# Patient Record
Sex: Male | Born: 1960 | Race: Black or African American | Hispanic: No | Smoking: Former smoker
Health system: Southern US, Community
[De-identification: ages and names within clinical notes are randomized; demographics above are authoritative.]

## PROBLEM LIST (undated history)

## (undated) DIAGNOSIS — F0390 Unspecified dementia without behavioral disturbance: Secondary | ICD-10-CM

## (undated) DIAGNOSIS — K219 Gastro-esophageal reflux disease without esophagitis: Secondary | ICD-10-CM

## (undated) DIAGNOSIS — R011 Cardiac murmur, unspecified: Secondary | ICD-10-CM

## (undated) DIAGNOSIS — I509 Heart failure, unspecified: Secondary | ICD-10-CM

## (undated) DIAGNOSIS — D649 Anemia, unspecified: Secondary | ICD-10-CM

## (undated) DIAGNOSIS — F419 Anxiety disorder, unspecified: Secondary | ICD-10-CM

## (undated) DIAGNOSIS — I639 Cerebral infarction, unspecified: Secondary | ICD-10-CM

## (undated) DIAGNOSIS — J189 Pneumonia, unspecified organism: Secondary | ICD-10-CM

## (undated) DIAGNOSIS — I10 Essential (primary) hypertension: Secondary | ICD-10-CM

## (undated) DIAGNOSIS — M1990 Unspecified osteoarthritis, unspecified site: Secondary | ICD-10-CM

## (undated) DIAGNOSIS — N189 Chronic kidney disease, unspecified: Secondary | ICD-10-CM

## (undated) DIAGNOSIS — E119 Type 2 diabetes mellitus without complications: Secondary | ICD-10-CM

## (undated) DIAGNOSIS — F32A Depression, unspecified: Secondary | ICD-10-CM

## (undated) HISTORY — PX: TONSILLECTOMY: SUR1361

## (undated) HISTORY — PX: COLONOSCOPY W/ POLYPECTOMY: SHX1380

## (undated) HISTORY — PX: OTHER SURGICAL HISTORY: SHX169

---

## 2006-01-29 ENCOUNTER — Other Ambulatory Visit: Payer: Self-pay

## 2006-01-29 ENCOUNTER — Inpatient Hospital Stay: Payer: Self-pay | Admitting: Internal Medicine

## 2006-12-03 ENCOUNTER — Ambulatory Visit: Payer: Self-pay | Admitting: Internal Medicine

## 2008-11-25 ENCOUNTER — Emergency Department: Payer: Self-pay

## 2009-01-13 ENCOUNTER — Ambulatory Visit: Payer: Self-pay

## 2009-11-06 DIAGNOSIS — I639 Cerebral infarction, unspecified: Secondary | ICD-10-CM | POA: Insufficient documentation
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
## 2014-08-01 ENCOUNTER — Emergency Department: Payer: Self-pay | Admitting: Emergency Medicine

## 2015-02-16 DIAGNOSIS — I619 Nontraumatic intracerebral hemorrhage, unspecified: Secondary | ICD-10-CM | POA: Insufficient documentation

## 2015-03-02 DIAGNOSIS — K222 Esophageal obstruction: Secondary | ICD-10-CM | POA: Insufficient documentation

## 2018-07-29 ENCOUNTER — Telehealth (INDEPENDENT_AMBULATORY_CARE_PROVIDER_SITE_OTHER): Payer: Self-pay | Admitting: Vascular Surgery

## 2021-12-09 ENCOUNTER — Observation Stay
Admission: EM | Admit: 2021-12-09 | Discharge: 2021-12-10 | Disposition: A | Discharge status: Home / Self Care | Payer: 59 | Attending: Family Medicine | Admitting: Family Medicine

## 2021-12-09 ENCOUNTER — Emergency Department: Payer: 59

## 2021-12-09 ENCOUNTER — Other Ambulatory Visit: Payer: Self-pay

## 2021-12-09 DIAGNOSIS — N1831 Chronic kidney disease, stage 3a: Secondary | ICD-10-CM

## 2021-12-09 DIAGNOSIS — I160 Hypertensive urgency: Secondary | ICD-10-CM

## 2021-12-09 DIAGNOSIS — G9341 Metabolic encephalopathy: Principal | ICD-10-CM

## 2021-12-09 DIAGNOSIS — F03918 Unspecified dementia, unspecified severity, with other behavioral disturbance: Secondary | ICD-10-CM

## 2021-12-09 DIAGNOSIS — F17210 Nicotine dependence, cigarettes, uncomplicated: Secondary | ICD-10-CM | POA: Diagnosis not present

## 2021-12-09 DIAGNOSIS — R778 Other specified abnormalities of plasma proteins: Secondary | ICD-10-CM

## 2021-12-09 DIAGNOSIS — I130 Hypertensive heart and chronic kidney disease with heart failure and stage 1 through stage 4 chronic kidney disease, or unspecified chronic kidney disease: Secondary | ICD-10-CM | POA: Insufficient documentation

## 2021-12-09 DIAGNOSIS — E1122 Type 2 diabetes mellitus with diabetic chronic kidney disease: Secondary | ICD-10-CM | POA: Diagnosis not present

## 2021-12-09 DIAGNOSIS — R1013 Epigastric pain: Secondary | ICD-10-CM | POA: Insufficient documentation

## 2021-12-09 DIAGNOSIS — R4182 Altered mental status, unspecified: Secondary | ICD-10-CM

## 2021-12-09 DIAGNOSIS — R748 Abnormal levels of other serum enzymes: Secondary | ICD-10-CM

## 2021-12-09 DIAGNOSIS — N390 Urinary tract infection, site not specified: Secondary | ICD-10-CM

## 2021-12-09 DIAGNOSIS — I5022 Chronic systolic (congestive) heart failure: Secondary | ICD-10-CM

## 2021-12-09 DIAGNOSIS — F0390 Unspecified dementia without behavioral disturbance: Secondary | ICD-10-CM

## 2021-12-09 DIAGNOSIS — Z20822 Contact with and (suspected) exposure to covid-19: Secondary | ICD-10-CM | POA: Insufficient documentation

## 2021-12-09 DIAGNOSIS — R7989 Other specified abnormal findings of blood chemistry: Secondary | ICD-10-CM

## 2021-12-09 DIAGNOSIS — R112 Nausea with vomiting, unspecified: Secondary | ICD-10-CM

## 2021-12-09 DIAGNOSIS — Z8673 Personal history of transient ischemic attack (TIA), and cerebral infarction without residual deficits: Secondary | ICD-10-CM

## 2021-12-09 HISTORY — DX: Hypertensive urgency: I16.0

## 2021-12-09 HISTORY — DX: Essential (primary) hypertension: I10

## 2021-12-09 HISTORY — DX: Type 2 diabetes mellitus without complications: E11.9

## 2021-12-09 HISTORY — DX: Unspecified dementia, unspecified severity, without behavioral disturbance, psychotic disturbance, mood disturbance, and anxiety: F03.90

## 2021-12-09 LAB — CBC WITH DIFFERENTIAL/PLATELET
Abs Immature Granulocytes: 0.05 10*3/uL (ref 0.00–0.07)
Basophils Absolute: 0.1 10*3/uL (ref 0.0–0.1)
Basophils Relative: 1 %
Eosinophils Absolute: 0 10*3/uL (ref 0.0–0.5)
Eosinophils Relative: 0 %
HCT: 41 % (ref 39.0–52.0)
Hemoglobin: 13.6 g/dL (ref 13.0–17.0)
Immature Granulocytes: 1 %
Lymphocytes Relative: 11 %
Lymphs Abs: 1.1 10*3/uL (ref 0.7–4.0)
MCH: 32.4 pg (ref 26.0–34.0)
MCHC: 33.2 g/dL (ref 30.0–36.0)
MCV: 97.6 fL (ref 80.0–100.0)
Monocytes Absolute: 0.5 10*3/uL (ref 0.1–1.0)
Monocytes Relative: 5 %
Neutro Abs: 8.7 10*3/uL — ABNORMAL HIGH (ref 1.7–7.7)
Neutrophils Relative %: 82 %
Platelets: 281 10*3/uL (ref 150–400)
RBC: 4.2 MIL/uL — ABNORMAL LOW (ref 4.22–5.81)
RDW: 12.7 % (ref 11.5–15.5)
WBC: 10.4 10*3/uL (ref 4.0–10.5)
nRBC: 0 % (ref 0.0–0.2)

## 2021-12-09 LAB — URINALYSIS, ROUTINE W REFLEX MICROSCOPIC
Bilirubin Urine: NEGATIVE
Glucose, UA: NEGATIVE mg/dL
Leukocytes,Ua: NEGATIVE
Nitrite: NEGATIVE
Protein, ur: >300 mg/dL — AB
Specific Gravity, Urine: 1.02 (ref 1.005–1.030)
pH: 7 (ref 5.0–8.0)

## 2021-12-09 LAB — COMPREHENSIVE METABOLIC PANEL
ALT: 10 U/L (ref 0–44)
AST: 17 U/L (ref 15–41)
Albumin: 4.2 g/dL (ref 3.5–5.0)
Alkaline Phosphatase: 78 U/L (ref 38–126)
Anion gap: 7 (ref 5–15)
BUN: 18 mg/dL (ref 6–20)
CO2: 27 mmol/L (ref 22–32)
Calcium: 9.3 mg/dL (ref 8.9–10.3)
Chloride: 103 mmol/L (ref 98–111)
Creatinine, Ser: 1.39 mg/dL — ABNORMAL HIGH (ref 0.61–1.24)
GFR, Estimated: 58 mL/min — ABNORMAL LOW (ref >60)
Glucose, Bld: 131 mg/dL — ABNORMAL HIGH (ref 70–99)
Potassium: 4.5 mmol/L (ref 3.5–5.1)
Sodium: 137 mmol/L (ref 135–145)
Total Bilirubin: 0.6 mg/dL (ref 0.3–1.2)
Total Protein: 8.4 g/dL — ABNORMAL HIGH (ref 6.5–8.1)

## 2021-12-09 LAB — RESP PANEL BY RT-PCR (FLU A&B, COVID) ARPGX2
Influenza A by PCR: NEGATIVE
Influenza B by PCR: NEGATIVE
SARS Coronavirus 2 by RT PCR: NEGATIVE

## 2021-12-09 LAB — URINALYSIS, MICROSCOPIC (REFLEX): RBC / HPF: >50 RBC/hpf (ref 0–5)

## 2021-12-09 LAB — HIV ANTIBODY (ROUTINE TESTING W REFLEX): HIV Screen 4th Generation wRfx: NONREACTIVE

## 2021-12-09 LAB — TROPONIN I (HIGH SENSITIVITY)
Troponin I (High Sensitivity): 29 ng/L — ABNORMAL HIGH (ref <18)
Troponin I (High Sensitivity): 30 ng/L — ABNORMAL HIGH (ref <18)

## 2021-12-09 LAB — LACTIC ACID, PLASMA
Lactic Acid, Venous: 0.9 mmol/L (ref 0.5–1.9)
Lactic Acid, Venous: 1.2 mmol/L (ref 0.5–1.9)

## 2021-12-09 LAB — LIPASE, BLOOD: Lipase: 59 U/L — ABNORMAL HIGH (ref 11–51)

## 2021-12-09 MED ORDER — SODIUM CHLORIDE 0.9 % IV SOLN
1.0000 g | INTRAVENOUS | Status: DC
  Administered 2021-12-09 – 2021-12-10 (×2): 1 g via INTRAVENOUS
  Filled 2021-12-09: qty 1
  Filled 2021-12-09: qty 10

## 2021-12-09 MED ORDER — ROSUVASTATIN CALCIUM 20 MG PO TABS
40.0000 mg | ORAL_TABLET | Freq: Every day | ORAL | Status: DC
  Administered 2021-12-09 – 2021-12-10 (×2): 40 mg via ORAL
  Filled 2021-12-09 (×2): qty 2

## 2021-12-09 MED ORDER — ACETAMINOPHEN 325 MG PO TABS: 650.0000 mg | ORAL_TABLET | Freq: Four times a day (QID) | ORAL | Status: DC | PRN

## 2021-12-09 MED ORDER — ENOXAPARIN SODIUM 40 MG/0.4ML IJ SOSY
40.0000 mg | PREFILLED_SYRINGE | INTRAMUSCULAR | Status: DC
  Administered 2021-12-09: 40 mg via SUBCUTANEOUS
  Filled 2021-12-09 (×2): qty 0.4

## 2021-12-09 MED ORDER — AMLODIPINE BESYLATE 10 MG PO TABS
10.0000 mg | ORAL_TABLET | Freq: Every day | ORAL | Status: DC
  Administered 2021-12-09 – 2021-12-10 (×2): 10 mg via ORAL
  Filled 2021-12-09 (×2): qty 1

## 2021-12-09 MED ORDER — ONDANSETRON HCL 4 MG PO TABS: 4.0000 mg | ORAL_TABLET | Freq: Four times a day (QID) | ORAL | Status: DC | PRN

## 2021-12-09 MED ORDER — TAMSULOSIN HCL 0.4 MG PO CAPS
0.4000 mg | ORAL_CAPSULE | Freq: Every day | ORAL | Status: DC
  Administered 2021-12-09 – 2021-12-10 (×2): 0.4 mg via ORAL
  Filled 2021-12-09 (×2): qty 1

## 2021-12-09 MED ORDER — MEMANTINE HCL 5 MG PO TABS
10.0000 mg | ORAL_TABLET | Freq: Two times a day (BID) | ORAL | Status: DC
  Administered 2021-12-09: 10 mg via ORAL
  Filled 2021-12-09 (×2): qty 2

## 2021-12-09 MED ORDER — ONDANSETRON HCL 4 MG/2ML IJ SOLN
4.0000 mg | Freq: Four times a day (QID) | INTRAMUSCULAR | Status: DC | PRN
  Administered 2021-12-09: 4 mg via INTRAVENOUS
  Filled 2021-12-09: qty 2

## 2021-12-09 MED ORDER — SODIUM CHLORIDE 0.9 % IV SOLN: INTRAVENOUS | Status: AC

## 2021-12-09 MED ORDER — HYDRALAZINE HCL 20 MG/ML IJ SOLN
10.0000 mg | Freq: Four times a day (QID) | INTRAMUSCULAR | Status: DC | PRN
  Administered 2021-12-09: 10 mg via INTRAVENOUS
  Filled 2021-12-09: qty 1

## 2021-12-09 MED ORDER — ASPIRIN EC 81 MG PO TBEC
81.0000 mg | DELAYED_RELEASE_TABLET | Freq: Every day | ORAL | Status: DC
  Administered 2021-12-09 – 2021-12-10 (×2): 81 mg via ORAL
  Filled 2021-12-09 (×2): qty 1

## 2021-12-09 MED ORDER — SERTRALINE HCL 50 MG PO TABS
100.0000 mg | ORAL_TABLET | Freq: Every day | ORAL | Status: DC
  Administered 2021-12-09 – 2021-12-10 (×2): 100 mg via ORAL
  Filled 2021-12-09 (×2): qty 2

## 2021-12-09 MED ORDER — SODIUM CHLORIDE 0.9 % IV BOLUS
500.0000 mL | Freq: Once | INTRAVENOUS | Status: AC
  Administered 2021-12-09: 500 mL via INTRAVENOUS

## 2021-12-09 MED ORDER — ACETAMINOPHEN 650 MG RE SUPP: 650.0000 mg | Freq: Four times a day (QID) | RECTAL | Status: DC | PRN

## 2021-12-09 NOTE — Assessment & Plan Note (Addendum)
--   Resolved.  Etiology unclear.  CT abdomen and pelvis unremarkable.  Mild elevation of lipase clinically insignificant.  May have been secondary to UTI.  Tolerating diet.

## 2021-12-09 NOTE — Assessment & Plan Note (Addendum)
--   Last echocardiogram on file 2021 in [place] showed recovered LVEF of 50%. --Continue Coreg, amlodipine.  Continue Entresto  -- Follows at [facility]

## 2021-12-09 NOTE — Assessment & Plan Note (Addendum)
--   Secondary to acute UTI, vomiting superimposed on suspected dementia and recently started on Namenda.  CT head no acute findings.  Per sister patient back to baseline.  No further evaluation suggested.

## 2021-12-09 NOTE — Plan of Care (Signed)
°  Problem: Clinical Measurements: Goal: Ability to maintain clinical measurements within normal limits will improve Outcome: Progressing   Problem: Clinical Measurements: Goal: Cardiovascular complication will be avoided Outcome: Progressing   Problem: Activity: Goal: Risk for activity intolerance will decrease Outcome: Progressing   Problem: Pain Managment: Goal: General experience of comfort will improve Outcome: Progressing   Problem: Safety: Goal: Ability to remain free from injury will improve Outcome: Progressing

## 2021-12-09 NOTE — Assessment & Plan Note (Addendum)
--   Reviewed last PCP note, blood pressure somewhat difficult to control in the setting of orthostatic hypotension, treated with abdominal binder, adherence to medications also question.  Blood pressure fairly well controlled here, no indication for further inpatient treatment.   --Continue amlodipine, Coreg, Entresto.  Sister plans to buy blood pressure cuff and follow as an outpatient.

## 2021-12-09 NOTE — ED Notes (Signed)
Hospitalist at the bedside 

## 2021-12-09 NOTE — Assessment & Plan Note (Addendum)
--   Cognitive decline noticed by PCP, started on Namenda with plan to titrate up to final dose of 10 mg twice daily.  Suspect dementia.  Follow-up as an outpatient.

## 2021-12-09 NOTE — Assessment & Plan Note (Addendum)
--  Continue aspirin and rosuvastatin

## 2021-12-09 NOTE — Assessment & Plan Note (Addendum)
Uncontrolled Hydralazine IV as needed until tolerating orally

## 2021-12-09 NOTE — Assessment & Plan Note (Addendum)
--   Presumably secondary to vomiting.  No repeat indicated at this point as the patient is asymptomatic and examined tolerating diet.  Imaging was unremarkable, no evidence of pancreatitis.

## 2021-12-09 NOTE — Assessment & Plan Note (Addendum)
--   Minimally elevated, EKG nonacute.  Asymptomatic.  Of note elevated on last check in the emergency department per care everywhere.  May be chronic elevation.  No signs or symptoms to suggest ACS.

## 2021-12-09 NOTE — ED Provider Notes (Signed)
[facility] Provider Note    Event Date/Time   First MD Initiated Contact with Patient 12/09/21 0107     (approximate)   History   Emesis and Altered Mental Status   HPI  Level V caveat: Limited by dementia  Randall [name] is a 61 y.o. male brought to the ED via EMS from home with a chief complaint of emesis and altered mentation.  Patient with a history of dementia.  Wife made him an egg sandwich and became concerned when he vomited and began to "talk out of his head".  Rest of history is unobtainable secondary to patient's dementia as he denies all complaints.     Past Medical History  1. Chronic systolic heart failure (CMS-HCC)  2. Essential hypertension (RAF-HCC)  3. Cognitive decline  4. Frailty  5. Lower urinary tract symptoms  6. Orthostasis    Active Problem List   Patient Active Problem List   Diagnosis Date Noted   Dementia ([place]) 24/26/8341   Acute metabolic encephalopathy 96/22/2979   Nausea and vomiting 12/09/2021   Elevated troponin 89/21/1941   Chronic systolic CHF (congestive heart failure) ([place]) 12/09/2021   History of CVA (cerebrovascular accident) 12/09/2021   Stage 3a chronic kidney disease ([place]) 12/09/2021   Elevated lipase 12/09/2021   Hypertensive urgency 12/09/2021     Past Surgical History     Home Medications   Prior to Admission medications   Not on File     Allergies  Patient has no known allergies.   Family History  History reviewed. No pertinent family history.   Physical Exam  Triage Vital Signs: ED Triage Vitals  Enc Vitals Group     BP      Pulse      Resp      Temp      Temp src      SpO2      Weight      Height      Head Circumference      Peak Flow      Pain Score      Pain Loc      Pain Edu?      Excl. in [place]?     Updated Vital Signs: BP (!) 156/83    Pulse 71    Temp 98.3 F (36.8 C) (Oral)    Resp 18    Ht 6' (1.829 m)    Wt 74.9 kg    SpO2 98%    BMI 22.39 kg/m     General: Awake, no distress.  CV:  RRR.  Good peripheral perfusion.  Resp:  Normal effort.  CTAB. Abd:  Mildly tender to epigastrium without rebound or guarding no distention.  Other:  Alert and oriented to name.  CN II to XII grossly intact. MAEx4.   ED Results / Procedures / Treatments  Labs (all labs ordered are listed, but only abnormal results are displayed) Labs Reviewed  CBC WITH DIFFERENTIAL/PLATELET - Abnormal; Notable for the following components:      Result Value   RBC 4.20 (*)    Neutro Abs 8.7 (*)    All other components within normal limits  COMPREHENSIVE METABOLIC PANEL - Abnormal; Notable for the following components:   Glucose, Bld 131 (*)    Creatinine, Ser 1.39 (*)    Total Protein 8.4 (*)    GFR, Estimated 58 (*)    All other components within normal limits  LIPASE, BLOOD - Abnormal; Notable for the  following components:   Lipase 59 (*)    All other components within normal limits  URINALYSIS, ROUTINE W REFLEX MICROSCOPIC - Abnormal; Notable for the following components:   Color, Urine AMBER (*)    Hgb urine dipstick SMALL (*)    Ketones, ur TRACE (*)    Protein, ur >300 (*)    All other components within normal limits  URINALYSIS, MICROSCOPIC (REFLEX) - Abnormal; Notable for the following components:   Bacteria, UA MANY (*)    All other components within normal limits  TROPONIN I (HIGH SENSITIVITY) - Abnormal; Notable for the following components:   Troponin I (High Sensitivity) 29 (*)    All other components within normal limits  TROPONIN I (HIGH SENSITIVITY) - Abnormal; Notable for the following components:   Troponin I (High Sensitivity) 30 (*)    All other components within normal limits  RESP PANEL BY RT-PCR (FLU A&B, COVID) ARPGX2  CULTURE, BLOOD (ROUTINE X 2)  CULTURE, BLOOD (ROUTINE X 2)  URINE CULTURE  LACTIC ACID, PLASMA  LACTIC ACID, PLASMA  HIV ANTIBODY (ROUTINE TESTING W REFLEX)     EKG  ED ECG REPORT I, Kentaro Alewine J, the  attending physician, personally viewed and interpreted this ECG.   Date: 12/09/2021  EKG Time: 0114  Rate: 62  Rhythm: normal sinus rhythm  Axis: Normal  Intervals:none  ST&T Change: Nonspecific    RADIOLOGY I have personally reviewed patient's CT head, CT abdomen pelvis and chest x-ray as well as the radiology interpretation:  CT head: No ICH  CT abdomen pelvis: No acute abnormality  Chest x-ray: No acute cardiopulmonary process  Official radiology report(s): CT Abdomen Pelvis Wo Contrast  Result Date: 12/09/2021 CLINICAL DATA:  Nausea and vomiting EXAM: CT ABDOMEN AND PELVIS WITHOUT CONTRAST TECHNIQUE: Multidetector CT imaging of the abdomen and pelvis was performed following the standard protocol without IV contrast. RADIATION DOSE REDUCTION: This exam was performed according to the departmental dose-optimization program which includes automated exposure control, adjustment of the mA and/or kV according to patient size and/or use of iterative reconstruction technique. COMPARISON:  None FINDINGS: Lower chest: No acute abnormality. Hepatobiliary: No focal liver abnormality is seen. No gallstones, gallbladder wall thickening, or biliary dilatation. Pancreas: Unremarkable. No pancreatic ductal dilatation or surrounding inflammatory changes. Spleen: Normal in size without focal abnormality. Adrenals/Urinary Tract: Mild hyperplasia of the adrenal glands is noted. The kidneys are well visualize without renal calculi or urinary tract obstructive changes. The bladder is well distended. Stomach/Bowel: Scattered fecal material is noted throughout the colon. No obstructive changes are seen. The appendix is not well appreciated. No inflammatory changes to suggest appendicitis are noted. Small bowel and stomach are within normal limits. Vascular/Lymphatic: Aortic atherosclerosis. No enlarged abdominal or pelvic lymph nodes. Reproductive: Prostate is unremarkable. Large left hydrocele is noted. Other: No  abdominal wall hernia or abnormality. No abdominopelvic ascites. Musculoskeletal: Bony structures show no acute abnormality. Degenerative changes of lumbar spine are noted. IMPRESSION: No acute abnormality to correspond with the given clinical history. Large left hydrocele. Electronically Signed   By: Inez Catalina M.D.   On: 12/09/2021 02:47   CT Head Wo Contrast  Result Date: 12/09/2021 CLINICAL DATA:  Vomiting and altered mental status, initial encounter EXAM: CT HEAD WITHOUT CONTRAST TECHNIQUE: Contiguous axial images were obtained from the base of the skull through the vertex without intravenous contrast. RADIATION DOSE REDUCTION: This exam was performed according to the departmental dose-optimization program which includes automated exposure control, adjustment of the mA and/or kV  according to patient size and/or use of iterative reconstruction technique. COMPARISON:  None. FINDINGS: Brain: No evidence of acute infarction, hemorrhage, hydrocephalus, extra-axial collection or mass lesion/mass effect. Chronic white matter ischemic changes are noted. Vascular: No hyperdense vessel or unexpected calcification. Skull: Normal. Negative for fracture or focal lesion. Sinuses/Orbits: No acute finding. Other: None. IMPRESSION: Chronic white matter ischemic changes without acute abnormality. Electronically Signed   By: Inez Catalina M.D.   On: 12/09/2021 02:27   DG Chest Port 1 View  Result Date: 12/09/2021 CLINICAL DATA:  Altered mental status, weakness EXAM: PORTABLE CHEST 1 VIEW COMPARISON:  01/29/2006 FINDINGS: Lungs are clear.  No pleural effusion or pneumothorax. The heart is normal in size. IMPRESSION: No evidence of acute cardiopulmonary disease. Electronically Signed   By: Julian Hy M.D.   On: 12/09/2021 01:48     PROCEDURES:  Critical Care performed: No  .1-3 Lead EKG Interpretation Performed by: Paulette Blanch, MD Authorized by: Paulette Blanch, MD     Interpretation: normal     ECG rate:   60   ECG rate assessment: normal     Rhythm: sinus rhythm     Ectopy: none     Conduction: normal   Comments:     Patient placed on cardiac monitor to evaluate for arrhythmias   MEDICATIONS ORDERED IN ED: Medications  hydrALAZINE (APRESOLINE) injection 10 mg (10 mg Intravenous Given 12/09/21 0528)  enoxaparin (LOVENOX) injection 40 mg (has no administration in time range)  0.9 %  sodium chloride infusion ( Intravenous New Bag/Given 12/09/21 0636)  acetaminophen (TYLENOL) tablet 650 mg (has no administration in time range)    Or  acetaminophen (TYLENOL) suppository 650 mg (has no administration in time range)  ondansetron (ZOFRAN) tablet 4 mg ( Oral See Alternative 12/09/21 1517)    Or  ondansetron (ZOFRAN) injection 4 mg (4 mg Intravenous Given 12/09/21 0628)  sodium chloride 0.9 % bolus 500 mL (0 mLs Intravenous Stopped 12/09/21 0251)     IMPRESSION / MDM / ASSESSMENT AND PLAN / ED COURSE  I reviewed the triage vital signs and the nursing notes.                             61 year old male presenting with emesis and altered mental status. Differential diagnosis includes, but is not limited to, alcohol, illicit or prescription medications, or other toxic ingestion; intracranial pathology such as stroke or intracerebral hemorrhage; fever or infectious causes including sepsis; hypoxemia and/or hypercarbia; uremia; trauma; endocrine related disorders such as diabetes, hypoglycemia, and thyroid-related diseases; hypertensive encephalopathy; etc. I have personally reviewed patient's records and see that he had a PCP office visit yesterday 12/08/2021 which notes he stopped spironolactone 06/2021 for dizziness, home safety eval with PT/OT ordered given frailty, referred to neuropsych testing for cognitive decline, started on Namenda 5 mg bid  The patient is on the cardiac monitor to evaluate for evidence of arrhythmia and/or significant heart rate changes.  We will obtain sepsis work-up including  troponin, CT head for altered mental status, CT abdomen/pelvis for emesis, chest x-ray to evaluate pneumonia.  Will reassess.  Clinical Course as of 12/09/21 6160  Fri Dec 09, 2021  0303 Wife at bedside who states patient is still more confused from his baseline.  Reports he had a scheduled PCP follow-up yesterday where they adjusted some of his medications.  Came home and she fixed him a bologna and tried cheese sandwich.  Approximately  1 hour later he began to vomit.  Subsequently he seemed confused and talking out of his head.  Updated them of laboratory and imaging results which are notable for normal WBC 10.4, creatinine 1.39 without prior for comparison, mildly elevated lipase 59, negative respiratory panel, negative lactic acid, mildly elevated troponin which is likely secondary to demand ischemia.  CT head, abdomen/pelvis and chest x-ray unremarkable.  Will obtain in/out catheter urine.  Will consult hospitalist services for evaluation and admission for altered mental status. [JS]    Clinical Course User Index [JS] Paulette Blanch, MD     FINAL CLINICAL IMPRESSION(S) / ED DIAGNOSES   Final diagnoses:  Altered mental status, unspecified altered mental status type  Nausea and vomiting, unspecified vomiting type     Rx / DC Orders   ED Discharge Orders     None        Note:  This document was prepared using Dragon voice recognition software and may include unintentional dictation errors.   Paulette Blanch, MD 12/09/21 [phone]

## 2021-12-09 NOTE — H&P (Signed)
History and Physical    Patient: Randall [name]:[id number] DOB: [birth year] DOA: 12/09/2021 DOS: the patient was seen and examined on 12/09/2021 PCP: System, Provider Not In  Patient coming from: Home  Chief Complaint:  Chief Complaint  Patient presents with   Emesis   Altered Mental Status    HPI: Randall [name] is a 61 y.o. male with medical history significant of Dementia, HTN, systolic heart failure with improved EF to 50%, CKD 3a and history of CVA who presents to the ED with a complaint of vomiting and talking out of his head.  Most of the history is given by his sister and caregiver at the bedside who states that the symptoms started after eating a bologna sandwich that she prepared.  She stated that after he ate the sandwich she left the house for about an hour and when she returned he was in the bathroom vomiting and he was speaking incomprehensibly which is not usual for him even with dementia.  Vomiting was nonbloody and nonbilious.  No affected contacts.  He was not previously unwell and saw his PCP the day prior.  She states he was tested for COVID and flu because she mentioned nonacute been sneezing for couple days both test were negative.  Patient denies chest pain, shortness of breath, abdominal pain or diarrhea  ED course: BP 180/84 with otherwise normal vitals Labs: CBC unremarkable, lactic acid 0.9 CMP with creatinine 1.39 which is baseline.  LFTs normal, lipase 59 Troponin 29 COVID and flu negative Urinalysis pending  EKG, personally reviewed and interpreted: Sinus at 62 with nonspecific ST-T wave changes  Imaging CT abdomen and pelvis large left hydrocele but otherwise no abnormalities Chest x-ray clear CT head nonacute  Patient given an IV fluid bolus.  Blood and urine cultures sent of.  Hospitalist consulted for admission.    Review of Systems: As mentioned in the history of present illness. All other systems reviewed and are negative. Past Medical  History:  Diagnosis Date   Dementia ([place])    Diabetes mellitus without complication ([place])    Hypertension    History reviewed. No pertinent surgical history. Social History:  reports that he has been smoking cigarettes. He has been smoking an average of .5 packs per day. He does not have any smokeless tobacco history on file. He reports that he does not drink alcohol and does not use drugs.  No Known Allergies  History reviewed. No pertinent family history.  Prior to Admission medications   Not on File    Physical Exam: Vitals:   12/09/21 0119 12/09/21 0130 12/09/21 0230 12/09/21 0300  BP:  (!) 196/97 (!) 178/87 (!) 180/84  Pulse:   61 61  Resp:  13 15 13   Temp:      TempSrc:      SpO2:   100% 100%  Weight: 95.3 kg     Height: 6' (1.829 m)       Physical Exam Constitutional:      General: He is not in acute distress.    Appearance: Normal appearance. He is not ill-appearing.  HENT:     Head: Normocephalic and atraumatic.  Cardiovascular:     Rate and Rhythm: Normal rate and regular rhythm.  Pulmonary:     Effort: Pulmonary effort is normal.     Breath sounds: Normal breath sounds.  Abdominal:     General: Abdomen is flat. Bowel sounds are normal.     Palpations: Abdomen is soft.  Musculoskeletal:  Cervical back: Normal range of motion and neck supple.  Skin:    General: Skin is warm and dry.  Neurological:     General: No focal deficit present.     Mental Status: Mental status is at baseline.  Psychiatric:        Mood and Affect: Mood normal.        Behavior: Behavior normal.     Data Reviewed: Vitals, labs, EKG, imaging ED provider and triage notes Past cardiology visit Notes       Assessment and Plan: * Acute metabolic encephalopathy Secondary to illness superimposed on baseline dementia Head CT with no acute findings Neurologic checks Fall and aspiration precautions   Hypertensive urgency Hydralazine IV as needed Resume oral meds as  tolerated.  On amlodipine, Coreg, Entresto  Elevated lipase Possibly reactive to vomiting Continue to trend  Nausea and vomiting Mildly elevated lipase with otherwise no abnormal findings Follow urinalysis Possible mild viral illness, possible food related illness IV hydration IV antiemetics  Elevated troponin Patient denies chest pain and EKG nonacute Likely related to demand from vomiting Continue to trend  Stage 3a chronic kidney disease ([place]) Renal function at baseline  History of CVA (cerebrovascular accident) Continue aspirin and rosuvastatin  Chronic systolic CHF (congestive heart failure) ([place]) Compensated Monitor for worsening IV hydration Continue Coreg, amlodipine.  Continue Entresto when verified [facility]  Dementia [facility]) Patient not currently on medication for dementia Delirium precautions       Advance Care Planning:   Code Status: Not on file full code  Consults: None  Family Communication: Sister at bedside  Severity of Illness: The appropriate patient status for this patient is OBSERVATION. Observation status is judged to be reasonable and necessary in order to provide the required intensity of service to ensure the patient's safety. The patient's presenting symptoms, physical exam findings, and initial radiographic and laboratory data in the context of their medical condition is felt to place them at decreased risk for further clinical deterioration. Furthermore, it is anticipated that the patient will be medically stable for discharge from the hospital within 2 midnights of admission.   Author: Athena Masse, MD 12/09/2021 3:38 AM  For on call review [email or web address].

## 2021-12-09 NOTE — ED Triage Notes (Signed)
Patient arrives via EMS from home with c/c of vomiting and altered mental status. Sister states he ate a bologna sandwich and then vomited forcefully. She also states that he has been "talking out of his head" since yesterday. Pt has a HX of Dementia. No active vomiting during triage. Pt does have difficulty getting his words out, but sister states this is not a new problem. Pt follows commands, although it is difficult to understand some of what he is saying. No other complaints.

## 2021-12-09 NOTE — Progress Notes (Signed)
EZZARD DITMER is a 61 y.o. male with medical history significant of Dementia, HTN, systolic heart failure with improved EF to 50%, CKD 3a and history of CVA who presents to the ED with a complaint of vomiting and talking out of his head.  Most of the history is given by his sister and caregiver at the bedside who states that the symptoms started after eating a bologna sandwich that she prepared.  She stated that after he ate the sandwich she left the house for about an hour and when she returned he was in the bathroom vomiting and he was speaking incomprehensibly which is not usual for him even with dementia.  Vomiting was nonbloody and nonbilious.  No affected contacts.  He was not previously unwell and saw his PCP the day prior.  She states he was tested for COVID and flu because she mentioned nonacute been sneezing for couple days both test were negative.  Patient denies chest pain, shortness of breath, abdominal pain or diarrhea.  12/09/2021: Patient was seen and examined at his bedside.  He is alert and oriented x1, to self only.  He is pleasantly confused and has nonsensical speech.  CT head was negative for any acute intracranial findings.  UA positive for pyuria, urine culture in process, started Rocephin empirically due to altered mental status.  Please refer to H&P dictated by my partner Dr. Damita Dunnings on 12/09/2021 for further details of the assessment and plan.

## 2021-12-09 NOTE — Assessment & Plan Note (Addendum)
--  Renal function at baseline --1.39 on admission, 1.62 today.  Baseline approximately 1.5-1.7 with most recent study 08/18/2021.

## 2021-12-09 NOTE — Progress Notes (Addendum)
Pt was admitted on the floor with no sign of distress. Pt only alert to self. Pt screening not completed due to confusion. VSS except BP 178/95 HR 72. Hydralazine 10 mg IV PRN administered. Will notify incoming shift. Will continue to monitor.

## 2021-12-10 DIAGNOSIS — F03A0 Unspecified dementia, mild, without behavioral disturbance, psychotic disturbance, mood disturbance, and anxiety: Secondary | ICD-10-CM

## 2021-12-10 DIAGNOSIS — N3001 Acute cystitis with hematuria: Secondary | ICD-10-CM

## 2021-12-10 DIAGNOSIS — R112 Nausea with vomiting, unspecified: Secondary | ICD-10-CM

## 2021-12-10 DIAGNOSIS — N390 Urinary tract infection, site not specified: Secondary | ICD-10-CM

## 2021-12-10 DIAGNOSIS — N1831 Chronic kidney disease, stage 3a: Secondary | ICD-10-CM

## 2021-12-10 LAB — CBC
HCT: 38.2 % — ABNORMAL LOW (ref 39.0–52.0)
Hemoglobin: 12.7 g/dL — ABNORMAL LOW (ref 13.0–17.0)
MCH: 31.5 pg (ref 26.0–34.0)
MCHC: 33.2 g/dL (ref 30.0–36.0)
MCV: 94.8 fL (ref 80.0–100.0)
Platelets: 253 10*3/uL (ref 150–400)
RBC: 4.03 MIL/uL — ABNORMAL LOW (ref 4.22–5.81)
RDW: 12.5 % (ref 11.5–15.5)
WBC: 7.4 10*3/uL (ref 4.0–10.5)
nRBC: 0 % (ref 0.0–0.2)

## 2021-12-10 LAB — COMPREHENSIVE METABOLIC PANEL
ALT: 8 U/L (ref 0–44)
AST: 14 U/L — ABNORMAL LOW (ref 15–41)
Albumin: 3.3 g/dL — ABNORMAL LOW (ref 3.5–5.0)
Alkaline Phosphatase: 63 U/L (ref 38–126)
Anion gap: 6 (ref 5–15)
BUN: 19 mg/dL (ref 6–20)
CO2: 25 mmol/L (ref 22–32)
Calcium: 8.7 mg/dL — ABNORMAL LOW (ref 8.9–10.3)
Chloride: 104 mmol/L (ref 98–111)
Creatinine, Ser: 1.62 mg/dL — ABNORMAL HIGH (ref 0.61–1.24)
GFR, Estimated: 48 mL/min — ABNORMAL LOW (ref >60)
Glucose, Bld: 83 mg/dL (ref 70–99)
Potassium: 3.3 mmol/L — ABNORMAL LOW (ref 3.5–5.1)
Sodium: 135 mmol/L (ref 135–145)
Total Bilirubin: 0.6 mg/dL (ref 0.3–1.2)
Total Protein: 6.8 g/dL (ref 6.5–8.1)

## 2021-12-10 LAB — URINE CULTURE: Culture: 30000 — AB

## 2021-12-10 LAB — PHOSPHORUS: Phosphorus: 2.8 mg/dL (ref 2.5–4.6)

## 2021-12-10 LAB — MAGNESIUM: Magnesium: 2 mg/dL (ref 1.7–2.4)

## 2021-12-10 MED ORDER — MEMANTINE HCL 5 MG PO TABS: ORAL_TABLET | ORAL | 0 refills | Status: DC

## 2021-12-10 MED ORDER — POTASSIUM CHLORIDE CRYS ER 20 MEQ PO TBCR
40.0000 meq | EXTENDED_RELEASE_TABLET | Freq: Once | ORAL | Status: AC
Start: 2021-12-10 — End: 2021-12-10
  Administered 2021-12-10: 10:00:00 40 meq via ORAL
  Filled 2021-12-10: qty 2

## 2021-12-10 MED ORDER — CEFUROXIME AXETIL 500 MG PO TABS: 500.0000 mg | ORAL_TABLET | Freq: Two times a day (BID) | ORAL | 0 refills | Status: DC

## 2021-12-10 NOTE — Discharge Summary (Addendum)
Physician Discharge Summary   Patient: Randall [name] MRN: [id number] DOB: 18-Jun-1961  Admit date:     12/09/2021  Discharge date: 12/10/21  Discharge Physician: Murray Hodgkins   PCP: Marlowe Aschoff, MD   Recommendations at discharge:   Follow-up resolution of UTI  Discharge Diagnoses: Principal Problem:   Acute metabolic encephalopathy Active Problems:   UTI (urinary tract infection)   Dementia (HCC)   Nausea and vomiting   Elevated troponin   Chronic systolic CHF (congestive heart failure) (HCC)   History of CVA (cerebrovascular accident)   Stage 3a chronic kidney disease (HCC)   Elevated lipase   Hypertensive urgency  Resolved Problems:   * No resolved hospital problems. *  Hospital Course: 61 year old man presented with acute confusion and emesis superimposed on dementia.  Sister had noted he had been confused since the doctor's office visit earlier in the day.  Admitted for acute metabolic encephalopathy, further history revealed increased urinary frequency and testing suggested UTI.  Treated for symptomatic UTI with clinical resolution of encephalopathy.  Back to baseline today and stable for discharge home.  Assessment and Plan: * Acute metabolic encephalopathy -- Secondary to acute UTI, vomiting superimposed on suspected dementia and recently started on Namenda.  CT head no acute findings.  Per sister patient back to baseline.  No further evaluation suggested.    UTI (urinary tract infection) -- Clinically resolved.  Increased urinary frequency recently thought to be BPH.  Complete 3-day course of antibiotic.  Hypertensive urgency -- Reviewed last PCP note, blood pressure somewhat difficult to control in the setting of orthostatic hypotension, treated with abdominal binder, adherence to medications also question.  Blood pressure fairly well controlled here, no indication for further inpatient treatment.   --Continue amlodipine, Coreg, Entresto.  Sister  plans to buy blood pressure cuff and follow as an outpatient.  Elevated lipase -- Presumably secondary to vomiting.  No repeat indicated at this point as the patient is asymptomatic and examined tolerating diet.  Imaging was unremarkable, no evidence of pancreatitis.  Stage 3a chronic kidney disease ([place]) --Renal function at baseline --1.39 on admission, 1.62 today.  Baseline approximately 1.5-1.7 with most recent study 08/18/2021.  History of CVA (cerebrovascular accident) --Continue aspirin and rosuvastatin  Chronic systolic CHF (congestive heart failure) ([place]) -- Last echocardiogram on file 2021 in [place] showed recovered LVEF of 50%. --Continue Coreg, amlodipine.  Continue Entresto  -- Follows at [facility]  Elevated troponin -- Minimally elevated, EKG nonacute.  Asymptomatic.  Of note elevated on last check in the emergency department per care everywhere.  May be chronic elevation.  No signs or symptoms to suggest ACS.   Nausea and vomiting -- Resolved.  Etiology unclear.  CT abdomen and pelvis unremarkable.  Mild elevation of lipase clinically insignificant.  May have been secondary to UTI.  Tolerating diet.  Dementia ([place]) -- Cognitive decline noticed by PCP, started on Namenda with plan to titrate up to final dose of 10 mg twice daily.  Suspect dementia.  Follow-up as an outpatient.           Consultants: none  Procedures performed: none  Disposition: Home Diet recommendation:  Discharge Diet Orders (From admission, onward)     Start     Ordered   12/10/21 0000  Diet - low sodium heart healthy        12/10/21 0943           Regular diet  DISCHARGE MEDICATION: Allergies as of 12/10/2021  Reactions   Penicillins    Reaction in childhood        Medication List     STOP taking these medications    lisinopril 40 MG tablet Commonly known as: ZESTRIL       TAKE these medications    amLODipine 10 MG tablet Commonly known as: NORVASC Take  10 mg by mouth daily.   aspirin 81 MG EC tablet Take 81 mg by mouth daily.   carvedilol 25 MG tablet Commonly known as: COREG Take 25 mg by mouth 2 (two) times daily.   cefUROXime 500 MG tablet Commonly known as: CEFTIN Take 1 tablet (500 mg total) by mouth 2 (two) times daily with a meal. Start 2/5 AM   memantine 5 MG tablet Commonly known as: NAMENDA Take as directed by your doctor. What changed:  medication strength how much to take how to take this when to take this additional instructions   rosuvastatin 40 MG tablet Commonly known as: CRESTOR Take 40 mg by mouth daily.   sacubitril-valsartan 97-103 MG Commonly known as: ENTRESTO Take 1 tablet by mouth 2 (two) times daily.   sertraline 100 MG tablet Commonly known as: ZOLOFT Take 100 mg by mouth daily.   tamsulosin 0.4 MG Caps capsule Commonly known as: FLOMAX Take 0.4 mg by mouth daily.        Follow-up Information     Pavlovich, Jae Dire, MD. Schedule an appointment as soon as possible for a visit in 1 week(s).   Specialty: Internal Medicine Contact information: [address] [place] [phone]                1 episode of vomiting yesterday but did tolerate dinner last night.  Sister feels like he is back to baseline today.  No abdominal pain or vomiting.  Chronic left shoulder pain. Discharge Exam: Filed Weights   12/09/21 0119 12/09/21 0451  Weight: 95.3 kg 74.9 kg   Physical Exam Constitutional:      General: He is not in acute distress.    Appearance: He is not ill-appearing or toxic-appearing.  Cardiovascular:     Rate and Rhythm: Normal rate and regular rhythm.     Heart sounds: No murmur heard. Pulmonary:     Effort: Pulmonary effort is normal. No respiratory distress.     Breath sounds: No wheezing, rhonchi or rales.  Neurological:     Mental Status: He is alert.  Psychiatric:        Mood and Affect: Mood normal.        Behavior: Behavior normal.   Alert, answers questions appropriately   Condition at discharge: good  The results of significant diagnostics from this hospitalization (including imaging, microbiology, ancillary and laboratory) are listed below for reference.   Imaging Studies: CT Abdomen Pelvis Wo Contrast  Result Date: 12/09/2021 CLINICAL DATA:  Nausea and vomiting EXAM: CT ABDOMEN AND PELVIS WITHOUT CONTRAST TECHNIQUE: Multidetector CT imaging of the abdomen and pelvis was performed following the standard protocol without IV contrast. RADIATION DOSE REDUCTION: This exam was performed according to the departmental dose-optimization program which includes automated exposure control, adjustment of the mA and/or kV according to patient size and/or use of iterative reconstruction technique. COMPARISON:  None FINDINGS: Lower chest: No acute abnormality. Hepatobiliary: No focal liver abnormality is seen. No gallstones, gallbladder wall thickening, or biliary dilatation. Pancreas: Unremarkable. No pancreatic ductal dilatation or surrounding inflammatory changes. Spleen: Normal in size without focal abnormality. Adrenals/Urinary Tract: Mild hyperplasia of  the adrenal glands is noted. The kidneys are well visualize without renal calculi or urinary tract obstructive changes. The bladder is well distended. Stomach/Bowel: Scattered fecal material is noted throughout the colon. No obstructive changes are seen. The appendix is not well appreciated. No inflammatory changes to suggest appendicitis are noted. Small bowel and stomach are within normal limits. Vascular/Lymphatic: Aortic atherosclerosis. No enlarged abdominal or pelvic lymph nodes. Reproductive: Prostate is unremarkable. Large left hydrocele is noted. Other: No abdominal wall hernia or abnormality. No abdominopelvic ascites. Musculoskeletal: Bony structures show no acute abnormality. Degenerative changes of lumbar spine are noted. IMPRESSION: No acute abnormality to correspond with the  given clinical history. Large left hydrocele. Electronically Signed   By: Inez Catalina M.D.   On: 12/09/2021 02:47   CT Head Wo Contrast  Result Date: 12/09/2021 CLINICAL DATA:  Vomiting and altered mental status, initial encounter EXAM: CT HEAD WITHOUT CONTRAST TECHNIQUE: Contiguous axial images were obtained from the base of the skull through the vertex without intravenous contrast. RADIATION DOSE REDUCTION: This exam was performed according to the departmental dose-optimization program which includes automated exposure control, adjustment of the mA and/or kV according to patient size and/or use of iterative reconstruction technique. COMPARISON:  None. FINDINGS: Brain: No evidence of acute infarction, hemorrhage, hydrocephalus, extra-axial collection or mass lesion/mass effect. Chronic white matter ischemic changes are noted. Vascular: No hyperdense vessel or unexpected calcification. Skull: Normal. Negative for fracture or focal lesion. Sinuses/Orbits: No acute finding. Other: None. IMPRESSION: Chronic white matter ischemic changes without acute abnormality. Electronically Signed   By: Inez Catalina M.D.   On: 12/09/2021 02:27   DG Chest Port 1 View  Result Date: 12/09/2021 CLINICAL DATA:  Altered mental status, weakness EXAM: PORTABLE CHEST 1 VIEW COMPARISON:  01/29/2006 FINDINGS: Lungs are clear.  No pleural effusion or pneumothorax. The heart is normal in size. IMPRESSION: No evidence of acute cardiopulmonary disease. Electronically Signed   By: Julian Hy M.D.   On: 12/09/2021 01:48    Microbiology: Results for orders placed or performed during the hospital encounter of 12/09/21  Culture, blood (routine x 2)     Status: None (Preliminary result)   Collection Time: 12/09/21  1:26 AM   Specimen: BLOOD  Result Value Ref Range Status   Specimen Description BLOOD LEFT ASSIST CONTROL  Final   Special Requests   Final    BOTTLES DRAWN AEROBIC AND ANAEROBIC Blood Culture results may not be  optimal due to an excessive volume of blood received in culture bottles   Culture   Final    NO GROWTH 1 DAY Performed at [facility], [address], [place], [place] [address]    Report Status PENDING  Incomplete  Culture, blood (routine x 2)     Status: None (Preliminary result)   Collection Time: 12/09/21  1:26 AM   Specimen: BLOOD  Result Value Ref Range Status   Specimen Description BLOOD RIGHT ASSIST CONTROL  Final   Special Requests   Final    BOTTLES DRAWN AEROBIC AND ANAEROBIC Blood Culture adequate volume   Culture   Final    NO GROWTH 1 DAY Performed at [facility], [address], [place], [place] [address]    Report Status PENDING  Incomplete  Resp Panel by RT-PCR (Flu A&B, Covid) Nasopharyngeal Swab     Status: None   Collection Time: 12/09/21  1:57 AM   Specimen: Nasopharyngeal Swab; Nasopharyngeal(NP) swabs in vial transport medium  Result Value Ref Range Status  SARS Coronavirus 2 by RT PCR NEGATIVE NEGATIVE Final    Comment: (NOTE) SARS-CoV-2 target nucleic acids are NOT DETECTED.  The SARS-CoV-2 RNA is generally detectable in upper respiratory specimens during the acute phase of infection. The lowest concentration of SARS-CoV-2 viral copies this assay can detect is 138 copies/mL. A negative result does not preclude SARS-Cov-2 infection and should not be used as the sole basis for treatment or other patient management decisions. A negative result may occur with  improper specimen collection/handling, submission of specimen other than nasopharyngeal swab, presence of viral mutation(s) within the areas targeted by this assay, and inadequate number of viral copies(<138 copies/mL). A negative result must be combined with clinical observations, patient history, and epidemiological information. The expected result is Negative.  Fact Sheet for Patients:  EntrepreneurPulse.com.au  Fact Sheet for Healthcare Providers:   IncredibleEmployment.be  This test is no t yet approved or cleared by the [place] FDA and  has been authorized for detection and/or diagnosis of SARS-CoV-2 by FDA under an Emergency Use Authorization (EUA). This EUA will remain  in effect (meaning this test can be used) for the duration of the COVID-19 declaration under Section 564(b)(1) of the Act, 21 U.S.C.section 360bbb-3(b)(1), unless the authorization is terminated  or revoked sooner.       Influenza A by PCR NEGATIVE NEGATIVE Final   Influenza B by PCR NEGATIVE NEGATIVE Final    Comment: (NOTE) The Xpert Xpress SARS-CoV-2/FLU/RSV plus assay is intended as an aid in the diagnosis of influenza from Nasopharyngeal swab specimens and should not be used as a sole basis for treatment. Nasal washings and aspirates are unacceptable for Xpert Xpress SARS-CoV-2/FLU/RSV testing.  Fact Sheet for Patients: EntrepreneurPulse.com.au  Fact Sheet for Healthcare Providers: IncredibleEmployment.be  This test is not yet approved or cleared by the [place] FDA and has been authorized for detection and/or diagnosis of SARS-CoV-2 by FDA under an Emergency Use Authorization (EUA). This EUA will remain in effect (meaning this test can be used) for the duration of the COVID-19 declaration under Section 564(b)(1) of the Act, 21 U.S.C. section 360bbb-3(b)(1), unless the authorization is terminated or revoked.  Performed at [facility], [address], [place], [place] [address]   Urine Culture     Status: None (Preliminary result)   Collection Time: 12/09/21  3:48 AM   Specimen: Urine, Random  Result Value Ref Range Status   Specimen Description   Final    URINE, RANDOM Performed at [facility], [address] [place], [place] [address]    Special Requests   Final    NONE Performed at [facility], [address], [place], [place] [address]     Culture   Final    CULTURE REINCUBATED FOR BETTER GROWTH Performed at [facility], [place] [address], [place], [place] [address]    Report Status PENDING  Incomplete    Labs: CBC: Recent Labs  Lab 12/09/21 0126 12/10/21 0601  WBC 10.4 7.4  NEUTROABS 8.7*  --   HGB 13.6 12.7*  HCT 41.0 38.2*  MCV 97.6 94.8  PLT 281 342   Basic Metabolic Panel: Recent Labs  Lab 12/09/21 0126 12/10/21 0601  NA 137 135  K 4.5 3.3*  CL 103 104  CO2 27 25  GLUCOSE 131* 83  BUN 18 19  CREATININE 1.39* 1.62*  CALCIUM 9.3 8.7*  MG  --  2.0  PHOS  --  2.8   Liver Function Tests: Recent Labs  Lab 12/09/21 0126 12/10/21 0601  AST 17 14*  ALT 10 8  ALKPHOS 78 63  BILITOT 0.6 0.6  PROT 8.4* 6.8  ALBUMIN 4.2 3.3*   CBG: No results for input(s): GLUCAP in the last 168 hours.  Discharge time spent: greater than 30 minutes.  Signed: Murray Hodgkins, MD [facility] 12/10/2021

## 2021-12-10 NOTE — Hospital Course (Addendum)
61 year old man presented with acute confusion and emesis superimposed on dementia.  Sister had noted he had been confused since the doctor's office visit earlier in the day.  Admitted for acute metabolic encephalopathy, further history revealed increased urinary frequency and testing suggested UTI.  Treated for symptomatic UTI with clinical resolution of encephalopathy.  Back to baseline today and stable for discharge home.

## 2021-12-10 NOTE — Assessment & Plan Note (Addendum)
--   Clinically resolved.  Increased urinary frequency recently thought to be BPH.  Complete 3-day course of antibiotic.

## 2021-12-10 NOTE — Evaluation (Signed)
Clinical/Bedside Swallow Evaluation Patient Details  Name: THOR NANNINI MRN: [id number] Date of Birth: 11-Feb-1961  Today's Date: 12/10/2021 Time: SLP Start Time (ACUTE ONLY): 22 SLP Stop Time (ACUTE ONLY): 1145 SLP Time Calculation (min) (ACUTE ONLY): 40 min  Past Medical History:  Past Medical History:  Diagnosis Date   Dementia ([place])    Diabetes mellitus without complication ([place])    Hypertension    Past Surgical History: History reviewed. No pertinent surgical history. HPI:  Pt is a 61 year old man presented with acute confusion and emesis superimposed on dementia.  Sister had noted he had been confused since the doctor's office visit earlier in the day.  Admitted for acute metabolic encephalopathy, further history revealed increased urinary frequency and testing suggested UTI.  Treated for symptomatic UTI with clinical resolution of encephalopathy.  Back to baseline today per Wife and stable for discharge home per MD.  CXR: No evidence of acute cardiopulmonary disease.    Assessment / Plan / Recommendation  Clinical Impression  Pt appears to present w/ adequate oropharyngeal phase swallow w/ No overt oropharyngeal phase dysphagia noted, No neuromuscular deficits noted in light of declined Cognitive status; Baseline dx of Dementia per chart. ANY Cognitive decline can impact overall awareness/timing of swallow and safety during po tasks which increases risk for aspiration, choking as well as overall desire for oral intake. Wife stated pt does not eat "as much sometimes" at home. Pt's risk for aspiration is reduced when following general aspiration precautions and given support during meals as needed for any follow through w/ tasks; also offering a soft/moist foods diet -- easy to eat to aid overall intake/nutrition.   Pt consumed po trials of thin liquids via straw holding his own cup w/ No overt, clinical s/s of aspiration during po trials. Pt consumed food consistencies at breakfast  meal w/ no overt coughing, decline in vocal quality, or change in respiratory presentation during/post trials per Wife and NSG. Oral phase appeared [facility] w/ timely bolus management and control of bolus propulsion for A-P transfer for swallowing. Oral clearing achieved appropriately/timely. OM Exam appeared [facility] w/ no unilateral weakness noted. Speech Clear. Pt fed self w/ setup support. He tended to rest in bed w/ eyes closed.   Recommend a Regular consistency diet w/ well-Cut meats, moistened foods; Thin liquids. Recommend general aspiration precautions, Pills WHOLE in Puree if needed for safer, easier swallowing. Education given on Pills in Puree; food consistencies and easy to eat options of foods(casseroles, crock pot meals) and to flavor foods to taste to entice desire for intake; general aspiration precautions. Recommend f/u w/ Dietician for support/nutritional ideas post D/C. NSG to reconsult if any new needs arise. NSG agreed.  SLP Visit Diagnosis: Dysphagia, unspecified (R13.10) (suspect impact from baseline Cognitive decline, Dementia)    Aspiration Risk   (reduced following general precautions)    Diet Recommendation   Regular consistency diet w/ well-Cut meats, moistened foods; Thin liquids. Recommend general aspiration precautions, reduce distractions during meals. Support at meals w/ setup.  Medication Administration: Whole meds with puree (as needed for ease of swallow)    Other  Recommendations Recommended Consults:  (Dietician f/u for support and ideas) Oral Care Recommendations: Oral care BID;Oral care before and after PO;Staff/trained caregiver to provide oral care Other Recommendations:  (n/a)    Recommendations for follow up therapy are one component of a multi-disciplinary discharge planning process, led by the attending physician.  Recommendations may be updated based on patient status, additional functional criteria and insurance  authorization.  Follow up Recommendations No SLP  follow up      Assistance Recommended at Discharge Intermittent Supervision/Assistance  Functional Status Assessment Patient has not had a recent decline in their functional status  Frequency and Duration  (n/a)   (n/a)       Prognosis Prognosis for Safe Diet Advancement: Fair (-Good) Barriers to Reach Goals: Cognitive deficits;Time post onset;Severity of deficits;Behavior      Swallow Study   General Date of Onset: 12/09/21 HPI: Pt is a 61 year old man presented with acute confusion and emesis superimposed on dementia.  Sister had noted he had been confused since the doctor's office visit earlier in the day.  Admitted for acute metabolic encephalopathy, further history revealed increased urinary frequency and testing suggested UTI.  Treated for symptomatic UTI with clinical resolution of encephalopathy.  Back to baseline today per Wife and stable for discharge home per MD.  CXR: No evidence of acute cardiopulmonary disease. Type of Study: Bedside Swallow Evaluation Previous Swallow Assessment: none Diet Prior to this Study: Dysphagia 3 (soft);Thin liquids (per MD) Temperature Spikes Noted: No (wbc 7.4) Respiratory Status: Room air History of Recent Intubation: No Behavior/Cognition: Alert;Cooperative;Pleasant mood;Distractible;Requires cueing (eyes closed a lot) Oral Cavity Assessment: Within Functional Limits Oral Care Completed by SLP: Recent completion by staff Oral Cavity - Dentition: Adequate natural dentition Vision: Functional for self-feeding Self-Feeding Abilities: Able to feed self;Needs set up;Needs assist Patient Positioning: Upright in bed Baseline Vocal Quality: Low vocal intensity (adequate)    Oral/Motor/Sensory Function Overall Oral Motor/Sensory Function: Within functional limits (no unilateral weakness)   Ice Chips Ice chips: Not tested   Thin Liquid Thin Liquid: Within functional limits Presentation: Self Fed;Straw (~5-6 ozs total)    Nectar Thick Nectar  Thick Liquid: Not tested   Honey Thick Honey Thick Liquid: Not tested   Puree Puree: Not tested   Solid     Solid: Within functional limits (no deficits at breakfast meal per Wife, NSG)         Orinda Kenner, [place], [place]; [place] [phone] (ascom) Naziya Hegwood 12/10/2021,1:00 PM

## 2021-12-14 LAB — CULTURE, BLOOD (ROUTINE X 2)
Culture: NO GROWTH
Culture: NO GROWTH
Special Requests: ADEQUATE

## 2021-12-22 ENCOUNTER — Emergency Department: Payer: 59

## 2021-12-22 ENCOUNTER — Inpatient Hospital Stay
Admission: EM | Admit: 2021-12-22 | Discharge: 2022-01-17 | DRG: 674 | Disposition: A | Discharge status: Home Health | Payer: 59 | Attending: Internal Medicine | Admitting: Internal Medicine

## 2021-12-22 ENCOUNTER — Other Ambulatory Visit: Payer: Self-pay

## 2021-12-22 DIAGNOSIS — F0393 Unspecified dementia, unspecified severity, with mood disturbance: Secondary | ICD-10-CM | POA: Diagnosis present

## 2021-12-22 DIAGNOSIS — E860 Dehydration: Secondary | ICD-10-CM | POA: Diagnosis present

## 2021-12-22 DIAGNOSIS — Z992 Dependence on renal dialysis: Secondary | ICD-10-CM

## 2021-12-22 DIAGNOSIS — E871 Hypo-osmolality and hyponatremia: Secondary | ICD-10-CM | POA: Diagnosis not present

## 2021-12-22 DIAGNOSIS — R112 Nausea with vomiting, unspecified: Secondary | ICD-10-CM | POA: Diagnosis not present

## 2021-12-22 DIAGNOSIS — N1831 Chronic kidney disease, stage 3a: Secondary | ICD-10-CM

## 2021-12-22 DIAGNOSIS — R34 Anuria and oliguria: Secondary | ICD-10-CM | POA: Diagnosis not present

## 2021-12-22 DIAGNOSIS — K5900 Constipation, unspecified: Secondary | ICD-10-CM | POA: Diagnosis not present

## 2021-12-22 DIAGNOSIS — N186 End stage renal disease: Secondary | ICD-10-CM | POA: Diagnosis present

## 2021-12-22 DIAGNOSIS — I10 Essential (primary) hypertension: Secondary | ICD-10-CM | POA: Diagnosis present

## 2021-12-22 DIAGNOSIS — Z79899 Other long term (current) drug therapy: Secondary | ICD-10-CM

## 2021-12-22 DIAGNOSIS — F0390 Unspecified dementia without behavioral disturbance: Secondary | ICD-10-CM | POA: Diagnosis present

## 2021-12-22 DIAGNOSIS — F03918 Unspecified dementia, unspecified severity, with other behavioral disturbance: Secondary | ICD-10-CM | POA: Diagnosis present

## 2021-12-22 DIAGNOSIS — I132 Hypertensive heart and chronic kidney disease with heart failure and with stage 5 chronic kidney disease, or end stage renal disease: Secondary | ICD-10-CM | POA: Diagnosis present

## 2021-12-22 DIAGNOSIS — K269 Duodenal ulcer, unspecified as acute or chronic, without hemorrhage or perforation: Secondary | ICD-10-CM | POA: Diagnosis present

## 2021-12-22 DIAGNOSIS — K2970 Gastritis, unspecified, without bleeding: Secondary | ICD-10-CM | POA: Diagnosis present

## 2021-12-22 DIAGNOSIS — K30 Functional dyspepsia: Secondary | ICD-10-CM | POA: Diagnosis present

## 2021-12-22 DIAGNOSIS — Z8673 Personal history of transient ischemic attack (TIA), and cerebral infarction without residual deficits: Secondary | ICD-10-CM

## 2021-12-22 DIAGNOSIS — Z7982 Long term (current) use of aspirin: Secondary | ICD-10-CM

## 2021-12-22 DIAGNOSIS — Z66 Do not resuscitate: Secondary | ICD-10-CM | POA: Diagnosis not present

## 2021-12-22 DIAGNOSIS — I5022 Chronic systolic (congestive) heart failure: Secondary | ICD-10-CM | POA: Diagnosis present

## 2021-12-22 DIAGNOSIS — F17210 Nicotine dependence, cigarettes, uncomplicated: Secondary | ICD-10-CM | POA: Diagnosis present

## 2021-12-22 DIAGNOSIS — D72829 Elevated white blood cell count, unspecified: Secondary | ICD-10-CM

## 2021-12-22 DIAGNOSIS — Z880 Allergy status to penicillin: Secondary | ICD-10-CM

## 2021-12-22 DIAGNOSIS — I5042 Chronic combined systolic (congestive) and diastolic (congestive) heart failure: Secondary | ICD-10-CM | POA: Diagnosis present

## 2021-12-22 DIAGNOSIS — Z20822 Contact with and (suspected) exposure to covid-19: Secondary | ICD-10-CM | POA: Diagnosis present

## 2021-12-22 DIAGNOSIS — K449 Diaphragmatic hernia without obstruction or gangrene: Secondary | ICD-10-CM | POA: Diagnosis present

## 2021-12-22 DIAGNOSIS — R1110 Vomiting, unspecified: Secondary | ICD-10-CM | POA: Diagnosis present

## 2021-12-22 DIAGNOSIS — K2100 Gastro-esophageal reflux disease with esophagitis, without bleeding: Secondary | ICD-10-CM | POA: Diagnosis present

## 2021-12-22 DIAGNOSIS — E1122 Type 2 diabetes mellitus with diabetic chronic kidney disease: Secondary | ICD-10-CM | POA: Diagnosis present

## 2021-12-22 DIAGNOSIS — D631 Anemia in chronic kidney disease: Secondary | ICD-10-CM | POA: Diagnosis present

## 2021-12-22 DIAGNOSIS — K2980 Duodenitis without bleeding: Secondary | ICD-10-CM | POA: Diagnosis present

## 2021-12-22 DIAGNOSIS — N179 Acute kidney failure, unspecified: Principal | ICD-10-CM

## 2021-12-22 DIAGNOSIS — E876 Hypokalemia: Secondary | ICD-10-CM | POA: Diagnosis not present

## 2021-12-22 DIAGNOSIS — F321 Major depressive disorder, single episode, moderate: Secondary | ICD-10-CM

## 2021-12-22 DIAGNOSIS — T39395A Adverse effect of other nonsteroidal anti-inflammatory drugs [NSAID], initial encounter: Secondary | ICD-10-CM | POA: Diagnosis present

## 2021-12-22 LAB — LIPASE, BLOOD: Lipase: 33 U/L (ref 11–51)

## 2021-12-22 LAB — COMPREHENSIVE METABOLIC PANEL
ALT: 14 U/L (ref 0–44)
AST: 18 U/L (ref 15–41)
Albumin: 3.7 g/dL (ref 3.5–5.0)
Alkaline Phosphatase: 67 U/L (ref 38–126)
Anion gap: 8 (ref 5–15)
BUN: 33 mg/dL — ABNORMAL HIGH (ref 6–20)
CO2: 26 mmol/L (ref 22–32)
Calcium: 9.2 mg/dL (ref 8.9–10.3)
Chloride: 105 mmol/L (ref 98–111)
Creatinine, Ser: 3.57 mg/dL — ABNORMAL HIGH (ref 0.61–1.24)
GFR, Estimated: 19 mL/min — ABNORMAL LOW (ref >60)
Glucose, Bld: 176 mg/dL — ABNORMAL HIGH (ref 70–99)
Potassium: 3.8 mmol/L (ref 3.5–5.1)
Sodium: 139 mmol/L (ref 135–145)
Total Bilirubin: 0.5 mg/dL (ref 0.3–1.2)
Total Protein: 7.2 g/dL (ref 6.5–8.1)

## 2021-12-22 LAB — CBC
HCT: 39.3 % (ref 39.0–52.0)
Hemoglobin: 13 g/dL (ref 13.0–17.0)
MCH: 31.9 pg (ref 26.0–34.0)
MCHC: 33.1 g/dL (ref 30.0–36.0)
MCV: 96.6 fL (ref 80.0–100.0)
Platelets: 255 10*3/uL (ref 150–400)
RBC: 4.07 MIL/uL — ABNORMAL LOW (ref 4.22–5.81)
RDW: 13.1 % (ref 11.5–15.5)
WBC: 17.9 10*3/uL — ABNORMAL HIGH (ref 4.0–10.5)
nRBC: 0 % (ref 0.0–0.2)

## 2021-12-22 MED ORDER — ONDANSETRON HCL 4 MG/2ML IJ SOLN
4.0000 mg | Freq: Once | INTRAMUSCULAR | Status: AC
  Administered 2021-12-22: 4 mg via INTRAVENOUS
  Filled 2021-12-22: qty 2

## 2021-12-22 MED ORDER — SODIUM CHLORIDE 0.9 % IV SOLN: INTRAVENOUS | Status: DC

## 2021-12-22 MED ORDER — SODIUM CHLORIDE 0.9 % IV BOLUS (SEPSIS)
1000.0000 mL | Freq: Once | INTRAVENOUS | Status: AC
  Administered 2021-12-22: 1000 mL via INTRAVENOUS

## 2021-12-22 NOTE — ED Notes (Signed)
Heme occult Negative

## 2021-12-22 NOTE — ED Provider Notes (Signed)
[facility] Provider Note    Event Date/Time   First MD Initiated Contact with Patient 12/22/21 2255     (approximate)   History   Emesis   HPI  Randall [name] is a 61 y.o. male with history of dementia, hypertension, diabetes, CKD, CHF who presents to the emergency department with his sister for concerns for nausea, vomiting today and weakness.  She states 3 days ago he was started on Namenda and Flomax and she is concerned that this could be causing his symptoms.  No known fevers, diarrhea.  He denies any chest pain or abdominal pain but history limited due to his dementia.   History provided by sister and patient.    Past Medical History:  Diagnosis Date   Dementia ([place])    Diabetes mellitus without complication ([place])    Hypertension     History reviewed. No pertinent surgical history.  MEDICATIONS:  Prior to Admission medications   Medication Sig Start Date End Date Taking? Authorizing Provider  amLODipine (NORVASC) 10 MG tablet Take 10 mg by mouth daily. 08/19/21   [provider]  aspirin 81 MG EC tablet Take 81 mg by mouth daily. 09/04/21   [provider]  carvedilol (COREG) 25 MG tablet Take 25 mg by mouth 2 (two) times daily. 09/04/21 03/03/22  [provider]  cefUROXime (CEFTIN) 500 MG tablet Take 1 tablet (500 mg total) by mouth 2 (two) times daily with a meal. Start 2/5 AM 12/10/21   Samuella Cota, MD  memantine [facility]) 5 MG tablet Take as directed by your doctor. 12/10/21   Samuella Cota, MD  rosuvastatin (CRESTOR) 40 MG tablet Take 40 mg by mouth daily. 09/04/21   [provider]  sacubitril-valsartan (ENTRESTO) 97-103 MG Take 1 tablet by mouth 2 (two) times daily. 10/03/21 10/03/22  [provider]  sertraline (ZOLOFT) 100 MG tablet Take 100 mg by mouth daily. 08/19/21   [provider]  tamsulosin (FLOMAX) 0.4 MG CAPS capsule Take 0.4 mg by mouth daily. 12/08/21 12/08/22   [provider]    Physical Exam   Triage Vital Signs: ED Triage Vitals  Enc Vitals Group     BP 12/22/21 1801 120/72     Pulse Rate 12/22/21 1801 67     Resp 12/22/21 1801 18     Temp 12/22/21 1801 98.7 F (37.1 C)     Temp Source 12/22/21 1801 Oral     SpO2 12/22/21 1801 97 %     Weight 12/22/21 1802 169 lb (76.7 kg)     Height 12/22/21 1802 5\' 9"  (1.753 m)     Head Circumference --      Peak Flow --      Pain Score 12/22/21 1802 0     Pain Loc --      Pain Edu? --      Excl. in [place]? --     Most recent vital signs: Vitals:   12/22/21 2330 12/23/21 0030  BP: (!) 149/66 (!) 153/76  Pulse: 70 76  Resp: 18 18  Temp:    SpO2: 98% 98%    CONSTITUTIONAL: Alert and oriented to person.  Pleasantly demented.  Elderly. HEAD: Normocephalic, atraumatic EYES: Conjunctivae clear, pupils appear equal, sclera nonicteric ENT: normal nose; moist mucous membranes NECK: Supple, normal ROM CARD: RRR; S1 and S2 appreciated; no murmurs, no clicks, no rubs, no gallops RESP: Normal chest excursion without splinting or tachypnea; breath sounds clear and equal  bilaterally; no wheezes, no rhonchi, no rales, no hypoxia or respiratory distress, speaking full sentences ABD/GI: Normal bowel sounds; non-distended; soft, non-tender, no rebound, no guarding, no peritoneal signs BACK: The back appears normal EXT: Normal ROM in all joints; no deformity noted, no edema; no cyanosis SKIN: Normal color for age and race; warm; no rash on exposed skin NEURO: Moves all extremities equally, normal speech PSYCH: The patient's mood and manner are appropriate.   ED Results / Procedures / Treatments   LABS: (all labs ordered are listed, but only abnormal results are displayed) Labs Reviewed  COMPREHENSIVE METABOLIC PANEL - Abnormal; Notable for the following components:      Result Value   Glucose, Bld 176 (*)    BUN 33 (*)    Creatinine, Ser 3.57 (*)    GFR, Estimated 19 (*)    All other  components within normal limits  CBC - Abnormal; Notable for the following components:   WBC 17.9 (*)    RBC 4.07 (*)    All other components within normal limits  URINALYSIS, ROUTINE W REFLEX MICROSCOPIC - Abnormal; Notable for the following components:   Color, Urine YELLOW (*)    APPearance HAZY (*)    Protein, ur 30 (*)    Bacteria, UA RARE (*)    All other components within normal limits  RESP PANEL BY RT-PCR (FLU A&B, COVID) ARPGX2  LIPASE, BLOOD     EKG:   RADIOLOGY: My personal review and interpretation of imaging: CT abdomen pelvis shows no acute abnormality.  I have personally reviewed all radiology reports.   CT ABDOMEN PELVIS WO CONTRAST  Result Date: 12/22/2021 CLINICAL DATA:  Abdominal pain, acute, nonlocalized EXAM: CT ABDOMEN AND PELVIS WITHOUT CONTRAST TECHNIQUE: Multidetector CT imaging of the abdomen and pelvis was performed following the standard protocol without IV contrast. RADIATION DOSE REDUCTION: This exam was performed according to the departmental dose-optimization program which includes automated exposure control, adjustment of the mA and/or kV according to patient size and/or use of iterative reconstruction technique. COMPARISON:  None. FINDINGS: Lower chest: No acute abnormality.  Possible tiny hiatal hernia. Hepatobiliary: No focal liver abnormality. No gallstones, gallbladder wall thickening, or pericholecystic fluid. No biliary dilatation. Pancreas: No focal lesion. Normal pancreatic contour. No surrounding inflammatory changes. No main pancreatic ductal dilatation. Spleen: Normal in size without focal abnormality. Adrenals/Urinary Tract: No adrenal nodule bilaterally. No nephrolithiasis and no hydronephrosis. No definite contour-deforming renal mass. No ureterolithiasis or hydroureter. The urinary bladder is unremarkable. Stomach/Bowel: Stomach is within normal limits. No evidence of bowel wall thickening or dilatation. Stool throughout the colon. The  appendix is not definitely identified with no inflammatory changes in the right lower quadrant to suggest acute appendicitis. Vascular/Lymphatic: No abdominal aorta or iliac aneurysm. At least moderate atherosclerotic plaque of the aorta and its branches. No abdominal, pelvic, or inguinal lymphadenopathy. Reproductive: Prostate is unremarkable. Other: No intraperitoneal free fluid. No intraperitoneal free gas. No organized fluid collection. Musculoskeletal: No abdominal wall hernia or abnormality. No suspicious lytic or blastic osseous lesions. No acute displaced fracture. Multilevel degenerative changes of the spine. IMPRESSION: 1. Possible tiny hiatal hernia. 2. Constipation. 3. Otherwise no acute intra-abdominal or intrapelvic abnormality. 4.  Aortic Atherosclerosis (ICD10-I70.0). Electronically Signed   By: Iven Finn M.D.   On: 12/22/2021 23:56     PROCEDURES:  Critical Care performed: No   CRITICAL CARE Performed by: Cyril Mourning Kynli Chou   Total critical care time: 0 minutes  Critical care time was exclusive of separately billable  procedures and treating other patients.  Critical care was necessary to treat or prevent imminent or life-threatening deterioration.  Critical care was time spent personally by me on the following activities: development of treatment plan with patient and/or surrogate as well as nursing, discussions with consultants, evaluation of patient's response to treatment, examination of patient, obtaining history from patient or surrogate, ordering and performing treatments and interventions, ordering and review of laboratory studies, ordering and review of radiographic studies, pulse oximetry and re-evaluation of patient's condition.   Marland Kitchen1-3 Lead EKG Interpretation Performed by: Kyandra Mcclaine, Delice Bison, DO Authorized by: Cylan Borum, Delice Bison, DO     Interpretation: normal     ECG rate:  75   ECG rate assessment: normal     Rhythm: sinus rhythm     Ectopy: none     Conduction:  normal      IMPRESSION / MDM / ASSESSMENT AND PLAN / ED COURSE  I reviewed the triage vital signs and the nursing notes.    Patient here with nausea and vomiting, weakness.  The patient is on the cardiac monitor to evaluate for evidence of arrhythmia and/or significant heart rate changes.   DIFFERENTIAL DIAGNOSIS (includes but not limited to):   Viral gastroenteritis, dehydration, UTI, colitis, diverticulitis, appendicitis, cholecystitis, pancreatitis, biliary colic, less likely ACS   PLAN: We will obtain CBC, CMP, lipase, urine, CT of abdomen pelvis.  Will give IV fluids, Zofran for symptomatic relief.   MEDICATIONS GIVEN IN ED: Medications  0.9 %  sodium chloride infusion ( Intravenous New Bag/Given 12/22/21 2310)  rosuvastatin (CRESTOR) tablet 40 mg (has no administration in time range)  carvedilol (COREG) tablet 25 mg (has no administration in time range)  amLODipine (NORVASC) tablet 10 mg (has no administration in time range)  tamsulosin (FLOMAX) capsule 0.4 mg (has no administration in time range)  enoxaparin (LOVENOX) injection 40 mg (has no administration in time range)  acetaminophen (TYLENOL) tablet 650 mg (has no administration in time range)    Or  acetaminophen (TYLENOL) suppository 650 mg (has no administration in time range)  ondansetron (ZOFRAN) tablet 4 mg (has no administration in time range)    Or  ondansetron (ZOFRAN) injection 4 mg (has no administration in time range)  pantoprazole (PROTONIX) injection 40 mg (has no administration in time range)  sodium chloride 0.9 % bolus 1,000 mL (0 mLs Intravenous Stopped 12/22/21 2341)  ondansetron (ZOFRAN) injection 4 mg (4 mg Intravenous Given 12/22/21 2310)     ED COURSE: Patient's labs show leukocytosis of 17,000.  He has a new AKI with creatinine of 3.5.  Normal electrolytes.  LFTs and lipase is normal.  I have reviewed the CT of his abdomen and pelvis as well as the report from radiology and there is no acute  pathology present.  He has some constipation but no appendicitis, colitis, diverticulitis.  His abdominal exam today is benign.  Discussed with sister that I recommend admission for hydration.  His urine does not appear infected today.  His COVID and flu is negative.   CONSULTS:  Consulted and discussed patient's case with hospitalist, Dr. Damita Dunnings.  I have recommended admission and consulting physician agrees and will place admission orders.  Patient (and family if present) agree with this plan.   I reviewed all nursing notes, vitals, pertinent previous records.  All labs, EKGs, imaging ordered have been independently reviewed and interpreted by myself.    OUTSIDE RECORDS REVIEWED: Reviewed patient's recent admission 12/09/2021 to 12/10/2021 for altered mental status secondary  to urinary tract infection.         FINAL CLINICAL IMPRESSION(S) / ED DIAGNOSES   Final diagnoses:  AKI (acute kidney injury) ([place])  Vomiting in adult     Rx / DC Orders   ED Discharge Orders     None        Note:  This document was prepared using Dragon voice recognition software and may include unintentional dictation errors.   Evora Schechter, Delice Bison, DO 12/23/21 0206

## 2021-12-22 NOTE — ED Notes (Signed)
Pt to CT

## 2021-12-22 NOTE — ED Triage Notes (Signed)
Pt here with family member who states pt was started on a new medication memantine and he has been throwing up since- pt has been weaker than normal, having to use a walker when he doesn't normally use one

## 2021-12-23 ENCOUNTER — Encounter: Payer: Self-pay | Admitting: Internal Medicine

## 2021-12-23 DIAGNOSIS — R1110 Vomiting, unspecified: Secondary | ICD-10-CM | POA: Diagnosis not present

## 2021-12-23 DIAGNOSIS — F321 Major depressive disorder, single episode, moderate: Secondary | ICD-10-CM | POA: Diagnosis not present

## 2021-12-23 DIAGNOSIS — N179 Acute kidney failure, unspecified: Secondary | ICD-10-CM

## 2021-12-23 DIAGNOSIS — N1831 Chronic kidney disease, stage 3a: Secondary | ICD-10-CM

## 2021-12-23 DIAGNOSIS — D72829 Elevated white blood cell count, unspecified: Secondary | ICD-10-CM

## 2021-12-23 LAB — URINALYSIS, ROUTINE W REFLEX MICROSCOPIC
Bilirubin Urine: NEGATIVE
Glucose, UA: NEGATIVE mg/dL
Hgb urine dipstick: NEGATIVE
Ketones, ur: NEGATIVE mg/dL
Leukocytes,Ua: NEGATIVE
Nitrite: NEGATIVE
Protein, ur: 30 mg/dL — AB
Specific Gravity, Urine: 1.01 (ref 1.005–1.030)
pH: 5 (ref 5.0–8.0)

## 2021-12-23 LAB — CBC
HCT: 34.5 % — ABNORMAL LOW (ref 39.0–52.0)
Hemoglobin: 11.5 g/dL — ABNORMAL LOW (ref 13.0–17.0)
MCH: 31.9 pg (ref 26.0–34.0)
MCHC: 33.3 g/dL (ref 30.0–36.0)
MCV: 95.8 fL (ref 80.0–100.0)
Platelets: 201 10*3/uL (ref 150–400)
RBC: 3.6 MIL/uL — ABNORMAL LOW (ref 4.22–5.81)
RDW: 13.1 % (ref 11.5–15.5)
WBC: 13.5 10*3/uL — ABNORMAL HIGH (ref 4.0–10.5)
nRBC: 0 % (ref 0.0–0.2)

## 2021-12-23 LAB — COMPREHENSIVE METABOLIC PANEL
ALT: 12 U/L (ref 0–44)
AST: 16 U/L (ref 15–41)
Albumin: 2.9 g/dL — ABNORMAL LOW (ref 3.5–5.0)
Alkaline Phosphatase: 58 U/L (ref 38–126)
Anion gap: 7 (ref 5–15)
BUN: 41 mg/dL — ABNORMAL HIGH (ref 6–20)
CO2: 22 mmol/L (ref 22–32)
Calcium: 8.3 mg/dL — ABNORMAL LOW (ref 8.9–10.3)
Chloride: 109 mmol/L (ref 98–111)
Creatinine, Ser: 4.58 mg/dL — ABNORMAL HIGH (ref 0.61–1.24)
GFR, Estimated: 14 mL/min — ABNORMAL LOW (ref >60)
Glucose, Bld: 108 mg/dL — ABNORMAL HIGH (ref 70–99)
Potassium: 3.6 mmol/L (ref 3.5–5.1)
Sodium: 138 mmol/L (ref 135–145)
Total Bilirubin: 0.5 mg/dL (ref 0.3–1.2)
Total Protein: 5.8 g/dL — ABNORMAL LOW (ref 6.5–8.1)

## 2021-12-23 LAB — BRAIN NATRIURETIC PEPTIDE: B Natriuretic Peptide: 206.5 pg/mL — ABNORMAL HIGH (ref 0.0–100.0)

## 2021-12-23 LAB — RESP PANEL BY RT-PCR (FLU A&B, COVID) ARPGX2
Influenza A by PCR: NEGATIVE
Influenza B by PCR: NEGATIVE
SARS Coronavirus 2 by RT PCR: NEGATIVE

## 2021-12-23 LAB — LACTIC ACID, PLASMA
Lactic Acid, Venous: 0.7 mmol/L (ref 0.5–1.9)
Lactic Acid, Venous: 0.9 mmol/L (ref 0.5–1.9)

## 2021-12-23 MED ORDER — ONDANSETRON HCL 4 MG/2ML IJ SOLN
4.0000 mg | Freq: Four times a day (QID) | INTRAMUSCULAR | Status: DC | PRN
  Administered 2021-12-29 – 2022-01-01 (×2): 4 mg via INTRAVENOUS
  Filled 2021-12-23: qty 2

## 2021-12-23 MED ORDER — ACETAMINOPHEN 325 MG PO TABS
650.0000 mg | ORAL_TABLET | Freq: Four times a day (QID) | ORAL | Status: DC | PRN
  Administered 2021-12-23 – 2021-12-30 (×4): 650 mg via ORAL
  Filled 2021-12-23 (×3): qty 2

## 2021-12-23 MED ORDER — SORBITOL 70 % SOLN
960.0000 mL | TOPICAL_OIL | Freq: Every day | ORAL | Status: DC | PRN
  Filled 2021-12-23: qty 473

## 2021-12-23 MED ORDER — PANTOPRAZOLE SODIUM 40 MG IV SOLR
40.0000 mg | INTRAVENOUS | Status: DC
  Administered 2021-12-23 – 2021-12-24 (×2): 40 mg via INTRAVENOUS
  Filled 2021-12-23 (×2): qty 10

## 2021-12-23 MED ORDER — SERTRALINE HCL 50 MG PO TABS
100.0000 mg | ORAL_TABLET | Freq: Every day | ORAL | Status: DC
  Administered 2021-12-23 – 2022-01-17 (×23): 100 mg via ORAL
  Filled 2021-12-23 (×23): qty 2

## 2021-12-23 MED ORDER — ENOXAPARIN SODIUM 30 MG/0.3ML IJ SOSY
30.0000 mg | PREFILLED_SYRINGE | INTRAMUSCULAR | Status: DC
  Administered 2021-12-23 – 2021-12-25 (×3): 30 mg via SUBCUTANEOUS
  Filled 2021-12-23 (×3): qty 0.3

## 2021-12-23 MED ORDER — AMLODIPINE BESYLATE 10 MG PO TABS
10.0000 mg | ORAL_TABLET | Freq: Every day | ORAL | Status: DC
  Administered 2021-12-24 – 2022-01-10 (×15): 10 mg via ORAL
  Filled 2021-12-23 (×9): qty 1
  Filled 2021-12-23: qty 2
  Filled 2021-12-23 (×7): qty 1

## 2021-12-23 MED ORDER — TAMSULOSIN HCL 0.4 MG PO CAPS
0.4000 mg | ORAL_CAPSULE | Freq: Every day | ORAL | Status: DC
  Administered 2021-12-24 – 2022-01-17 (×22): 0.4 mg via ORAL
  Filled 2021-12-23 (×23): qty 1

## 2021-12-23 MED ORDER — ROSUVASTATIN CALCIUM 10 MG PO TABS
40.0000 mg | ORAL_TABLET | Freq: Every day | ORAL | Status: DC
  Administered 2021-12-24 – 2021-12-25 (×2): 40 mg via ORAL
  Filled 2021-12-23: qty 4
  Filled 2021-12-23: qty 2
  Filled 2021-12-23: qty 4

## 2021-12-23 MED ORDER — ACETAMINOPHEN 650 MG RE SUPP: 650.0000 mg | Freq: Four times a day (QID) | RECTAL | Status: DC | PRN

## 2021-12-23 MED ORDER — ARIPIPRAZOLE 2 MG PO TABS
1.0000 mg | ORAL_TABLET | Freq: Every day | ORAL | Status: DC
  Administered 2021-12-23 – 2022-01-17 (×23): 1 mg via ORAL
  Filled 2021-12-23 (×26): qty 1

## 2021-12-23 MED ORDER — ONDANSETRON HCL 4 MG PO TABS: 4.0000 mg | ORAL_TABLET | Freq: Four times a day (QID) | ORAL | Status: DC | PRN

## 2021-12-23 MED ORDER — CARVEDILOL 25 MG PO TABS
25.0000 mg | ORAL_TABLET | Freq: Two times a day (BID) | ORAL | Status: DC
  Administered 2021-12-23 – 2022-01-12 (×32): 25 mg via ORAL
  Filled 2021-12-23 (×35): qty 1

## 2021-12-23 NOTE — ED Notes (Addendum)
Pt transitioned to hospital bed to promote comfort.

## 2021-12-23 NOTE — Assessment & Plan Note (Addendum)
Recurrent episodes with second hospitalization in 2 weeks for the same No definite etiology identified. GI was consulted and patient underwent EGD on 12/26/2020 which shows esophagitis, some esophageal dysmotility, gastritis and few nonbleeding erosions in duodenum, biopsies were taken. -Continue with Protonix -Nutritionist evaluation

## 2021-12-23 NOTE — Evaluation (Signed)
Physical Therapy Evaluation Patient Details Name: Randall [name] MRN: [id number] DOB: [birth year] Today's Date: 12/23/2021  History of Present Illness  Randall [name] is a 61 y.o. male seen for evaluation of recurrent vomiting at the request of Dr. Judd Gaudier. Patient has a PMH of dementia, hypertension, diabetes, chronic kidney disease, CHF.  Patient presented to the [facility] ED for chief complaint of nausea, vomiting and weakness. Upon presentation to the ED yesterday, vital signs were stable with blood pressure 120/72, heart rate 67, respirations 18, temperature 98.7, pulse oximetry 97%. Labs were significant for new AKI with Cr 3.5, normal electrolytes GFR 19, glucose 176, WBC 17.9, UA unremarkable.  Imaging studies revealed CT of the abdomen pelvis revealed a possible tiny hiatal hernia, constipation, otherwise no acute intra-abdominal or intrapelvic abnormality.  Aortic atherosclerosis.  He is being admitted for IV hydration.  GI is consulted for evaluation and management of recurrent vomiting.  Clinical Impression  The pt presents with flat affect and lethargy. He is able to follow 1 step commands, but needs constant cueing for safety. The pt is expected to progress well with PT and should be able to d/c home with 24/7 assistance and home health PT.        Recommendations for follow up therapy are one component of a multi-disciplinary discharge planning process, led by the attending physician.  Recommendations may be updated based on patient status, additional functional criteria and insurance authorization.  Follow Up Recommendations Home health PT    Assistance Recommended at Discharge Frequent or constant Supervision/Assistance  Patient can return home with the following  A little help with walking and/or transfers;A little help with bathing/dressing/bathroom;Help with stairs or ramp for entrance;Assist for transportation;Direct supervision/assist for financial management;Direct  supervision/assist for medications management    Equipment Recommendations Rolling walker (2 wheels)  Recommendations for Other Services       Functional Status Assessment Patient has had a recent decline in their functional status and demonstrates the ability to make significant improvements in function in a reasonable and predictable amount of time.     Precautions / Restrictions Precautions Precautions: Fall Restrictions Weight Bearing Restrictions: No      Mobility  Bed Mobility Overal bed mobility: Modified Independent                  Transfers Overall transfer level: Needs assistance Equipment used: Rolling walker (2 wheels), None Transfers: Sit to/from Stand Sit to Stand: Supervision, Min guard                Ambulation/Gait Ambulation/Gait assistance: Min assist Gait Distance (Feet): 50 Feet Assistive device: Rolling walker (2 wheels)            Stairs            Wheelchair Mobility    Modified Rankin (Stroke Patients Only)       Balance           Standing balance support: During functional activity Standing balance-Leahy Scale: Fair                               Pertinent Vitals/Pain Pain Assessment Pain Assessment: 0-10 Pain Score: 8  Pain Location: L shoulder (chronic) Pain Descriptors / Indicators: Aching Pain Intervention(s): Monitored during session, Limited activity within patient's tolerance    Home Living Family/patient expects to be discharged to:: Private residence Living Arrangements: Other relatives (Sibiling) Available Help at Discharge: Family;Available 24 hours/day  Type of Home: House Home Access: Stairs to enter Entrance Stairs-Rails: None Entrance Stairs-Number of Steps: 4-5   Home Layout: One level Home Equipment: None      Prior Function Prior Level of Function : Needs assist             Mobility Comments: pt ambulating without assist or AD per sister ADLs Comments: Per  sister, pt was indep with basic ADL, always enjoyed caring for himself and cared about his appearance, dressed sharp, etc. Sister reports that pt was told ~2 wks ago by a MD that he should not drive anymore which upset the pt. Sister reports pt has tried to go start of the car and sister had to hide his keys from him. Sister also took over managing the pt's medications (blister packs) ~3wks ago. Sister reports pt's wife filed for divorce recently and pt has been upset about it.     Hand Dominance   Dominant Hand: Right    Extremity/Trunk Assessment   Upper Extremity Assessment Upper Extremity Assessment: Defer to OT evaluation LUE Deficits / Details: hx chronic shoulder pain, limiting shoulder flexion to less than 45* AROM    Lower Extremity Assessment Lower Extremity Assessment: Generalized weakness    Cervical / Trunk Assessment Cervical / Trunk Assessment: Normal  Communication   Communication: Other (comment) (soft spoken)  Cognition Arousal/Alertness: Awake/alert Behavior During Therapy: Flat affect Overall Cognitive Status: Impaired/Different from baseline Area of Impairment: Safety/judgement, Problem solving, Following commands                       Following Commands: Follows one step commands consistently Safety/Judgement: Decreased awareness of deficits   Problem Solving: Slow processing, Requires verbal cues General Comments: Grossly WFL, pt soft spoken, tends to keep eyes closed (reports this helps his nausea feel better), follows commands well but does require VC for sequencing/RW mgt during session        General Comments      Exercises Other Exercises Other Exercises: 30s STS: 2   Assessment/Plan    PT Assessment Patient needs continued PT services  PT Problem List Decreased strength;Decreased mobility;Decreased balance;Decreased activity tolerance       PT Treatment Interventions      PT Goals (Current goals can be found in the Care Plan  section)  Acute Rehab PT Goals Patient Stated Goal: Improve mobility PT Goal Formulation: With family Time For Goal Achievement: 01/06/22 Potential to Achieve Goals: Fair    Frequency Min 2X/week     Co-evaluation               AM-PAC PT "6 Clicks" Mobility  Outcome Measure Help needed turning from your back to your side while in a flat bed without using bedrails?: A Little Help needed moving from lying on your back to sitting on the side of a flat bed without using bedrails?: A Little Help needed moving to and from a bed to a chair (including a wheelchair)?: A Little Help needed standing up from a chair using your arms (e.g., wheelchair or bedside chair)?: A Little Help needed to walk in hospital room?: A Little Help needed climbing 3-5 steps with a railing? : A Lot 6 Click Score: 17    End of Session Equipment Utilized During Treatment: Gait belt Activity Tolerance: Patient limited by fatigue Patient left: in bed;with bed alarm set;with family/visitor present Nurse Communication: Mobility status PT Visit Diagnosis: Unsteadiness on feet (R26.81);Muscle weakness (generalized) (M62.81);Difficulty in walking, not  elsewhere classified (R26.2)    Time: 4401-0272 PT Time Calculation (min) (ACUTE ONLY): 17 min   Charges:   PT Evaluation $PT Eval Moderate Complexity: 1 Mod PT Treatments $Gait Training: 8-22 mins        4:55 PM, 12/23/21 Maudene Stotler A. Saverio Danker PT, DPT Physical Therapist - [facility]   Diyari Cherne A Tre Sanker 12/23/2021, 4:54 PM

## 2021-12-23 NOTE — H&P (Signed)
History and Physical    Patient: Randall [name] ZDG:[id number] DOB: 1960-11-10 DOA: 12/22/2021 DOS: the patient was seen and examined on 12/23/2021 PCP: Marlowe Aschoff, MD  Patient coming from: Home  Chief Complaint:  Chief Complaint  Patient presents with   Emesis    HPI: Randall [name] is a 61 y.o. male with medical history significant of Dementia, HTN, systolic heart failure with improved EF to 50%, CKD 3a and history of CVA who presents to the ED with a complaint of vomiting.  Patient was hospitalized 2 weeks ago for vomiting with negative work-up, presenting with altered mental status.  Patient's sister and caregiver believes it has to do with the Namenda in which she was recently started for his dementia.  He has had no fever or chills, abdominal pain or diarrhea.  ED course: Vital signs unremarkable Blood work significant for WBC 17,900, creatinine 3.57 up from baseline of 1.6 to Lipase and LFTs WNL, urinalysis unremarkable COVID and flu negative CT abdomen and pelvis showing constipation with otherwise no acute intracranial abnormality  Patient given an IV fluid bolus and hospitalist consulted for admission.  Review of Systems: unable to review all systems due to the inability of the patient to answer questions. Limited by dementia Past Medical History:  Diagnosis Date   Dementia ([place])    Diabetes mellitus without complication ([place])    Hypertension    History reviewed. No pertinent surgical history. Social History:  reports that he has been smoking cigarettes. He has been smoking an average of .5 packs per day. He does not have any smokeless tobacco history on file. He reports that he does not drink alcohol and does not use drugs.  Allergies  Allergen Reactions   Penicillins     Reaction in childhood     No family history on file.  Prior to Admission medications   Medication Sig Start Date End Date Taking? Authorizing Provider  amLODipine (NORVASC)  10 MG tablet Take 10 mg by mouth daily. 08/19/21   [provider]  aspirin 81 MG EC tablet Take 81 mg by mouth daily. 09/04/21   [provider]  carvedilol (COREG) 25 MG tablet Take 25 mg by mouth 2 (two) times daily. 09/04/21 03/03/22  [provider]  cefUROXime (CEFTIN) 500 MG tablet Take 1 tablet (500 mg total) by mouth 2 (two) times daily with a meal. Start 2/5 AM 12/10/21   Samuella Cota, MD  memantine [facility]) 5 MG tablet Take as directed by your doctor. 12/10/21   Samuella Cota, MD  rosuvastatin (CRESTOR) 40 MG tablet Take 40 mg by mouth daily. 09/04/21   [provider]  sacubitril-valsartan (ENTRESTO) 97-103 MG Take 1 tablet by mouth 2 (two) times daily. 10/03/21 10/03/22  [provider]  sertraline (ZOLOFT) 100 MG tablet Take 100 mg by mouth daily. 08/19/21   [provider]  tamsulosin (FLOMAX) 0.4 MG CAPS capsule Take 0.4 mg by mouth daily. 12/08/21 12/08/22  [provider]    Physical Exam: Vitals:   12/22/21 2310 12/22/21 2327 12/22/21 2330 12/23/21 0030  BP: (!) 168/80  (!) 149/66 (!) 153/76  Pulse: 66  70 76  Resp: 18  18 18   Temp:  98.9 F (37.2 C)    TempSrc:  Rectal    SpO2: 98%  98% 98%  Weight:      Height:        Physical Exam Constitutional:      General: He is not  in acute distress.    Appearance: Normal appearance. He is not ill-appearing.  HENT:     Head: Normocephalic and atraumatic.  Cardiovascular:     Rate and Rhythm: Normal rate and regular rhythm.  Pulmonary:     Effort: Pulmonary effort is normal.     Breath sounds: Normal breath sounds.  Abdominal:     General: Abdomen is flat. Bowel sounds are normal.     Palpations: Abdomen is soft.  Musculoskeletal:     Cervical back: Normal range of motion and neck supple.  Skin:    General: Skin is warm and dry.  Neurological:     General: No focal deficit present.     Mental Status: Mental status is at baseline.  Psychiatric:         Mood and Affect: Mood normal.        Behavior: Behavior normal.   Data Reviewed: Notes from primary care and specialist visits, past discharge summaries. Prior diagnostic testing as applicable to current admission diagnoses Updated medications and problem lists for reconciliation ED course, including vitals, labs, imaging, treatment and response to treatment Triage notes and ED providers notes   Assessment and Plan: * Recurrent vomiting- (present on admission) Recurrent episodes with second hospitalization in 2 weeks for the same No definite etiology identified IV hydration, IV antiemetics and IV Protonix Nutritionist evaluation GI consult for consideration of endoscopy  Acute renal failure superimposed on stage 3a chronic kidney disease ([place]) IV hydration Monitor renal function and avoid nephrotoxins  Leukocytosis Likely secondary to vomiting Acute infection not suspected  History of CVA (cerebrovascular accident) Continue aspirin and statin  Chronic systolic CHF (congestive heart failure) ([place])- (present on admission) Currently euvolemic Continue Coreg, Carvedilol and Entresto pending med rec  Dementia ([place])- (present on admission) Continue Namenda and sertraline       Advance Care Planning:   Code Status: Full Code   Consults: GI  Family Communication: GI  Severity of Illness: The appropriate patient status for this patient is OBSERVATION. Observation status is judged to be reasonable and necessary in order to provide the required intensity of service to ensure the patient's safety. The patient's presenting symptoms, physical exam findings, and initial radiographic and laboratory data in the context of their medical condition is felt to place them at decreased risk for further clinical deterioration. Furthermore, it is anticipated that the patient will be medically stable for discharge from the hospital within 2 midnights of admission.   Author: Athena Masse, MD 12/23/2021 1:27 AM  For on call review [email or web address].

## 2021-12-23 NOTE — Progress Notes (Signed)
°  INTERVAL PROGRESS NOTE    ANASTASIOS MELANDER- 61 y.o. male  LOS: 0 __________________________________________________________________  SUBJECTIVE: Admitted 12/22/2021 with cc of refractory emesis Since admission, patient remains hemodynamically stable. No more emesis.   OBJECTIVE: Blood pressure 133/65, pulse 82, temperature 98.9 F (37.2 C), temperature source Rectal, resp. rate 13, height 5\' 9"  (1.753 m), weight 76.7 kg, SpO2 97 %.  General: NAD, pleasant, able to participate in exam with a lot of coaching and encouragement Cardiac: RRR, normal heart sounds, no murmurs. 2+ radial and PT pulses bilaterally Respiratory: CTAB, normal effort, No wheezes, rales or rhonchi Abdomen: soft, nontender, nondistended, no hepatic or splenomegaly, +BS Extremities: no edema. WWP. Skin: warm and dry, no rashes noted Neuro: alert and oriented, no focal deficits Psych: flat affect and mood   ASSESSMENT/PLAN:  I have reviewed the full H&P by Dr. Damita Dunnings, and I agree with the assessment and plan as outlined therein. In addition:  Notified by nurse of concern for his decreased mental state and failed bedside swallow. Went to evaluate at bedside and with a significant amount of coaching and encouragement, patient was able to engage in exam. He was alert and oriented with symmetrical movements and strength. No new neurological concerns for me.  He denies nausea or abdominal currently.  I learn that patient recently had significant event occur which was passing of his wife and that these symptoms have started after.   Fatigue is likely multifactorial and Differential includes dehydration/malnutrition, depression/anhedonia - psych consult for evaluation - repeat metabolic labs to help rule out additional contributions - PT/OT  Refractory emesis/abdominal pain potentially based on constipation seen on imaging. Of note, hgb had significant decrease since admission from 13.0 to 11.5 today which could  be partially dilutional. No reported bleeding.  - GI input appreciated  AKI- significantly worsened in past two weeks and today. 1.39>>>>4.58. with a mild increase in BNP, will consider if fluid overloaded but based on euvolemic exam and poor intake, dehydration is most likely cause.  - increase mIVfluids and continue to monitor kidney function.   - if Cr not improving tomorrow, will get nephrology consult  Dysphagia- unknown cause.  - SLP consult  Principal Problem:   Recurrent vomiting Active Problems:   Dementia (H[facility])   Chronic systolic CHF (congestive heart failure) (H[facility])   History of CVA (cerebrovascular accident)   Acute renal failure superimposed on stage 3a chronic kidney disease ([place])   Leukocytosis    Richarda Osmond, DO [facility] 12/23/2021, 8:06 AM    [email or web address] Available by Epic secure chat 7AM-7PM. If 7PM-7AM, please contact night-coverage   No Charge

## 2021-12-23 NOTE — ED Notes (Signed)
Informed RN bed assigned 

## 2021-12-23 NOTE — Assessment & Plan Note (Addendum)
Currently euvolemic -Continue Coreg, Carvedilol  -Holding Entresto due to AKI

## 2021-12-23 NOTE — Progress Notes (Signed)
Anticoagulation monitoring(Lovenox):  61 yo male ordered Lovenox 40 mg Q24h    Filed Weights   12/22/21 1802  Weight: 76.7 kg (169 lb)   BMI 25    Lab Results  Component Value Date   CREATININE 3.57 (H) 12/22/2021   CREATININE 1.62 (H) 12/10/2021   CREATININE 1.39 (H) 12/09/2021   Estimated Creatinine Clearance: 22 mL/min (A) (by C-G formula based on SCr of 3.57 mg/dL (H)). Hemoglobin & Hematocrit     Component Value Date/Time   HGB 13.0 12/22/2021 1802   HCT 39.3 12/22/2021 1802     Per Protocol for Patient with estCrcl < 30 ml/min and BMI < 40, will transition to Lovenox 30 mg Q24h.

## 2021-12-23 NOTE — Consult Note (Addendum)
GI Inpatient Consult Note  Reason for Consult: Recurrent vomiting   Attending Requesting Consult: Dr. Judd Gaudier  History of Present Illness: Randall [name] is a 61 y.o. male seen for evaluation of recurrent vomiting at the request of Dr. Judd Gaudier. Patient has a PMH of dementia, hypertension, diabetes, chronic kidney disease, CHF, Frailty.  Patient presented to the [facility] ED for chief complaint of nausea, vomiting and weakness. Upon presentation to the ED yesterday, vital signs were stable with blood pressure 120/72, heart rate 67, respirations 18, temperature 98.7, pulse oximetry 97%. Labs were significant for new AKI with Cr 3.5, normal electrolytes GFR 19, glucose 176, WBC 17.9, UA unremarkable.  Imaging studies revealed CT of the abdomen pelvis revealed a possible tiny hiatal hernia, constipation, otherwise no acute intra-abdominal or intrapelvic abnormality.  Aortic atherosclerosis.  He is being admitted for IV hydration.  GI is consulted for evaluation and management of recurrent vomiting. A CT of head on 12/09/2021 for vomiting and altered mental status revealed chronic white matter ischemic changes without acute abnormality.    Last Colonoscopy: 07/27/2015: Screening:    - Diverticulosis in the sigmoid colon, in the descending                      colon and in the ascending colon.                     - The examination was otherwise normal.                     - No specimens collected. Endoscopy: 07/27/2015:    - Benign-appearing esophageal stenosis. Dilated with                      passage of adult endoscope. No further dilation                      attempted today.                     - Food in the middle third of the esophagus.                     - Medium-sized hiatus hernia.                     - Normal examined duodenum.                     - No specimens collected. Last EGD 03/03/2015: GI - Sent for EGD for esophageal stricture: Imp: Impression:  - Pill were found in the  esophagus. Removal was successful. - Benign-appearing esophageal stenosis. Dilated. Biopsied. - Gastritis. Biopsied. - Small hiatus hernia. - Erythematous duodenopathy.  Randall [name] is evaluated in the emergency department with his Sister Mechele Claude present.  The patient is resting with his eyes closed, and has limited verbalization.  He is able to give his name, date of birth, and identify that he is in the hospital.  He does not know why he is in the hospital.  Patient needs prompting to answer the simple questions and is unable to provide history otherwise.   According to his sister, Mechele Claude, patient has become very sleepy, weak, mild confusion, and increased coughing over the last 3 days.  She thinks it may be due to the new start Namenda pill . He was fine for the first week  and then last week, when the dose was increased to twice daily, he became more lethargic.  He took one Namenda pill yesterday and his PCP told him to stop it. Mechele Claude reports he was driving, and shopping 3 weeks ago.    He has been having problems eating and coughing up food when he eats.  Mechele Claude notices that he has been able to take a handful of pills, but has difficulty eating food.  She reports he has been drinking liquids well. The patient is able to say that he has not had nausea, or any abdominal pain.  He is not able to give any details regarding his swallowing. History is inconsistent. Nursing staff in the ED reports that he failed the swallowing a bedside test with small amount of applesauce, crushed pills, and water.  He was unable to swallow these items.  He was hospitalized 12/09/2020: with acute metabolic encephalopathy superimposed on dementia, N/V.  The records show that his wife made him an egg sandwich and became concerned when he vomited and began to "talk out of his head".  He was treated for symptomatic UTI with clinical resolution of encephalopathy and was back to baseline.  He had elevated lipase, stage III chronic  kidney disease with creatinine at baseline 1.5-1.7.  History of CVA and remains on aspirin and rosuvastatin.  History of CHF with LVEF 50% on Coreg, amlodipine, and Entresto.  Minimally elevated troponin, EKG nonacute.  Past Medical History:  Past Medical History:  Diagnosis Date   Dementia ([place])    Diabetes mellitus without complication ([place])    Hypertension     Problem List: Patient Active Problem List   Diagnosis Date Noted   Recurrent vomiting 12/23/2021   Acute renal failure superimposed on stage 3a chronic kidney disease ([place]) 12/23/2021   Leukocytosis 12/23/2021   UTI (urinary tract infection) 12/10/2021   Dementia ([place]) 67/20/9470   Acute metabolic encephalopathy 96/28/3662   Nausea and vomiting 12/09/2021   Elevated troponin 94/76/5465   Chronic systolic CHF (congestive heart failure) ([place]) 12/09/2021   History of CVA (cerebrovascular accident) 12/09/2021   Stage 3a chronic kidney disease ([place]) 12/09/2021   Elevated lipase 12/09/2021   Hypertensive urgency 12/09/2021    Past Surgical History: History reviewed. No pertinent surgical history.  Allergies: Allergies  Allergen Reactions   Penicillins     Reaction in childhood     Home Medications: (Not in a hospital admission)  Home medication reconciliation was completed with the patient.   Scheduled Inpatient Medications:    amLODipine  10 mg Oral Daily   carvedilol  25 mg Oral BID WC   enoxaparin (LOVENOX) injection  30 mg Subcutaneous Q24H   pantoprazole (PROTONIX) IV  40 mg Intravenous Q24H   rosuvastatin  40 mg Oral Daily   tamsulosin  0.4 mg Oral Daily    Continuous Inpatient Infusions:    sodium chloride 125 mL/hr at 12/22/21 2310    PRN Inpatient Medications:  acetaminophen **OR** acetaminophen, ondansetron **OR** ondansetron (ZOFRAN) IV  Family History: family history is not on file.  The patient's family history is negative for inflammatory bowel disorders, GI malignancy, or solid organ  transplantation.  Social History:   reports that he has been smoking cigarettes. He has been smoking an average of .5 packs per day. He does not have any smokeless tobacco history on file. He reports that he does not drink alcohol and does not use drugs. The patient denies ETOH. Positive smoking.    Review of  Systems: Constitutional: Unable to obtain with patient's decreased mentation.  Physical Examination: BP (!) 150/73 (BP Location: Left Arm)    Pulse 78    Temp 99 F (37.2 C) (Oral)    Resp 13    Ht 5\' 9"  (1.753 m)    Wt 76.7 kg    SpO2 100%    BMI 24.96 kg/m  Gen: He is sleeping, opens eyes to his name, oriented  to person, DOB, place. Somnolent, follows simple commands and falls back to sleep. Unable to rouse and obtain history.  HEENT: Anicteric, Not able to track EOMI Neck: supple, no JVD or thyromegaly Chest: CTA bilaterally, no wheezes, crackles, or other adventitious sounds CV: RRR, syst m  Abd: soft, NT, ND, +BS in all four quadrants; no HSM, guarding, rigidity, or rebound tenderness Ext: no edema Skin: no rash or lesions noted  Data: Lab Results  Component Value Date   WBC 17.9 (H) 12/22/2021   HGB 13.0 12/22/2021   HCT 39.3 12/22/2021   MCV 96.6 12/22/2021   PLT 255 12/22/2021   Recent Labs  Lab 12/22/21 1802  HGB 13.0   Lab Results  Component Value Date   NA 139 12/22/2021   K 3.8 12/22/2021   CL 105 12/22/2021   CO2 26 12/22/2021   BUN 33 (H) 12/22/2021   CREATININE 3.57 (H) 12/22/2021   Lab Results  Component Value Date   ALT 14 12/22/2021   AST 18 12/22/2021   ALKPHOS 67 12/22/2021   BILITOT 0.5 12/22/2021   No results for input(s): APTT, INR, PTT in the last 168 hours.  Assessment/Plan: Mr. Tanori is a 61 y.o. male with  a PMH of dementia, hypertension, diabetes, chronic kidney disease, CHF, CVA.  He has a history of esophageal strictures with most recent EGD dilatation performed 2016 per record review.  Family reports that he has had acute  problem 3 days prior to arrival of lethargy, weakness, sleepiness, mild confusion, and inability to eat well over the last 3 days.  He has vomiting.  Unable to identify whether he has actual solid food dysphagia.  Family reports he has been able to drink liquids until the last 3 days. No witness of coughing or choking spells with eating or drinking. He presents with acute renal failure superimposed on stage III chronic kidney disease, leukocytosis, altered mental status.  Nursing staff reports that he failed a bedside swallow test with inability to swallow applesauce and water. A CT of head on 12/09/2021 for vomiting and altered mental status revealed chronic white matter ischemic changes without acute abnormality.  Constipation: CT study for abdominal pain nonlocalized obtained yesterday shows stool throughout the colon.  No evidence of bowel wall or thickening.  His sister reports he has not had a bowel movement in 2 days.  Prior to that, he was using the bathroom spontaneously and she believes he was having regular bowel movements.  Recommendations: Defer luminal evaluation with EGD until after leukocytosis, change in mental status, AKI are further evaluated. He is receiving  hydration. He is receiving IV Protonix.  He has a history of benign esophageal stricture with symptoms of vomiting up food per 2016 review. We have considered noninvasive evaluation of the esophagus with barium swallow, but according to nursing staff, patient's ability to participate with this study is unlikely.  A swallowing study with speech therapist is pending to evaluate risk of aspiration.  Consider Neurology consult for altered mental status superimposed on dementia.   For constipation, he  may be unable to swallow MiraLax or oral laxatives. If he cannot take laxatives by mouth, recommend treating constipation with enemas.   For leukocytosis and vomiting history consider CXR to r/o aspiration pneumonia. CT abd/pelvis showed no  acute changes. No gallstones.  No evidence of bowel wall thickening or dilatation.  Positive aortic atherosclerosis.  Recommend treatment for constipation.   This case was discussed with Dr. Virgina Jock and further GI recommendations pending.   Thank you for the consult. Please call with questions or concerns.  Denice Paradise, [facility] [phone]

## 2021-12-23 NOTE — Evaluation (Signed)
Occupational Therapy Evaluation Patient Details Name: Randall [name] MRN: [id number] DOB: [birth year] Today's Date: 12/23/2021   History of Present Illness Randall [name] is a 61 y.o. male seen for evaluation of recurrent vomiting at the request of Dr. Judd Gaudier. Patient has a PMH of dementia, hypertension, diabetes, chronic kidney disease, CHF.  Patient presented to the [facility] ED for chief complaint of nausea, vomiting and weakness. Upon presentation to the ED yesterday, vital signs were stable with blood pressure 120/72, heart rate 67, respirations 18, temperature 98.7, pulse oximetry 97%. Labs were significant for new AKI with Cr 3.5, normal electrolytes GFR 19, glucose 176, WBC 17.9, UA unremarkable.  Imaging studies revealed CT of the abdomen pelvis revealed a possible tiny hiatal hernia, constipation, otherwise no acute intra-abdominal or intrapelvic abnormality.  Aortic atherosclerosis.  He is being admitted for IV hydration.  GI is consulted for evaluation and management of recurrent vomiting.   Clinical Impression   Pt was seen for OT evaluation this date. Prior to hospital admission, pt was living with his sister in a 1 story home with 4-5 steps and no rails to enter. Per sister/pt, pt was ambulating without AD and indep with basic ADL. Sister has been assisting with medication mgt, cleaning, meal prep, and transportation over the past 2-3 weeks. Per sister, pt's MD told the pt to no longer drive and sister reports pt is having difficulty with this. Additionally, spouse recently requested a divorce, which is why pt is living with his sister now. At baseline, sister reports pt takes pride in his appearance. Pt demo's slight decrease in ADL independence and mobility this date. Pt required set up and supervision for standing and sitting ADL tasks, SBA for ADL transfers, and instruction in RW mgt. Pt reports 5/10 nausea throughout session with no exacerbating factors noted. Currently pt  demonstrates impairments as described below (See OT problem list) which functionally limit his ability to perform ADL/self-care tasks. Pt would benefit from skilled OT services to address noted impairments and functional limitations (see below for any additional details) in order to maximize safety and independence while minimizing falls risk and caregiver burden. Upon hospital discharge, recommend HHOT to maximize pt safety and return to functional independence during ADL/IADL, and particularly with meaningful occupational engagement to maximize quality of life.    Recommendations for follow up therapy are one component of a multi-disciplinary discharge planning process, led by the attending physician.  Recommendations may be updated based on patient status, additional functional criteria and insurance authorization.   Follow Up Recommendations  Home health OT    Assistance Recommended at Discharge Frequent or constant Supervision/Assistance  Patient can return home with the following A little help with walking and/or transfers;Assistance with cooking/housework;Assist for transportation;Direct supervision/assist for medications management;Direct supervision/assist for financial management;Help with stairs or ramp for entrance;A little help with bathing/dressing/bathroom    Functional Status Assessment  Patient has had a recent decline in their functional status and demonstrates the ability to make significant improvements in function in a reasonable and predictable amount of time.  Equipment Recommendations  BSC/3in1    Recommendations for Other Services       Precautions / Restrictions Precautions Precautions: Fall Restrictions Weight Bearing Restrictions: No      Mobility Bed Mobility Overal bed mobility: Modified Independent                  Transfers Overall transfer level: Needs assistance Equipment used: Rolling walker (2 wheels), None Transfers: Sit to/from Stand  Sit  to Stand: Supervision, Min guard                  Balance Overall balance assessment: Needs assistance Sitting-balance support: No upper extremity supported, Feet supported Sitting balance-Leahy Scale: Good     Standing balance support: Single extremity supported, No upper extremity supported, During functional activity Standing balance-Leahy Scale: Fair                             ADL either performed or assessed with clinical judgement   ADL                                         General ADL Comments: Pt able to doff and don socks seated EOB with supervision and demonstrating good dynamic sitting balance while doing so. Pt required SBA for ADL transfers, VC for RW mgt during session (does not use RW at baseline). Set up and supervision for grooming tasks both seated EOB and standing at sink.     Vision         Perception     Praxis      Pertinent Vitals/Pain Pain Assessment Pain Assessment: 0-10 Pain Score: 8  Pain Location: L shoulder (chronic) Pain Descriptors / Indicators: Aching Pain Intervention(s): Limited activity within patient's tolerance, Monitored during session, Patient requesting pain meds-RN notified     Hand Dominance Right   Extremity/Trunk Assessment Upper Extremity Assessment Upper Extremity Assessment: Generalized weakness;LUE deficits/detail LUE Deficits / Details: hx chronic shoulder pain, limiting shoulder flexion to less than 45* AROM   Lower Extremity Assessment Lower Extremity Assessment: Defer to PT evaluation   Cervical / Trunk Assessment Cervical / Trunk Assessment: Normal   Communication Communication Communication: Other (comment) (soft spoken)   Cognition Arousal/Alertness: Awake/alert Behavior During Therapy: Flat affect Overall Cognitive Status: Impaired/Different from baseline Area of Impairment: Safety/judgement, Problem solving, Following commands                        Following Commands: Follows one step commands inconsistently Safety/Judgement: Decreased awareness of deficits   Problem Solving: Slow processing, Requires verbal cues General Comments: Grossly WFL, pt soft spoken, tends to keep eyes closed (reports this helps his nausea feel better), follows commands well but does require VC for sequencing/RW mgt during session     General Comments       Exercises     Shoulder Instructions      Home Living Family/patient expects to be discharged to:: Private residence Living Arrangements: Other relatives (sister) Available Help at Discharge: Family;Available 24 hours/day Type of Home: House Home Access: Stairs to enter [employer] of Steps: 4-5 Entrance Stairs-Rails: None Home Layout: One level     Bathroom Shower/Tub: Teacher, early years/pre: Standard     Home Equipment: None          Prior Functioning/Environment Prior Level of Function : Needs assist             Mobility Comments: pt ambulating without assist or AD per sister ADLs Comments: Per sister, pt was indep with basic ADL, always enjoyed caring for himself and cared about his appearance, dressed sharp, etc. Sister reports that pt was told ~2 wks ago by a MD that he should not drive anymore which upset the pt. Sister reports pt has  tried to go start of the car and sister had to hide his keys from him. Sister also took over managing the pt's medications (blister packs) ~3wks ago. Sister reports pt's wife filed for divorce recently and pt has been upset about it.        OT Problem List: Decreased strength;Decreased range of motion;Pain;Decreased cognition;Decreased knowledge of use of DME or AE;Impaired balance (sitting and/or standing)      OT Treatment/Interventions: Self-care/ADL training;Therapeutic exercise;Therapeutic activities;Cognitive remediation/compensation;DME and/or AE instruction;Patient/family education;Balance training    OT  Goals(Current goals can be found in the care plan section) Acute Rehab OT Goals Patient Stated Goal: not be nauseated OT Goal Formulation: With patient/family Time For Goal Achievement: 01/06/22 Potential to Achieve Goals: Good ADL Goals Pt Will Transfer to Toilet: ambulating;with modified independence;regular height toilet (LRAD PRN) Pt Will Perform Toileting - Clothing Manipulation and hygiene: with modified independence Additional ADL Goal #1: Pt will perform all aspects of dressing with modified independence. Additional ADL Goal #2: Pt will identify at least 2 preferred/meaningful activities/occupations he intends to engage in upon discharge to support quality of life.  OT Frequency: Min 2X/week    Co-evaluation              AM-PAC OT "6 Clicks" Daily Activity     Outcome Measure Help from another person eating meals?: None Help from another person taking care of personal grooming?: A Little Help from another person toileting, which includes using toliet, bedpan, or urinal?: A Little Help from another person bathing (including washing, rinsing, drying)?: A Little Help from another person to put on and taking off regular upper body clothing?: None Help from another person to put on and taking off regular lower body clothing?: A Little 6 Click Score: 20   End of Session Equipment Utilized During Treatment: Rolling walker (2 wheels);Gait belt Nurse Communication: Patient requests pain meds  Activity Tolerance: Patient tolerated treatment well Patient left: in bed;with call bell/phone within reach;with family/visitor present;Other (comment) (seated EOB with PT for session)  OT Visit Diagnosis: Other abnormalities of gait and mobility (R26.89);Pain Pain - Right/Left: Left Pain - part of body: Shoulder                Time: 1457-1530 OT Time Calculation (min): 33 min Charges:  OT General Charges $OT Visit: 1 Visit OT Evaluation $OT Eval Moderate Complexity: 1 Mod OT  Treatments $Self Care/Home Management : 23-37 mins  Ardeth Perfect., MPH, MS, OTR/L ascom [phone] 12/23/21, 4:33 PM

## 2021-12-23 NOTE — Assessment & Plan Note (Signed)
Continue Namenda and sertraline

## 2021-12-23 NOTE — Consult Note (Signed)
[facility] Face-to-Face Psychiatry Consult   Reason for Consult: Consult for this 61 year old man with a history of dementia and depression brought to the hospital with worsening fatigue and change in mental state Referring Physician: Ouida Sills Patient Identification: Randall [name] MRN:  [id number] Principal Diagnosis: Moderate major depression ([place]) Diagnosis:  Principal Problem:   Moderate major depression (HCC) Active Problems:   Dementia (HCC)   Chronic systolic CHF (congestive heart failure) (HCC)   History of CVA (cerebrovascular accident)   Recurrent vomiting   Acute renal failure superimposed on stage 3a chronic kidney disease ([place])   Leukocytosis   Total Time spent with patient: 45 minutes  Subjective:   Randall [name] is a 61 y.o. male patient admitted with patient himself not able to offer any information at this time.  Much of the information from family at bedside.  HPI: Patient seen and chart reviewed.  61 year old man brought in by family with reports of recurrent vomiting at home.  This was new onset.  Patient also had become weaker less responsive seem more down.  Concern was raised by treatment team about possible depression.  When I came to see him the patient was awake but was barely able to answer any questions.  Energy level seemed to be very low.  I spoke with family who live with him who are at bedside.  They report that he has had some major stresses.  He is providers have suggested that he needs to stop driving and at the same time his wife notified him that she wanted a divorce.  Patient himself moaned slightly when these things are mention.  They do say that he has seemed more down and negative recently.  Deny having heard any suicidal statements.  The patient was recently started on Namenda by his outpatient provider and the family strongly feels that that medication coincided with the new onset of his nausea and weakness.  Past Psychiatric History: Past history  of treatment with Zoloft for depression..  Past dementia  Risk to Self:   Risk to Others:   Prior Inpatient Therapy:   Prior Outpatient Therapy:    Past Medical History:  Past Medical History:  Diagnosis Date   Dementia ([place])    Diabetes mellitus without complication ([place])    Hypertension    History reviewed. No pertinent surgical history. Family History: No family history on file. Family Psychiatric  History: See previous Social History:  Social History   Substance and Sexual Activity  Alcohol Use Never     Social History   Substance and Sexual Activity  Drug Use Never    Social History   Socioeconomic History   Marital status: Married    Spouse name: Not on file   Number of children: Not on file   Years of education: Not on file   Highest education level: Not on file  Occupational History   Not on file  Tobacco Use   Smoking status: Every Day    Packs/day: 0.50    Types: Cigarettes   Smokeless tobacco: Not on file  Substance and Sexual Activity   Alcohol use: Never   Drug use: Never   Sexual activity: Not Currently  Other Topics Concern   Not on file  Social History Narrative   Not on file   Social Determinants of Health   Financial Resource Strain: Not on file  Food Insecurity: Not on file  Transportation Needs: Not on file  Physical Activity: Not on file  Stress: Not  on file  Social Connections: Not on file   Additional Social History:    Allergies:   Allergies  Allergen Reactions   Penicillins     Reaction in childhood     Labs:  Results for orders placed or performed during the hospital encounter of 12/22/21 (from the past 48 hour(s))  Lipase, blood     Status: None   Collection Time: 12/22/21  6:02 PM  Result Value Ref Range   Lipase 33 11 - 51 U/L    Comment: Performed at [facility], [address] [place], [place] [address]  Comprehensive metabolic panel     Status: Abnormal   Collection Time: 12/22/21  6:02 PM   Result Value Ref Range   Sodium 139 135 - 145 mmol/L   Potassium 3.8 3.5 - 5.1 mmol/L   Chloride 105 98 - 111 mmol/L   CO2 26 22 - 32 mmol/L   Glucose, Bld 176 (H) 70 - 99 mg/dL    Comment: Glucose reference range applies only to samples taken after fasting for at least 8 hours.   BUN 33 (H) 6 - 20 mg/dL   Creatinine, Ser 3.57 (H) 0.61 - 1.24 mg/dL   Calcium 9.2 8.9 - 10.3 mg/dL   Total Protein 7.2 6.5 - 8.1 g/dL   Albumin 3.7 3.5 - 5.0 g/dL   AST 18 15 - 41 U/L   ALT 14 0 - 44 U/L   Alkaline Phosphatase 67 38 - 126 U/L   Total Bilirubin 0.5 0.3 - 1.2 mg/dL   GFR, Estimated 19 (L) >60 mL/min    Comment: (NOTE) Calculated using the CKD-EPI Creatinine Equation (2021)    Anion gap 8 5 - 15    Comment: Performed at [facility], [address] [place], [place] [address]  CBC     Status: Abnormal   Collection Time: 12/22/21  6:02 PM  Result Value Ref Range   WBC 17.9 (H) 4.0 - 10.5 K/uL   RBC 4.07 (L) 4.22 - 5.81 MIL/uL   Hemoglobin 13.0 13.0 - 17.0 g/dL   HCT 39.3 39.0 - 52.0 %   MCV 96.6 80.0 - 100.0 fL   MCH 31.9 26.0 - 34.0 pg   MCHC 33.1 30.0 - 36.0 g/dL   RDW 13.1 11.5 - 15.5 %   Platelets 255 150 - 400 K/uL   nRBC 0.0 0.0 - 0.2 %    Comment: Performed at [facility], [address] [place], [place] [address]  Resp Panel by RT-PCR (Flu A&B, Covid) Nasopharyngeal Swab     Status: None   Collection Time: 12/22/21 11:18 PM   Specimen: Nasopharyngeal Swab; Nasopharyngeal(NP) swabs in vial transport medium  Result Value Ref Range   SARS Coronavirus 2 by RT PCR NEGATIVE NEGATIVE    Comment: (NOTE) SARS-CoV-2 target nucleic acids are NOT DETECTED.  The SARS-CoV-2 RNA is generally detectable in upper respiratory specimens during the acute phase of infection. The lowest concentration of SARS-CoV-2 viral copies this assay can detect is 138 copies/mL. A negative result does not preclude SARS-Cov-2 infection and should not be used as the sole basis for  treatment or other patient management decisions. A negative result may occur with  improper specimen collection/handling, submission of specimen other than nasopharyngeal swab, presence of viral mutation(s) within the areas targeted by this assay, and inadequate number of viral copies(<138 copies/mL). A negative result must be combined with clinical observations, patient history, and epidemiological information. The expected result is Negative.  Fact Sheet for Patients:  EntrepreneurPulse.com.au  Fact Sheet for Healthcare Providers:  IncredibleEmployment.be  This test is no t yet approved or cleared by the [place] FDA and  has been authorized for detection and/or diagnosis of SARS-CoV-2 by FDA under an Emergency Use Authorization (EUA). This EUA will remain  in effect (meaning this test can be used) for the duration of the COVID-19 declaration under Section 564(b)(1) of the Act, 21 U.S.C.section 360bbb-3(b)(1), unless the authorization is terminated  or revoked sooner.       Influenza A by PCR NEGATIVE NEGATIVE   Influenza B by PCR NEGATIVE NEGATIVE    Comment: (NOTE) The Xpert Xpress SARS-CoV-2/FLU/RSV plus assay is intended as an aid in the diagnosis of influenza from Nasopharyngeal swab specimens and should not be used as a sole basis for treatment. Nasal washings and aspirates are unacceptable for Xpert Xpress SARS-CoV-2/FLU/RSV testing.  Fact Sheet for Patients: EntrepreneurPulse.com.au  Fact Sheet for Healthcare Providers: IncredibleEmployment.be  This test is not yet approved or cleared by the [place] FDA and has been authorized for detection and/or diagnosis of SARS-CoV-2 by FDA under an Emergency Use Authorization (EUA). This EUA will remain in effect (meaning this test can be used) for the duration of the COVID-19 declaration under Section 564(b)(1) of the Act, 21 U.S.C. section  360bbb-3(b)(1), unless the authorization is terminated or revoked.  Performed at [facility], [address] [place], [place] [address]   Urinalysis, Routine w reflex microscopic Urine, In & Out Cath     Status: Abnormal   Collection Time: 12/22/21 11:18 PM  Result Value Ref Range   Color, Urine YELLOW (A) YELLOW   APPearance HAZY (A) CLEAR   Specific Gravity, Urine 1.010 1.005 - 1.030   pH 5.0 5.0 - 8.0   Glucose, UA NEGATIVE NEGATIVE mg/dL   Hgb urine dipstick NEGATIVE NEGATIVE   Bilirubin Urine NEGATIVE NEGATIVE   Ketones, ur NEGATIVE NEGATIVE mg/dL   Protein, ur 30 (A) NEGATIVE mg/dL   Nitrite NEGATIVE NEGATIVE   Leukocytes,Ua NEGATIVE NEGATIVE   RBC / HPF 0-5 0 - 5 RBC/hpf   WBC, UA 0-5 0 - 5 WBC/hpf   Bacteria, UA RARE (A) NONE SEEN   Squamous Epithelial / LPF 0-5 0 - 5   Amorphous Crystal PRESENT     Comment: Performed at [facility], [address], [place], [place] [address]  Comprehensive metabolic panel     Status: Abnormal   Collection Time: 12/23/21 11:26 AM  Result Value Ref Range   Sodium 138 135 - 145 mmol/L   Potassium 3.6 3.5 - 5.1 mmol/L   Chloride 109 98 - 111 mmol/L   CO2 22 22 - 32 mmol/L   Glucose, Bld 108 (H) 70 - 99 mg/dL    Comment: Glucose reference range applies only to samples taken after fasting for at least 8 hours.   BUN 41 (H) 6 - 20 mg/dL   Creatinine, Ser 4.58 (H) 0.61 - 1.24 mg/dL   Calcium 8.3 (L) 8.9 - 10.3 mg/dL   Total Protein 5.8 (L) 6.5 - 8.1 g/dL   Albumin 2.9 (L) 3.5 - 5.0 g/dL   AST 16 15 - 41 U/L   ALT 12 0 - 44 U/L   Alkaline Phosphatase 58 38 - 126 U/L   Total Bilirubin 0.5 0.3 - 1.2 mg/dL   GFR, Estimated 14 (L) >60 mL/min    Comment: (NOTE) Calculated using the CKD-EPI Creatinine Equation (2021)    Anion gap  7 5 - 15    Comment: Performed at [facility], [address] [place], [place] [address]  CBC     Status: Abnormal   Collection Time: 12/23/21 11:26 AM  Result Value Ref Range    WBC 13.5 (H) 4.0 - 10.5 K/uL   RBC 3.60 (L) 4.22 - 5.81 MIL/uL   Hemoglobin 11.5 (L) 13.0 - 17.0 g/dL   HCT 34.5 (L) 39.0 - 52.0 %   MCV 95.8 80.0 - 100.0 fL   MCH 31.9 26.0 - 34.0 pg   MCHC 33.3 30.0 - 36.0 g/dL   RDW 13.1 11.5 - 15.5 %   Platelets 201 150 - 400 K/uL   nRBC 0.0 0.0 - 0.2 %    Comment: Performed at [facility], [address], [place], [place] [address]  Lactic acid, plasma     Status: None   Collection Time: 12/23/21 11:26 AM  Result Value Ref Range   Lactic Acid, Venous 0.7 0.5 - 1.9 mmol/L    Comment: Performed at [facility], [address], [place], [place] [address]  Brain natriuretic peptide     Status: Abnormal   Collection Time: 12/23/21 11:26 AM  Result Value Ref Range   B Natriuretic Peptide 206.5 (H) 0.0 - 100.0 pg/mL    Comment: Performed at [facility], [address] [place], [place] [address]  Lactic acid, plasma     Status: None   Collection Time: 12/23/21  1:46 PM  Result Value Ref Range   Lactic Acid, Venous 0.9 0.5 - 1.9 mmol/L    Comment: Performed at [facility], [address], [place], [place] [address]    Current Facility-Administered Medications  Medication Dose Route Frequency Provider Last Rate Last Admin   0.9 %  sodium chloride infusion   Intravenous Continuous Ward, Kristen N, DO 125 mL/hr at 12/23/21 1609 Infusion Verify at 12/23/21 1609   acetaminophen (TYLENOL) tablet 650 mg  650 mg Oral Q6H PRN Athena Masse, MD   650 mg at 12/23/21 1659   Or   acetaminophen (TYLENOL) suppository 650 mg  650 mg Rectal Q6H PRN Athena Masse, MD       amLODipine (NORVASC) tablet 10 mg  10 mg Oral Daily Judd Gaudier V, MD       ARIPiprazole (ABILIFY) tablet 1 mg  1 mg Oral Daily Aadan Chenier T, MD   1 mg at 12/23/21 1847   carvedilol (COREG) tablet 25 mg  25 mg Oral BID WC Judd Gaudier V, MD   25 mg at 12/23/21 1659   enoxaparin (LOVENOX) injection 30 mg  30 mg Subcutaneous Q24H Judd Gaudier  V, MD   30 mg at 12/23/21 0909   ondansetron (ZOFRAN) tablet 4 mg  4 mg Oral Q6H PRN Athena Masse, MD       Or   ondansetron [facility]) injection 4 mg  4 mg Intravenous Q6H PRN Athena Masse, MD       pantoprazole (PROTONIX) injection 40 mg  40 mg Intravenous Q24H Judd Gaudier V, MD   40 mg at 12/23/21 0909   rosuvastatin (CRESTOR) tablet 40 mg  40 mg Oral Daily Athena Masse, MD       sertraline (ZOLOFT) tablet 100 mg  100 mg Oral Daily Geoffery Aultman T, MD   100 mg at 12/23/21 1847   sorbitol, milk of mag, mineral oil, glycerin (SMOG) enema  960 mL Rectal Daily PRN Richarda Osmond, MD  tamsulosin (FLOMAX) capsule 0.4 mg  0.4 mg Oral Daily Athena Masse, MD        Musculoskeletal: Strength & Muscle Tone: decreased Gait & Station: unable to stand Patient leans: N/A            Psychiatric Specialty Exam:  Presentation  General Appearance: No data recorded Eye Contact:No data recorded Speech:No data recorded Speech Volume:No data recorded Handedness:No data recorded  Mood and Affect  Mood:No data recorded Affect:No data recorded  Thought Process  Thought Processes:No data recorded Descriptions of Associations:No data recorded Orientation:No data recorded Thought Content:No data recorded History of Schizophrenia/Schizoaffective disorder:No data recorded Duration of Psychotic Symptoms:No data recorded Hallucinations:No data recorded Ideas of Reference:No data recorded Suicidal Thoughts:No data recorded Homicidal Thoughts:No data recorded  Sensorium  Memory:No data recorded Judgment:No data recorded Insight:No data recorded  Executive Functions  Concentration:No data recorded Attention Span:No data recorded Recall:No data recorded Fund of Knowledge:No data recorded Language:No data recorded  Psychomotor Activity  Psychomotor Activity:No data recorded  Assets  Assets:No data recorded  Sleep  Sleep:No data recorded  Physical  Exam: Physical Exam Vitals and nursing note reviewed.  Constitutional:      Appearance: Normal appearance.  HENT:     Head: Normocephalic and atraumatic.     Mouth/Throat:     Pharynx: Oropharynx is clear.  Eyes:     Pupils: Pupils are equal, round, and reactive to light.  Cardiovascular:     Rate and Rhythm: Normal rate and regular rhythm.  Pulmonary:     Effort: Pulmonary effort is normal.     Breath sounds: Normal breath sounds.  Abdominal:     General: Abdomen is flat.     Palpations: Abdomen is soft.  Musculoskeletal:        General: Normal range of motion.  Skin:    General: Skin is warm and dry.  Neurological:     General: No focal deficit present.     Mental Status: He is alert. Mental status is at baseline.  Psychiatric:        Attention and Perception: He is inattentive.        Mood and Affect: Affect is blunt.        Speech: He is noncommunicative. Speech is delayed.        Behavior: Behavior is slowed.        Cognition and Memory: Cognition is impaired. Memory is impaired.   Review of Systems  Constitutional: Negative.   HENT: Negative.    Eyes: Negative.   Respiratory: Negative.    Cardiovascular: Negative.   Gastrointestinal: Negative.   Musculoskeletal: Negative.   Skin: Negative.   Neurological: Negative.   Psychiatric/Behavioral:  Positive for depression. Negative for substance abuse and suicidal ideas.   Blood pressure (!) 166/68, pulse (!) 58, temperature 99.2 F (37.3 C), temperature source Oral, resp. rate 16, height 5\' 9"  (1.753 m), weight 76.7 kg, SpO2 99 %. Body mass index is 24.96 kg/m.  Treatment Plan Summary: Medication management and Plan family strongly feel that starting Namenda may have coincided with his new symptoms.  It looks like that medicine has already been discontinued.  Flomax may have also been started recently but they do not seem to of isolated that and that is still being prescribed.  Reviewed his medication and it seems  that he is supposed to be on Zoloft 100 mg a day.  It was my impression that the family acknowledge that that medicine was being prescribed.  I have  restarted that medicine and also added 1 mg of Abilify as a boost to hopefully treat depression better.  Family is requesting some kind of referral for outpatient mental health treatment at discharge.  Case management may want to look into that.  Disposition: Patient does not meet criteria for psychiatric inpatient admission.  Alethia Berthold, MD 12/23/2021 6:56 PM

## 2021-12-23 NOTE — Assessment & Plan Note (Addendum)
Worsening renal function despite gentle IV hydration. Nephrology was consulted and they decided to proceed with hemodialysis. HD catheter placed by vascular surgery and patient will receive his first session today. -Monitor renal function -Avoid nephrotoxins

## 2021-12-23 NOTE — Evaluation (Addendum)
Clinical/Bedside Swallow Evaluation Patient Details  Name: Randall [name] MRN: [id number] Date of Birth: 04-27-1961  Today's Date: 12/23/2021 Time: SLP Start Time (ACUTE ONLY): 1350 SLP Stop Time (ACUTE ONLY): 1450 SLP Time Calculation (min) (ACUTE ONLY): 60 min  Past Medical History:  Past Medical History:  Diagnosis Date   Dementia ([place])    Diabetes mellitus without complication ([place])    Hypertension    Past Surgical History: History reviewed. No pertinent surgical history. HPI:  Pt is a 61 y.o. male seen for evaluation of recurrent vomiting at the request of Dr. Judd Gaudier. Patient has a PMH of dementia, hypertension, diabetes, chronic kidney disease, CHF.  Patient presented to the [facility] ED for chief complaint of nausea, vomiting and weakness. Upon presentation to the ED yesterday, vital signs were stable with blood pressure 120/72, heart rate 67, respirations 18, temperature 98.7, pulse oximetry 97%. Labs were significant for new AKI with Cr 3.5, normal electrolytes GFR 19, glucose 176, WBC 17.9, UA unremarkable.  Imaging studies revealed CT of the abdomen pelvis revealed a possible tiny hiatal hernia, constipation, otherwise no acute intra-abdominal or intrapelvic abnormality.  Aortic atherosclerosis.  He is being admitted for IV hydration.  GI is consulted for evaluation and management of recurrent vomiting. CT of Abd: "Possible tiny hiatal hernia.  2. Constipation.  No acute abnormality of lower chest.".   Pt had recent admit ~10 days ago w/ acute confusion, UTI, emesis, and encephalopathy superimposed on Baseline Dementia.       Assessment / Plan / Recommendation  Clinical Impression  Pt appears to present w/ grossly adequate oropharyngeal phase swallow function in light of Baseline Cognitive decline; Dementia. No overt oropharyngeal phase dysphagia noted, no neuromuscular deficits noted. Alertness and Engagement appears impacted by Cognitive decline/Dementia. He Passively sat in  bed only feeding himself and engaging w/ others(minimally) when given direct verbal/tactile/visual stim. His Eyes were closed often. Suspect this presentation of decreased Engagement and Awareness in his environment around him could increase his risk for dysphagia/aspiration as well as impact his overall oral intake thus jeopardizing his ability to meet nutrition/hydration needs appropriately. Pt consumed po trials w/ No overt, clinical s/s of aspiration during the po trials. NSG present along w/ Wife in room.  Pt appears at reduced risk for aspiration following general aspiration precautions, given support and cues during oral intake tasks for follow through, and when using a slightly modified diet for ease of mastication -- also noted pt is missing some lower Dentition(molars). He has an Upper Denture plate that requires adhesive for secure fit.   During po trials, pt consumed all consistencies w/ no immediate, overt coughing, decline in vocal quality, or change in respiratory presentation during/post trials. Oral phase appeared grossly [facility] w/ timely bolus management and control of bolus propulsion for A-P transfer for swallowing. Mastication of increased textured foods was min more deliberate and more verbal cues were needed to direct oral intake. Oral clearing achieved w/ all trial consistencies. OM Exam appeared [facility] w/ no unilateral weakness noted. Speech Clear; low volume w/ mumbled/muttered speech. Pt fed self w/ setup support.   Recommend a more Mech Soft consistency diet w/ well-Cut meats, moistened foods for ease of mastication; Thin liquids VIA CUP - pt does not use straws at home and per recommendation. Recommend general aspiration precautions w/ feeding support and Supervision during meals to assist/cue when needed -- pt should not eat if not fully awake/alert. Reduce distractions during meals. Pills WHOLE in Puree for safer, easier  swallowing. Education given on Pills in Puree; food consistencies  and easy to eat options; general aspiration precautions to Sister and NSG present. Sister fully agreed. MD updated. NSG to reconsult if any new needs arise. NSG agreed. SLP Visit Diagnosis: Dysphagia, oral phase (R13.11) (possible impact from Baseline Cognitive decline; acute illness)    Aspiration Risk  Mild aspiration risk;Risk for inadequate nutrition/hydration (reduced following general aspiration precautions)    Diet Recommendation   Mech Soft consistency diet w/ well-Cut meats, moistened foods for ease of mastication; Thin liquids VIA CUP - pt does not use straws at home and per recommendation. Recommend general aspiration precautions w/ feeding support and Supervision during meals to assist/cue when needed -- pt should not eat if not fully awake/alert. Reduce distractions during meals.   Medication Administration: Whole meds with puree (vs need to Crush in puree)    Other  Recommendations Recommended Consults:  (GI following for N/V) Oral Care Recommendations: Oral care BID;Oral care before and after PO;Staff/trained caregiver to provide oral care (denture care) Other Recommendations:  (n/a)    Recommendations for follow up therapy are one component of a multi-disciplinary discharge planning process, led by the attending physician.  Recommendations may be updated based on patient status, additional functional criteria and insurance authorization.  Follow up Recommendations No SLP follow up      Assistance Recommended at Discharge Intermittent Supervision/Assistance  Functional Status Assessment Patient has not had a recent decline in their functional status  Frequency and Duration  (n/a)   (n/a)       Prognosis Prognosis for Safe Diet Advancement: Fair (-Good) Barriers to Reach Goals: Cognitive deficits;Language deficits;Time post onset;Severity of deficits;Behavior Barriers/Prognosis Comment: baseline Dementia      Swallow Study   General Date of Onset: 12/22/21 HPI: Pt is  a 61 y.o. male seen for evaluation of recurrent vomiting at the request of Dr. Judd Gaudier. Patient has a PMH of dementia, hypertension, diabetes, chronic kidney disease, CHF.  Patient presented to the [facility] ED for chief complaint of nausea, vomiting and weakness. Upon presentation to the ED yesterday, vital signs were stable with blood pressure 120/72, heart rate 67, respirations 18, temperature 98.7, pulse oximetry 97%. Labs were significant for new AKI with Cr 3.5, normal electrolytes GFR 19, glucose 176, WBC 17.9, UA unremarkable.  Imaging studies revealed CT of the abdomen pelvis revealed a possible tiny hiatal hernia, constipation, otherwise no acute intra-abdominal or intrapelvic abnormality.  Aortic atherosclerosis.  He is being admitted for IV hydration.  GI is consulted for evaluation and management of recurrent vomiting.  Pt had recent admit ~10 days ago w/ acute confusion, UTI, emesis, and encephalopathy superimposed on Baseline Dementia.  CT of Abd: "Possible tiny hiatal hernia.  2. Constipation.  No acute abnormality of lower chest.". Type of Study: Bedside Swallow Evaluation Previous Swallow Assessment: 12/10/2021 Diet Prior to this Study: Thin liquids;Dysphagia 3 (soft);Regular (cut meats/foods, moistened well) Temperature Spikes Noted: No (wbc 13.5 trending down) Respiratory Status: Room air History of Recent Intubation: No Behavior/Cognition: Alert;Cooperative;Pleasant mood;Distractible;Requires cueing (eyes closed a lot) Oral Cavity Assessment: Within Functional Limits Oral Care Completed by SLP: Yes Oral Cavity - Dentition: Dentures, top;Missing dentition (on bottom) Vision: Functional for self-feeding Self-Feeding Abilities: Able to feed self;Needs assist;Needs set up (eyes closed a lot) Patient Positioning: Upright in bed (needed positioning) Baseline Vocal Quality: Low vocal intensity (muttered/mumbled speech - few words) Volitional Cough: Strong (adequate)    Oral/Motor/Sensory  Function Overall Oral Motor/Sensory Function: Within functional limits (no  unilateral weakness during cursory assessment nor w/ bolus management)   Ice Chips Ice chips: Within functional limits Presentation: Spoon (fed; 3 trials) Other Comments: slow bolus management   Thin Liquid Thin Liquid: Within functional limits Presentation: Self Fed;Cup (helped to support initially but then was independent w/ feeding self)    Nectar Thick Nectar Thick Liquid: Not tested   Honey Thick Honey Thick Liquid: Not tested   Puree Puree: Within functional limits Presentation: Spoon (fed; 6-7 trials)   Solid     Solid: Impaired (minimal -- slower mastication at times) Presentation: Spoon;Self Fed (supported; 6 trials) Oral Phase Impairments: Impaired mastication (min slower) Pharyngeal Phase Impairments:  (none)        Orinda Kenner, MS, CCC-SLP Speech Language Pathologist Rehab Services; [place] [phone] (ascom) Mitchelle Goerner 12/23/2021,6:16 PM

## 2021-12-23 NOTE — Assessment & Plan Note (Signed)
Continue aspirin and statin. 

## 2021-12-23 NOTE — Assessment & Plan Note (Signed)
Likely secondary to vomiting Acute infection not suspected

## 2021-12-24 DIAGNOSIS — Z66 Do not resuscitate: Secondary | ICD-10-CM | POA: Diagnosis not present

## 2021-12-24 DIAGNOSIS — I5042 Chronic combined systolic (congestive) and diastolic (congestive) heart failure: Secondary | ICD-10-CM | POA: Diagnosis present

## 2021-12-24 DIAGNOSIS — K449 Diaphragmatic hernia without obstruction or gangrene: Secondary | ICD-10-CM | POA: Diagnosis present

## 2021-12-24 DIAGNOSIS — D631 Anemia in chronic kidney disease: Secondary | ICD-10-CM | POA: Diagnosis present

## 2021-12-24 DIAGNOSIS — N170 Acute kidney failure with tubular necrosis: Secondary | ICD-10-CM | POA: Diagnosis not present

## 2021-12-24 DIAGNOSIS — Z79899 Other long term (current) drug therapy: Secondary | ICD-10-CM | POA: Diagnosis not present

## 2021-12-24 DIAGNOSIS — D72829 Elevated white blood cell count, unspecified: Secondary | ICD-10-CM | POA: Diagnosis not present

## 2021-12-24 DIAGNOSIS — N186 End stage renal disease: Secondary | ICD-10-CM | POA: Diagnosis present

## 2021-12-24 DIAGNOSIS — E1122 Type 2 diabetes mellitus with diabetic chronic kidney disease: Secondary | ICD-10-CM | POA: Diagnosis present

## 2021-12-24 DIAGNOSIS — K269 Duodenal ulcer, unspecified as acute or chronic, without hemorrhage or perforation: Secondary | ICD-10-CM | POA: Diagnosis present

## 2021-12-24 DIAGNOSIS — K5900 Constipation, unspecified: Secondary | ICD-10-CM | POA: Diagnosis not present

## 2021-12-24 DIAGNOSIS — Z8673 Personal history of transient ischemic attack (TIA), and cerebral infarction without residual deficits: Secondary | ICD-10-CM

## 2021-12-24 DIAGNOSIS — K2100 Gastro-esophageal reflux disease with esophagitis, without bleeding: Secondary | ICD-10-CM | POA: Diagnosis present

## 2021-12-24 DIAGNOSIS — Z7982 Long term (current) use of aspirin: Secondary | ICD-10-CM | POA: Diagnosis not present

## 2021-12-24 DIAGNOSIS — I5022 Chronic systolic (congestive) heart failure: Secondary | ICD-10-CM

## 2021-12-24 DIAGNOSIS — K2980 Duodenitis without bleeding: Secondary | ICD-10-CM | POA: Diagnosis present

## 2021-12-24 DIAGNOSIS — F17210 Nicotine dependence, cigarettes, uncomplicated: Secondary | ICD-10-CM | POA: Diagnosis present

## 2021-12-24 DIAGNOSIS — K2970 Gastritis, unspecified, without bleeding: Secondary | ICD-10-CM | POA: Diagnosis present

## 2021-12-24 DIAGNOSIS — N179 Acute kidney failure, unspecified: Secondary | ICD-10-CM

## 2021-12-24 DIAGNOSIS — N1831 Chronic kidney disease, stage 3a: Secondary | ICD-10-CM

## 2021-12-24 DIAGNOSIS — R1110 Vomiting, unspecified: Secondary | ICD-10-CM | POA: Diagnosis not present

## 2021-12-24 DIAGNOSIS — Z880 Allergy status to penicillin: Secondary | ICD-10-CM | POA: Diagnosis not present

## 2021-12-24 DIAGNOSIS — Z992 Dependence on renal dialysis: Secondary | ICD-10-CM | POA: Diagnosis not present

## 2021-12-24 DIAGNOSIS — R112 Nausea with vomiting, unspecified: Secondary | ICD-10-CM | POA: Diagnosis present

## 2021-12-24 DIAGNOSIS — E871 Hypo-osmolality and hyponatremia: Secondary | ICD-10-CM | POA: Diagnosis not present

## 2021-12-24 DIAGNOSIS — I132 Hypertensive heart and chronic kidney disease with heart failure and with stage 5 chronic kidney disease, or end stage renal disease: Secondary | ICD-10-CM | POA: Diagnosis present

## 2021-12-24 DIAGNOSIS — T39395A Adverse effect of other nonsteroidal anti-inflammatory drugs [NSAID], initial encounter: Secondary | ICD-10-CM | POA: Diagnosis present

## 2021-12-24 DIAGNOSIS — Z20822 Contact with and (suspected) exposure to covid-19: Secondary | ICD-10-CM | POA: Diagnosis present

## 2021-12-24 DIAGNOSIS — F321 Major depressive disorder, single episode, moderate: Secondary | ICD-10-CM | POA: Diagnosis not present

## 2021-12-24 DIAGNOSIS — F0393 Unspecified dementia, unspecified severity, with mood disturbance: Secondary | ICD-10-CM | POA: Diagnosis present

## 2021-12-24 LAB — CBC
HCT: 33.7 % — ABNORMAL LOW (ref 39.0–52.0)
Hemoglobin: 11.4 g/dL — ABNORMAL LOW (ref 13.0–17.0)
MCH: 31.9 pg (ref 26.0–34.0)
MCHC: 33.8 g/dL (ref 30.0–36.0)
MCV: 94.4 fL (ref 80.0–100.0)
Platelets: 199 10*3/uL (ref 150–400)
RBC: 3.57 MIL/uL — ABNORMAL LOW (ref 4.22–5.81)
RDW: 12.7 % (ref 11.5–15.5)
WBC: 11 10*3/uL — ABNORMAL HIGH (ref 4.0–10.5)
nRBC: 0 % (ref 0.0–0.2)

## 2021-12-24 LAB — BASIC METABOLIC PANEL
Anion gap: 7 (ref 5–15)
BUN: 47 mg/dL — ABNORMAL HIGH (ref 6–20)
CO2: 18 mmol/L — ABNORMAL LOW (ref 22–32)
Calcium: 7.7 mg/dL — ABNORMAL LOW (ref 8.9–10.3)
Chloride: 107 mmol/L (ref 98–111)
Creatinine, Ser: 6.16 mg/dL — ABNORMAL HIGH (ref 0.61–1.24)
GFR, Estimated: 10 mL/min — ABNORMAL LOW (ref >60)
Glucose, Bld: 97 mg/dL (ref 70–99)
Potassium: 3.7 mmol/L (ref 3.5–5.1)
Sodium: 132 mmol/L — ABNORMAL LOW (ref 135–145)

## 2021-12-24 MED ORDER — PANTOPRAZOLE SODIUM 40 MG PO TBEC
40.0000 mg | DELAYED_RELEASE_TABLET | Freq: Every day | ORAL | Status: DC
  Administered 2021-12-25: 40 mg via ORAL
  Filled 2021-12-24 (×2): qty 1

## 2021-12-24 NOTE — TOC Progression Note (Addendum)
Transition of Care [facility]) - Progression Note    Patient Details  Name: Randall [name] MRN: [id number] Date of Birth: 1961-04-15  Transition of Care [facility] CM/SW Contact  Izola Price, RN Phone Number: 12/24/2021, 3:16 PM  Clinical Narrative:   2/18: Damaris Schooner with patient. Agreeable for [facility] but hopes to get better. No agency preference for [facility] or DME.  DME ordered from [employer] and requested provider for DME orders per PT/OT recommendations. [facility] contacted for [facility] PT/OT, requested/received order from provider. Simmie Davies RN CM   [employer] is not in network with [employer] so will need another DME company to deliver. Private pay would be $100 out of pocket.  TOC to monitor this. No success via [employer] or [facility].   Simmie Davies RN CM        Expected Discharge Plan and Services                                                 Social Determinants of Health (SDOH) Interventions    Readmission Risk Interventions No flowsheet data found.

## 2021-12-24 NOTE — Progress Notes (Signed)
PT Cancellation Note  Patient Details Name: Randall [name] MRN: [id number] DOB: 13-Feb-1961   Cancelled Treatment:    Reason Eval/Treat Not Completed: Patient at procedure or test/unavailable (Patient recieving bath upon time of PT attempt. Will attempt again at later time/date as available.)   Janna Arch, PT, DPT  12/24/2021, 11:32 AM

## 2021-12-24 NOTE — Consult Note (Signed)
Randall [name] , MD [address], [place], [place], [place], [address] [address], [place], [place], [place], [address] Phone: [phone]  Fax: [phone]   Randall [name] is being followed for nausea vomiting day 1 of follow up   Subjective: No complaints , when I went into the room he was having his lunch comfortably , family member by his side, no vomiting or nausea today .    Objective: Vital signs in last 24 hours: Vitals:   12/23/21 1606 12/23/21 1929 12/24/21 0355 12/24/21 0724  BP: (!) 166/68 (!) 146/72 (!) 162/87 (!) 174/75  Pulse: (!) 58 64 (!) 59 66  Resp: 16 16 16 18   Temp: 99.2 F (37.3 C) 98.7 F (37.1 C) 99 F (37.2 C) 98.4 F (36.9 C)  TempSrc: Oral Oral Oral Oral  SpO2: 99% 100% 99% 100%  Weight:      Height:       Weight change:   Intake/Output Summary (Last 24 hours) at 12/24/2021 1233 Last data filed at 12/23/2021 2836 Gross per 24 hour  Intake 2242.79 ml  Output --  Net 2242.79 ml     Exam: Appears comfortable- having his lunch , no vomiting    Lab Results: @LABTEST2 @ Micro Results: Recent Results (from the past 240 hour(s))  Resp Panel by RT-PCR (Flu A&B, Covid) Nasopharyngeal Swab     Status: None   Collection Time: 12/22/21 11:18 PM   Specimen: Nasopharyngeal Swab; Nasopharyngeal(NP) swabs in vial transport medium  Result Value Ref Range Status   SARS Coronavirus 2 by RT PCR NEGATIVE NEGATIVE Final    Comment: (NOTE) SARS-CoV-2 target nucleic acids are NOT DETECTED.  The SARS-CoV-2 RNA is generally detectable in upper respiratory specimens during the acute phase of infection. The lowest concentration of SARS-CoV-2 viral copies this assay can detect is 138 copies/mL. A negative result does not preclude SARS-Cov-2 infection and should not be used as the sole basis for treatment or other patient management decisions. A negative result may occur with  improper specimen collection/handling, submission of specimen other than  nasopharyngeal swab, presence of viral mutation(s) within the areas targeted by this assay, and inadequate number of viral copies(<138 copies/mL). A negative result must be combined with clinical observations, patient history, and epidemiological information. The expected result is Negative.  Fact Sheet for Patients:  EntrepreneurPulse.com.au  Fact Sheet for Healthcare Providers:  IncredibleEmployment.be  This test is no t yet approved or cleared by the [place] FDA and  has been authorized for detection and/or diagnosis of SARS-CoV-2 by FDA under an Emergency Use Authorization (EUA). This EUA will remain  in effect (meaning this test can be used) for the duration of the COVID-19 declaration under Section 564(b)(1) of the Act, 21 U.S.C.section 360bbb-3(b)(1), unless the authorization is terminated  or revoked sooner.       Influenza A by PCR NEGATIVE NEGATIVE Final   Influenza B by PCR NEGATIVE NEGATIVE Final    Comment: (NOTE) The Xpert Xpress SARS-CoV-2/FLU/RSV plus assay is intended as an aid in the diagnosis of influenza from Nasopharyngeal swab specimens and should not be used as a sole basis for treatment. Nasal washings and aspirates are unacceptable for Xpert Xpress SARS-CoV-2/FLU/RSV testing.  Fact Sheet for Patients: EntrepreneurPulse.com.au  Fact Sheet for Healthcare Providers: IncredibleEmployment.be  This test is not yet approved or cleared by the [place] FDA and has been authorized for detection and/or diagnosis of SARS-CoV-2 by FDA under an Emergency Use Authorization (EUA). This EUA will  remain in effect (meaning this test can be used) for the duration of the COVID-19 declaration under Section 564(b)(1) of the Act, 21 U.S.C. section 360bbb-3(b)(1), unless the authorization is terminated or revoked.  Performed at [facility], [address] [place], [place]  [address]    Studies/Results: CT ABDOMEN PELVIS WO CONTRAST  Result Date: 12/22/2021 CLINICAL DATA:  Abdominal pain, acute, nonlocalized EXAM: CT ABDOMEN AND PELVIS WITHOUT CONTRAST TECHNIQUE: Multidetector CT imaging of the abdomen and pelvis was performed following the standard protocol without IV contrast. RADIATION DOSE REDUCTION: This exam was performed according to the departmental dose-optimization program which includes automated exposure control, adjustment of the mA and/or kV according to patient size and/or use of iterative reconstruction technique. COMPARISON:  None. FINDINGS: Lower chest: No acute abnormality.  Possible tiny hiatal hernia. Hepatobiliary: No focal liver abnormality. No gallstones, gallbladder wall thickening, or pericholecystic fluid. No biliary dilatation. Pancreas: No focal lesion. Normal pancreatic contour. No surrounding inflammatory changes. No main pancreatic ductal dilatation. Spleen: Normal in size without focal abnormality. Adrenals/Urinary Tract: No adrenal nodule bilaterally. No nephrolithiasis and no hydronephrosis. No definite contour-deforming renal mass. No ureterolithiasis or hydroureter. The urinary bladder is unremarkable. Stomach/Bowel: Stomach is within normal limits. No evidence of bowel wall thickening or dilatation. Stool throughout the colon. The appendix is not definitely identified with no inflammatory changes in the right lower quadrant to suggest acute appendicitis. Vascular/Lymphatic: No abdominal aorta or iliac aneurysm. At least moderate atherosclerotic plaque of the aorta and its branches. No abdominal, pelvic, or inguinal lymphadenopathy. Reproductive: Prostate is unremarkable. Other: No intraperitoneal free fluid. No intraperitoneal free gas. No organized fluid collection. Musculoskeletal: No abdominal wall hernia or abnormality. No suspicious lytic or blastic osseous lesions. No acute displaced fracture. Multilevel degenerative changes of the spine.  IMPRESSION: 1. Possible tiny hiatal hernia. 2. Constipation. 3. Otherwise no acute intra-abdominal or intrapelvic abnormality. 4.  Aortic Atherosclerosis (ICD10-I70.0). Electronically Signed   By: Iven Finn M.D.   On: 12/22/2021 23:56   Medications: I have reviewed the patient's current medications. Scheduled Meds:  amLODipine  10 mg Oral Daily   ARIPiprazole  1 mg Oral Daily   carvedilol  25 mg Oral BID WC   enoxaparin (LOVENOX) injection  30 mg Subcutaneous Q24H   [START ON 12/25/2021] pantoprazole  40 mg Oral Daily   rosuvastatin  40 mg Oral Daily   sertraline  100 mg Oral Daily   tamsulosin  0.4 mg Oral Daily   Continuous Infusions:  sodium chloride 110 mL/hr at 12/24/21 0926   PRN Meds:.acetaminophen **OR** acetaminophen, ondansetron **OR** ondansetron (ZOFRAN) IV, sorbitol, milk of mag, mineral oil, glycerin (SMOG) enema  BMP Latest Ref Rng & Units 12/23/2021 12/22/2021 12/10/2021  Glucose 70 - 99 mg/dL 108(H) 176(H) 83  BUN 6 - 20 mg/dL 41(H) 33(H) 19  Creatinine 0.61 - 1.24 mg/dL 4.58(H) 3.57(H) 1.62(H)  Sodium 135 - 145 mmol/L 138 139 135  Potassium 3.5 - 5.1 mmol/L 3.6 3.8 3.3(L)  Chloride 98 - 111 mmol/L 109 105 104  CO2 22 - 32 mmol/L 22 26 25   Calcium 8.9 - 10.3 mg/dL 8.3(L) 9.2 8.7(L)    Assessment: Antamine whi Melvern Sample .age male was admitted yesterday with recurrent vomiting.  Past medical history dementia hypertension diabetes CKD.  CT scan of the abdomen showed tiny hiatal hernia, constant patient no acute intra-abdominal pathology.  GI was consulted and seen by Dr. Virgina Jock yesterday for recurrent vomiting.  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  Apparently he has been very sleepy, Coughing up food he had been drinking liquids quite well.  Failed a bedside swallow test.  EGD was deferred yesterday due to the change in mental status swallowing study was pending.  Neurology  consult was requested.  In addition has acute kidney injury with creatinine of 4.58.  Very likely has a metabolic encephalopathy from renal failure and effects of other meds which his family says ie memantamine which his family says made him very drowsy . He is more alert since d/c memantamine. Presently he has no nausea or vomiting and was eating his lunch . I agree with Dr Odessa Fleming plan for E\GD on Monday for which he has been scheduled when his Myra Rude would hopefully be better and can tolerate procedure better. Continue oral intake     LOS: 0 days   Randall Bellows, MD 12/24/2021, 12:33 PM

## 2021-12-24 NOTE — Discharge Summary (Incomplete)
Physician Discharge Summary  Patient: Randall [name] VVO:[id number] DOB: [birth year]   Code Status: Full Code Admit date: 12/22/2021 Discharge date: 12/24/2021 Disposition: {Plan; Disposition:26390}, [place] services:27085} PCP: Marlowe Aschoff, MD  Recommendations for Outpatient Follow-up:  Follow up with PCP within 1-2 weeks   Discharge Diagnoses:  Principal Problem:   Moderate major depression (HCC) Active Problems:   Dementia (HCC)   Chronic systolic CHF (congestive heart failure) (HCC)   History of CVA (cerebrovascular accident)   Recurrent vomiting   Acute renal failure superimposed on stage 3a chronic kidney disease ([place])   Leukocytosis  Brief Hospital Course Summary: Pt ***  Discharge Condition: {DISCHARGE CONDITION:19696}, improved Recommended discharge diet: {Discharge YIRS:[id number]}  Consultations: ***  Procedures/Studies: ***   Allergies as of 12/24/2021       Reactions   Penicillins    Reaction in childhood     Med Rec must be completed prior to using this [place]***        Subjective   Pt reports *** Objective  Blood pressure (!) 174/75, pulse 66, temperature 98.4 F (36.9 C), temperature source Oral, resp. rate 18, height 5\' 9"  (1.753 m), weight 76.7 kg, SpO2 100 %.   General: Pt is alert, awake, not in acute distress Cardiovascular: RRR, S1/S2 +, no rubs, no gallops Respiratory: CTA bilaterally, no wheezing, no rhonchi Abdominal: Soft, NT, ND, bowel sounds + Extremities: no edema, no cyanosis   The results of significant diagnostics from this hospitalization (including imaging, microbiology, ancillary and laboratory) are listed below for reference.   Imaging studies: CT ABDOMEN PELVIS WO CONTRAST  Result Date: 12/22/2021 CLINICAL DATA:  Abdominal pain, acute, nonlocalized EXAM: CT ABDOMEN AND PELVIS WITHOUT CONTRAST TECHNIQUE: Multidetector CT imaging of the abdomen and pelvis was performed following the standard protocol  without IV contrast. RADIATION DOSE REDUCTION: This exam was performed according to the departmental dose-optimization program which includes automated exposure control, adjustment of the mA and/or kV according to patient size and/or use of iterative reconstruction technique. COMPARISON:  None. FINDINGS: Lower chest: No acute abnormality.  Possible tiny hiatal hernia. Hepatobiliary: No focal liver abnormality. No gallstones, gallbladder wall thickening, or pericholecystic fluid. No biliary dilatation. Pancreas: No focal lesion. Normal pancreatic contour. No surrounding inflammatory changes. No main pancreatic ductal dilatation. Spleen: Normal in size without focal abnormality. Adrenals/Urinary Tract: No adrenal nodule bilaterally. No nephrolithiasis and no hydronephrosis. No definite contour-deforming renal mass. No ureterolithiasis or hydroureter. The urinary bladder is unremarkable. Stomach/Bowel: Stomach is within normal limits. No evidence of bowel wall thickening or dilatation. Stool throughout the colon. The appendix is not definitely identified with no inflammatory changes in the right lower quadrant to suggest acute appendicitis. Vascular/Lymphatic: No abdominal aorta or iliac aneurysm. At least moderate atherosclerotic plaque of the aorta and its branches. No abdominal, pelvic, or inguinal lymphadenopathy. Reproductive: Prostate is unremarkable. Other: No intraperitoneal free fluid. No intraperitoneal free gas. No organized fluid collection. Musculoskeletal: No abdominal wall hernia or abnormality. No suspicious lytic or blastic osseous lesions. No acute displaced fracture. Multilevel degenerative changes of the spine. IMPRESSION: 1. Possible tiny hiatal hernia. 2. Constipation. 3. Otherwise no acute intra-abdominal or intrapelvic abnormality. 4.  Aortic Atherosclerosis (ICD10-I70.0). Electronically Signed   By: Iven Finn M.D.   On: 12/22/2021 23:56   CT Abdomen Pelvis Wo Contrast  Result Date:  12/09/2021 CLINICAL DATA:  Nausea and vomiting EXAM: CT ABDOMEN AND PELVIS WITHOUT CONTRAST TECHNIQUE: Multidetector CT imaging of the abdomen and pelvis was performed following the standard protocol  without IV contrast. RADIATION DOSE REDUCTION: This exam was performed according to the departmental dose-optimization program which includes automated exposure control, adjustment of the mA and/or kV according to patient size and/or use of iterative reconstruction technique. COMPARISON:  None FINDINGS: Lower chest: No acute abnormality. Hepatobiliary: No focal liver abnormality is seen. No gallstones, gallbladder wall thickening, or biliary dilatation. Pancreas: Unremarkable. No pancreatic ductal dilatation or surrounding inflammatory changes. Spleen: Normal in size without focal abnormality. Adrenals/Urinary Tract: Mild hyperplasia of the adrenal glands is noted. The kidneys are well visualize without renal calculi or urinary tract obstructive changes. The bladder is well distended. Stomach/Bowel: Scattered fecal material is noted throughout the colon. No obstructive changes are seen. The appendix is not well appreciated. No inflammatory changes to suggest appendicitis are noted. Small bowel and stomach are within normal limits. Vascular/Lymphatic: Aortic atherosclerosis. No enlarged abdominal or pelvic lymph nodes. Reproductive: Prostate is unremarkable. Large left hydrocele is noted. Other: No abdominal wall hernia or abnormality. No abdominopelvic ascites. Musculoskeletal: Bony structures show no acute abnormality. Degenerative changes of lumbar spine are noted. IMPRESSION: No acute abnormality to correspond with the given clinical history. Large left hydrocele. Electronically Signed   By: Inez Catalina M.D.   On: 12/09/2021 02:47   CT Head Wo Contrast  Result Date: 12/09/2021 CLINICAL DATA:  Vomiting and altered mental status, initial encounter EXAM: CT HEAD WITHOUT CONTRAST TECHNIQUE: Contiguous axial images  were obtained from the base of the skull through the vertex without intravenous contrast. RADIATION DOSE REDUCTION: This exam was performed according to the departmental dose-optimization program which includes automated exposure control, adjustment of the mA and/or kV according to patient size and/or use of iterative reconstruction technique. COMPARISON:  None. FINDINGS: Brain: No evidence of acute infarction, hemorrhage, hydrocephalus, extra-axial collection or mass lesion/mass effect. Chronic white matter ischemic changes are noted. Vascular: No hyperdense vessel or unexpected calcification. Skull: Normal. Negative for fracture or focal lesion. Sinuses/Orbits: No acute finding. Other: None. IMPRESSION: Chronic white matter ischemic changes without acute abnormality. Electronically Signed   By: Inez Catalina M.D.   On: 12/09/2021 02:27   DG Chest Port 1 View  Result Date: 12/09/2021 CLINICAL DATA:  Altered mental status, weakness EXAM: PORTABLE CHEST 1 VIEW COMPARISON:  01/29/2006 FINDINGS: Lungs are clear.  No pleural effusion or pneumothorax. The heart is normal in size. IMPRESSION: No evidence of acute cardiopulmonary disease. Electronically Signed   By: Julian Hy M.D.   On: 12/09/2021 01:48    Labs: Basic Metabolic Panel: Recent Labs  Lab 12/22/21 1802 12/23/21 1126  NA 139 138  K 3.8 3.6  CL 105 109  CO2 26 22  GLUCOSE 176* 108*  BUN 33* 41*  CREATININE 3.57* 4.58*  CALCIUM 9.2 8.3*   CBC: Recent Labs  Lab 12/22/21 1802 12/23/21 1126  WBC 17.9* 13.5*  HGB 13.0 11.5*  HCT 39.3 34.5*  MCV 96.6 95.8  PLT 255 201   Microbiology: ***  Time coordinating discharge: Over 30 minutes  Richarda Osmond, MD  [facility] 12/24/2021, 9:40 AM

## 2021-12-24 NOTE — Progress Notes (Addendum)
PROGRESS NOTE  Randall [name]    DOB: May 18, 1961, 61 y.o.  IDP:[id number]    Code Status: Full Code   DOA: 12/22/2021   LOS: 0   Brief hospital course  Randall [name] is a 61 y.o. male with a PMH significant for dementia, HTN, HFpEF, CKD 3a, CVA. They presented from home to the ED on 12/22/2021 with intractable vomiting which has been ongoing for weeks. He was recently admitted on 2/3 with similar symptoms and had a mostly unremarkable work up. In the ED, it was found that they had normal vital signs, mild leukocytosis, AKI. CT abdomen showed constipation. Otherwise unremarkable labs.  They were treated with supportive care including IV fluids and antiemetics.  Patient was admitted to medicine service for further workup and management of intractable vomiting as outlined in detail below.  12/24/21 -stable  Assessment & Plan  Principal Problem:   Moderate major depression (HCC) Active Problems:   Dementia (HCC)   Chronic systolic CHF (congestive heart failure) (HCC)   History of CVA (cerebrovascular accident)   Recurrent vomiting   Acute renal failure superimposed on stage 3a chronic kidney disease (HCC)   Leukocytosis  Recurrent vomiting- so far workup has been unrevealing. Differential includes advancing dementia/acute grief, medication side effect, functional dyspepsia, poor dentition. Likely multifactorial. Does not appear to be related with nausea - GI following, appreciate recs  - planning EGD 2/20 - SLP recommendations for diet  AKI on stage 3a chronic kidney disease ([place]) IV hydration Monitor renal function and avoid nephrotoxins - repeat labs pending   History of CVA (cerebrovascular accident) - Continue aspirin and statin   Chronic systolic CHF (congestive heart failure) ([place])- (present on admission) Currently euvolemic Continue Coreg, Carvedilol and Entresto   Dementia   acute grief/depression ([place])- (present on admission) - psych following, appreciate  recs  - added abilify - discontinue Namenda - continue sertraline  Body mass index is 24.96 kg/m.  VTE ppx: enoxaparin (LOVENOX) injection 30 mg Start: 12/23/21 0800  Diet:     Diet   DIET DYS 3 Room service appropriate? Yes with Assist; Fluid consistency: Thin   Subjective 12/24/21    Pt reports no concerns today. He is non verbal but is more responsive to questions with head gestures and following commands, participating in eating.  He vomits within a minute after swallowing his medications   Objective   Vitals:   12/23/21 1606 12/23/21 1929 12/24/21 0355 12/24/21 0724  BP: (!) 166/68 (!) 146/72 (!) 162/87 (!) 174/75  Pulse: (!) 58 64 (!) 59 66  Resp: 16 16 16 18   Temp: 99.2 F (37.3 C) 98.7 F (37.1 C) 99 F (37.2 C) 98.4 F (36.9 C)  TempSrc: Oral Oral Oral Oral  SpO2: 99% 100% 99% 100%  Weight:      Height:        Intake/Output Summary (Last 24 hours) at 12/24/2021 0758 Last data filed at 12/23/2021 1852 Gross per 24 hour  Intake 2242.79 ml  Output --  Net 2242.79 ml   Filed Weights   12/22/21 1802  Weight: 76.7 kg     Physical Exam: General: awake, alert, NAD HEENT: atraumatic, clear conjunctiva, anicteric sclera, MMM, hearing grossly normal Respiratory: normal respiratory effort. Cardiovascular: normal S1/S2, RRR, no JVD, murmurs, quick capillary refill  Gastrointestinal: soft, NT, ND Nervous: alert. Nonverbal, following commands Extremities: moves all equally, no edema, normal tone Skin: dry, intact, normal temperature, normal color. No rashes, lesions or ulcers on exposed skin Psychiatry:  eyes closed. flat  Labs   I have personally reviewed the following labs and imaging studies CBC    Component Value Date/Time   WBC 13.5 (H) 12/23/2021 1126   RBC 3.60 (L) 12/23/2021 1126   HGB 11.5 (L) 12/23/2021 1126   HCT 34.5 (L) 12/23/2021 1126   PLT 201 12/23/2021 1126   MCV 95.8 12/23/2021 1126   MCH 31.9 12/23/2021 1126   MCHC 33.3 12/23/2021  1126   RDW 13.1 12/23/2021 1126   LYMPHSABS 1.1 12/09/2021 0126   MONOABS 0.5 12/09/2021 0126   EOSABS 0.0 12/09/2021 0126   BASOSABS 0.1 12/09/2021 0126   BMP Latest Ref Rng & Units 12/23/2021 12/22/2021 12/10/2021  Glucose 70 - 99 mg/dL 108(H) 176(H) 83  BUN 6 - 20 mg/dL 41(H) 33(H) 19  Creatinine 0.61 - 1.24 mg/dL 4.58(H) 3.57(H) 1.62(H)  Sodium 135 - 145 mmol/L 138 139 135  Potassium 3.5 - 5.1 mmol/L 3.6 3.8 3.3(L)  Chloride 98 - 111 mmol/L 109 105 104  CO2 22 - 32 mmol/L 22 26 25   Calcium 8.9 - 10.3 mg/dL 8.3(L) 9.2 8.7(L)    CT ABDOMEN PELVIS WO CONTRAST  Result Date: 12/22/2021 CLINICAL DATA:  Abdominal pain, acute, nonlocalized EXAM: CT ABDOMEN AND PELVIS WITHOUT CONTRAST TECHNIQUE: Multidetector CT imaging of the abdomen and pelvis was performed following the standard protocol without IV contrast. RADIATION DOSE REDUCTION: This exam was performed according to the departmental dose-optimization program which includes automated exposure control, adjustment of the mA and/or kV according to patient size and/or use of iterative reconstruction technique. COMPARISON:  None. FINDINGS: Lower chest: No acute abnormality.  Possible tiny hiatal hernia. Hepatobiliary: No focal liver abnormality. No gallstones, gallbladder wall thickening, or pericholecystic fluid. No biliary dilatation. Pancreas: No focal lesion. Normal pancreatic contour. No surrounding inflammatory changes. No main pancreatic ductal dilatation. Spleen: Normal in size without focal abnormality. Adrenals/Urinary Tract: No adrenal nodule bilaterally. No nephrolithiasis and no hydronephrosis. No definite contour-deforming renal mass. No ureterolithiasis or hydroureter. The urinary bladder is unremarkable. Stomach/Bowel: Stomach is within normal limits. No evidence of bowel wall thickening or dilatation. Stool throughout the colon. The appendix is not definitely identified with no inflammatory changes in the right lower quadrant to suggest  acute appendicitis. Vascular/Lymphatic: No abdominal aorta or iliac aneurysm. At least moderate atherosclerotic plaque of the aorta and its branches. No abdominal, pelvic, or inguinal lymphadenopathy. Reproductive: Prostate is unremarkable. Other: No intraperitoneal free fluid. No intraperitoneal free gas. No organized fluid collection. Musculoskeletal: No abdominal wall hernia or abnormality. No suspicious lytic or blastic osseous lesions. No acute displaced fracture. Multilevel degenerative changes of the spine. IMPRESSION: 1. Possible tiny hiatal hernia. 2. Constipation. 3. Otherwise no acute intra-abdominal or intrapelvic abnormality. 4.  Aortic Atherosclerosis (ICD10-I70.0). Electronically Signed   By: Iven Finn M.D.   On: 12/22/2021 23:56    Disposition Plan & Communication  Patient status: Observation  Admitted From: Home Planned disposition location: Home health Anticipated discharge date: 2/21 pending GI workup  Family Communication: sister at bedside    Author: Richarda Osmond, DO [facility] 12/24/2021, 7:58 AM   Available by Epic secure chat 7AM-7PM. If 7PM-7AM, please contact night-coverage.  [facility] contact information found on [email or web address].

## 2021-12-24 NOTE — Progress Notes (Signed)
PHARMACIST - PHYSICIAN COMMUNICATION  CONCERNING: IV to Oral Route Change Policy  RECOMMENDATION: This patient is receiving pantoprazole by the intravenous route.  Based on criteria approved by the Pharmacy and Therapeutics Committee, the intravenous medication(s) is/are being converted to the equivalent oral dose form(s).   DESCRIPTION: These criteria include: The patient is eating (either orally or via tube) and/or has been taking other orally administered medications for a least 24 hours The patient has no evidence of active gastrointestinal bleeding or impaired GI absorption (gastrectomy, short bowel, patient on TNA or NPO).  If you have questions about this conversion, please contact the [place] 12/24/2021 9:13 AM

## 2021-12-25 DIAGNOSIS — I5022 Chronic systolic (congestive) heart failure: Secondary | ICD-10-CM | POA: Diagnosis not present

## 2021-12-25 DIAGNOSIS — F321 Major depressive disorder, single episode, moderate: Secondary | ICD-10-CM | POA: Diagnosis not present

## 2021-12-25 DIAGNOSIS — R112 Nausea with vomiting, unspecified: Secondary | ICD-10-CM

## 2021-12-25 DIAGNOSIS — N170 Acute kidney failure with tubular necrosis: Secondary | ICD-10-CM

## 2021-12-25 DIAGNOSIS — Z8673 Personal history of transient ischemic attack (TIA), and cerebral infarction without residual deficits: Secondary | ICD-10-CM | POA: Diagnosis not present

## 2021-12-25 LAB — BASIC METABOLIC PANEL
Anion gap: 10 (ref 5–15)
BUN: 50 mg/dL — ABNORMAL HIGH (ref 6–20)
CO2: 18 mmol/L — ABNORMAL LOW (ref 22–32)
Calcium: 8 mg/dL — ABNORMAL LOW (ref 8.9–10.3)
Chloride: 109 mmol/L (ref 98–111)
Creatinine, Ser: 6.94 mg/dL — ABNORMAL HIGH (ref 0.61–1.24)
GFR, Estimated: 8 mL/min — ABNORMAL LOW (ref >60)
Glucose, Bld: 68 mg/dL — ABNORMAL LOW (ref 70–99)
Potassium: 3.7 mmol/L (ref 3.5–5.1)
Sodium: 137 mmol/L (ref 135–145)

## 2021-12-25 LAB — CBC
HCT: 32.7 % — ABNORMAL LOW (ref 39.0–52.0)
Hemoglobin: 11 g/dL — ABNORMAL LOW (ref 13.0–17.0)
MCH: 31.6 pg (ref 26.0–34.0)
MCHC: 33.6 g/dL (ref 30.0–36.0)
MCV: 94 fL (ref 80.0–100.0)
Platelets: 197 10*3/uL (ref 150–400)
RBC: 3.48 MIL/uL — ABNORMAL LOW (ref 4.22–5.81)
RDW: 12.5 % (ref 11.5–15.5)
WBC: 10.4 10*3/uL (ref 4.0–10.5)
nRBC: 0 % (ref 0.0–0.2)

## 2021-12-25 MED ORDER — HYDRALAZINE HCL 20 MG/ML IJ SOLN
5.0000 mg | Freq: Once | INTRAMUSCULAR | Status: AC
  Administered 2021-12-25: 5 mg via INTRAVENOUS
  Filled 2021-12-25: qty 1

## 2021-12-25 MED ORDER — SACUBITRIL-VALSARTAN 97-103 MG PO TABS
1.0000 | ORAL_TABLET | Freq: Two times a day (BID) | ORAL | Status: DC
  Administered 2021-12-25: 1 via ORAL
  Filled 2021-12-25 (×6): qty 1

## 2021-12-25 NOTE — Progress Notes (Signed)
°   12/25/21 1705  Assess: MEWS Score  BP (!) 209/71  Pulse Rate 61  Level of Consciousness Alert  Assess: MEWS Score  MEWS Temp 0  MEWS Systolic 2  MEWS Pulse 0  MEWS RR 0  MEWS LOC 0  MEWS Score 2  MEWS Score Color Yellow  Treat  Pain Scale Faces  Pain Score 0  Take Vital Signs  Increase Vital Sign Frequency  Yellow: Q 2hr X 2 then Q 4hr X 2, if remains yellow, continue Q 4hrs  Escalate  MEWS: Escalate Yellow: discuss with charge nurse/RN and consider discussing with provider and RRT  Notify: Charge Nurse/RN  Name of Charge Nurse/RN Notified Caitlin RN  Date Charge Nurse/RN Notified 12/25/21  Time Charge Nurse/RN Notified 0992  Notify: Provider  Provider Name/Title Ermalene Searing MD  Date Provider Notified 12/25/21  Time Provider Notified 1705  Notification Type Page (secure chat)  Notification Reason Critical result  Provider response See new orders  Date of Provider Response 12/25/21  Time of Provider Response 1708  Assess: SIRS CRITERIA  SIRS Temperature  0  SIRS Pulse 0  SIRS Respirations  0  SIRS WBC 0  SIRS Score Sum  0     BP 209/71 HR 61 upon recheck following admin of coreg. BP elevated to 209/71 HR remains 61 and patient not currently in distress. MD notified of BP and ordered entresto to be given at 2200.

## 2021-12-25 NOTE — Progress Notes (Signed)
Entresto given at 1830 per MD orders.

## 2021-12-25 NOTE — Progress Notes (Signed)
[facility]  ROUNDING NOTE   Subjective:  Pt is admitted to [facility] on 12/22/2021 with intractable vomiting on 12/22/2021 with intractable vomiting which has been going for weeks.  He noted to have mild leukocytosis and AKI on admission patient has a past medical history significant for dementia, hypertension, HFpEF, CKD 3a and CVA.  We were asked to see the patient for AKI with a significantly elevated BUN and creatinine  Patient was seen and examined noted to be lethargic. He is able to follow some basic commands.  Family at bedside.   Objective:  Vital signs in last 24 hours:  Temp:  [98.1 F (36.7 C)-99 F (37.2 C)] 98.6 F (37 C) (02/19 0740) Pulse Rate:  [56-63] 56 (02/19 0740) Resp:  [16-18] 18 (02/19 0740) BP: (149-161)/(73-76) 149/73 (02/19 0740) SpO2:  [99 %-100 %] 100 % (02/19 0740)  Weight change:  Filed Weights   12/22/21 1802  Weight: 76.7 kg    Intake/Output: I/O last 3 completed shifts: In: 3036.3 [I.V.:3036.3] Out: -    Intake/Output this shift:  Total I/O In: 360 [P.O.:360] Out: -   Physical Exam: General: Awake and lethargic  Head: Normocephalic, atraumatic. Moist oral mucosal membranes  Eyes: Anicteric  Neck: Supple  Lungs:  Clear to auscultation, normal effort  Heart: S1S2 no rubs  Abdomen:  Soft, nontender, bowel sounds present  Extremities:  peripheral edema.  Neurologic: Awake, alert, following  few commands  Skin: No acute rash  Access:     Basic Metabolic Panel: Recent Labs  Lab 12/22/21 1802 12/23/21 1126 12/24/21 1532 12/25/21 0529  NA 139 138 132* 137  K 3.8 3.6 3.7 3.7  CL 105 109 107 109  CO2 26 22 18* 18*  GLUCOSE 176* 108* 97 68*  BUN 33* 41* 47* 50*  CREATININE 3.57* 4.58* 6.16* 6.94*  CALCIUM 9.2 8.3* 7.7* 8.0*    Liver Function Tests: Recent Labs  Lab 12/22/21 1802 12/23/21 1126  AST 18 16  ALT 14 12  ALKPHOS 67 58  BILITOT 0.5 0.5  PROT 7.2 5.8*  ALBUMIN 3.7 2.9*   Recent Labs  Lab  12/22/21 1802  LIPASE 33   No results for input(s): AMMONIA in the last 168 hours.  CBC: Recent Labs  Lab 12/22/21 1802 12/23/21 1126 12/24/21 1532 12/25/21 0529  WBC 17.9* 13.5* 11.0* 10.4  HGB 13.0 11.5* 11.4* 11.0*  HCT 39.3 34.5* 33.7* 32.7*  MCV 96.6 95.8 94.4 94.0  PLT 255 201 199 197    Cardiac Enzymes: No results for input(s): CKTOTAL, CKMB, CKMBINDEX, TROPONINI in the last 168 hours.  BNP: Invalid input(s): POCBNP  CBG: No results for input(s): GLUCAP in the last 168 hours.  Microbiology: Results for orders placed or performed during the hospital encounter of 12/22/21  Resp Panel by RT-PCR (Flu A&B, Covid) Nasopharyngeal Swab     Status: None   Collection Time: 12/22/21 11:18 PM   Specimen: Nasopharyngeal Swab; Nasopharyngeal(NP) swabs in vial transport medium  Result Value Ref Range Status   SARS Coronavirus 2 by RT PCR NEGATIVE NEGATIVE Final    Comment: (NOTE) SARS-CoV-2 target nucleic acids are NOT DETECTED.  The SARS-CoV-2 RNA is generally detectable in upper respiratory specimens during the acute phase of infection. The lowest concentration of SARS-CoV-2 viral copies this assay can detect is 138 copies/mL. A negative result does not preclude SARS-Cov-2 infection and should not be used as the sole basis for treatment or other patient management decisions. A negative result may occur with  improper specimen collection/handling, submission of specimen other than nasopharyngeal swab, presence of viral mutation(s) within the areas targeted by this assay, and inadequate number of viral copies(<138 copies/mL). A negative result must be combined with clinical observations, patient history, and epidemiological information. The expected result is Negative.  Fact Sheet for Patients:  EntrepreneurPulse.com.au  Fact Sheet for Healthcare Providers:  IncredibleEmployment.be  This test is no t yet approved or cleared by the  [place] FDA and  has been authorized for detection and/or diagnosis of SARS-CoV-2 by FDA under an Emergency Use Authorization (EUA). This EUA will remain  in effect (meaning this test can be used) for the duration of the COVID-19 declaration under Section 564(b)(1) of the Act, 21 U.S.C.section 360bbb-3(b)(1), unless the authorization is terminated  or revoked sooner.       Influenza A by PCR NEGATIVE NEGATIVE Final   Influenza B by PCR NEGATIVE NEGATIVE Final    Comment: (NOTE) The Xpert Xpress SARS-CoV-2/FLU/RSV plus assay is intended as an aid in the diagnosis of influenza from Nasopharyngeal swab specimens and should not be used as a sole basis for treatment. Nasal washings and aspirates are unacceptable for Xpert Xpress SARS-CoV-2/FLU/RSV testing.  Fact Sheet for Patients: EntrepreneurPulse.com.au  Fact Sheet for Healthcare Providers: IncredibleEmployment.be  This test is not yet approved or cleared by the [place] FDA and has been authorized for detection and/or diagnosis of SARS-CoV-2 by FDA under an Emergency Use Authorization (EUA). This EUA will remain in effect (meaning this test can be used) for the duration of the COVID-19 declaration under Section 564(b)(1) of the Act, 21 U.S.C. section 360bbb-3(b)(1), unless the authorization is terminated or revoked.  Performed at [facility], [address] [place], [place] [address]     Coagulation Studies: No results for input(s): LABPROT, INR in the last 72 hours.  Urinalysis: Recent Labs    12/22/21 2318  COLORURINE YELLOW*  LABSPEC 1.010  PHURINE 5.0  GLUCOSEU NEGATIVE  HGBUR NEGATIVE  BILIRUBINUR NEGATIVE  KETONESUR NEGATIVE  PROTEINUR 30*  NITRITE NEGATIVE  LEUKOCYTESUR NEGATIVE      Imaging: No results found.   Medications:    sodium chloride 110 mL/hr at 12/25/21 0328    amLODipine  10 mg Oral Daily   ARIPiprazole  1 mg Oral Daily    carvedilol  25 mg Oral BID WC   enoxaparin (LOVENOX) injection  30 mg Subcutaneous Q24H   pantoprazole  40 mg Oral Daily   rosuvastatin  40 mg Oral Daily   sertraline  100 mg Oral Daily   tamsulosin  0.4 mg Oral Daily   acetaminophen **OR** acetaminophen, ondansetron **OR** ondansetron (ZOFRAN) IV, sorbitol, milk of mag, mineral oil, glycerin (SMOG) enema  Assessment/ Plan:  61 y.o. male with a past medical history significant for dementia, hypertension, heart failure, CKD 3a and CVA admitted to [facility] on 12/22/2021 with intractable vomiting.  Also noted to have mild leukocytosis and AKI.   #1 AKI on CKD 3a with baseline creatinine of 1.4 and EGFR of 52.  Patient's latest BUN/creatinine are 50 and 6.94 respectively.  It is felt that acute kidney injury could have been contributed from usage of Advil/NSAIDs and from dehydration secondary to intractable vomiting.  Currently on IV fluid.  Discussed with patient family about need for temporary hemodialysis if patient kidney function does not improve.  Patient's family is contemplating about whether to pursue with the dialysis if he requires it.  Continue to monitor renal function and urine output  #2  recurrent vomiting likely multifactorial including advancing dementia ,medication side effect or functional dyspepsia.  GI is consulted.  Will continue to follow-up.  #3 dementia/depression psych following currently on Abilify. Namenda discontinued. Continue to monitor closely.   LOS: 1 Faaris Arizpe P Donalyn Schneeberger 2/19/20233:22 PM

## 2021-12-25 NOTE — Progress Notes (Signed)
°   12/25/21 1924  Assess: MEWS Score  Temp 98.2 F (36.8 C)  BP (!) 213/81  Pulse Rate 62  Resp 18  Level of Consciousness Alert  SpO2 100 %  O2 Device Room Air  Assess: MEWS Score  MEWS Temp 0  MEWS Systolic 2  MEWS Pulse 0  MEWS RR 0  MEWS LOC 0  MEWS Score 2  MEWS Score Color Yellow  Notify: Provider  Provider Name/Title Neomia Glass  Date Provider Notified 12/25/21  Time Provider Notified 1935  Notification Type Page  Notification Reason Critical result  Provider response See new orders  Date of Provider Response 12/25/21  Time of Provider Response 1936  Document  Progress note created (see row info) Yes  Assess: SIRS CRITERIA  SIRS Temperature  0  SIRS Pulse 0  SIRS Respirations  0  SIRS WBC 0  SIRS Score Sum  0

## 2021-12-25 NOTE — Progress Notes (Signed)
PROGRESS NOTE  Randall [name]    DOB: 1961-05-18, 61 y.o.  ALP:[id number]    Code Status: Full Code   DOA: 12/22/2021   LOS: 1   Brief hospital course  Randall [name] is a 61 y.o. male with a PMH significant for dementia, HTN, HFpEF, CKD 3a, CVA. They presented from home to the ED on 12/22/2021 with intractable vomiting which has been ongoing for weeks. He was recently admitted on 2/3 with similar symptoms and had a mostly unremarkable work up. In the ED, it was found that they had normal vital signs, mild leukocytosis, AKI. CT abdomen showed constipation. Otherwise unremarkable labs.  They were treated with supportive care including IV fluids and antiemetics.  Patient was admitted to medicine service for further workup and management of intractable vomiting as outlined in detail below.  12/25/21 -stable  Assessment & Plan  Principal Problem:   Moderate major depression (HCC) Active Problems:   Dementia (HCC)   Chronic systolic CHF (congestive heart failure) (HCC)   History of CVA (cerebrovascular accident)   Recurrent vomiting   Acute renal failure superimposed on stage 3a chronic kidney disease (HCC)   Leukocytosis   Intractable nausea and vomiting  Recurrent vomiting- so far workup has been unrevealing. Differential includes advancing dementia/acute grief, medication side effect, functional dyspepsia, poor dentition. Likely multifactorial. Does not appear to be related with nausea - GI following, appreciate recs  - planning EGD 2/20 - SLP recommendations for diet  AKI on stage 3a chronic kidney disease ([place])- worsening - continue IV hydration - Monitor renal function and avoid nephrotoxins - nephrology consulted, appreciate recs   History of CVA (cerebrovascular accident) - Continue aspirin and statin   Chronic systolic CHF (congestive heart failure) ([place])- (present on admission) Currently euvolemic Continue Coreg, Carvedilol and Entresto   Dementia   acute  grief/depression ([place])- (present on admission)- seeing subtle but gradual improvements in his interactiveness each day.  - psych following, appreciate recs  - added abilify - discontinue Namenda - continue sertraline  Body mass index is 24.96 kg/m.  VTE ppx: enoxaparin (LOVENOX) injection 30 mg Start: 12/23/21 0800  Diet:     Diet   DIET DYS 3 Room service appropriate? Yes with Assist; Fluid consistency: Thin   Subjective 12/25/21    Pt reports doing "good" today. He was able to open his eyes by request and look at me. He denied any needs. Was participating in feeding himself.    Objective   Vitals:   12/24/21 0355 12/24/21 0724 12/24/21 1928 12/25/21 0454  BP: (!) 162/87 (!) 174/75 (!) 161/75 (!) 157/76  Pulse: (!) 59 66 61 63  Resp: 16 18 16 16   Temp: 99 F (37.2 C) 98.4 F (36.9 C) 99 F (37.2 C) 98.1 F (36.7 C)  TempSrc: Oral Oral Oral   SpO2: 99% 100% 99% 100%  Weight:      Height:        Intake/Output Summary (Last 24 hours) at 12/25/2021 0720 Last data filed at 12/24/2021 1933 Gross per 24 hour  Intake 3036.27 ml  Output --  Net 3036.27 ml    Filed Weights   12/22/21 1802  Weight: 76.7 kg     Physical Exam: General: awake, alert, NAD HEENT: atraumatic, clear conjunctiva, anicteric sclera, MMM, hearing grossly normal Respiratory: normal respiratory effort. Cardiovascular: normal S1/S2, RRR, no JVD, murmurs, quick capillary refill  Gastrointestinal: soft, NT, ND Nervous: alert. Mildly verbal and answered questions appropriately, following commands Extremities: moves all  equally, no edema, normal tone Skin: dry, intact, normal temperature, normal color. No rashes, lesions or ulcers on exposed skin Psychiatry: eyes closed. flat  Labs   I have personally reviewed the following labs and imaging studies CBC    Component Value Date/Time   WBC 10.4 12/25/2021 0529   RBC 3.48 (L) 12/25/2021 0529   HGB 11.0 (L) 12/25/2021 0529   HCT 32.7 (L) 12/25/2021  0529   PLT 197 12/25/2021 0529   MCV 94.0 12/25/2021 0529   MCH 31.6 12/25/2021 0529   MCHC 33.6 12/25/2021 0529   RDW 12.5 12/25/2021 0529   LYMPHSABS 1.1 12/09/2021 0126   MONOABS 0.5 12/09/2021 0126   EOSABS 0.0 12/09/2021 0126   BASOSABS 0.1 12/09/2021 0126   BMP Latest Ref Rng & Units 12/25/2021 12/24/2021 12/23/2021  Glucose 70 - 99 mg/dL 68(L) 97 108(H)  BUN 6 - 20 mg/dL 50(H) 47(H) 41(H)  Creatinine 0.61 - 1.24 mg/dL 6.94(H) 6.16(H) 4.58(H)  Sodium 135 - 145 mmol/L 137 132(L) 138  Potassium 3.5 - 5.1 mmol/L 3.7 3.7 3.6  Chloride 98 - 111 mmol/L 109 107 109  CO2 22 - 32 mmol/L 18(L) 18(L) 22  Calcium 8.9 - 10.3 mg/dL 8.0(L) 7.7(L) 8.3(L)    Disposition Plan & Communication  Patient status: Inpatient  Admitted From: Home Planned disposition location: Home health Anticipated discharge date: 2/21 pending GI workup  Family Communication: none   Author: Richarda Osmond, DO [facility] 12/25/2021, 7:20 AM   Available by Epic secure chat 7AM-7PM. If 7PM-7AM, please contact night-coverage.  [facility] contact information found on [email or web address].

## 2021-12-26 ENCOUNTER — Encounter: Payer: Self-pay | Admitting: Student in an Organized Health Care Education/Training Program

## 2021-12-26 ENCOUNTER — Inpatient Hospital Stay: Payer: 59 | Admitting: Anesthesiology

## 2021-12-26 ENCOUNTER — Encounter
Admission: EM | Disposition: A | Discharge status: Home Health | Payer: Self-pay | Source: Home / Self Care | Attending: Internal Medicine

## 2021-12-26 DIAGNOSIS — F321 Major depressive disorder, single episode, moderate: Secondary | ICD-10-CM | POA: Diagnosis not present

## 2021-12-26 HISTORY — PX: ESOPHAGOGASTRODUODENOSCOPY (EGD) WITH PROPOFOL: SHX5813

## 2021-12-26 LAB — GLUCOSE, CAPILLARY
Glucose-Capillary: 62 mg/dL — ABNORMAL LOW (ref 70–99)
Glucose-Capillary: 97 mg/dL (ref 70–99)
Glucose-Capillary: 108 mg/dL — ABNORMAL HIGH (ref 70–99)

## 2021-12-26 LAB — BASIC METABOLIC PANEL
Anion gap: 9 (ref 5–15)
BUN: 48 mg/dL — ABNORMAL HIGH (ref 6–20)
CO2: 18 mmol/L — ABNORMAL LOW (ref 22–32)
Calcium: 8.1 mg/dL — ABNORMAL LOW (ref 8.9–10.3)
Chloride: 109 mmol/L (ref 98–111)
Creatinine, Ser: 7.78 mg/dL — ABNORMAL HIGH (ref 0.61–1.24)
GFR, Estimated: 7 mL/min — ABNORMAL LOW (ref >60)
Glucose, Bld: 73 mg/dL (ref 70–99)
Potassium: 3.9 mmol/L (ref 3.5–5.1)
Sodium: 136 mmol/L (ref 135–145)

## 2021-12-26 SURGERY — ESOPHAGOGASTRODUODENOSCOPY (EGD) WITH PROPOFOL
Anesthesia: General

## 2021-12-26 MED ORDER — PANTOPRAZOLE SODIUM 40 MG IV SOLR
40.0000 mg | Freq: Once | INTRAVENOUS | Status: AC
  Administered 2021-12-26: 40 mg via INTRAVENOUS
  Filled 2021-12-26: qty 10

## 2021-12-26 MED ORDER — PROPOFOL 10 MG/ML IV BOLUS
INTRAVENOUS | Status: DC | PRN
  Administered 2021-12-26: 40 mg via INTRAVENOUS

## 2021-12-26 MED ORDER — PHENYLEPHRINE 40 MCG/ML (10ML) SYRINGE FOR IV PUSH (FOR BLOOD PRESSURE SUPPORT)
PREFILLED_SYRINGE | INTRAVENOUS | Status: AC
  Filled 2021-12-26: qty 10

## 2021-12-26 MED ORDER — PANTOPRAZOLE SODIUM 40 MG PO TBEC
40.0000 mg | DELAYED_RELEASE_TABLET | Freq: Two times a day (BID) | ORAL | Status: DC
  Administered 2021-12-27 – 2022-01-16 (×40): 40 mg via ORAL
  Filled 2021-12-26 (×43): qty 1

## 2021-12-26 MED ORDER — LIDOCAINE HCL (CARDIAC) PF 100 MG/5ML IV SOSY
PREFILLED_SYRINGE | INTRAVENOUS | Status: DC | PRN
  Administered 2021-12-26: 70 mg via INTRAVENOUS

## 2021-12-26 MED ORDER — PROPOFOL 500 MG/50ML IV EMUL
INTRAVENOUS | Status: DC | PRN
  Administered 2021-12-26: 150 ug/kg/min via INTRAVENOUS

## 2021-12-26 MED ORDER — PHENYLEPHRINE HCL (PRESSORS) 10 MG/ML IV SOLN
INTRAVENOUS | Status: DC | PRN
  Administered 2021-12-26: 160 ug via INTRAVENOUS

## 2021-12-26 MED ORDER — HYDRALAZINE HCL 20 MG/ML IJ SOLN
5.0000 mg | Freq: Once | INTRAMUSCULAR | Status: AC
  Administered 2021-12-26: 5 mg via INTRAVENOUS
  Filled 2021-12-26: qty 1

## 2021-12-26 MED ORDER — DEXTROSE 50 % IV SOLN
INTRAVENOUS | Status: AC
  Administered 2021-12-26: 25 mL via INTRAVENOUS
  Filled 2021-12-26: qty 50

## 2021-12-26 MED ORDER — AMLODIPINE BESYLATE 10 MG PO TABS
10.0000 mg | ORAL_TABLET | Freq: Once | ORAL | Status: AC
  Administered 2021-12-27: 10 mg via ORAL
  Filled 2021-12-26 (×2): qty 1

## 2021-12-26 MED ORDER — EPHEDRINE SULFATE (PRESSORS) 50 MG/ML IJ SOLN
INTRAMUSCULAR | Status: DC | PRN
  Administered 2021-12-26: 10 mg via INTRAVENOUS

## 2021-12-26 MED ORDER — EPHEDRINE 5 MG/ML INJ
INTRAVENOUS | Status: AC
Start: 2021-12-26 — End: ?
  Filled 2021-12-26: qty 5

## 2021-12-26 MED ORDER — DEXTROSE 50 % IV SOLN: 25.0000 mL | Freq: Once | INTRAVENOUS | Status: AC

## 2021-12-26 MED ORDER — SODIUM CHLORIDE 0.9 % IV SOLN: INTRAVENOUS | Status: DC

## 2021-12-26 MED ORDER — SODIUM CHLORIDE 0.9 % IV SOLN
INTRAVENOUS | Status: DC
  Administered 2021-12-26: 1000 mL via INTRAVENOUS

## 2021-12-26 NOTE — Transfer of Care (Signed)
Immediate Anesthesia Transfer of Care Note  Patient: MELITON SAMAD  Procedure(s) Performed: ESOPHAGOGASTRODUODENOSCOPY (EGD) WITH PROPOFOL  Patient Location: Endoscopy Unit  Anesthesia Type:General  Level of Consciousness: drowsy  Airway & Oxygen Therapy: Patient Spontanous Breathing  Post-op Assessment: Report given to RN and Post -op Vital signs reviewed and stable  Post vital signs: Reviewed and stable  Last Vitals:  Vitals Value Taken Time  BP 127/80 12/26/21 1439  Temp    Pulse 80 12/26/21 1440  Resp 18 12/26/21 1440  SpO2 98 % 12/26/21 1440  Vitals shown include unvalidated device data.  Last Pain:  Vitals:   12/26/21 1301  TempSrc: Temporal  PainSc: 0-No pain      Patients Stated Pain Goal: 0 (83/43/73 5789)  Complications: No notable events documented.

## 2021-12-26 NOTE — Progress Notes (Signed)
Occupational Therapy Treatment Patient Details Name: Randall [name] MRN: [id number] DOB: 1961/09/25 Today's Date: 12/26/2021   History of present illness Randall [name] is a 61 y.o. male seen for evaluation of recurrent vomiting at the request of Dr. Judd Gaudier. Patient has a PMH of dementia, hypertension, diabetes, chronic kidney disease, CHF.  Patient presented to the [facility] ED for chief complaint of nausea, vomiting and weakness. Upon presentation to the ED yesterday, vital signs were stable with blood pressure 120/72, heart rate 67, respirations 18, temperature 98.7, pulse oximetry 97%. Labs were significant for new AKI with Cr 3.5, normal electrolytes GFR 19, glucose 176, WBC 17.9, UA unremarkable.  Imaging studies revealed CT of the abdomen pelvis revealed a possible tiny hiatal hernia, constipation, otherwise no acute intra-abdominal or intrapelvic abnormality.  Aortic atherosclerosis.  He is being admitted for IV hydration.  GI is consulted for evaluation and management of recurrent vomiting.   OT comments  Pt seen for OT tx this date. Pt noted with improved cognition, initiation, and more conversational this date when engaged in topics of interest including sports. Pt set up for bath EOB which pt was able to complete without direct assist. Set up for initiating LB dressing and UB dressing in sitting. SBA-CGA for ADL transfers EOB for standing pericare and to complete LB dressing over hips. No AD, no overt LOB noted. SBA for standing grooming tasks at the sink, requiring PRN VC for locating correct items (initially picked up polygrip instead of toothpaste, mouth sponge instead of toothbrush). Pt endorses feeling more himself today and appreciative of opportunity for ADL engagement. Pt progressing towards goals, continues to benefit from skilled OT services to maximize independence, safety, and quality of life.    Recommendations for follow up therapy are one component of a  multi-disciplinary discharge planning process, led by the attending physician.  Recommendations may be updated based on patient status, additional functional criteria and insurance authorization.    Follow Up Recommendations  Home health OT    Assistance Recommended at Discharge Frequent or constant Supervision/Assistance  Patient can return home with the following  A little help with walking and/or transfers;Assistance with cooking/housework;Assist for transportation;Direct supervision/assist for medications management;Direct supervision/assist for financial management;Help with stairs or ramp for entrance;A little help with bathing/dressing/bathroom   Equipment Recommendations  BSC/3in1    Recommendations for Other Services      Precautions / Restrictions Precautions Precautions: Fall Restrictions Weight Bearing Restrictions: No       Mobility Bed Mobility Overal bed mobility: Modified Independent                  Transfers Overall transfer level: Needs assistance Equipment used: None Transfers: Sit to/from Stand Sit to Stand: Supervision, Min guard           General transfer comment: SBA-CGA     Balance Overall balance assessment: Needs assistance Sitting-balance support: No upper extremity supported, Feet supported Sitting balance-Leahy Scale: Good Sitting balance - Comments: good static and dynamic balance during bathing/dressing   Standing balance support: During functional activity, No upper extremity supported Standing balance-Leahy Scale: Fair Standing balance comment: no UE support                           ADL either performed or assessed with clinical judgement   ADL Overall ADL's : Needs assistance/impaired  General ADL Comments: Pt set up for seated bath which pt was able to complete without direct assist. Set up for initiating LB dressing and UB dressing in sitting. SBA-CGA  for ADL transfers EOB for standing pericare and to complete LB dressing over hips. No AD, no overt LOB noted. SBA for standing grooming tasks at the sink, requiring PRN VC for locating correct items (initially picked up polygrip instead of toothpaste, mouth sponge instead of toothbrush).    Extremity/Trunk Assessment              Vision       Perception     Praxis      Cognition Arousal/Alertness: Awake/alert Behavior During Therapy: Flat affect Overall Cognitive Status: Impaired/Different from baseline Area of Impairment: Following commands, Safety/judgement, Problem solving                       Following Commands: Follows one step commands consistently Safety/Judgement: Decreased awareness of safety   Problem Solving: Slow processing, Requires verbal cues General Comments: still soft spoken but more conversational this date when engaged in topics of interest such as sports, follows commands well, minimal VC for safety/sequencing needed during ADL top locate correct items        Exercises      Shoulder Instructions       General Comments      Pertinent Vitals/ Pain       Pain Assessment Pain Assessment: 0-10 Pain Score: 8  Pain Location: L shoulder (chronic) Pain Descriptors / Indicators: Aching Pain Intervention(s): Limited activity within patient's tolerance, Monitored during session, Repositioned, Patient requesting pain meds-RN notified  Home Living                                          Prior Functioning/Environment              Frequency  Min 2X/week        Progress Toward Goals  OT Goals(current goals can now be found in the care plan section)  Progress towards OT goals: Progressing toward goals  Acute Rehab OT Goals Patient Stated Goal: go home OT Goal Formulation: With patient/family Time For Goal Achievement: 01/06/22 Potential to Achieve Goals: Good  Plan Discharge plan remains appropriate;Frequency  remains appropriate    Co-evaluation                 AM-PAC OT "6 Clicks" Daily Activity     Outcome Measure   Help from another person eating meals?: None Help from another person taking care of personal grooming?: A Little Help from another person toileting, which includes using toliet, bedpan, or urinal?: A Little Help from another person bathing (including washing, rinsing, drying)?: A Little Help from another person to put on and taking off regular upper body clothing?: None Help from another person to put on and taking off regular lower body clothing?: A Little 6 Click Score: 20    End of Session    OT Visit Diagnosis: Other abnormalities of gait and mobility (R26.89);Pain Pain - Right/Left: Left Pain - part of body: Shoulder   Activity Tolerance Patient tolerated treatment well   Patient Left in bed;with call bell/phone within reach;with bed alarm set   Nurse Communication Patient requests pain meds        Time: 0936-1010 OT Time Calculation (min): 34 min  Charges: OT  General Charges $OT Visit: 1 Visit OT Treatments $Self Care/Home Management : 23-37 mins  Ardeth Perfect., MPH, MS, OTR/L ascom [phone] 12/26/21, 10:16 AM

## 2021-12-26 NOTE — OR Nursing (Signed)
Blood sugar 62. Reported DR Rosey Bath ordered 50% Dextrose and it was administered.

## 2021-12-26 NOTE — TOC Progression Note (Signed)
Transition of Care [facility]) - Progression Note    Patient Details  Name: Randall [name] MRN: [id number] Date of Birth: 09-01-1961  Transition of Care [facility]) CM/SW Contact  Beverly Sessions, RN Phone Number: 12/26/2021, 11:43 AM  Clinical Narrative:    Gilda Crease and BSC delivered by [facility] yesterday        Expected Discharge Plan and Services                           DME Arranged: 3-N-1, Walker rolling DME Agency: [employer] Date DME Agency Contacted: 12/24/21 Time DME Agency Contacted: [phone] Representative spoke with at DME Agency: [place] Interventions    Readmission Risk Interventions No flowsheet data found.

## 2021-12-26 NOTE — Progress Notes (Addendum)
PT Cancellation Note  Patient Details Name: Carole Deere MRN: [id number] DOB: 20-Jun-1961   Cancelled Treatment:       12/26/21 1353  PT Visit Information  Last PT Received On 12/26/21  Reason Eval/Treat Not Completed Patient at procedure or test/unavailable    Lieutenant Diego PT, DPT 2:11 PM,12/26/21

## 2021-12-26 NOTE — Interval H&P Note (Signed)
History and Physical Interval Note: Progress note from 12/26/21  was reviewed and there was no interval change after seeing and examining the patient.  Written consent was obtained from the patient after discussion of risks, benefits, and alternatives. Patient has consented to proceed with Esophagogastroduodenoscopy with possible intervention   12/26/2021 2:18 PM  Randall [name]  has presented today for surgery, with the diagnosis of nausea and vomiting.  The various methods of treatment have been discussed with the patient and family. After consideration of risks, benefits and other options for treatment, the patient has consented to  Procedure(s): ESOPHAGOGASTRODUODENOSCOPY (EGD) WITH PROPOFOL (N/A) as a surgical intervention.  The patient's history has been reviewed, patient examined, no change in status, stable for surgery.  I have reviewed the patient's chart and labs.  Questions were answered to the patient's satisfaction.     Annamaria Helling

## 2021-12-26 NOTE — Progress Notes (Signed)
Inpatient Follow-up/Progress Note   Patient ID: Randall [name] is a 61 y.o. male.  Overnight Events / Subjective Findings Pt's alertness is improving. Was tolerating po yesterday w/o further nausea/vomiting. Pt's creatinine uptrending. Does not open his eyes when communicating but answers all questions appropriately although slowed response. No other acute gi Complaints.  Review of Systems  Constitutional:  Positive for activity change and appetite change. Negative for chills, diaphoresis, fatigue, fever and unexpected weight change.  HENT:  Negative for trouble swallowing and voice change.   Respiratory:  Negative for shortness of breath and wheezing.   Cardiovascular:  Negative for chest pain, palpitations and leg swelling.  Gastrointestinal:  Positive for nausea and vomiting. Negative for abdominal distention, abdominal pain, anal bleeding, blood in stool, constipation and diarrhea.  Musculoskeletal:  Negative for arthralgias and myalgias.  Skin:  Negative for color change and pallor.  All other systems reviewed and are negative.   Medications  Current Facility-Administered Medications:    0.9 %  sodium chloride infusion, , Intravenous, Continuous, Richarda Osmond, MD, Last Rate: 110 mL/hr at 12/26/21 0626, New Bag at 12/26/21 3570   acetaminophen (TYLENOL) tablet 650 mg, 650 mg, Oral, Q6H PRN, 650 mg at 12/25/21 0329 **OR** acetaminophen (TYLENOL) suppository 650 mg, 650 mg, Rectal, Q6H PRN, Athena Masse, MD   amLODipine (NORVASC) tablet 10 mg, 10 mg, Oral, Daily, Judd Gaudier V, MD, 10 mg at 12/25/21 0845   ARIPiprazole (ABILIFY) tablet 1 mg, 1 mg, Oral, Daily, Clapacs, John T, MD, 1 mg at 12/25/21 0845   carvedilol (COREG) tablet 25 mg, 25 mg, Oral, BID WC, Judd Gaudier V, MD, 25 mg at 12/25/21 1609   ondansetron (ZOFRAN) tablet 4 mg, 4 mg, Oral, Q6H PRN **OR** ondansetron (ZOFRAN) injection 4 mg, 4 mg, Intravenous, Q6H PRN, Athena Masse, MD   pantoprazole  (PROTONIX) EC tablet 40 mg, 40 mg, Oral, Daily, Benita Gutter, RPH, 40 mg at 12/25/21 0845   rosuvastatin (CRESTOR) tablet 40 mg, 40 mg, Oral, Daily, Judd Gaudier V, MD, 40 mg at 12/25/21 0845   sacubitril-valsartan (ENTRESTO) 97-103 mg per tablet, 1 tablet, Oral, BID, Richarda Osmond, MD, 1 tablet at 12/25/21 1803   sertraline (ZOLOFT) tablet 100 mg, 100 mg, Oral, Daily, Clapacs, John T, MD, 100 mg at 12/25/21 0845   sorbitol, milk of mag, mineral oil, glycerin (SMOG) enema, 960 mL, Rectal, Daily PRN, Richarda Osmond, MD   tamsulosin [facility]) capsule 0.4 mg, 0.4 mg, Oral, Daily, Judd Gaudier V, MD, 0.4 mg at 12/25/21 0845  sodium chloride 110 mL/hr at 12/26/21 1779    acetaminophen **OR** acetaminophen, ondansetron **OR** ondansetron (ZOFRAN) IV, sorbitol, milk of mag, mineral oil, glycerin (SMOG) enema   Objective    Vitals:   12/26/21 0103 12/26/21 0423 12/26/21 0438 12/26/21 0537  BP: (!) 165/72 (!) 203/85 (!) 186/84 (!) 182/76  Pulse: 70 64  66  Resp: 18 20    Temp: 98.1 F (36.7 C) 97.9 F (36.6 C)    TempSrc: Oral     SpO2: 100% 100%  100%  Weight:      Height:         Physical Exam Vitals and nursing note reviewed.  Constitutional:      General: He is not in acute distress.    Appearance: He is not ill-appearing, toxic-appearing or diaphoretic.  HENT:     Head: Normocephalic and atraumatic.     Nose: Nose normal.     Mouth/Throat:  Mouth: Mucous membranes are moist.     Pharynx: Oropharynx is clear.  Eyes:     General: No scleral icterus.    Extraocular Movements: Extraocular movements intact.  Cardiovascular:     Rate and Rhythm: Normal rate and regular rhythm.     Heart sounds: Normal heart sounds. No murmur heard.   No friction rub. No gallop.  Pulmonary:     Effort: Pulmonary effort is normal. No respiratory distress.     Breath sounds: Normal breath sounds. No wheezing, rhonchi or rales.  Abdominal:     General: Bowel sounds are normal.  There is no distension.     Palpations: Abdomen is soft.     Tenderness: There is no abdominal tenderness. There is no guarding or rebound.  Musculoskeletal:     Cervical back: Neck supple.     Right lower leg: No edema.     Left lower leg: No edema.  Skin:    General: Skin is warm and dry.     Coloration: Skin is not jaundiced or pale.  Neurological:     Mental Status: He is alert.  Psychiatric:     Comments: Flat, blunted, slowed response     Laboratory Data Recent Labs  Lab 12/23/21 1126 12/24/21 1532 12/25/21 0529  WBC 13.5* 11.0* 10.4  HGB 11.5* 11.4* 11.0*  HCT 34.5* 33.7* 32.7*  PLT 201 199 197   Recent Labs  Lab 12/22/21 1802 12/23/21 1126 12/24/21 1532 12/25/21 0529 12/26/21 0535  NA 139 138 132* 137 136  K 3.8 3.6 3.7 3.7 3.9  CL 105 109 107 109 109  CO2 26 22 18* 18* 18*  BUN 33* 41* 47* 50* 48*  CREATININE 3.57* 4.58* 6.16* 6.94* 7.78*  CALCIUM 9.2 8.3* 7.7* 8.0* 8.1*  PROT 7.2 5.8*  --   --   --   BILITOT 0.5 0.5  --   --   --   ALKPHOS 67 58  --   --   --   ALT 14 12  --   --   --   AST 18 16  --   --   --   GLUCOSE 176* 108* 97 68* 73   No results for input(s): INR in the last 168 hours.    Imaging Studies: No results found.  Assessment:   # persistent nausea vomiting - improved since stopping namenda - constipation being treated - pt tolerating PO  # dementia  # AKI on CKD  # h/o esophageal stricture  # acute encephalopathy  # h/o cva   # CHF  Plan:  Discussed with anesthesia, ok to proceed for EGD today Labs reviewed Npo since midnight Dvt ppx held (last dose yesterday morning) Supportive care including ivf, anti emetics as per primary team Further recommendations pending EGD- see post op report for further instructions Continue bowel regimen to alleviate constipation Consent was obtained via telephone with patient's sister, [name]  Esophagogastroduodenoscopy with possible biopsy, control of bleeding, polypectomy, and  interventions as necessary has been discussed with the patient/patient representative. Informed consent was obtained from the patient/patient representative after explaining the indication, nature, and risks of the procedure including but not limited to death, bleeding, perforation, missed neoplasm/lesions, cardiorespiratory compromise, and reaction to medications. Opportunity for questions was given and appropriate answers were provided. Patient/patient representative has verbalized understanding is amenable to undergoing the procedure.   I personally performed the service.  Management of other medical comorbidities as per primary team  Thank you for  allowing [place] to participate in this patient's care. Please don't hesitate to call if any questions or concerns arise.   Annamaria Helling, DO [facility]  Portions of the record may have been created with voice recognition software. Occasional wrong-word or 'sound-a-like' substitutions may have occurred due to the inherent limitations of voice recognition software.  Read the chart carefully and recognize, using context, where substitutions may have occurred.

## 2021-12-26 NOTE — Progress Notes (Addendum)
[facility]  ROUNDING NOTE   Subjective:  Pt is admitted to [facility] on 12/22/2021 with intractable vomiting on 12/22/2021 with intractable vomiting which has been going for weeks.  He noted to have mild leukocytosis and AKI on admission patient has a past medical history significant for dementia, hypertension, HFpEF, CKD 3a and CVA.  We were asked to see the patient for AKI with a significantly elevated BUN and creatinine  Patient currently sitting up in bed, alert and oriented States he feels much better today Tolerating small meals Denies shortness of breath  Creatinine 7.8 2 urine occurrence reported overnight  Objective:  Vital signs in last 24 hours:  Temp:  [97.7 F (36.5 C)-98.8 F (37.1 C)] 98.2 F (36.8 C) (02/20 1439) Pulse Rate:  [60-77] 69 (02/20 1301) Resp:  [16-20] 16 (02/20 0951) BP: (127-213)/(71-87) 127/80 (02/20 1439) SpO2:  [95 %-100 %] 95 % (02/20 0951)  Weight change:  Filed Weights   12/22/21 1802  Weight: 76.7 kg    Intake/Output: I/O last 3 completed shifts: In: 7113.6 [P.O.:360; I.V.:6753.6] Out: -    Intake/Output this shift:  Total I/O In: 100 [I.V.:100] Out: -   Physical Exam: General: Awake, NAD  Head: Normocephalic, atraumatic. Moist oral mucosal membranes  Eyes: Anicteric  Lungs:  Clear to auscultation, normal effort  Heart: S1S2 no rubs  Abdomen:  Soft, nontender, bowel sounds present  Extremities:  peripheral edema.  Neurologic: Awake, alert, following  few commands  Skin: No acute rash  Access: none    Basic Metabolic Panel: Recent Labs  Lab 12/22/21 1802 12/23/21 1126 12/24/21 1532 12/25/21 0529 12/26/21 0535  NA 139 138 132* 137 136  K 3.8 3.6 3.7 3.7 3.9  CL 105 109 107 109 109  CO2 26 22 18* 18* 18*  GLUCOSE 176* 108* 97 68* 73  BUN 33* 41* 47* 50* 48*  CREATININE 3.57* 4.58* 6.16* 6.94* 7.78*  CALCIUM 9.2 8.3* 7.7* 8.0* 8.1*     Liver Function Tests: Recent Labs  Lab 12/22/21 1802  12/23/21 1126  AST 18 16  ALT 14 12  ALKPHOS 67 58  BILITOT 0.5 0.5  PROT 7.2 5.8*  ALBUMIN 3.7 2.9*    Recent Labs  Lab 12/22/21 1802  LIPASE 33    No results for input(s): AMMONIA in the last 168 hours.  CBC: Recent Labs  Lab 12/22/21 1802 12/23/21 1126 12/24/21 1532 12/25/21 0529  WBC 17.9* 13.5* 11.0* 10.4  HGB 13.0 11.5* 11.4* 11.0*  HCT 39.3 34.5* 33.7* 32.7*  MCV 96.6 95.8 94.4 94.0  PLT 255 201 199 197     Cardiac Enzymes: No results for input(s): CKTOTAL, CKMB, CKMBINDEX, TROPONINI in the last 168 hours.  BNP: Invalid input(s): POCBNP  CBG: Recent Labs  Lab 12/26/21 1314 12/26/21 1402 12/26/21 1442  GLUCAP 62* 97 108*    Microbiology: Results for orders placed or performed during the hospital encounter of 12/22/21  Resp Panel by RT-PCR (Flu A&B, Covid) Nasopharyngeal Swab     Status: None   Collection Time: 12/22/21 11:18 PM   Specimen: Nasopharyngeal Swab; Nasopharyngeal(NP) swabs in vial transport medium  Result Value Ref Range Status   SARS Coronavirus 2 by RT PCR NEGATIVE NEGATIVE Final    Comment: (NOTE) SARS-CoV-2 target nucleic acids are NOT DETECTED.  The SARS-CoV-2 RNA is generally detectable in upper respiratory specimens during the acute phase of infection. The lowest concentration of SARS-CoV-2 viral copies this assay can detect is 138 copies/mL. A negative result  does not preclude SARS-Cov-2 infection and should not be used as the sole basis for treatment or other patient management decisions. A negative result may occur with  improper specimen collection/handling, submission of specimen other than nasopharyngeal swab, presence of viral mutation(s) within the areas targeted by this assay, and inadequate number of viral copies(<138 copies/mL). A negative result must be combined with clinical observations, patient history, and epidemiological information. The expected result is Negative.  Fact Sheet for Patients:   EntrepreneurPulse.com.au  Fact Sheet for Healthcare Providers:  IncredibleEmployment.be  This test is no t yet approved or cleared by the [place] FDA and  has been authorized for detection and/or diagnosis of SARS-CoV-2 by FDA under an Emergency Use Authorization (EUA). This EUA will remain  in effect (meaning this test can be used) for the duration of the COVID-19 declaration under Section 564(b)(1) of the Act, 21 U.S.C.section 360bbb-3(b)(1), unless the authorization is terminated  or revoked sooner.       Influenza A by PCR NEGATIVE NEGATIVE Final   Influenza B by PCR NEGATIVE NEGATIVE Final    Comment: (NOTE) The Xpert Xpress SARS-CoV-2/FLU/RSV plus assay is intended as an aid in the diagnosis of influenza from Nasopharyngeal swab specimens and should not be used as a sole basis for treatment. Nasal washings and aspirates are unacceptable for Xpert Xpress SARS-CoV-2/FLU/RSV testing.  Fact Sheet for Patients: EntrepreneurPulse.com.au  Fact Sheet for Healthcare Providers: IncredibleEmployment.be  This test is not yet approved or cleared by the [place] FDA and has been authorized for detection and/or diagnosis of SARS-CoV-2 by FDA under an Emergency Use Authorization (EUA). This EUA will remain in effect (meaning this test can be used) for the duration of the COVID-19 declaration under Section 564(b)(1) of the Act, 21 U.S.C. section 360bbb-3(b)(1), unless the authorization is terminated or revoked.  Performed at [facility], [address] [place], [place] [address]     Coagulation Studies: No results for input(s): LABPROT, INR in the last 72 hours.  Urinalysis: No results for input(s): COLORURINE, LABSPEC, PHURINE, GLUCOSEU, HGBUR, BILIRUBINUR, KETONESUR, PROTEINUR, UROBILINOGEN, NITRITE, LEUKOCYTESUR in the last 72 hours.  Invalid input(s): APPERANCEUR      Imaging: No results found.   Medications:    sodium chloride 110 mL/hr at 12/26/21 5170   sodium chloride     sodium chloride 20 mL/hr at 12/26/21 1415    [MAR Hold] amLODipine  10 mg Oral Daily   [MAR Hold] ARIPiprazole  1 mg Oral Daily   [MAR Hold] carvedilol  25 mg Oral BID WC   [MAR Hold] pantoprazole  40 mg Oral Daily   [MAR Hold] rosuvastatin  40 mg Oral Daily   [MAR Hold] sacubitril-valsartan  1 tablet Oral BID   [MAR Hold] sertraline  100 mg Oral Daily   [MAR Hold] tamsulosin  0.4 mg Oral Daily   [MAR Hold] acetaminophen **OR** [MAR Hold] acetaminophen, [MAR Hold] ondansetron **OR** [MAR Hold] ondansetron (ZOFRAN) IV, [MAR Hold] sorbitol, milk of mag, mineral oil, glycerin (SMOG) enema  Assessment/ Plan:  61 y.o. male with a past medical history significant for dementia, hypertension, heart failure, CKD 3a and CVA admitted to [facility] on 12/22/2021 with intractable vomiting.  Also noted to have mild leukocytosis and AKI.   #1 AKI on CKD 3a with baseline creatinine of 1.4 and EGFR of 52.  Patient's latest BUN/creatinine are 50 and 6.94 respectively.  It is felt that acute kidney injury could have been contributed from usage of Advil/NSAIDs and from dehydration  secondary to intractable vomiting.   Renal function continues to worsen.  Discussed with patient the possible need to initiate temporary dialysis.  We will monitor a.m. labs and determine dialysis needs.  #2 recurrent vomiting likely multifactorial including advancing dementia ,medication side effect or functional dyspepsia.  GI is consulted.  No complaints of nausea and vomiting today.  Scheduled for EGD later today  #3 dementia/depression psych following currently on Abilify. Namenda discontinued. Continue to monitor closely.  4. Anemia of chronic kidney disease  Normocytic Lab Results  Component Value Date   HGB 11.0 (L) 12/25/2021  Hemoglobin remains within acceptable range   LOS: 2    2/20/20232:51 PM

## 2021-12-26 NOTE — Progress Notes (Signed)
Progress Note:    Randall [name]    MWU:[id number] DOB: 1961/04/25 DOA: 12/22/2021  PCP: Marlowe Aschoff, MD    Brief Narrative:   Randall [name] is a 61 y.o. male with a PMH significant for dementia, HTN, HFpEF, CKD 3a, CVA. They presented from home to the ED on 12/22/2021 with intractable vomiting which has been ongoing for weeks. He was recently admitted on 2/3 with similar symptoms and had a mostly unremarkable work up. In the ED, it was found that they had normal vital signs, mild leukocytosis, AKI. CT abdomen showed constipation. Otherwise unremarkable labs.  They were treated with supportive care including IV fluids and antiemetics.  Patient was admitted to medicine service for further workup and management of intractable vomiting as outlined in detail below.     Subjective:   No nausea or vomiting overnight.  Alert and oriented x4.  Wife present at bedside.  Did use the bathroom 2 times but no output recorded.     Assessment and Plan:   Recurrent vomiting: Planned EGD today.  Unclear etiology.  Wife stated that initially had coffee-ground emesis but no clear.  Speech therapy evaluated and started on dysphagia level 3 diet.  Acute kidney injury on chronic kidney disease stage III: Nephrology following in consultation.  Renal function worsening.  No urine output recorded.  May need to initiate hemodialysis.  Likely secondary to NSAID use and vomiting.  History of CVA: Continue aspirin and statin  CHF: Systolic.  Not in acute exacerbation.  Euvolemic.  Continue Coreg, Coreg and Entresto  Dementia grief/depression: Psych following and added Abilify.  Discontinued Namenda.  Continue home sertraline.   Other information:    DVT prophylaxis: Lovenox subcu Code Status: Full code Family Communication: None Disposition:   Status is: Inpatient Remains inpatient appropriate because: Worsening renal function     Consultants:   Neurology,  gastroenterology    Objective:    Vitals:   12/26/21 0951 12/26/21 1301 12/26/21 1439 12/26/21 1535  BP: (!) 155/87  127/80 (!) 173/85  Pulse: 77 69  65  Resp: 16   16  Temp: 97.8 F (36.6 C) 97.7 F (36.5 C) 98.2 F (36.8 C) 98.1 F (36.7 C)  TempSrc: Oral Temporal Tympanic Oral  SpO2: 95%   100%  Weight:      Height:        Intake/Output Summary (Last 24 hours) at 12/26/2021 1635 Last data filed at 12/26/2021 1430 Gross per 24 hour  Intake 3817.37 ml  Output --  Net 3817.37 ml   Filed Weights   12/22/21 1802  Weight: 76.7 kg       Physical Exam:    General exam: Appears calm and comfortable  Respiratory system: Clear to auscultation. Respiratory effort normal. Cardiovascular system: S1 & S2 heard, RRR. No JVD, murmurs, rubs, gallops or clicks. No pedal edema. Gastrointestinal system: Abdomen is nondistended, soft and nontender. No organomegaly or masses felt. Normal bowel sounds heard. Central nervous system: Alert and oriented. No focal neurological deficits. Extremities: Symmetric 5 x 5 power. Skin: No rashes, lesions or ulcers Psychiatry: Somewhat flat affect    Data Reviewed:    I have personally reviewed following labs and imaging studies  CBC: Recent Labs  Lab 12/22/21 1802 12/23/21 1126 12/24/21 1532 12/25/21 0529  WBC 17.9* 13.5* 11.0* 10.4  HGB 13.0 11.5* 11.4* 11.0*  HCT 39.3 34.5* 33.7* 32.7*  MCV 96.6 95.8 94.4 94.0  PLT 255 201 199 197  Basic Metabolic Panel: Recent Labs  Lab 12/22/21 1802 12/23/21 1126 12/24/21 1532 12/25/21 0529 12/26/21 0535  NA 139 138 132* 137 136  K 3.8 3.6 3.7 3.7 3.9  CL 105 109 107 109 109  CO2 26 22 18* 18* 18*  GLUCOSE 176* 108* 97 68* 73  BUN 33* 41* 47* 50* 48*  CREATININE 3.57* 4.58* 6.16* 6.94* 7.78*  CALCIUM 9.2 8.3* 7.7* 8.0* 8.1*    GFR: Estimated Creatinine Clearance: 10.1 mL/min (A) (by C-G formula based on SCr of 7.78 mg/dL (H)).  Liver Function Tests: Recent Labs  Lab  12/22/21 1802 12/23/21 1126  AST 18 16  ALT 14 12  ALKPHOS 67 58  BILITOT 0.5 0.5  PROT 7.2 5.8*  ALBUMIN 3.7 2.9*    CBG: Recent Labs  Lab 12/26/21 1314 12/26/21 1402 12/26/21 1442  GLUCAP 62* 97 108*     Recent Results (from the past 240 hour(s))  Resp Panel by RT-PCR (Flu A&B, Covid) Nasopharyngeal Swab     Status: None   Collection Time: 12/22/21 11:18 PM   Specimen: Nasopharyngeal Swab; Nasopharyngeal(NP) swabs in vial transport medium  Result Value Ref Range Status   SARS Coronavirus 2 by RT PCR NEGATIVE NEGATIVE Final    Comment: (NOTE) SARS-CoV-2 target nucleic acids are NOT DETECTED.  The SARS-CoV-2 RNA is generally detectable in upper respiratory specimens during the acute phase of infection. The lowest concentration of SARS-CoV-2 viral copies this assay can detect is 138 copies/mL. A negative result does not preclude SARS-Cov-2 infection and should not be used as the sole basis for treatment or other patient management decisions. A negative result may occur with  improper specimen collection/handling, submission of specimen other than nasopharyngeal swab, presence of viral mutation(s) within the areas targeted by this assay, and inadequate number of viral copies(<138 copies/mL). A negative result must be combined with clinical observations, patient history, and epidemiological information. The expected result is Negative.  Fact Sheet for Patients:  EntrepreneurPulse.com.au  Fact Sheet for Healthcare Providers:  IncredibleEmployment.be  This test is no t yet approved or cleared by the [place] FDA and  has been authorized for detection and/or diagnosis of SARS-CoV-2 by FDA under an Emergency Use Authorization (EUA). This EUA will remain  in effect (meaning this test can be used) for the duration of the COVID-19 declaration under Section 564(b)(1) of the Act, 21 U.S.C.section 360bbb-3(b)(1), unless the  authorization is terminated  or revoked sooner.       Influenza A by PCR NEGATIVE NEGATIVE Final   Influenza B by PCR NEGATIVE NEGATIVE Final    Comment: (NOTE) The Xpert Xpress SARS-CoV-2/FLU/RSV plus assay is intended as an aid in the diagnosis of influenza from Nasopharyngeal swab specimens and should not be used as a sole basis for treatment. Nasal washings and aspirates are unacceptable for Xpert Xpress SARS-CoV-2/FLU/RSV testing.  Fact Sheet for Patients: EntrepreneurPulse.com.au  Fact Sheet for Healthcare Providers: IncredibleEmployment.be  This test is not yet approved or cleared by the [place] FDA and has been authorized for detection and/or diagnosis of SARS-CoV-2 by FDA under an Emergency Use Authorization (EUA). This EUA will remain in effect (meaning this test can be used) for the duration of the COVID-19 declaration under Section 564(b)(1) of the Act, 21 U.S.C. section 360bbb-3(b)(1), unless the authorization is terminated or revoked.  Performed at [facility], [address], [place], [place] [address]          Radiology Studies:    No results found.  Medications:    Scheduled Meds:  amLODipine  10 mg Oral Daily   ARIPiprazole  1 mg Oral Daily   carvedilol  25 mg Oral BID WC   pantoprazole  40 mg Oral BID   rosuvastatin  40 mg Oral Daily   sacubitril-valsartan  1 tablet Oral BID   sertraline  100 mg Oral Daily   tamsulosin  0.4 mg Oral Daily   Continuous Infusions:  sodium chloride 110 mL/hr at 12/26/21 0626       LOS: 2 days    Time spent: 5 minutes    Leslee Home, MD [facility]   To contact the attending provider between 7A-7P or the covering provider during after hours 7P-7A, please log into the web site [email or web address] and access using universal [place] password for that web site. If you do not have the password, please call the hospital  operator.  12/26/2021, 4:35 PM

## 2021-12-26 NOTE — H&P (View-Only) (Signed)
Inpatient Follow-up/Progress Note   Patient ID: Randall [name] is a 61 y.o. male.  Overnight Events / Subjective Findings Pt's alertness is improving. Was tolerating po yesterday w/o further nausea/vomiting. Pt's creatinine uptrending. Does not open his eyes when communicating but answers all questions appropriately although slowed response. No other acute gi Complaints.  Review of Systems  Constitutional:  Positive for activity change and appetite change. Negative for chills, diaphoresis, fatigue, fever and unexpected weight change.  HENT:  Negative for trouble swallowing and voice change.   Respiratory:  Negative for shortness of breath and wheezing.   Cardiovascular:  Negative for chest pain, palpitations and leg swelling.  Gastrointestinal:  Positive for nausea and vomiting. Negative for abdominal distention, abdominal pain, anal bleeding, blood in stool, constipation and diarrhea.  Musculoskeletal:  Negative for arthralgias and myalgias.  Skin:  Negative for color change and pallor.  All other systems reviewed and are negative.   Medications  Current Facility-Administered Medications:    0.9 %  sodium chloride infusion, , Intravenous, Continuous, Richarda Osmond, MD, Last Rate: 110 mL/hr at 12/26/21 0626, New Bag at 12/26/21 3546   acetaminophen (TYLENOL) tablet 650 mg, 650 mg, Oral, Q6H PRN, 650 mg at 12/25/21 0329 **OR** acetaminophen (TYLENOL) suppository 650 mg, 650 mg, Rectal, Q6H PRN, Athena Masse, MD   amLODipine (NORVASC) tablet 10 mg, 10 mg, Oral, Daily, Judd Gaudier V, MD, 10 mg at 12/25/21 0845   ARIPiprazole (ABILIFY) tablet 1 mg, 1 mg, Oral, Daily, Clapacs, John T, MD, 1 mg at 12/25/21 0845   carvedilol (COREG) tablet 25 mg, 25 mg, Oral, BID WC, Judd Gaudier V, MD, 25 mg at 12/25/21 1609   ondansetron (ZOFRAN) tablet 4 mg, 4 mg, Oral, Q6H PRN **OR** ondansetron (ZOFRAN) injection 4 mg, 4 mg, Intravenous, Q6H PRN, Athena Masse, MD   pantoprazole  (PROTONIX) EC tablet 40 mg, 40 mg, Oral, Daily, Benita Gutter, RPH, 40 mg at 12/25/21 0845   rosuvastatin (CRESTOR) tablet 40 mg, 40 mg, Oral, Daily, Judd Gaudier V, MD, 40 mg at 12/25/21 0845   sacubitril-valsartan (ENTRESTO) 97-103 mg per tablet, 1 tablet, Oral, BID, Richarda Osmond, MD, 1 tablet at 12/25/21 1803   sertraline (ZOLOFT) tablet 100 mg, 100 mg, Oral, Daily, Clapacs, John T, MD, 100 mg at 12/25/21 0845   sorbitol, milk of mag, mineral oil, glycerin (SMOG) enema, 960 mL, Rectal, Daily PRN, Richarda Osmond, MD   tamsulosin [facility]) capsule 0.4 mg, 0.4 mg, Oral, Daily, Judd Gaudier V, MD, 0.4 mg at 12/25/21 0845  sodium chloride 110 mL/hr at 12/26/21 5681    acetaminophen **OR** acetaminophen, ondansetron **OR** ondansetron (ZOFRAN) IV, sorbitol, milk of mag, mineral oil, glycerin (SMOG) enema   Objective    Vitals:   12/26/21 0103 12/26/21 0423 12/26/21 0438 12/26/21 0537  BP: (!) 165/72 (!) 203/85 (!) 186/84 (!) 182/76  Pulse: 70 64  66  Resp: 18 20    Temp: 98.1 F (36.7 C) 97.9 F (36.6 C)    TempSrc: Oral     SpO2: 100% 100%  100%  Weight:      Height:         Physical Exam Vitals and nursing note reviewed.  Constitutional:      General: He is not in acute distress.    Appearance: He is not ill-appearing, toxic-appearing or diaphoretic.  HENT:     Head: Normocephalic and atraumatic.     Nose: Nose normal.     Mouth/Throat:  Mouth: Mucous membranes are moist.     Pharynx: Oropharynx is clear.  Eyes:     General: No scleral icterus.    Extraocular Movements: Extraocular movements intact.  Cardiovascular:     Rate and Rhythm: Normal rate and regular rhythm.     Heart sounds: Normal heart sounds. No murmur heard.   No friction rub. No gallop.  Pulmonary:     Effort: Pulmonary effort is normal. No respiratory distress.     Breath sounds: Normal breath sounds. No wheezing, rhonchi or rales.  Abdominal:     General: Bowel sounds are normal.  There is no distension.     Palpations: Abdomen is soft.     Tenderness: There is no abdominal tenderness. There is no guarding or rebound.  Musculoskeletal:     Cervical back: Neck supple.     Right lower leg: No edema.     Left lower leg: No edema.  Skin:    General: Skin is warm and dry.     Coloration: Skin is not jaundiced or pale.  Neurological:     Mental Status: He is alert.  Psychiatric:     Comments: Flat, blunted, slowed response     Laboratory Data Recent Labs  Lab 12/23/21 1126 12/24/21 1532 12/25/21 0529  WBC 13.5* 11.0* 10.4  HGB 11.5* 11.4* 11.0*  HCT 34.5* 33.7* 32.7*  PLT 201 199 197   Recent Labs  Lab 12/22/21 1802 12/23/21 1126 12/24/21 1532 12/25/21 0529 12/26/21 0535  NA 139 138 132* 137 136  K 3.8 3.6 3.7 3.7 3.9  CL 105 109 107 109 109  CO2 26 22 18* 18* 18*  BUN 33* 41* 47* 50* 48*  CREATININE 3.57* 4.58* 6.16* 6.94* 7.78*  CALCIUM 9.2 8.3* 7.7* 8.0* 8.1*  PROT 7.2 5.8*  --   --   --   BILITOT 0.5 0.5  --   --   --   ALKPHOS 67 58  --   --   --   ALT 14 12  --   --   --   AST 18 16  --   --   --   GLUCOSE 176* 108* 97 68* 73   No results for input(s): INR in the last 168 hours.    Imaging Studies: No results found.  Assessment:   # persistent nausea vomiting - improved since stopping namenda - constipation being treated - pt tolerating PO  # dementia  # AKI on CKD  # h/o esophageal stricture  # acute encephalopathy  # h/o cva   # CHF  Plan:  Discussed with anesthesia, ok to proceed for EGD today Labs reviewed Npo since midnight Dvt ppx held (last dose yesterday morning) Supportive care including ivf, anti emetics as per primary team Further recommendations pending EGD- see post op report for further instructions Continue bowel regimen to alleviate constipation Consent was obtained via telephone with patient's sister, [name]  Esophagogastroduodenoscopy with possible biopsy, control of bleeding, polypectomy, and  interventions as necessary has been discussed with the patient/patient representative. Informed consent was obtained from the patient/patient representative after explaining the indication, nature, and risks of the procedure including but not limited to death, bleeding, perforation, missed neoplasm/lesions, cardiorespiratory compromise, and reaction to medications. Opportunity for questions was given and appropriate answers were provided. Patient/patient representative has verbalized understanding is amenable to undergoing the procedure.   I personally performed the service.  Management of other medical comorbidities as per primary team  Thank you for  allowing [place] to participate in this patient's care. Please don't hesitate to call if any questions or concerns arise.   Annamaria Helling, DO [facility]  Portions of the record may have been created with voice recognition software. Occasional wrong-word or 'sound-a-like' substitutions may have occurred due to the inherent limitations of voice recognition software.  Read the chart carefully and recognize, using context, where substitutions may have occurred.

## 2021-12-26 NOTE — Anesthesia Preprocedure Evaluation (Signed)
Anesthesia Evaluation  Patient identified by MRN, date of birth, ID band Patient awake    Reviewed: Allergy & Precautions, H&P , NPO status , Patient's Chart, lab work & pertinent test results, reviewed documented beta blocker date and time   History of Anesthesia Complications Negative for: history of anesthetic complications  Airway Mallampati: II  TM Distance: >3 FB Neck ROM: full    Dental  (+) Dental Advidsory Given, Poor Dentition   Pulmonary neg shortness of breath, COPD, neg recent URI, Current Smoker,    Pulmonary exam normal breath sounds clear to auscultation       Cardiovascular Exercise Tolerance: Good hypertension, (-) angina+CHF  (-) Past MI and (-) Cardiac Stents Normal cardiovascular exam(-) dysrhythmias (-) Valvular Problems/Murmurs Rhythm:regular Rate:Normal     Neuro/Psych PSYCHIATRIC DISORDERS Depression Dementia negative neurological ROS     GI/Hepatic negative GI ROS, Neg liver ROS,   Endo/Other  negative endocrine ROS  Renal/GU ARF and CRFRenal disease  negative genitourinary   Musculoskeletal   Abdominal   Peds  Hematology negative hematology ROS (+)   Anesthesia Other Findings Past Medical History: No date: Dementia (HCC) No date: Diabetes mellitus without complication (HCC) No date: Hypertension   Reproductive/Obstetrics negative OB ROS                             Anesthesia Physical Anesthesia Plan  ASA: 2  Anesthesia Plan: General   Post-op Pain Management:    Induction: Intravenous  PONV Risk Score and Plan: 1 and Propofol infusion and TIVA  Airway Management Planned: Natural Airway and Nasal Cannula  Additional Equipment:   Intra-op Plan:   Post-operative Plan:   Informed Consent: I have reviewed the patients History and Physical, chart, labs and discussed the procedure including the risks, benefits and alternatives for the proposed  anesthesia with the patient or authorized representative who has indicated his/her understanding and acceptance.     Dental Advisory Given  Plan Discussed with: Anesthesiologist, CRNA and Surgeon  Anesthesia Plan Comments:         Anesthesia Quick Evaluation

## 2021-12-26 NOTE — Op Note (Signed)
[facility] Gastroenterology Patient Name: Randall [name] Procedure Date: 12/26/2021 1:58 PM MRN: [id number] Account #: [id number] Date of Birth: 12-13-1960 Admit Type: Inpatient Age: 61 Room: [facility] ENDO ROOM 2 Gender: Male Note Status: Finalized Instrument Name: Upper Endoscope [id number] Procedure:             Upper GI endoscopy Indications:           Nausea with vomiting Providers:             Annamaria Helling DO, DO Referring MD:          No Local Md, MD (Referring MD) Medicines:             Monitored Anesthesia Care Complications:         No immediate complications. Estimated blood loss:                         Minimal. Procedure:             Pre-Anesthesia Assessment:                        - Prior to the procedure, a History and Physical was                         performed, and patient medications and allergies were                         reviewed. The patient is competent. The risks and                         benefits of the procedure and the sedation options and                         risks were discussed with the patient. All questions                         were answered and informed consent was obtained.                         Patient identification and proposed procedure were                         verified by the physician, the nurse, the anesthetist                         and the technician in the endoscopy suite. Mental                         Status Examination: alert and oriented. Airway                         Examination: normal oropharyngeal airway and neck                         mobility. Respiratory Examination: clear to                         auscultation. CV Examination: RRR, no murmurs, no S3  or S4. Prophylactic Antibiotics: The patient does not                         require prophylactic antibiotics. Prior                         Anticoagulants: The patient has taken no previous                          anticoagulant or antiplatelet agents. ASA Grade                         Assessment: III - A patient with severe systemic                         disease. After reviewing the risks and benefits, the                         patient was deemed in satisfactory condition to                         undergo the procedure. The anesthesia plan was to use                         monitored anesthesia care (MAC). Immediately prior to                         administration of medications, the patient was                         re-assessed for adequacy to receive sedatives. The                         heart rate, respiratory rate, oxygen saturations,                         blood pressure, adequacy of pulmonary ventilation, and                         response to care were monitored throughout the                         procedure. The physical status of the patient was                         re-assessed after the procedure.                        After obtaining informed consent, the endoscope was                         passed under direct vision. Throughout the procedure,                         the patient's blood pressure, pulse, and oxygen                         saturations were monitored continuously. The Endoscope  was introduced through the mouth, and advanced to the                         second part of duodenum. The upper GI endoscopy was                         accomplished without difficulty. The patient tolerated                         the procedure well. Findings:      A few localized erosions without bleeding were found in the duodenal       bulb. Biopsies were taken with a cold forceps for histology. Estimated       blood loss was minimal. Duodenitis noted in bulb.      Diffuse moderate inflammation characterized by erosions and erythema was       found in the gastric antrum, at the pylorus and in the entire examined       stomach. Biopsies were taken with  a cold forceps for histology. Biopsies       were taken with a cold forceps for Helicobacter pylori testing.       Estimated blood loss was minimal.      A small hiatal hernia was present. Estimated blood loss: none.      LA Grade B (one or more mucosal breaks greater than 5 mm, not extending       between the tops of two mucosal folds) esophagitis with no bleeding was       found. Estimated blood loss: none.      Esophagogastric landmarks were identified: the gastroesophageal junction       was found at 40 cm from the incisors.      Abnormal motility was noted in the esophagus. The cricopharyngeus was       normal. There is spasticity of the esophageal body. The distal       esophagus/lower esophageal sphincter is open. Tertiary peristaltic waves       are noted. Estimated blood loss: none.      The exam of the esophagus was otherwise normal. Impression:            - Duodenal erosions without bleeding. Biopsied.                        - Gastritis. Biopsied.                        - Small hiatal hernia.                        - LA Grade B reflux esophagitis with no bleeding.                        - Esophagogastric landmarks identified.                        - Abnormal esophageal motility, suspicious for                         presbyesophagus. Recommendation:        - Return patient to hospital ward for ongoing care.                        -  Soft diet.                        - Continue present medications.                        - Use Protonix (pantoprazole) 40 mg PO BID for 12                         weeks.                        - Await pathology results.                        - Return to GI clinic at appointment to be scheduled.                        - The findings and recommendations were discussed with                         the patient's primary physician.                        - The findings and recommendations were discussed with                         the patient's  family.                        - Anti emetics and supportive care as per primary team.                        GI to sign off. Available as needed                        - Patient's esophagus not dilated due to present                         esophagitis. Can consider if still symptomatic after                         8-12 weeks of ppi therapy Procedure Code(s):     --- Professional ---                        [phone], Esophagogastroduodenoscopy, flexible,                         transoral; with biopsy, single or multiple Diagnosis Code(s):     --- Professional ---                        K26.9, Duodenal ulcer, unspecified as acute or                         chronic, without hemorrhage or perforation                        K29.70, Gastritis, unspecified, without bleeding  K44.9, Diaphragmatic hernia without obstruction or                         gangrene                        K21.00, Gastro-esophageal reflux disease with                         esophagitis, without bleeding                        K22.4, Dyskinesia of esophagus                        R11.2, Nausea with vomiting, unspecified CPT copyright 2019 American Medical Association. All rights reserved. The codes documented in this report are preliminary and upon coder review may  be revised to meet current compliance requirements. Attending Participation:      I personally performed the entire procedure. Volney American, DO Annamaria Helling DO, DO 12/26/2021 2:42:22 PM This report has been signed electronically. Number of Addenda: 0 Note Initiated On: 12/26/2021 1:58 PM Estimated Blood Loss:  Estimated blood loss was minimal.      [facility]

## 2021-12-27 ENCOUNTER — Other Ambulatory Visit (INDEPENDENT_AMBULATORY_CARE_PROVIDER_SITE_OTHER): Payer: Self-pay | Admitting: Nurse Practitioner

## 2021-12-27 ENCOUNTER — Encounter: Payer: Self-pay | Admitting: Vascular Surgery

## 2021-12-27 ENCOUNTER — Inpatient Hospital Stay
Admission: EM | Disposition: A | Discharge status: Home Health | Payer: Self-pay | Source: Home / Self Care | Attending: Internal Medicine

## 2021-12-27 DIAGNOSIS — Z992 Dependence on renal dialysis: Secondary | ICD-10-CM

## 2021-12-27 DIAGNOSIS — N186 End stage renal disease: Secondary | ICD-10-CM

## 2021-12-27 DIAGNOSIS — F321 Major depressive disorder, single episode, moderate: Secondary | ICD-10-CM | POA: Diagnosis not present

## 2021-12-27 DIAGNOSIS — R1110 Vomiting, unspecified: Secondary | ICD-10-CM | POA: Diagnosis not present

## 2021-12-27 HISTORY — PX: TEMPORARY DIALYSIS CATHETER: CATH118312

## 2021-12-27 LAB — RENAL FUNCTION PANEL
Albumin: 2.7 g/dL — ABNORMAL LOW (ref 3.5–5.0)
Anion gap: 11 (ref 5–15)
BUN: 57 mg/dL — ABNORMAL HIGH (ref 6–20)
CO2: 15 mmol/L — ABNORMAL LOW (ref 22–32)
Calcium: 8 mg/dL — ABNORMAL LOW (ref 8.9–10.3)
Chloride: 113 mmol/L — ABNORMAL HIGH (ref 98–111)
Creatinine, Ser: 8.48 mg/dL — ABNORMAL HIGH (ref 0.61–1.24)
GFR, Estimated: 7 mL/min — ABNORMAL LOW (ref >60)
Glucose, Bld: 88 mg/dL (ref 70–99)
Phosphorus: 4.7 mg/dL — ABNORMAL HIGH (ref 2.5–4.6)
Potassium: 3.9 mmol/L (ref 3.5–5.1)
Sodium: 139 mmol/L (ref 135–145)

## 2021-12-27 LAB — CBC
HCT: 37.3 % — ABNORMAL LOW (ref 39.0–52.0)
Hemoglobin: 12.7 g/dL — ABNORMAL LOW (ref 13.0–17.0)
MCH: 31.7 pg (ref 26.0–34.0)
MCHC: 34 g/dL (ref 30.0–36.0)
MCV: 93 fL (ref 80.0–100.0)
Platelets: 209 10*3/uL (ref 150–400)
RBC: 4.01 MIL/uL — ABNORMAL LOW (ref 4.22–5.81)
RDW: 12.8 % (ref 11.5–15.5)
WBC: 9.9 10*3/uL (ref 4.0–10.5)
nRBC: 0 % (ref 0.0–0.2)

## 2021-12-27 SURGERY — TEMPORARY DIALYSIS CATHETER
Anesthesia: LOCAL

## 2021-12-27 MED ORDER — ALTEPLASE 2 MG IJ SOLR: 2.0000 mg | Freq: Once | INTRAMUSCULAR | Status: DC | PRN

## 2021-12-27 MED ORDER — HEPARIN SODIUM (PORCINE) 1000 UNIT/ML IJ SOLN
INTRAMUSCULAR | Status: AC
  Filled 2021-12-27: qty 10

## 2021-12-27 MED ORDER — PENTAFLUOROPROP-TETRAFLUOROETH EX AERO
1.0000 | INHALATION_SPRAY | CUTANEOUS | Status: DC | PRN
Start: 2021-12-27 — End: 2021-12-27
  Filled 2021-12-27: qty 30

## 2021-12-27 MED ORDER — HYDRALAZINE HCL 20 MG/ML IJ SOLN
5.0000 mg | Freq: Four times a day (QID) | INTRAMUSCULAR | Status: DC | PRN
  Administered 2021-12-27 – 2021-12-30 (×5): 5 mg via INTRAVENOUS
  Filled 2021-12-27: qty 0.25
  Filled 2021-12-27 (×4): qty 1

## 2021-12-27 MED ORDER — HEPARIN SODIUM (PORCINE) 1000 UNIT/ML DIALYSIS: 1000.0000 [IU] | INTRAMUSCULAR | Status: DC | PRN

## 2021-12-27 MED ORDER — MIDAZOLAM HCL 2 MG/ML PO SYRP: 8.0000 mg | ORAL_SOLUTION | Freq: Once | ORAL | Status: DC | PRN

## 2021-12-27 MED ORDER — FAMOTIDINE 20 MG PO TABS
40.0000 mg | ORAL_TABLET | Freq: Once | ORAL | Status: DC | PRN
Start: 2021-12-27 — End: 2021-12-27

## 2021-12-27 MED ORDER — HYDRALAZINE HCL 20 MG/ML IJ SOLN
10.0000 mg | Freq: Once | INTRAMUSCULAR | Status: AC
  Administered 2021-12-27: 10 mg via INTRAVENOUS
  Filled 2021-12-27: qty 1

## 2021-12-27 MED ORDER — LIDOCAINE HCL (PF) 1 % IJ SOLN
5.0000 mL | INTRAMUSCULAR | Status: DC | PRN
Start: 2021-12-27 — End: 2021-12-27

## 2021-12-27 MED ORDER — SODIUM CHLORIDE 0.9 % IV SOLN: 100.0000 mL | INTRAVENOUS | Status: DC | PRN

## 2021-12-27 MED ORDER — LIDOCAINE-PRILOCAINE 2.5-2.5 % EX CREA: 1.0000 "application " | TOPICAL_CREAM | CUTANEOUS | Status: DC | PRN

## 2021-12-27 MED ORDER — ROSUVASTATIN CALCIUM 10 MG PO TABS
10.0000 mg | ORAL_TABLET | Freq: Every day | ORAL | Status: DC
  Administered 2021-12-29 – 2022-01-17 (×20): 10 mg via ORAL
  Filled 2021-12-27 (×20): qty 1

## 2021-12-27 MED ORDER — CLINDAMYCIN PHOSPHATE 300 MG/50ML IV SOLN: 300.0000 mg | INTRAVENOUS | Status: DC

## 2021-12-27 MED ORDER — METHYLPREDNISOLONE SODIUM SUCC 125 MG IJ SOLR: 125.0000 mg | Freq: Once | INTRAMUSCULAR | Status: DC | PRN

## 2021-12-27 MED ORDER — SODIUM BICARBONATE 650 MG PO TABS
650.0000 mg | ORAL_TABLET | Freq: Three times a day (TID) | ORAL | Status: DC
  Administered 2021-12-27 – 2022-01-16 (×58): 650 mg via ORAL
  Filled 2021-12-27 (×58): qty 1

## 2021-12-27 MED ORDER — DIPHENHYDRAMINE HCL 50 MG/ML IJ SOLN: 50.0000 mg | Freq: Once | INTRAMUSCULAR | Status: DC | PRN

## 2021-12-27 MED ORDER — CHLORHEXIDINE GLUCONATE CLOTH 2 % EX PADS
6.0000 | MEDICATED_PAD | Freq: Every day | CUTANEOUS | Status: DC
  Administered 2021-12-28 – 2022-01-17 (×22): 6 via TOPICAL

## 2021-12-27 MED ORDER — SODIUM CHLORIDE 0.9 % IV SOLN: INTRAVENOUS | Status: DC

## 2021-12-27 SURGICAL SUPPLY — 2 items
COVER PROBE U/S 5X48 (MISCELLANEOUS) ×1 IMPLANT
KIT DIALYSIS CATH TRI 30X13 (CATHETERS) ×1 IMPLANT

## 2021-12-27 NOTE — Hospital Course (Addendum)
Taken from prior notes.  Randall [name] is a 61 y.o. male with a PMH significant for dementia, HTN, HFpEF, CKD 3a, CVA. They presented from home to the ED on 12/22/2021 with intractable vomiting which has been ongoing for weeks. He was recently admitted on 2/3 with similar symptoms and had a mostly unremarkable work up. In the ED, it was found that they had normal vital signs, mild leukocytosis, AKI. CT abdomen showed constipation. Otherwise unremarkable labs.  They were treated with supportive care including IV fluids and antiemetics.   Due to persistent nausea and vomiting, GI was consulted and patient underwent EGD yesterday on 12/26/2021 which shows esophagitis, gastritis and few nonbleeding erosions involving first part of duodenum.  Biopsies were taken.  Due to worsening renal function nephrology decided to start dialysis, HD catheter was placed by vascular surgery and patient will receive his first session today.  2/22.  Patient receives second session of dialysis today.  Most likely will need permanent HD catheter and outpatient dialysis chair before discharge. Remained oliguric.  2/23: Patient was seen after the third session of dialysis today.  He appears little more confused than his baseline.  He was slow and able to just tell me his name.  Permanent catheter will be placed tomorrow by vascular surgery.  Nephrology started the procedure for outpatient dialysis chair.  2/24: Permanent catheter was placed by vascular surgery earlier today.  Tolerated the procedure well.  Plan is for another session of dialysis later today.  Waiting for outpatient dialysis chair. PT is recommending home health.  2/25: Awaiting outpatient dialysis chair.  2/26: Hypokalemia resolved.  Started on hydralazine for persistently elevated blood pressure. We will get dialysis tomorrow, hopefully to get some news about outpatient dialysis chair tomorrow.  2/27: Started making more urine, about 1400  recorded.  Nephrology decided to hold dialysis today, will repeat renal function tomorrow.  Still waiting for outpatient HD chair.  2/28: Continue to have good urinary output.  Some worsening of creatinine today, nephrology is recommending holding Entresto and observing for next couple of days for some spontaneous renal function recovery.

## 2021-12-27 NOTE — Progress Notes (Signed)
[facility]  ROUNDING NOTE   Subjective:  Pt is admitted to [facility] on 12/22/2021 with intractable vomiting on 12/22/2021 with intractable vomiting which has been going for weeks.  He noted to have mild leukocytosis and AKI on admission patient has a past medical history significant for dementia, hypertension, HFpEF, CKD 3a and CVA.  We were asked to see the patient for AKI with a significantly elevated BUN and creatinine  Patient seen resting quietly, sister at bedside Alert and oriented After gaining permission to speak freely with sister at bedside Renal function continues to worsen with little urine output recorded overnight  Objective:  Vital signs in last 24 hours:  Temp:  [97.7 F (36.5 C)-98.9 F (37.2 C)] 98.6 F (37 C) (02/21 0823) Pulse Rate:  [65-84] 81 (02/21 0823) Resp:  [16-18] 16 (02/21 0823) BP: (127-196)/(72-86) 160/86 (02/21 0823) SpO2:  [99 %-100 %] 100 % (02/21 0823)  Weight change:  Filed Weights   12/22/21 1802  Weight: 76.7 kg    Intake/Output: I/O last 3 completed shifts: In: 4335.7 [I.V.:4335.7] Out: 300 [Urine:300]   Intake/Output this shift:  No intake/output data recorded.  Physical Exam: General: Awake, NAD  Head: Normocephalic, atraumatic. Moist oral mucosal membranes  Eyes: Anicteric  Lungs:  Clear to auscultation, normal effort  Heart: S1S2 no rubs  Abdomen:  Soft, nontender, bowel sounds present  Extremities:  Trace peripheral edema.  Neurologic: Awake, alert, following  few commands  Skin: No acute rash  Access: none    Basic Metabolic Panel: Recent Labs  Lab 12/23/21 1126 12/24/21 1532 12/25/21 0529 12/26/21 0535 12/27/21 0409  NA 138 132* 137 136 139  K 3.6 3.7 3.7 3.9 3.9  CL 109 107 109 109 113*  CO2 22 18* 18* 18* 15*  GLUCOSE 108* 97 68* 73 88  BUN 41* 47* 50* 48* 57*  CREATININE 4.58* 6.16* 6.94* 7.78* 8.48*  CALCIUM 8.3* 7.7* 8.0* 8.1* 8.0*  PHOS  --   --   --   --  4.7*     Liver Function  Tests: Recent Labs  Lab 12/22/21 1802 12/23/21 1126 12/27/21 0409  AST 18 16  --   ALT 14 12  --   ALKPHOS 67 58  --   BILITOT 0.5 0.5  --   PROT 7.2 5.8*  --   ALBUMIN 3.7 2.9* 2.7*    Recent Labs  Lab 12/22/21 1802  LIPASE 33    No results for input(s): AMMONIA in the last 168 hours.  CBC: Recent Labs  Lab 12/22/21 1802 12/23/21 1126 12/24/21 1532 12/25/21 0529 12/27/21 0409  WBC 17.9* 13.5* 11.0* 10.4 9.9  HGB 13.0 11.5* 11.4* 11.0* 12.7*  HCT 39.3 34.5* 33.7* 32.7* 37.3*  MCV 96.6 95.8 94.4 94.0 93.0  PLT 255 201 199 197 209     Cardiac Enzymes: No results for input(s): CKTOTAL, CKMB, CKMBINDEX, TROPONINI in the last 168 hours.  BNP: Invalid input(s): POCBNP  CBG: Recent Labs  Lab 12/26/21 1314 12/26/21 1402 12/26/21 1442  GLUCAP 62* 97 108*     Microbiology: Results for orders placed or performed during the hospital encounter of 12/22/21  Resp Panel by RT-PCR (Flu A&B, Covid) Nasopharyngeal Swab     Status: None   Collection Time: 12/22/21 11:18 PM   Specimen: Nasopharyngeal Swab; Nasopharyngeal(NP) swabs in vial transport medium  Result Value Ref Range Status   SARS Coronavirus 2 by RT PCR NEGATIVE NEGATIVE Final    Comment: (NOTE) SARS-CoV-2 target nucleic acids  are NOT DETECTED.  The SARS-CoV-2 RNA is generally detectable in upper respiratory specimens during the acute phase of infection. The lowest concentration of SARS-CoV-2 viral copies this assay can detect is 138 copies/mL. A negative result does not preclude SARS-Cov-2 infection and should not be used as the sole basis for treatment or other patient management decisions. A negative result may occur with  improper specimen collection/handling, submission of specimen other than nasopharyngeal swab, presence of viral mutation(s) within the areas targeted by this assay, and inadequate number of viral copies(<138 copies/mL). A negative result must be combined with clinical  observations, patient history, and epidemiological information. The expected result is Negative.  Fact Sheet for Patients:  EntrepreneurPulse.com.au  Fact Sheet for Healthcare Providers:  IncredibleEmployment.be  This test is no t yet approved or cleared by the [place] FDA and  has been authorized for detection and/or diagnosis of SARS-CoV-2 by FDA under an Emergency Use Authorization (EUA). This EUA will remain  in effect (meaning this test can be used) for the duration of the COVID-19 declaration under Section 564(b)(1) of the Act, 21 U.S.C.section 360bbb-3(b)(1), unless the authorization is terminated  or revoked sooner.       Influenza A by PCR NEGATIVE NEGATIVE Final   Influenza B by PCR NEGATIVE NEGATIVE Final    Comment: (NOTE) The Xpert Xpress SARS-CoV-2/FLU/RSV plus assay is intended as an aid in the diagnosis of influenza from Nasopharyngeal swab specimens and should not be used as a sole basis for treatment. Nasal washings and aspirates are unacceptable for Xpert Xpress SARS-CoV-2/FLU/RSV testing.  Fact Sheet for Patients: EntrepreneurPulse.com.au  Fact Sheet for Healthcare Providers: IncredibleEmployment.be  This test is not yet approved or cleared by the [place] FDA and has been authorized for detection and/or diagnosis of SARS-CoV-2 by FDA under an Emergency Use Authorization (EUA). This EUA will remain in effect (meaning this test can be used) for the duration of the COVID-19 declaration under Section 564(b)(1) of the Act, 21 U.S.C. section 360bbb-3(b)(1), unless the authorization is terminated or revoked.  Performed at [facility], [address] [place], [place] [address]     Coagulation Studies: No results for input(s): LABPROT, INR in the last 72 hours.  Urinalysis: No results for input(s): COLORURINE, LABSPEC, PHURINE, GLUCOSEU, HGBUR, BILIRUBINUR,  KETONESUR, PROTEINUR, UROBILINOGEN, NITRITE, LEUKOCYTESUR in the last 72 hours.  Invalid input(s): APPERANCEUR     Imaging: No results found.   Medications:    sodium chloride     [START ON 12/28/2021] clindamycin (CLEOCIN) IV      amLODipine  10 mg Oral Daily   amLODipine  10 mg Oral Once   ARIPiprazole  1 mg Oral Daily   carvedilol  25 mg Oral BID WC   Chlorhexidine Gluconate Cloth  6 each Topical Q0600   pantoprazole  40 mg Oral BID   rosuvastatin  10 mg Oral Daily   sertraline  100 mg Oral Daily   sodium bicarbonate  650 mg Oral TID   tamsulosin  0.4 mg Oral Daily   acetaminophen **OR** acetaminophen, diphenhydrAMINE, famotidine, hydrALAZINE, methylPREDNISolone (SOLU-MEDROL) injection, midazolam, ondansetron **OR** ondansetron (ZOFRAN) IV, sorbitol, milk of mag, mineral oil, glycerin (SMOG) enema  Assessment/ Plan:  61 y.o. male with a past medical history significant for dementia, hypertension, heart failure, CKD 3a and CVA admitted to [facility] on 12/22/2021 with intractable vomiting.  Also noted to have mild leukocytosis and AKI.   #1 AKI on CKD 3a with baseline creatinine of 1.4 and EGFR of  52.  Patient's latest BUN/creatinine are 50 and 6.94 respectively.  It is felt that acute kidney injury could have been contributed from usage of Advil/NSAIDs and from dehydration secondary to intractable vomiting.   After getting permission to speak freely in front of sister, informed the patient that his renal function has not improved and we feel it is time to initiate temporary hemodialysis.  Discussed with patient and family the reasoning behind this decision, lack of renal recovery and decreased urine output.  Discussed the process of hemodialysis, and starting temp cath, steps to initiating dialysis.  Also discussed temporary remaining a couple treatments to possibly a couple weeks to a couple months but close monitoring will continue.  Patient and family have agreed to this treatment plan  and wished to proceed with temp cath placement.  Contacted vascular surgery for placement of HD temp cath.  Once placed, patient will receive initial dialysis treatment.  We will plan to dialyze patient tomorrow as well.  #2 recurrent vomiting likely multifactorial including advancing dementia ,medication side effect or functional dyspepsia.  GI is consulted.   EGD completed yesterday with no signs of active bleeding.  Did see gastritis, small hiatal hernia and LA grade B reflux esophagitis.  Concerning areas biopsied.  #3 dementia/depression psych following currently on Abilify. Namenda discontinued. Continue to monitor closely.  4. Anemia of chronic kidney disease  Normocytic Lab Results  Component Value Date   HGB 12.7 (L) 12/27/2021  We will continue to monitor hemoglobin   LOS: 3   2/21/202312:45 PM

## 2021-12-27 NOTE — Progress Notes (Signed)
°  Progress Note   Patient: Randall [name] TAV:[id number] DOB: 02-06-1961 DOA: 12/22/2021     3 DOS: the patient was seen and examined on 12/27/2021   Brief hospital course: Taken from prior notes.  Randall [name] is a 61 y.o. male with a PMH significant for dementia, HTN, HFpEF, CKD 3a, CVA. They presented from home to the ED on 12/22/2021 with intractable vomiting which has been ongoing for weeks. He was recently admitted on 2/3 with similar symptoms and had a mostly unremarkable work up. In the ED, it was found that they had normal vital signs, mild leukocytosis, AKI. CT abdomen showed constipation. Otherwise unremarkable labs.  They were treated with supportive care including IV fluids and antiemetics.   Due to persistent nausea and vomiting, GI was consulted and patient underwent EGD yesterday on 12/26/2021 which shows esophagitis, gastritis and few nonbleeding erosions involving first part of duodenum.  Biopsies were taken.  Due to worsening renal function nephrology decided to start dialysis, HD catheter was placed by vascular surgery and patient will receive his first session today.   Assessment and Plan: * Recurrent vomiting- (present on admission) Recurrent episodes with second hospitalization in 2 weeks for the same No definite etiology identified. GI was consulted and patient underwent EGD on 12/26/2020 which shows esophagitis, some esophageal dysmotility, gastritis and few nonbleeding erosions in duodenum, biopsies were taken. -Continue with Protonix -Nutritionist evaluation   Acute renal failure superimposed on stage 3a chronic kidney disease ([place]) Worsening renal function despite gentle IV hydration. Nephrology was consulted and they decided to proceed with hemodialysis. HD catheter placed by vascular surgery and patient will receive his first session today. -Monitor renal function -Avoid nephrotoxins  Chronic systolic CHF (congestive heart failure) ([place])- (present  on admission) Currently euvolemic -Continue Coreg, Carvedilol  -Holding Entresto due to AKI  Dementia ([place])- (present on admission) Continue Namenda and sertraline  History of CVA (cerebrovascular accident) Continue aspirin and statin  Moderate major depression ([place]) - Continue Abilify and Zoloft  Leukocytosis Likely secondary to vomiting Acute infection not suspected   Subjective: Patient denies any nausea and vomiting this morning.  No other complaints.  Ready to start dialysis.  Physical Exam: Vitals:   12/27/21 1615 12/27/21 1630 12/27/21 1645 12/27/21 1700  BP: (!) 172/90 (!) 192/83 (!) 177/88   Pulse: 77 75 76   Resp: 13 15 14 14   Temp:      TempSrc:      SpO2: 92% 95% 91%   Weight:      Height:       General.  Ill-appearing gentleman, in no acute distress. Pulmonary.  Lungs clear bilaterally, normal respiratory effort. CV.  Regular rate and rhythm, no JVD, rub or murmur. Abdomen.  Soft, nontender, nondistended, BS positive. CNS.  Alert and oriented .  No focal neurologic deficit. Extremities.  No edema, no cyanosis, pulses intact and symmetrical. Psychiatry.  Judgment and insight appears normal.  Data Reviewed: Prior notes, labs and imaging reviewed.  Family Communication: Discussed with sister and son at bedside  Disposition: Status is: Inpatient Remains inpatient appropriate because: Severity of illness  Planned Discharge Destination: Home with Home Health  Time spent: 50 minutes  This record has been created using Systems analyst. Errors have been sought and corrected,but may not always be located. Such creation errors do not reflect on the standard of care.  Author: Lorella Nimrod, MD 12/27/2021 5:37 PM  For on call review [email or web address].

## 2021-12-27 NOTE — Progress Notes (Signed)
Made total of 3 attempt to give patient his scheduled night time medication without success. Pt states he is unable to get himself to swallow the bitter substance. Pt starts to gag as soon as he gets the first bit or sip of medication. Pt was able to drink water and eat half cup of ice cream after these episode with minimum issue swallowing. Pt gagged and spit up the medication each time. Due to elevated BP prior to bedtime. Provider was kept in constant loop regarding this as two of the medication were for hypertension management.   Pt received 5mg  VI hydralazine with no improvement in his BP, 10mg  more given per order with good response SBP <160 at recheck. Pt also received IV Protonix prior to bedtime per order.   Pt had one incontinence episode that was unmeasured and one measured urine output of 385ml overnight.

## 2021-12-27 NOTE — Assessment & Plan Note (Signed)
-   Continue Abilify and Zoloft

## 2021-12-27 NOTE — Progress Notes (Signed)
OT Cancellation Note  Patient Details Name: Randall [name] MRN: [id number] DOB: June 03, 1961   Cancelled Treatment:    Reason Eval/Treat Not Completed: Patient at procedure or test/ unavailable. Pt out of the room for procedure. Will re-attempt OT tx at later date/time as pt is available and medically appropriate.   Ardeth Perfect., MPH, MS, OTR/L ascom [phone] 12/27/21, 1:03 PM

## 2021-12-27 NOTE — Progress Notes (Signed)
Physical Therapy Treatment Patient Details Name: Randall [name] MRN: [id number] DOB: Nov 16, 1960 Today's Date: 12/27/2021   History of Present Illness Randall [name] is a 61 y.o. male seen for evaluation of recurrent vomiting at the request of Dr. Judd Gaudier. Patient has a PMH of dementia, hypertension, diabetes, chronic kidney disease, CHF.  Patient presented to the [facility] ED for chief complaint of nausea, vomiting and weakness. Upon presentation to the ED yesterday, vital signs were stable with blood pressure 120/72, heart rate 67, respirations 18, temperature 98.7, pulse oximetry 97%. Labs were significant for new AKI with Cr 3.5, normal electrolytes GFR 19, glucose 176, WBC 17.9, UA unremarkable.  Imaging studies revealed CT of the abdomen pelvis revealed a possible tiny hiatal hernia, constipation, otherwise no acute intra-abdominal or intrapelvic abnormality.  Aortic atherosclerosis.  He is being admitted for IV hydration.  GI is consulted for evaluation and management of recurrent vomiting.    PT Comments    Pt awake and alert resting in bed w/ family at bedside upon PT entrance into room for session. Pt denies any current c/o pain at rest and is willing to work with PT today. Pt is able to perform bed mobility w/ SUPERVISION to sit EOB; once seated EOB he performs sit to stand w/ SUPERVISION using RW. He requested to apply deodorant once standing, and was able to stand to apply deodorant w/o UE support on RW. He was able to ambulate ~172ft w/ CGA using RW and did not have any c/o negative symptoms throughout. Pt returned to recliner w/ all needs within reach before PT exiting room. Pt will benefit from continued skilled PT in order to improve mobility/gait and return to PLOF. Current discharge recommendation remains appropriate due to the level of assistance required by the patient to ensure safety and improve overall function.   Recommendations for follow up therapy are one component of a  multi-disciplinary discharge planning process, led by the attending physician.  Recommendations may be updated based on patient status, additional functional criteria and insurance authorization.  Follow Up Recommendations  Home health PT     Assistance Recommended at Discharge Frequent or constant Supervision/Assistance  Patient can return home with the following A little help with walking and/or transfers;A little help with bathing/dressing/bathroom;Help with stairs or ramp for entrance;Assist for transportation;Direct supervision/assist for financial management;Direct supervision/assist for medications management   Equipment Recommendations  Rolling walker (2 wheels)    Recommendations for Other Services       Precautions / Restrictions Precautions Precautions: Fall Restrictions Weight Bearing Restrictions: No     Mobility  Bed Mobility Overal bed mobility: Needs Assistance Bed Mobility: Supine to Sit     Supine to sit: Supervision          Transfers Overall transfer level: Needs assistance Equipment used: Rolling walker (2 wheels) Transfers: Sit to/from Stand Sit to Stand: Supervision                Ambulation/Gait Ambulation/Gait assistance: Min guard Gait Distance (Feet): 160 Feet Assistive device: Rolling walker (2 wheels) Gait Pattern/deviations: Step-through pattern, Decreased step length - right, Decreased step length - left, Decreased stride length Gait velocity: decreased         Stairs             Wheelchair Mobility    Modified Rankin (Stroke Patients Only)       Balance Overall balance assessment: Needs assistance Sitting-balance support: No upper extremity supported, Feet supported Sitting balance-Leahy Scale: Good  Standing balance support: During functional activity, No upper extremity supported Standing balance-Leahy Scale: Fair Standing balance comment: was able to apply deodorant on while standing.                             Cognition Arousal/Alertness: Awake/alert Behavior During Therapy: Flat affect Overall Cognitive Status: Impaired/Different from baseline                         Following Commands: Follows one step commands consistently       General Comments: soft spoken; conversational during ambulation around nursing station.        Exercises      General Comments        Pertinent Vitals/Pain Pain Assessment Pain Assessment: No/denies pain    Home Living                          Prior Function            PT Goals (current goals can now be found in the care plan section) Progress towards PT goals: Progressing toward goals    Frequency    Min 2X/week      PT Plan Current plan remains appropriate    Co-evaluation              AM-PAC PT "6 Clicks" Mobility   Outcome Measure  Help needed turning from your back to your side while in a flat bed without using bedrails?: A Little Help needed moving from lying on your back to sitting on the side of a flat bed without using bedrails?: A Little Help needed moving to and from a bed to a chair (including a wheelchair)?: A Little Help needed standing up from a chair using your arms (e.g., wheelchair or bedside chair)?: A Little Help needed to walk in hospital room?: A Little Help needed climbing 3-5 steps with a railing? : A Little 6 Click Score: 18    End of Session Equipment Utilized During Treatment: Gait belt Activity Tolerance: Patient limited by fatigue;Patient tolerated treatment well Patient left: with family/visitor present;in chair;with chair alarm set;with call bell/phone within reach Nurse Communication: Mobility status PT Visit Diagnosis: Unsteadiness on feet (R26.81);Muscle weakness (generalized) (M62.81);Difficulty in walking, not elsewhere classified (R26.2)     Time: 0102-7253 PT Time Calculation (min) (ACUTE ONLY): 20 min  Charges:                          Jonnie Kind, SPT 12/27/2021, 11:46 AM

## 2021-12-27 NOTE — Op Note (Signed)
°  OPERATIVE NOTE   PROCEDURE: Ultrasound guidance for vascular access right femoral vein Placement of a 30 cm temporary tryialysis catheter right common femoral vein  PRE-OPERATIVE DIAGNOSIS: 1. Acute on chronic renal failure 2.  Dementia  POST-OPERATIVE DIAGNOSIS: Same  SURGEON: Katha Cabal, MD  ASSISTANT(S): None  ANESTHESIA: local  ESTIMATED BLOOD LOSS: Minimal   FINDING(S): 1.  None  SPECIMEN(S):  None  INDICATIONS:    Patient is a 61 y.o.male who presents with worsening renal failure.  He now requires hemodialysis..  Risks and benefits were discussed, and informed consent was obtained..  DESCRIPTION: After obtaining full informed written consent, the patient was laid flat in the bed.  The right groin was sterilely prepped and draped in a sterile surgical field was created. The right common femoral vein was visualized with ultrasound and found to be widely patent. It was then accessed under direct guidance without difficulty with a Seldinger needle and a permanent image was recorded. A J-wire was then placed. After skin nick and dilatation, a 30 cm trialysis catheter was placed over the wire and the wire was removed. The lumens withdrew dark red nonpulsatile blood and flushed easily with sterile saline. The catheter was secured to the skin with 3 nylon sutures. Sterile dressing was placed.  COMPLICATIONS: None  CONDITION: Stable  Hortencia Pilar 12/27/2021 3:00 PM  This note was created with Dragon Medical transcription system. Any errors in dictation are purely unintentional.

## 2021-12-27 NOTE — Progress Notes (Signed)
Mobility Specialist - Progress Note   12/27/21 1400  Mobility  Activity Off unit     Pt off unit for procedure. Will attempt session another date/time.    Kathee Delton Mobility Specialist 12/27/21, 2:01 PM

## 2021-12-27 NOTE — Anesthesia Postprocedure Evaluation (Signed)
Anesthesia Post Note  Patient: Randall [name]  Procedure(s) Performed: ESOPHAGOGASTRODUODENOSCOPY (EGD) WITH PROPOFOL  Patient location during evaluation: Endoscopy Anesthesia Type: General Level of consciousness: awake and alert Pain management: pain level controlled Vital Signs Assessment: post-procedure vital signs reviewed and stable Respiratory status: spontaneous breathing, nonlabored ventilation, respiratory function stable and patient connected to nasal cannula oxygen Cardiovascular status: blood pressure returned to baseline and stable Postop Assessment: no apparent nausea or vomiting Anesthetic complications: no   No notable events documented.   Last Vitals:  Vitals:   12/27/21 0413 12/27/21 0823  BP: (!) 165/77 (!) 160/86  Pulse: 81 81  Resp: 18 16  Temp: 37.2 C 37 C  SpO2: 99% 100%    Last Pain:  Vitals:   12/27/21 1030  TempSrc:   PainSc: 0-No pain                 Martha Clan

## 2021-12-27 NOTE — Consult Note (Signed)
[facility] Vascular Consult Note  MRN : [id number]  Randall [name] is a 61 y.o. ([birth year]) male who presents with chief complaint of  Chief Complaint  Patient presents with   Emesis  .  History of Present Illness:   I am asked to see the patient by Ms. breeze.  Patient is a 61 year old gentleman admitted 7 days ago with nausea and vomiting.  At the time of admission his creatinine was noted to be markedly elevated at 3.57 up from a baseline of 1.6.  His renal function is continued to deteriorate and he is therefore requiring hemodialysis.  We are asked to evaluate for temporary access.  Patient is quite lethargic and is not answering questions.  Current Facility-Administered Medications  Medication Dose Route Frequency Provider Last Rate Last Admin   acetaminophen (TYLENOL) tablet 650 mg  650 mg Oral Q6H PRN Athena Masse, MD   650 mg at 12/25/21 0329   Or   acetaminophen (TYLENOL) suppository 650 mg  650 mg Rectal Q6H PRN Athena Masse, MD       amLODipine (NORVASC) tablet 10 mg  10 mg Oral Daily Athena Masse, MD   10 mg at 12/25/21 0845   amLODipine (NORVASC) tablet 10 mg  10 mg Oral Once Foust, Katy L, NP       ARIPiprazole (ABILIFY) tablet 1 mg  1 mg Oral Daily Clapacs, John T, MD   1 mg at 12/25/21 0845   carvedilol (COREG) tablet 25 mg  25 mg Oral BID WC Judd Gaudier V, MD   25 mg at 12/26/21 1738   ondansetron (ZOFRAN) tablet 4 mg  4 mg Oral Q6H PRN Athena Masse, MD       Or   ondansetron [facility]) injection 4 mg  4 mg Intravenous Q6H PRN Athena Masse, MD       pantoprazole (PROTONIX) EC tablet 40 mg  40 mg Oral BID Anwar, Shayan S, DO       rosuvastatin (CRESTOR) tablet 40 mg  40 mg Oral Daily Athena Masse, MD   40 mg at 12/25/21 0845   sacubitril-valsartan (ENTRESTO) 97-103 mg per tablet  1 tablet Oral BID Richarda Osmond, MD   1 tablet at 12/25/21 1803   sertraline (ZOLOFT) tablet 100 mg  100 mg Oral Daily Clapacs, John T,  MD   100 mg at 12/25/21 0845   sodium bicarbonate tablet 650 mg  650 mg Oral TID Lorella Nimrod, MD       sorbitol, milk of mag, mineral oil, glycerin (SMOG) enema  960 mL Rectal Daily PRN Richarda Osmond, MD       tamsulosin [facility]) capsule 0.4 mg  0.4 mg Oral Daily Athena Masse, MD   0.4 mg at 12/25/21 0845    Past Medical History:  Diagnosis Date   Dementia ([place])    Diabetes mellitus without complication ([place])    Hypertension     Past Surgical History:  Procedure Laterality Date   ESOPHAGOGASTRODUODENOSCOPY (EGD) WITH PROPOFOL N/A 12/26/2021   Procedure: ESOPHAGOGASTRODUODENOSCOPY (EGD) WITH PROPOFOL;  Surgeon: Annamaria Helling, DO;  Location: [facility] ENDOSCOPY;  Service: Gastroenterology;  Laterality: N/A;    Social History Social History   Tobacco Use   Smoking status: Every Day    Packs/day: 0.50    Types: Cigarettes  Substance Use Topics   Alcohol use: Never   Drug use: Never    Family History No family history of bleeding/clotting  disorders, porphyria or autoimmune disease   Allergies  Allergen Reactions   Penicillins     Reaction in childhood      REVIEW OF SYSTEMS (Negative unless checked)  Constitutional: [] Weight loss  [] Fever  [] Chills Cardiac: [] Chest pain   [] Chest pressure   [] Palpitations   [] Shortness of breath at rest   [] Shortness of breath with exertion. Vascular:  [] Pain in legs with walking   [] Pain in legs at rest  [] History of DVT   [] Phlebitis   [] Swelling in legs   [] Varicose veins   [] Non-healing ulcers Pulmonary:   [] Uses home oxygen   [] Productive cough   [] Hemoptysis   [] Wheeze  [] COPD   [] Asthma Neurologic:  [] Dizziness  [] Blackouts   [] Seizures   [] History of stroke   [] History of TIA  [] Aphasia   [] Temporary blindness   [] Dysphagia   [] Weakness or numbness in arms   [] Weakness or numbness in legs Musculoskeletal:  [] Arthritis   [] Joint swelling   [] Joint pain   [] Low back pain Hematologic:  [] Easy bruising     [] Hypercoagulable state   [] Anemic  [] Hepatitis Gastrointestinal:  [] Blood in stool   [] Vomiting blood  [] Gastroesophageal reflux/heartburn   [] Difficulty swallowing. Genitourinary:  [x] Chronic kidney disease   [] Difficult urination  [] Frequent urination  [] Burning with urination   [] Blood in urine Skin:  [] Rashes   [] Ulcers   Psychological:  [] History of anxiety   []  History of major depression.    Physical Examination  Vitals:   12/27/21 0022 12/27/21 0218 12/27/21 0413 12/27/21 0823  BP: (!) 191/77 (!) 155/76 (!) 165/77 (!) 160/86  Pulse: 78 84 81 81  Resp:   18 16  Temp:   98.9 F (37.2 C) 98.6 F (37 C)  TempSrc:    Oral  SpO2:   99% 100%  Weight:      Height:       Body mass index is 24.96 kg/m.  Head: [place]/AT, No temporalis wasting.  Ear/Nose/Throat: Nares w/o erythema or drainage, oropharynx mucus membranes moist Eyes: PERRLA, Sclera nonicteric.  Neck: Supple,   No JVD.  Pulmonary:  Breath sounds equal bilaterally, no use of accessory muscles.  Cardiac: RRR, normal S1, S2,  Vascular: 2+ radial and femoral pulses bilaterally Gastrointestinal: soft, non-tender, non-distended.  Musculoskeletal: Moves all extremities.  No deformity or atrophy. No edema. Neurologic: 5/5 motor sensory, face appears symmetric speech fluent  Psychiatric: Advanced dementia. Dermatologic: No rashes or ulcers noted.  No cellulitis or open wounds. Lymph : No Cervical,  or Inguinal lymphadenopathy.      CBC Lab Results  Component Value Date   WBC 9.9 12/27/2021   HGB 12.7 (L) 12/27/2021   HCT 37.3 (L) 12/27/2021   MCV 93.0 12/27/2021   PLT 209 12/27/2021    BMET    Component Value Date/Time   NA 139 12/27/2021 0409   K 3.9 12/27/2021 0409   CL 113 (H) 12/27/2021 0409   CO2 15 (L) 12/27/2021 0409   GLUCOSE 88 12/27/2021 0409   BUN 57 (H) 12/27/2021 0409   CREATININE 8.48 (H) 12/27/2021 0409   CALCIUM 8.0 (L) 12/27/2021 0409   GFRNONAA 7 (L) 12/27/2021 0409   Estimated  Creatinine Clearance: 9.3 mL/min (A) (by C-G formula based on SCr of 8.48 mg/dL (H)).  COAG No results found for: INR, PROTIME    Assessment/Plan 1.  End-stage renal disease requiring hemodialysis:  Patient will begin dialysis therapy without further delay.  Temporary dialysis catheter will be placed.  Dialysis has  already been arranged since the patient missed their previous session 2.  Hypertension:  Patient will continue medical management; nephrology is following no changes in oral medications. 3.  Hyperemesis: Likely secondary to his uremia that should improve with dialysis     Hortencia Pilar, MD  12/27/2021 9:18 AM

## 2021-12-28 DIAGNOSIS — R1110 Vomiting, unspecified: Secondary | ICD-10-CM | POA: Diagnosis not present

## 2021-12-28 LAB — RENAL FUNCTION PANEL
Albumin: 2.6 g/dL — ABNORMAL LOW (ref 3.5–5.0)
Anion gap: 12 (ref 5–15)
BUN: 46 mg/dL — ABNORMAL HIGH (ref 6–20)
CO2: 21 mmol/L — ABNORMAL LOW (ref 22–32)
Calcium: 8.1 mg/dL — ABNORMAL LOW (ref 8.9–10.3)
Chloride: 106 mmol/L (ref 98–111)
Creatinine, Ser: 7.45 mg/dL — ABNORMAL HIGH (ref 0.61–1.24)
GFR, Estimated: 8 mL/min — ABNORMAL LOW (ref >60)
Glucose, Bld: 99 mg/dL (ref 70–99)
Phosphorus: 4.1 mg/dL (ref 2.5–4.6)
Potassium: 3.4 mmol/L — ABNORMAL LOW (ref 3.5–5.1)
Sodium: 139 mmol/L (ref 135–145)

## 2021-12-28 LAB — SURGICAL PATHOLOGY

## 2021-12-28 MED ORDER — HEPARIN SODIUM (PORCINE) 1000 UNIT/ML IJ SOLN
INTRAMUSCULAR | Status: AC
  Administered 2021-12-28: 3600 [IU]
  Filled 2021-12-28: qty 10

## 2021-12-28 NOTE — Progress Notes (Addendum)
[facility]  ROUNDING NOTE   Subjective:  Pt is admitted to [facility] on 12/22/2021 with intractable vomiting on 12/22/2021 with intractable vomiting which has been going for weeks.  He noted to have mild leukocytosis and AKI on admission patient has a past medical history significant for dementia, hypertension, HFpEF, CKD 3a and CVA.  We were asked to see the patient for AKI with a significantly elevated BUN and creatinine  Patient seen and evaluated during dialysis   HEMODIALYSIS FLOWSHEET:  Blood Flow Rate (mL/min): 250 mL/min Arterial Pressure (mmHg): -130 mmHg Venous Pressure (mmHg): 120 mmHg Transmembrane Pressure (mmHg): 20 mmHg Ultrafiltration Rate (mL/min): 200 mL/min Dialysate Flow Rate (mL/min): 300 ml/min Conductivity: Machine : 13.9 Conductivity: Machine : 13.9 Dialysis Fluid Bolus: Normal Saline Bolus Amount (mL): 250 mL  Receiving second dialysis treatment No complaints at this time  Objective:  Vital signs in last 24 hours:  Temp:  [98 F (36.7 C)-98.8 F (37.1 C)] 98.8 F (37.1 C) (02/22 0908) Pulse Rate:  [70-90] 83 (02/22 1215) Resp:  [0-18] 15 (02/22 1215) BP: (142-196)/(68-94) 151/77 (02/22 1215) SpO2:  [91 %-100 %] 100 % (02/22 1215) Weight:  [73.7 kg-83.7 kg] 73.7 kg (02/22 1200)  Weight change:  Filed Weights   12/27/21 1727 12/28/21 0908 12/28/21 1200  Weight: 83.7 kg 79.6 kg 73.7 kg    Intake/Output: I/O last 3 completed shifts: In: 518.4 [I.V.:518.4] Out: 302 [Urine:300; Other:2]   Intake/Output this shift:  No intake/output data recorded.  Physical Exam: General: Awake, NAD  Head: Normocephalic, atraumatic. Moist oral mucosal membranes  Eyes: Anicteric  Lungs:  Clear to auscultation, normal effort  Heart: S1S2 no rubs  Abdomen:  Soft, nontender, bowel sounds present  Extremities:  Trace peripheral edema.  Neurologic: Awake, alert, following  few commands  Skin: No acute rash  Access: Right femoral HD temp cath    Basic  Metabolic Panel: Recent Labs  Lab 12/24/21 1532 12/25/21 0529 12/26/21 0535 12/27/21 0409 12/28/21 0421  NA 132* 137 136 139 139  K 3.7 3.7 3.9 3.9 3.4*  CL 107 109 109 113* 106  CO2 18* 18* 18* 15* 21*  GLUCOSE 97 68* 73 88 99  BUN 47* 50* 48* 57* 46*  CREATININE 6.16* 6.94* 7.78* 8.48* 7.45*  CALCIUM 7.7* 8.0* 8.1* 8.0* 8.1*  PHOS  --   --   --  4.7* 4.1     Liver Function Tests: Recent Labs  Lab 12/22/21 1802 12/23/21 1126 12/27/21 0409 12/28/21 0421  AST 18 16  --   --   ALT 14 12  --   --   ALKPHOS 67 58  --   --   BILITOT 0.5 0.5  --   --   PROT 7.2 5.8*  --   --   ALBUMIN 3.7 2.9* 2.7* 2.6*    Recent Labs  Lab 12/22/21 1802  LIPASE 33    No results for input(s): AMMONIA in the last 168 hours.  CBC: Recent Labs  Lab 12/22/21 1802 12/23/21 1126 12/24/21 1532 12/25/21 0529 12/27/21 0409  WBC 17.9* 13.5* 11.0* 10.4 9.9  HGB 13.0 11.5* 11.4* 11.0* 12.7*  HCT 39.3 34.5* 33.7* 32.7* 37.3*  MCV 96.6 95.8 94.4 94.0 93.0  PLT 255 201 199 197 209     Cardiac Enzymes: No results for input(s): CKTOTAL, CKMB, CKMBINDEX, TROPONINI in the last 168 hours.  BNP: Invalid input(s): POCBNP  CBG: Recent Labs  Lab 12/26/21 1314 12/26/21 1402 12/26/21 1442  GLUCAP 62* 97  108*     Microbiology: Results for orders placed or performed during the hospital encounter of 12/22/21  Resp Panel by RT-PCR (Flu A&B, Covid) Nasopharyngeal Swab     Status: None   Collection Time: 12/22/21 11:18 PM   Specimen: Nasopharyngeal Swab; Nasopharyngeal(NP) swabs in vial transport medium  Result Value Ref Range Status   SARS Coronavirus 2 by RT PCR NEGATIVE NEGATIVE Final    Comment: (NOTE) SARS-CoV-2 target nucleic acids are NOT DETECTED.  The SARS-CoV-2 RNA is generally detectable in upper respiratory specimens during the acute phase of infection. The lowest concentration of SARS-CoV-2 viral copies this assay can detect is 138 copies/mL. A negative result does not  preclude SARS-Cov-2 infection and should not be used as the sole basis for treatment or other patient management decisions. A negative result may occur with  improper specimen collection/handling, submission of specimen other than nasopharyngeal swab, presence of viral mutation(s) within the areas targeted by this assay, and inadequate number of viral copies(<138 copies/mL). A negative result must be combined with clinical observations, patient history, and epidemiological information. The expected result is Negative.  Fact Sheet for Patients:  EntrepreneurPulse.com.au  Fact Sheet for Healthcare Providers:  IncredibleEmployment.be  This test is no t yet approved or cleared by the [place] FDA and  has been authorized for detection and/or diagnosis of SARS-CoV-2 by FDA under an Emergency Use Authorization (EUA). This EUA will remain  in effect (meaning this test can be used) for the duration of the COVID-19 declaration under Section 564(b)(1) of the Act, 21 U.S.C.section 360bbb-3(b)(1), unless the authorization is terminated  or revoked sooner.       Influenza A by PCR NEGATIVE NEGATIVE Final   Influenza B by PCR NEGATIVE NEGATIVE Final    Comment: (NOTE) The Xpert Xpress SARS-CoV-2/FLU/RSV plus assay is intended as an aid in the diagnosis of influenza from Nasopharyngeal swab specimens and should not be used as a sole basis for treatment. Nasal washings and aspirates are unacceptable for Xpert Xpress SARS-CoV-2/FLU/RSV testing.  Fact Sheet for Patients: EntrepreneurPulse.com.au  Fact Sheet for Healthcare Providers: IncredibleEmployment.be  This test is not yet approved or cleared by the [place] FDA and has been authorized for detection and/or diagnosis of SARS-CoV-2 by FDA under an Emergency Use Authorization (EUA). This EUA will remain in effect (meaning this test can be used) for the  duration of the COVID-19 declaration under Section 564(b)(1) of the Act, 21 U.S.C. section 360bbb-3(b)(1), unless the authorization is terminated or revoked.  Performed at [facility], [address] [place], [place] [address]     Coagulation Studies: No results for input(s): LABPROT, INR in the last 72 hours.  Urinalysis: No results for input(s): COLORURINE, LABSPEC, PHURINE, GLUCOSEU, HGBUR, BILIRUBINUR, KETONESUR, PROTEINUR, UROBILINOGEN, NITRITE, LEUKOCYTESUR in the last 72 hours.  Invalid input(s): APPERANCEUR     Imaging: PERIPHERAL VASCULAR CATHETERIZATION  Result Date: 12/27/2021 See surgical note for result.    Medications:      amLODipine  10 mg Oral Daily   ARIPiprazole  1 mg Oral Daily   carvedilol  25 mg Oral BID WC   Chlorhexidine Gluconate Cloth  6 each Topical Q0600   pantoprazole  40 mg Oral BID   rosuvastatin  10 mg Oral Daily   sertraline  100 mg Oral Daily   sodium bicarbonate  650 mg Oral TID   tamsulosin  0.4 mg Oral Daily   acetaminophen **OR** acetaminophen, hydrALAZINE, ondansetron **OR** ondansetron (ZOFRAN) IV, sorbitol, milk of mag,  mineral oil, glycerin (SMOG) enema  Assessment/ Plan:  61 y.o. male with a past medical history significant for dementia, hypertension, heart failure, CKD 3a and CVA admitted to [facility] on 12/22/2021 with intractable vomiting.  Also noted to have mild leukocytosis and AKI.   #1 AKI on CKD 3a with baseline creatinine of 1.4 and EGFR of 52.  Patient's latest BUN/creatinine are 50 and 6.94 respectively.  It is felt that acute kidney injury could have been contributed from usage of Advil/NSAIDs and from dehydration secondary to intractable vomiting.   Appreciate vascular placing HD femoral temp catheter yesterday to proceed with dialysis treatments.  Patient received initial dialysis treatment yesterday, tolerated well, no UF.  Patient currently receiving second dialysis treatment, also tolerating well.  Plans  to provide third dialysis treatment tomorrow.  Will evaluate renal function at that point.  If continued dialysis is required, will consult vascular to place PermCath.  #2 recurrent vomiting likely multifactorial including advancing dementia ,medication side effect or functional dyspepsia.  GI is consulted.   EGD completed with no signs of active bleeding.  Did see gastritis, small hiatal hernia and LA grade B reflux esophagitis.  Concerning areas biopsied.  3. Anemia of chronic kidney disease  Normocytic Lab Results  Component Value Date   HGB 12.7 (L) 12/27/2021  Hemoglobin at goal, no need for ESA's at this time   LOS: 4   2/22/20232:08 PM

## 2021-12-28 NOTE — Assessment & Plan Note (Signed)
Improved. Recurrent episodes with second hospitalization in 2 weeks for the same No definite etiology identified. GI was consulted and patient underwent EGD on 12/26/2020 which shows esophagitis, some esophageal dysmotility, gastritis and few nonbleeding erosions in duodenum, biopsies were taken. -Continue with Protonix -Nutritionist evaluation

## 2021-12-28 NOTE — Assessment & Plan Note (Signed)
Worsening renal function despite gentle IV hydration. Nephrology was consulted and they decided to proceed with hemodialysis. Temporary HD catheter placed by vascular surgery and patient received second session of dialysis today. Most likely will need permanent HD catheter placement and outpatient dialysis chair before discharge.  Remained oliguric, might be becoming ESRD -Monitor renal function -Avoid nephrotoxins

## 2021-12-28 NOTE — Progress Notes (Signed)
Progress Note   Patient: TYRIQUE SPORN URK:[id number] DOB: July 03, 1961 DOA: 12/22/2021     4 DOS: the patient was seen and examined on 12/28/2021   Brief hospital course: Taken from prior notes.  ARJUNA DOEDEN is a 61 y.o. male with a PMH significant for dementia, HTN, HFpEF, CKD 3a, CVA. They presented from home to the ED on 12/22/2021 with intractable vomiting which has been ongoing for weeks. He was recently admitted on 2/3 with similar symptoms and had a mostly unremarkable work up. In the ED, it was found that they had normal vital signs, mild leukocytosis, AKI. CT abdomen showed constipation. Otherwise unremarkable labs.  They were treated with supportive care including IV fluids and antiemetics.   Due to persistent nausea and vomiting, GI was consulted and patient underwent EGD yesterday on 12/26/2021 which shows esophagitis, gastritis and few nonbleeding erosions involving first part of duodenum.  Biopsies were taken.  Due to worsening renal function nephrology decided to start dialysis, HD catheter was placed by vascular surgery and patient will receive his first session today.  2/22.  Patient receives second session of dialysis today.  Most likely will need permanent HD catheter and outpatient dialysis chair before discharge. Remained oliguric   Assessment and Plan: * Recurrent vomiting- (present on admission) Improved. Recurrent episodes with second hospitalization in 2 weeks for the same No definite etiology identified. GI was consulted and patient underwent EGD on 12/26/2020 which shows esophagitis, some esophageal dysmotility, gastritis and few nonbleeding erosions in duodenum, biopsies were taken. -Continue with Protonix -Nutritionist evaluation   Acute renal failure superimposed on stage 3a chronic kidney disease ([place]) Worsening renal function despite gentle IV hydration. Nephrology was consulted and they decided to proceed with hemodialysis. Temporary HD  catheter placed by vascular surgery and patient received second session of dialysis today. Most likely will need permanent HD catheter placement and outpatient dialysis chair before discharge.  Remained oliguric, might be becoming ESRD -Monitor renal function -Avoid nephrotoxins  Chronic systolic CHF (congestive heart failure) ([place])- (present on admission) Currently euvolemic -Continue Coreg, Carvedilol  -Holding Entresto due to AKI  Dementia ([place])- (present on admission) Continue Namenda and sertraline  History of CVA (cerebrovascular accident) Continue aspirin and statin  Moderate major depression ([place]) - Continue Abilify and Zoloft  Leukocytosis Likely secondary to vomiting Acute infection not suspected   Subjective: Patient was seen during dialysis today.  No new complaints.  Denies any more nausea or vomiting.  Able to tolerate diet.  Physical Exam: Vitals:   12/28/21 1156 12/28/21 1200 12/28/21 1215 12/28/21 1548  BP: (!) 171/78 (!) 166/77 (!) 151/77 (!) 188/77  Pulse: 74 70 83 80  Resp: 13 14 15 16   Temp:    98.6 F (37 C)  TempSrc:    Oral  SpO2: 100% 99% 100% 100%  Weight:  73.7 kg    Height:       General.     In no acute distress. Pulmonary.  Lungs clear bilaterally, normal respiratory effort. CV.  Regular rate and rhythm, no JVD, rub or murmur. Abdomen.  Soft, nontender, nondistended, BS positive. CNS.  Alert and oriented x3.  No focal neurologic deficit. Extremities.  No edema, no cyanosis, pulses intact and symmetrical. Psychiatry.  Judgment and insight appears normal.  Data Reviewed: I reviewed prior notes and labs.  Family Communication: Discussed with sister  Disposition: Status is: Inpatient Remains inpatient appropriate because: Severity of illness, will need outpatient dialysis chair before discharge   Planned Discharge  Destination: Home with Home Health  Time spent: 40 minutes  This record has been created using TEFL teacher. Errors have been sought and corrected,but may not always be located. Such creation errors do not reflect on the standard of care.  Author: Lorella Nimrod, MD 12/28/2021 5:14 PM  For on call review [email or web address].

## 2021-12-28 NOTE — Progress Notes (Signed)
Hemodialysis notes HD treatment completed. Tolerated well. No complications. Pt asymptomatic all throughout the treatment. Total treatment time: 2.5 hours. No fluid removal.

## 2021-12-28 NOTE — Progress Notes (Signed)
Mobility Specialist - Progress Note    12/28/21 1500  Mobility  Activity Ambulated with assistance in hallway;Stood at bedside;Dangled on edge of bed  Level of Assistance Standby assist, set-up cues, supervision of patient - no hands on  Assistive Device Standard walker  Distance Ambulated (ft) 170 ft  Activity Response Tolerated well  $Mobility charge 1 Mobility    During mobility: 104 HR, BP, 98% SpO2  Pt supine upon arrival utilizing RA. Pt sat EOB ModI + extra time needed and STS with supervision. Pt ambulated 139ft with supervision ---- voiced no complaints. Pt ambulated 30ft w/ no AD and CGA ---- noted mild LOB. Pt returned to bed with bed alarm set, needs in reach and family at bedside.  Merrily Brittle Mobility Specialist 12/28/21, 3:13 PM

## 2021-12-28 NOTE — Progress Notes (Signed)
Occupational Therapy Treatment Patient Details Name: Randall [name] MRN: [id number] DOB: November 13, 1960 Today's Date: 12/28/2021   History of present illness Randall [name] is a 61 y.o. male seen for evaluation of recurrent vomiting at the request of Dr. Judd Gaudier. Patient has a PMH of dementia, hypertension, diabetes, chronic kidney disease, CHF.  Patient presented to the [facility] ED for chief complaint of nausea, vomiting and weakness. Upon presentation to the ED yesterday, vital signs were stable with blood pressure 120/72, heart rate 67, respirations 18, temperature 98.7, pulse oximetry 97%. Labs were significant for new AKI with Cr 3.5, normal electrolytes GFR 19, glucose 176, WBC 17.9, UA unremarkable.  Imaging studies revealed CT of the abdomen pelvis revealed a possible tiny hiatal hernia, constipation, otherwise no acute intra-abdominal or intrapelvic abnormality.  Aortic atherosclerosis.  He is being admitted for IV hydration.  GI is consulted for evaluation and management of recurrent vomiting.   OT comments  OT presents to room for tx, pt endorses feeling fatigued, but is agreeable to washing up. He is able to come to sitting with SUPV and demos G sitting balance. He requires Minimal cues for safe use of RW and CTS with CGA to SUPV. He demos F standing balance. OT engages pt in fxl mobility to restroom with cues for safe use of walker and cues to negotiate. He requires MIN to MOD A for LB portions of bathing/dressing tasks and SETUP to MIN A for UB portions (UB performed in sitting, see below for more detail). He completes fxl mobility back to bed and is left with all needs met and in reach. OT updated nurse and family. OT will continue to follow acutely.    Recommendations for follow up therapy are one component of a multi-disciplinary discharge planning process, led by the attending physician.  Recommendations may be updated based on patient status, additional functional criteria and  insurance authorization.    Follow Up Recommendations  Home health OT    Assistance Recommended at Discharge Frequent or constant Supervision/Assistance  Patient can return home with the following  A little help with walking and/or transfers;Assistance with cooking/housework;Assist for transportation;Direct supervision/assist for medications management;Direct supervision/assist for financial management;Help with stairs or ramp for entrance;A little help with bathing/dressing/bathroom   Equipment Recommendations  BSC/3in1    Recommendations for Other Services      Precautions / Restrictions Precautions Precautions: Fall Restrictions Weight Bearing Restrictions: No       Mobility Bed Mobility Overal bed mobility: Needs Assistance Bed Mobility: Supine to Sit     Supine to sit: Supervision          Transfers Overall transfer level: Needs assistance Equipment used: Rolling walker (2 wheels) Transfers: Sit to/from Stand Sit to Stand: Supervision, Min guard           General transfer comment: CGA intiailly with progress to SUPV     Balance Overall balance assessment: Needs assistance Sitting-balance support: No upper extremity supported, Feet supported Sitting balance-Leahy Scale: Good     Standing balance support: During functional activity, No upper extremity supported Standing balance-Leahy Scale: Fair                             ADL either performed or assessed with clinical judgement   ADL Overall ADL's : Needs assistance/impaired         Upper Body Bathing: Set up;Supervision/ safety;Standing   Lower Body Bathing: Minimal assistance;Sit to/from stand  Upper Body Dressing : Set up;Sitting   Lower Body Dressing: Minimal assistance;Moderate assistance;Sit to/from stand Lower Body Dressing Details (indicate cue type and reason): to thread pants/underwear Toilet Transfer: Min guard;BSC/3in1           Functional mobility during ADLs:  Supervision/safety;Min guard;Rolling walker (2 wheels) (cues for safe use of RW and to navigate his room)      Extremity/Trunk Assessment              Vision       Perception     Praxis      Cognition Arousal/Alertness: Awake/alert Behavior During Therapy: Flat affect Overall Cognitive Status: Impaired/Different from baseline                                 General Comments: soft spoken, requires gentle cues to sequence, appropriate with all commands        Exercises Other Exercises Other Exercises: OT engages pt in bathing and dressing tasks.    Shoulder Instructions       General Comments      Pertinent Vitals/ Pain       Pain Assessment Pain Assessment: No/denies pain  Home Living                                          Prior Functioning/Environment              Frequency  Min 2X/week        Progress Toward Goals  OT Goals(current goals can now be found in the care plan section)  Progress towards OT goals: Progressing toward goals  Acute Rehab OT Goals Patient Stated Goal: go home OT Goal Formulation: With patient/family Time For Goal Achievement: 01/06/22 Potential to Achieve Goals: Good  Plan Discharge plan remains appropriate;Frequency remains appropriate    Co-evaluation                 AM-PAC OT "6 Clicks" Daily Activity     Outcome Measure   Help from another person eating meals?: None Help from another person taking care of personal grooming?: A Little Help from another person toileting, which includes using toliet, bedpan, or urinal?: A Little Help from another person bathing (including washing, rinsing, drying)?: A Little Help from another person to put on and taking off regular upper body clothing?: None Help from another person to put on and taking off regular lower body clothing?: A Little 6 Click Score: 20    End of Session Equipment Utilized During Treatment: Rolling walker (2  wheels);Gait belt  OT Visit Diagnosis: Other abnormalities of gait and mobility (R26.89);Pain Pain - Right/Left: Left Pain - part of body: Shoulder   Activity Tolerance Patient tolerated treatment well   Patient Left in bed;with call bell/phone within reach;with bed alarm set   Nurse Communication Patient requests pain meds        Time: 0254-2706 OT Time Calculation (min): 24 min  Charges: OT General Charges $OT Visit: 1 Visit OT Treatments $Self Care/Home Management : 8-22 mins $Therapeutic Activity: 8-22 mins  Gerrianne Scale, [place], OTR/L ascom [phone] 12/28/21, 5:34 PM

## 2021-12-28 NOTE — Progress Notes (Signed)
Mobility Specialist - Progress Note    12/28/21 1400  Mobility  Activity Refused mobility   Pt supine using RA upon arrival. Pt refused session d/t eating lunch. Will attempt another date and time.  Merrily Brittle Mobility Specialist 12/28/21, 2:10 PM

## 2021-12-28 NOTE — Progress Notes (Signed)
Mobility Specialist - Progress Note   12/28/21 1126  Mobility  Activity Off unit     Pt off unit for HD. Will attempt session another date/time.    Kathee Delton Mobility Specialist 12/28/21, 11:27 AM

## 2021-12-29 DIAGNOSIS — R1110 Vomiting, unspecified: Secondary | ICD-10-CM | POA: Diagnosis not present

## 2021-12-29 LAB — RENAL FUNCTION PANEL
Albumin: 2.7 g/dL — ABNORMAL LOW (ref 3.5–5.0)
Anion gap: 11 (ref 5–15)
BUN: 33 mg/dL — ABNORMAL HIGH (ref 6–20)
CO2: 25 mmol/L (ref 22–32)
Calcium: 7.9 mg/dL — ABNORMAL LOW (ref 8.9–10.3)
Chloride: 102 mmol/L (ref 98–111)
Creatinine, Ser: 6.94 mg/dL — ABNORMAL HIGH (ref 0.61–1.24)
GFR, Estimated: 8 mL/min — ABNORMAL LOW (ref >60)
Glucose, Bld: 107 mg/dL — ABNORMAL HIGH (ref 70–99)
Phosphorus: 3 mg/dL (ref 2.5–4.6)
Potassium: 3.2 mmol/L — ABNORMAL LOW (ref 3.5–5.1)
Sodium: 138 mmol/L (ref 135–145)

## 2021-12-29 LAB — HEPATITIS B CORE ANTIBODY, TOTAL: Hep B Core Total Ab: NONREACTIVE

## 2021-12-29 LAB — HEPATITIS B SURFACE ANTIGEN: Hepatitis B Surface Ag: NONREACTIVE

## 2021-12-29 LAB — GLUCOSE, CAPILLARY: Glucose-Capillary: 106 mg/dL — ABNORMAL HIGH (ref 70–99)

## 2021-12-29 MED ORDER — HEPARIN SODIUM (PORCINE) 1000 UNIT/ML IJ SOLN
INTRAMUSCULAR | Status: AC
  Filled 2021-12-29: qty 10

## 2021-12-29 MED ORDER — ONDANSETRON HCL 4 MG/2ML IJ SOLN
INTRAMUSCULAR | Status: AC
  Filled 2021-12-29: qty 2

## 2021-12-29 MED ORDER — DICLOFENAC SODIUM 1 % EX GEL
2.0000 g | Freq: Four times a day (QID) | CUTANEOUS | Status: DC
Start: 2021-12-29 — End: 2022-01-17
  Administered 2021-12-29 – 2022-01-17 (×62): 2 g via TOPICAL
  Filled 2021-12-29: qty 100

## 2021-12-29 NOTE — Assessment & Plan Note (Signed)
Continue Namenda and sertraline

## 2021-12-29 NOTE — Assessment & Plan Note (Signed)
Worsening renal function despite gentle IV hydration. Nephrology was consulted and they decided to proceed with hemodialysis. Temporary HD catheter placed by vascular surgery and patient received third session of dialysis today. Patient remained oliguric with not much improvement in renal function.  Permanent HD catheter will be placed tomorrow, nephrology started working on outpatient dialysis chair.  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

## 2021-12-29 NOTE — Progress Notes (Signed)
°   12/29/21 0952 12/29/21 1000 12/29/21 1015  Vitals  Temp 98.9 F (37.2 C)  --   --   BP (!) 155/72 (!) 150/68 126/64  MAP (mmHg) 95 92 80  Pulse Rate 81 78 80  ECG Heart Rate 81 78 80  Resp 14 17 19   During Hemodialysis Assessment  Blood Flow Rate (mL/min) 250 mL/min (bfr decreased AP high) 250 mL/min 200 mL/min  Arterial Pressure (mmHg) -260 mmHg -210 mmHg -240 mmHg  Venous Pressure (mmHg) 90 mmHg 90 mmHg 70 mmHg  Transmembrane Pressure (mmHg) 60 mmHg 60 mmHg 60 mmHg  Ultrafiltration Rate (mL/min) 500 mL/min 500 mL/min 500 mL/min  Dialysate Flow Rate (mL/min) 500 ml/min 500 ml/min 500 ml/min  Conductivity: Machine  13.7 13.7 13.6  HD Safety Checks Performed Yes Yes Yes  Dialysis Fluid Bolus Normal Saline  --   --   Bolus Amount (mL) 250 mL  --   --   Intra-Hemodialysis Comments Tx initiated;Progressing as prescribed Progressing as prescribed Progressing as prescribed    12/29/21 1030 12/29/21 1045 12/29/21 1100  Vitals  Temp  --   --   --   BP (!) 145/69 (!) 146/73 (!) 144/68  MAP (mmHg) 91 90 87  Pulse Rate 77 83 78  ECG Heart Rate 78 82 79  Resp 16 14 (!) 5  During Hemodialysis Assessment  Blood Flow Rate (mL/min) 200 mL/min 200 mL/min 200 mL/min  Arterial Pressure (mmHg) -240 mmHg -140 mmHg -140 mmHg  Venous Pressure (mmHg) 90 mmHg 70 mmHg 70 mmHg  Transmembrane Pressure (mmHg) 60 mmHg 60 mmHg 60 mmHg  Ultrafiltration Rate (mL/min) 500 mL/min 500 mL/min 500 mL/min  Dialysate Flow Rate (mL/min) 500 ml/min 500 ml/min 500 ml/min  Conductivity: Machine  13.7 13.7 13.7  HD Safety Checks Performed Yes Yes Yes  Dialysis Fluid Bolus  --   --   --   Bolus Amount (mL)  --   --   --   Intra-Hemodialysis Comments Progressing as prescribed Progressing as prescribed Progressing as prescribed

## 2021-12-29 NOTE — Progress Notes (Signed)
Patient scheduled for 3-hour hemodialysis treatment unable to complete due to malfunctioning femoral catheter. Catheter unable to maintain prescribed blood flow rate despite efforts to adjust. Targeted UF unmet. Patient did complain of being nauseated PRN dose of Zofran given with relief. Patient is scheduled for placement of permanent catheter on 12/30/21, will have treatment thereafter. Patient transferred back to assigned room, report given.

## 2021-12-29 NOTE — TOC Progression Note (Signed)
Transition of Care [facility]) - Progression Note    Patient Details  Name: Randall [name] MRN: [id number] Date of Birth: June 29, 1961  Transition of Care [facility]) CM/SW Contact  Beverly Sessions, RN Phone Number: 12/29/2021, 11:30 AM  Clinical Narrative:    Notified by HD coordinator that plan for placement for perm cath, and patient will require outpatient HD placement  [facility] with [place] notified         Expected Discharge Plan and Services                           DME Arranged: 3-N-1, Walker rolling DME Agency: [employer] Date DME Agency Contacted: 12/24/21 Time DME Agency Contacted: [id number] Representative spoke with at DME Agency: [place] Interventions    Readmission Risk Interventions No flowsheet data found.

## 2021-12-29 NOTE — Progress Notes (Signed)
Mobility Specialist - Progress Note   12/29/21 1500  Mobility  Range of Motion/Exercises Right arm;Left arm  Level of Assistance Standby assist, set-up cues, supervision of patient - no hands on  Assistive Device None  Distance Ambulated (ft) 0 ft  Activity Response Tolerated well  $Mobility charge 1 Mobility    Pt lying in bed upon arrival, utilizing RA. Does appears lethargic, but agreeable to session. Pt participated in UB therex d/t femoral cath restrictions. Noted RUE weaker than LUE. Extensive time needed to complete tasks such as: pressing buttons and twisting bottle caps with RUE. Fatigued with activity, several rest breaks taken in between exercises and tasks. Pt left in bed with alarm set, family at bedside.    Kathee Delton Mobility Specialist 12/29/21, 3:36 PM

## 2021-12-29 NOTE — Progress Notes (Signed)
12/28/21 0918 12/28/21 0925 12/28/21 0930  Vitals  BP  --  (!) 168/83 (!) 170/79  MAP (mmHg)  --  108 106  Pulse Rate  --  75 75  ECG Heart Rate  --  77 75  Resp  --  (!) 0 14  During Hemodialysis Assessment  Blood Flow Rate (mL/min) 250 mL/min  --  250 mL/min  Arterial Pressure (mmHg) -190 mmHg  --  -140 mmHg  Venous Pressure (mmHg) 100 mmHg  --  100 mmHg  Transmembrane Pressure (mmHg) 30 mmHg  --  30 mmHg  Ultrafiltration Rate (mL/min) 200 mL/min  --  200 mL/min  Dialysate Flow Rate (mL/min) 300 ml/min  --  300 ml/min  Conductivity: Machine  13.9  --  13.6  HD Safety Checks Performed Yes  --  Yes  Dialysis Fluid Bolus Normal Saline  --   --   Bolus Amount (mL) 250 mL  --   --   Intra-Hemodialysis Comments Tx initiated  --  Progressing as prescribed    12/28/21 0945 12/28/21 1000 12/28/21 1015  Vitals  BP (!) 158/87 (!) 165/80 (!) 151/78  MAP (mmHg) 105 103 96  Pulse Rate 80 81 80  ECG Heart Rate 80 79 81  Resp 15 14 12   During Hemodialysis Assessment  Blood Flow Rate (mL/min) 250 mL/min 250 mL/min 250 mL/min  Arterial Pressure (mmHg) -130 mmHg -130 mmHg -140 mmHg  Venous Pressure (mmHg) 100 mmHg 100 mmHg 100 mmHg  Transmembrane Pressure (mmHg) 30 mmHg 30 mmHg 30 mmHg  Ultrafiltration Rate (mL/min) 200 mL/min 200 mL/min 200 mL/min  Dialysate Flow Rate (mL/min) 300 ml/min 300 ml/min 300 ml/min  Conductivity: Machine  13.6 13.6 13.6  HD Safety Checks Performed Yes Yes Yes  Dialysis Fluid Bolus  --   --   --   Bolus Amount (mL)  --   --   --   Intra-Hemodialysis Comments Progressing as prescribed Progressing as prescribed Progressing as prescribed    12/28/21 1030 12/28/21 1045 12/28/21 1100  Vitals  BP (!) 142/94 (!) 179/71 (!) 162/83  MAP (mmHg) 99 97 105  Pulse Rate 80 81 79  ECG Heart Rate 81 79 78  Resp 16 13 14   During Hemodialysis Assessment  Blood Flow Rate (mL/min) 250 mL/min 250 mL/min 250 mL/min  Arterial Pressure (mmHg) -140 mmHg -140 mmHg -140 mmHg   Venous Pressure (mmHg) 100 mmHg 100 mmHg 100 mmHg  Transmembrane Pressure (mmHg) 30 mmHg 30 mmHg 30 mmHg  Ultrafiltration Rate (mL/min) 200 mL/min 200 mL/min 200 mL/min  Dialysate Flow Rate (mL/min) 300 ml/min 300 ml/min 300 ml/min  Conductivity: Machine  13.6 13.6 13.6  HD Safety Checks Performed Yes Yes Yes  Dialysis Fluid Bolus  --   --   --   Bolus Amount (mL)  --   --   --   Intra-Hemodialysis Comments Progressing as prescribed Progressing as prescribed Progressing as prescribed    12/28/21 1115 12/28/21 1130 12/28/21 1145  Vitals  BP (!) 152/77 (!) 158/85 (!) 155/78  MAP (mmHg) 98 106 101  Pulse Rate 79 80 76  ECG Heart Rate 78 78 77  Resp 15 14 16   During Hemodialysis Assessment  Blood Flow Rate (mL/min) 250 mL/min 250 mL/min 250 mL/min  Arterial Pressure (mmHg) -150 mmHg -130 mmHg -130 mmHg  Venous Pressure (mmHg) 100 mmHg 120 mmHg 120 mmHg  Transmembrane Pressure (mmHg) 40 mmHg 20 mmHg 20 mmHg  Ultrafiltration Rate (mL/min) 200 mL/min 200 mL/min 200 mL/min  Dialysate Flow Rate (mL/min) 300 ml/min 300 ml/min 300 ml/min  Conductivity: Machine  13.6 13.9 13.9  HD Safety Checks Performed Yes Yes Yes  Dialysis Fluid Bolus  --   --   --   Bolus Amount (mL)  --   --   --   Intra-Hemodialysis Comments Progressing as prescribed Progressing as prescribed Progressing as prescribed    12/28/21 1156  Vitals  BP (!) 171/78  MAP (mmHg) 102  Pulse Rate 74  ECG Heart Rate 74  Resp 13  During Hemodialysis Assessment  Blood Flow Rate (mL/min)  --   Arterial Pressure (mmHg)  --   Venous Pressure (mmHg)  --   Transmembrane Pressure (mmHg)  --   Ultrafiltration Rate (mL/min)  --   Dialysate Flow Rate (mL/min)  --   Conductivity: Machine   --   HD Safety Checks Performed  --   Dialysis Fluid Bolus  --   Bolus Amount (mL)  --   Intra-Hemodialysis Comments Tx completed

## 2021-12-29 NOTE — Progress Notes (Signed)
Following for outpatient hemodialysis placement.

## 2021-12-29 NOTE — Assessment & Plan Note (Signed)
Currently euvolemic -Continue Coreg, Carvedilol  -Holding Entresto due to AKI

## 2021-12-29 NOTE — Progress Notes (Signed)
Care RN went to collect vitals on this patient. Pt was resting with 4 covers covering him. Care RN noted patient was diaphoretic with is gown wet from sweating. CBG checked and was 106. Care RN gave patient bath, made environmental changes, completed CHG bath. VS notable for temp of 99.76F and BP 129/69 while lying on his right side. Pt has been hypertensive with SBP in 160s-180s previously. No other gross changes noted at this time.   After bath patient was experiencing mild shivering. Pt given two blankets.

## 2021-12-29 NOTE — Progress Notes (Signed)
[facility]  ROUNDING NOTE   Subjective:  Pt is admitted to [facility] on 12/22/2021 with intractable vomiting on 12/22/2021 with intractable vomiting which has been going for weeks.  He noted to have mild leukocytosis and AKI on admission patient has a past medical history significant for dementia, hypertension, HFpEF, CKD 3a and CVA.  We were asked to see the patient for AKI with a significantly elevated BUN and creatinine  Patient seen and evaluated during dialysis   HEMODIALYSIS FLOWSHEET:  Blood Flow Rate (mL/min): 250 mL/min Arterial Pressure (mmHg): -140 mmHg Venous Pressure (mmHg): 120 mmHg Transmembrane Pressure (mmHg): 60 mmHg Ultrafiltration Rate (mL/min): 500 mL/min Dialysate Flow Rate (mL/min): 500 ml/min Conductivity: Machine : 13.7 Conductivity: Machine : 13.7 Dialysis Fluid Bolus: Normal Saline Bolus Amount (mL): 250 mL  No complaints at this time  Objective:  Vital signs in last 24 hours:  Temp:  [97.9 F (36.6 C)-99.5 F (37.5 C)] 98.9 F (37.2 C) (02/23 0952) Pulse Rate:  [75-92] 83 (02/23 1145) Resp:  [5-20] 17 (02/23 1145) BP: (106-188)/(61-80) 109/61 (02/23 1145) SpO2:  [94 %-100 %] 94 % (02/23 1128) Weight:  [79.5 kg-79.6 kg] 79.5 kg (02/23 1128)  Weight change: -3.7 kg Filed Weights   12/28/21 1200 12/29/21 0949 12/29/21 1128  Weight: 73.7 kg 79.6 kg 79.5 kg    Intake/Output: I/O last 3 completed shifts: In: 240 [P.O.:240] Out: 0    Intake/Output this shift:  Total I/O In: -  Out: 192 [Other:192]  Physical Exam: General: Awake, NAD  Head: Normocephalic, atraumatic. Moist oral mucosal membranes  Eyes: Anicteric  Lungs:  Clear to auscultation, normal effort  Heart: S1S2 no rubs  Abdomen:  Soft, nontender, bowel sounds present  Extremities:  Trace peripheral edema.  Neurologic: Awake, alert, following  few commands  Skin: No acute rash  Access: Right femoral HD temp cath    Basic Metabolic Panel: Recent Labs  Lab  12/25/21 0529 12/26/21 0535 12/27/21 0409 12/28/21 0421 12/29/21 0445  NA 137 136 139 139 138  K 3.7 3.9 3.9 3.4* 3.2*  CL 109 109 113* 106 102  CO2 18* 18* 15* 21* 25  GLUCOSE 68* 73 88 99 107*  BUN 50* 48* 57* 46* 33*  CREATININE 6.94* 7.78* 8.48* 7.45* 6.94*  CALCIUM 8.0* 8.1* 8.0* 8.1* 7.9*  PHOS  --   --  4.7* 4.1 3.0     Liver Function Tests: Recent Labs  Lab 12/22/21 1802 12/23/21 1126 12/27/21 0409 12/28/21 0421 12/29/21 0445  AST 18 16  --   --   --   ALT 14 12  --   --   --   ALKPHOS 67 58  --   --   --   BILITOT 0.5 0.5  --   --   --   PROT 7.2 5.8*  --   --   --   ALBUMIN 3.7 2.9* 2.7* 2.6* 2.7*    Recent Labs  Lab 12/22/21 1802  LIPASE 33    No results for input(s): AMMONIA in the last 168 hours.  CBC: Recent Labs  Lab 12/22/21 1802 12/23/21 1126 12/24/21 1532 12/25/21 0529 12/27/21 0409  WBC 17.9* 13.5* 11.0* 10.4 9.9  HGB 13.0 11.5* 11.4* 11.0* 12.7*  HCT 39.3 34.5* 33.7* 32.7* 37.3*  MCV 96.6 95.8 94.4 94.0 93.0  PLT 255 201 199 197 209     Cardiac Enzymes: No results for input(s): CKTOTAL, CKMB, CKMBINDEX, TROPONINI in the last 168 hours.  BNP: Invalid input(s): POCBNP  CBG: Recent Labs  Lab 12/26/21 1314 12/26/21 1402 12/26/21 1442 12/29/21 0421  GLUCAP 62* 97 108* 106*     Microbiology: Results for orders placed or performed during the hospital encounter of 12/22/21  Resp Panel by RT-PCR (Flu A&B, Covid) Nasopharyngeal Swab     Status: None   Collection Time: 12/22/21 11:18 PM   Specimen: Nasopharyngeal Swab; Nasopharyngeal(NP) swabs in vial transport medium  Result Value Ref Range Status   SARS Coronavirus 2 by RT PCR NEGATIVE NEGATIVE Final    Comment: (NOTE) SARS-CoV-2 target nucleic acids are NOT DETECTED.  The SARS-CoV-2 RNA is generally detectable in upper respiratory specimens during the acute phase of infection. The lowest concentration of SARS-CoV-2 viral copies this assay can detect is 138 copies/mL.  A negative result does not preclude SARS-Cov-2 infection and should not be used as the sole basis for treatment or other patient management decisions. A negative result may occur with  improper specimen collection/handling, submission of specimen other than nasopharyngeal swab, presence of viral mutation(s) within the areas targeted by this assay, and inadequate number of viral copies(<138 copies/mL). A negative result must be combined with clinical observations, patient history, and epidemiological information. The expected result is Negative.  Fact Sheet for Patients:  EntrepreneurPulse.com.au  Fact Sheet for Healthcare Providers:  IncredibleEmployment.be  This test is no t yet approved or cleared by the [place] FDA and  has been authorized for detection and/or diagnosis of SARS-CoV-2 by FDA under an Emergency Use Authorization (EUA). This EUA will remain  in effect (meaning this test can be used) for the duration of the COVID-19 declaration under Section 564(b)(1) of the Act, 21 U.S.C.section 360bbb-3(b)(1), unless the authorization is terminated  or revoked sooner.       Influenza A by PCR NEGATIVE NEGATIVE Final   Influenza B by PCR NEGATIVE NEGATIVE Final    Comment: (NOTE) The Xpert Xpress SARS-CoV-2/FLU/RSV plus assay is intended as an aid in the diagnosis of influenza from Nasopharyngeal swab specimens and should not be used as a sole basis for treatment. Nasal washings and aspirates are unacceptable for Xpert Xpress SARS-CoV-2/FLU/RSV testing.  Fact Sheet for Patients: EntrepreneurPulse.com.au  Fact Sheet for Healthcare Providers: IncredibleEmployment.be  This test is not yet approved or cleared by the [place] FDA and has been authorized for detection and/or diagnosis of SARS-CoV-2 by FDA under an Emergency Use Authorization (EUA). This EUA will remain in effect (meaning this test  can be used) for the duration of the COVID-19 declaration under Section 564(b)(1) of the Act, 21 U.S.C. section 360bbb-3(b)(1), unless the authorization is terminated or revoked.  Performed at [facility], [address] [place], [place] [address]     Coagulation Studies: No results for input(s): LABPROT, INR in the last 72 hours.  Urinalysis: No results for input(s): COLORURINE, LABSPEC, PHURINE, GLUCOSEU, HGBUR, BILIRUBINUR, KETONESUR, PROTEINUR, UROBILINOGEN, NITRITE, LEUKOCYTESUR in the last 72 hours.  Invalid input(s): APPERANCEUR     Imaging: PERIPHERAL VASCULAR CATHETERIZATION  Result Date: 12/27/2021 See surgical note for result.    Medications:      amLODipine  10 mg Oral Daily   ARIPiprazole  1 mg Oral Daily   carvedilol  25 mg Oral BID WC   Chlorhexidine Gluconate Cloth  6 each Topical Q0600   heparin sodium (porcine)       ondansetron       pantoprazole  40 mg Oral BID   rosuvastatin  10 mg Oral Daily   sertraline  100 mg  Oral Daily   sodium bicarbonate  650 mg Oral TID   tamsulosin  0.4 mg Oral Daily   acetaminophen **OR** acetaminophen, hydrALAZINE, ondansetron **OR** ondansetron (ZOFRAN) IV, sorbitol, milk of mag, mineral oil, glycerin (SMOG) enema  Assessment/ Plan:  61 y.o. male with a past medical history significant for dementia, hypertension, heart failure, CKD 3a and CVA admitted to [facility] on 12/22/2021 with intractable vomiting.  Also noted to have mild leukocytosis and AKI.   #1 AKI on CKD 3a with baseline creatinine of 1.4 and EGFR of 52.  Patient's latest BUN/creatinine are 50 and 6.94 respectively.  It is felt that acute kidney injury could have been contributed from usage of Advil/NSAIDs and from dehydration secondary to intractable vomiting.   Patient received third dialysis treatment today.  Patient received 1-1/2 hours of scheduled dialysis treatment, treatment terminated early due to malfunctioning temp cath.  Due to lack of  sustainable renal recovery and urine output, we feel dialysis may be needed at discharge.  I have consulted vascular surgery for placement of PermCath, this should occur tomorrow.  We will resume dialysis treatment tomorrow after placement.  Dialysis coordinator aware of outpatient needs in clinic search in progress.  #2 recurrent vomiting likely multifactorial including advancing dementia ,medication side effect or functional dyspepsia.  GI is consulted.  EGD completed with no signs of active bleeding.  Did see gastritis, small hiatal hernia and LA grade B reflux esophagitis.  Concerning areas biopsied.  No further reports of nausea and vomiting, currently tolerating oral intake  3. Anemia of chronic kidney disease  Normocytic Lab Results  Component Value Date   HGB 12.7 (L) 12/27/2021  Hemoglobin at goal   LOS: 5   2/23/202312:45 PM

## 2021-12-29 NOTE — Progress Notes (Signed)
Progress Note   Patient: Randall [name] DOB: November 08, 1960 DOA: 12/22/2021     5 DOS: the patient was seen and examined on 12/29/2021   Brief hospital course: Taken from prior notes.  Randall [name] is a 61 y.o. male with a PMH significant for dementia, HTN, HFpEF, CKD 3a, CVA. They presented from home to the ED on 12/22/2021 with intractable vomiting which has been ongoing for weeks. He was recently admitted on 2/3 with similar symptoms and had a mostly unremarkable work up. In the ED, it was found that they had normal vital signs, mild leukocytosis, AKI. CT abdomen showed constipation. Otherwise unremarkable labs.  They were treated with supportive care including IV fluids and antiemetics.   Due to persistent nausea and vomiting, GI was consulted and patient underwent EGD yesterday on 12/26/2021 which shows esophagitis, gastritis and few nonbleeding erosions involving first part of duodenum.  Biopsies were taken.  Due to worsening renal function nephrology decided to start dialysis, HD catheter was placed by vascular surgery and patient will receive his first session today.  2/22.  Patient receives second session of dialysis today.  Most likely will need permanent HD catheter and outpatient dialysis chair before discharge. Remained oliguric.  2/23: Patient was seen after the third session of dialysis today.  He appears little more confused than his baseline.  He was slow and able to just tell me his name.  Permanent catheter will be placed tomorrow by vascular surgery.  Nephrology started the procedure for outpatient dialysis chair.   Assessment and Plan: * Recurrent vomiting- (present on admission) Improved. Recurrent episodes with second hospitalization in 2 weeks for the same No definite etiology identified. GI was consulted and patient underwent EGD on 12/26/2020 which shows esophagitis, some esophageal dysmotility, gastritis and few nonbleeding erosions in  duodenum, biopsies were taken. -Continue with Protonix -Nutritionist evaluation   Acute renal failure superimposed on stage 3a chronic kidney disease ([place]) Worsening renal function despite gentle IV hydration. Nephrology was consulted and they decided to proceed with hemodialysis. Temporary HD catheter placed by vascular surgery and patient received third session of dialysis today. Patient remained oliguric with not much improvement in renal function.  Permanent HD catheter will be placed tomorrow, nephrology started working on outpatient dialysis chair.  Most likely ESRD now -Monitor renal function -Avoid nephrotoxins  Chronic systolic CHF (congestive heart failure) ([place])- (present on admission) Currently euvolemic -Continue Coreg, Carvedilol  -Holding Entresto due to AKI  Dementia ([place])- (present on admission) Continue Namenda and sertraline  History of CVA (cerebrovascular accident) Continue aspirin and statin  Moderate major depression ([place]) - Continue Abilify and Zoloft  Leukocytosis Likely secondary to vomiting Acute infection not suspected  Subjective: Patient was seen immediately after completing the third session of dialysis.  Unable to complete the whole session due to some malfunctioning femoral catheter.  He appears little more confused than his baseline and able to tell his name only.  Denies any pain or shortness of breath.  Vital signs stable.  Physical Exam: Vitals:   12/29/21 1115 12/29/21 1128 12/29/21 1145 12/29/21 1306  BP: (!) 141/68 106/61 109/61 124/64  Pulse: 82 81 83 72  Resp: 20 18 17 18   Temp:    98.7 F (37.1 C)  TempSrc:    Oral  SpO2:  94%  97%  Weight:  79.5 kg    Height:       General.  Lethargic gentleman, in no acute distress. Pulmonary.  Lungs clear bilaterally, normal  respiratory effort. CV.  Regular rate and rhythm, no JVD, rub or murmur. Abdomen.  Soft, nontender, nondistended, BS positive. CNS.  Alert and oriented to name only.   No focal neurologic deficit. Extremities.  No edema, no cyanosis, pulses intact and symmetrical. Psychiatry.  Judgment and insight appears impaired.  Data Reviewed: I reviewed prior notes and labs.  Family Communication:   Disposition: Status is: Inpatient Remains inpatient appropriate because: Severity of illness.  Patient will need outpatient dialysis chair before discharge.  Planned Discharge Destination: Home with Home Health   Time spent: 40 minutes  This record has been created using Systems analyst. Errors have been sought and corrected,but may not always be located. Such creation errors do not reflect on the standard of care.  Author: Lorella Nimrod, MD 12/29/2021 1:54 PM  For on call review [email or web address].

## 2021-12-30 ENCOUNTER — Inpatient Hospital Stay
Admission: EM | Disposition: A | Discharge status: Home Health | Payer: Self-pay | Source: Home / Self Care | Attending: Internal Medicine

## 2021-12-30 ENCOUNTER — Encounter: Payer: Self-pay | Admitting: Vascular Surgery

## 2021-12-30 DIAGNOSIS — Z992 Dependence on renal dialysis: Secondary | ICD-10-CM | POA: Diagnosis not present

## 2021-12-30 DIAGNOSIS — R1110 Vomiting, unspecified: Secondary | ICD-10-CM | POA: Diagnosis not present

## 2021-12-30 DIAGNOSIS — N186 End stage renal disease: Secondary | ICD-10-CM | POA: Diagnosis not present

## 2021-12-30 HISTORY — PX: DIALYSIS/PERMA CATHETER INSERTION: CATH118288

## 2021-12-30 LAB — RENAL FUNCTION PANEL
Albumin: 2.7 g/dL — ABNORMAL LOW (ref 3.5–5.0)
Anion gap: 9 (ref 5–15)
BUN: 33 mg/dL — ABNORMAL HIGH (ref 6–20)
CO2: 25 mmol/L (ref 22–32)
Calcium: 7.8 mg/dL — ABNORMAL LOW (ref 8.9–10.3)
Chloride: 103 mmol/L (ref 98–111)
Creatinine, Ser: 7.33 mg/dL — ABNORMAL HIGH (ref 0.61–1.24)
GFR, Estimated: 8 mL/min — ABNORMAL LOW (ref >60)
Glucose, Bld: 94 mg/dL (ref 70–99)
Phosphorus: 3.7 mg/dL (ref 2.5–4.6)
Potassium: 3.2 mmol/L — ABNORMAL LOW (ref 3.5–5.1)
Sodium: 137 mmol/L (ref 135–145)

## 2021-12-30 LAB — HEPATITIS B SURFACE ANTIBODY, QUANTITATIVE: Hep B S AB Quant (Post): <3.1 m[IU]/mL — ABNORMAL LOW (ref >9.9)

## 2021-12-30 SURGERY — DIALYSIS/PERMA CATHETER INSERTION
Anesthesia: Moderate Sedation

## 2021-12-30 MED ORDER — CLINDAMYCIN PHOSPHATE 300 MG/50ML IV SOLN: 300.0000 mg | Freq: Once | INTRAVENOUS | Status: AC

## 2021-12-30 MED ORDER — MIDAZOLAM HCL 2 MG/2ML IJ SOLN
INTRAMUSCULAR | Status: AC
  Filled 2021-12-30: qty 4

## 2021-12-30 MED ORDER — ALTEPLASE 2 MG IJ SOLR
INTRAMUSCULAR | Status: AC
  Administered 2021-12-30: 4 mg
  Filled 2021-12-30: qty 4

## 2021-12-30 MED ORDER — CLINDAMYCIN PHOSPHATE 300 MG/50ML IV SOLN
INTRAVENOUS | Status: AC
Start: 2021-12-30 — End: 2021-12-30
  Administered 2021-12-30: 300 mg via INTRAVENOUS
  Filled 2021-12-30: qty 50

## 2021-12-30 MED ORDER — FAMOTIDINE 20 MG PO TABS: 40.0000 mg | ORAL_TABLET | Freq: Once | ORAL | Status: DC | PRN

## 2021-12-30 MED ORDER — METHYLPREDNISOLONE SODIUM SUCC 125 MG IJ SOLR: 125.0000 mg | Freq: Once | INTRAMUSCULAR | Status: DC | PRN

## 2021-12-30 MED ORDER — DIPHENHYDRAMINE HCL 50 MG/ML IJ SOLN: 50.0000 mg | Freq: Once | INTRAMUSCULAR | Status: DC | PRN

## 2021-12-30 MED ORDER — ACETAMINOPHEN 325 MG PO TABS
ORAL_TABLET | ORAL | Status: AC
  Filled 2021-12-30: qty 2

## 2021-12-30 MED ORDER — ONDANSETRON HCL 4 MG/2ML IJ SOLN
4.0000 mg | Freq: Four times a day (QID) | INTRAMUSCULAR | Status: DC | PRN
Start: 2021-12-30 — End: 2022-01-17

## 2021-12-30 MED ORDER — ASPIRIN EC 81 MG PO TBEC
81.0000 mg | DELAYED_RELEASE_TABLET | Freq: Every day | ORAL | Status: DC
  Administered 2021-12-31 – 2022-01-17 (×18): 81 mg via ORAL
  Filled 2021-12-30 (×21): qty 1

## 2021-12-30 MED ORDER — FENTANYL CITRATE PF 50 MCG/ML IJ SOSY
PREFILLED_SYRINGE | INTRAMUSCULAR | Status: AC
  Filled 2021-12-30: qty 2

## 2021-12-30 MED ORDER — MIDAZOLAM HCL 2 MG/ML PO SYRP: 8.0000 mg | ORAL_SOLUTION | Freq: Once | ORAL | Status: DC | PRN

## 2021-12-30 MED ORDER — SACUBITRIL-VALSARTAN 97-103 MG PO TABS
1.0000 | ORAL_TABLET | Freq: Two times a day (BID) | ORAL | Status: DC
  Administered 2021-12-30 – 2022-01-03 (×7): 1 via ORAL
  Filled 2021-12-30 (×9): qty 1

## 2021-12-30 MED ORDER — MEMANTINE HCL 5 MG PO TABS
5.0000 mg | ORAL_TABLET | Freq: Every day | ORAL | Status: DC
Start: 2021-12-30 — End: 2021-12-31
  Filled 2021-12-30: qty 1

## 2021-12-30 MED ORDER — FENTANYL CITRATE (PF) 100 MCG/2ML IJ SOLN
INTRAMUSCULAR | Status: DC | PRN
  Administered 2021-12-30: 50 ug via INTRAVENOUS

## 2021-12-30 MED ORDER — STERILE WATER FOR INJECTION IJ SOLN
INTRAMUSCULAR | Status: AC
  Filled 2021-12-30: qty 10

## 2021-12-30 MED ORDER — MIDAZOLAM HCL 2 MG/2ML IJ SOLN
INTRAMUSCULAR | Status: DC | PRN
Start: 2021-12-30 — End: 2021-12-30
  Administered 2021-12-30: 2 mg via INTRAVENOUS

## 2021-12-30 MED ORDER — HYDROMORPHONE HCL 1 MG/ML IJ SOLN: 1.0000 mg | Freq: Once | INTRAMUSCULAR | Status: DC | PRN

## 2021-12-30 MED ORDER — HEPARIN SODIUM (PORCINE) 1000 UNIT/ML IJ SOLN
INTRAMUSCULAR | Status: AC
  Administered 2021-12-30: 4200 [IU]
  Filled 2021-12-30: qty 10

## 2021-12-30 MED ORDER — SODIUM CHLORIDE 0.9 % IV SOLN: INTRAVENOUS | Status: DC

## 2021-12-30 SURGICAL SUPPLY — 9 items
BIOPATCH RED 1 DISK 7.0 (GAUZE/BANDAGES/DRESSINGS) ×1 IMPLANT
CATH CANNON HEMO 15FR 19 (HEMODIALYSIS SUPPLIES) ×1 IMPLANT
COVER PROBE U/S 5X48 (MISCELLANEOUS) ×1 IMPLANT
DERMABOND ADVANCED (GAUZE/BANDAGES/DRESSINGS) ×1
DERMABOND ADVANCED .7 DNX12 (GAUZE/BANDAGES/DRESSINGS) IMPLANT
PACK ANGIOGRAPHY (CUSTOM PROCEDURE TRAY) ×1 IMPLANT
SUT MNCRL AB 4-0 PS2 18 (SUTURE) ×1 IMPLANT
SUT PROLENE 0 CT 1 30 (SUTURE) ×1 IMPLANT
TOWEL OR 17X26 4PK STRL BLUE (TOWEL DISPOSABLE) ×1 IMPLANT

## 2021-12-30 NOTE — Progress Notes (Addendum)
Physical Therapy Treatment Patient Details Name: Randall [name] MRN: [id number] DOB: Mar 07, 1961 Today's Date: 12/30/2021   History of Present Illness Randall [name] is a 61 y.o. male seen for evaluation of recurrent vomiting at the request of Dr. Judd Gaudier. Patient has a PMH of dementia, hypertension, diabetes, chronic kidney disease, CHF.  Patient presented to the [facility] ED for chief complaint of nausea, vomiting and weakness. Upon presentation to the ED yesterday, vital signs were stable with blood pressure 120/72, heart rate 67, respirations 18, temperature 98.7, pulse oximetry 97%. Labs were significant for new AKI with Cr 3.5, normal electrolytes GFR 19, glucose 176, WBC 17.9, UA unremarkable.  Imaging studies revealed CT of the abdomen pelvis revealed a possible tiny hiatal hernia, constipation, otherwise no acute intra-abdominal or intrapelvic abnormality.  Aortic atherosclerosis.  He is being admitted for IV hydration.  GI is consulted for evaluation and management of recurrent vomiting.    PT Comments    Pt awake and alert sitting EOB eating lunch and talking on phone upon PT entrance into room. Pt was very pleasant and smiling during conversations and laughed when I told him he needed to let the person on the phone know he had to hang up because his "favorite person was in the room now". Pt cleared by nephrology NP for short distance ambulation until temp cath removed. Due to fem temp cath mobility parameters were kept to Pt being able to perform 5x sit to stand w/ SUPERVISION for 2 bouts with a ~45sec rest break in-between bouts. Pt was left sitting EOB eating lunch, with transportation entering to take patient to dialysis this afternoon. Pt will benefit from continued skilled PT in order to improve LE strength, mobility, gait, and restore PLOF. Current discharge recommendation remains appropriate due to the level of assistance required by the patient to ensure safety and improve  overall function.   Recommendations for follow up therapy are one component of a multi-disciplinary discharge planning process, led by the attending physician.  Recommendations may be updated based on patient status, additional functional criteria and insurance authorization.  Follow Up Recommendations  Home health PT     Assistance Recommended at Discharge Frequent or constant Supervision/Assistance  Patient can return home with the following A little help with walking and/or transfers;A little help with bathing/dressing/bathroom;Help with stairs or ramp for entrance;Assist for transportation;Direct supervision/assist for financial management;Direct supervision/assist for medications management   Equipment Recommendations  Rolling walker (2 wheels)    Recommendations for Other Services       Precautions / Restrictions Precautions Precautions: Fall Restrictions Weight Bearing Restrictions: No Other Position/Activity Restrictions: cleared by nephrology NP for short distance ambulation until temp cath removed     Mobility  Bed Mobility                    Transfers Overall transfer level: Needs assistance   Transfers: Sit to/from Stand Sit to Stand: Supervision                Ambulation/Gait               General Gait Details: cleared by nephrology NP for short distance ambulation until temp cath removed   Stairs             Wheelchair Mobility    Modified Rankin (Stroke Patients Only)       Balance Overall balance assessment: Needs assistance Sitting-balance support: No upper extremity supported, Feet supported Sitting balance-Leahy Scale: Good  Standing balance support: No upper extremity supported Standing balance-Leahy Scale: Good                              Cognition Arousal/Alertness: Awake/alert Behavior During Therapy: WFL for tasks assessed/performed Overall Cognitive Status: Within Functional Limits for  tasks assessed                                 General Comments: very pleasant today; sitting EOB eating lunch and talking on phone.        Exercises Other Exercises Other Exercises: 5x sit to stand (2 sets)    General Comments        Pertinent Vitals/Pain Pain Assessment Pain Assessment: No/denies pain    Home Living                          Prior Function            PT Goals (current goals can now be found in the care plan section) Progress towards PT goals: Progressing toward goals    Frequency    Min 2X/week      PT Plan Current plan remains appropriate    Co-evaluation              AM-PAC PT "6 Clicks" Mobility   Outcome Measure  Help needed turning from your back to your side while in a flat bed without using bedrails?: A Little Help needed moving from lying on your back to sitting on the side of a flat bed without using bedrails?: A Little Help needed moving to and from a bed to a chair (including a wheelchair)?: A Little Help needed standing up from a chair using your arms (e.g., wheelchair or bedside chair)?: A Little Help needed to walk in hospital room?: A Little Help needed climbing 3-5 steps with a railing? : A Little 6 Click Score: 18    End of Session   Activity Tolerance: Patient tolerated treatment well Patient left: with family/visitor present;with chair alarm set;with call bell/phone within reach;in bed Nurse Communication: Mobility status PT Visit Diagnosis: Unsteadiness on feet (R26.81);Muscle weakness (generalized) (M62.81);Difficulty in walking, not elsewhere classified (R26.2)     Time: 9924-2683 PT Time Calculation (min) (ACUTE ONLY): 8 min  Charges:                         Randall [name], SPT 12/30/2021, 4:17 PM

## 2021-12-30 NOTE — Progress Notes (Signed)
Progress Note   Patient: Randall [name] LFY:[id number] DOB: 30-Oct-1961 DOA: 12/22/2021     6 DOS: the patient was seen and examined on 12/30/2021   Brief hospital course: Taken from prior notes.  Randall [name] is a 61 y.o. male with a PMH significant for dementia, HTN, HFpEF, CKD 3a, CVA. They presented from home to the ED on 12/22/2021 with intractable vomiting which has been ongoing for weeks. He was recently admitted on 2/3 with similar symptoms and had a mostly unremarkable work up. In the ED, it was found that they had normal vital signs, mild leukocytosis, AKI. CT abdomen showed constipation. Otherwise unremarkable labs.  They were treated with supportive care including IV fluids and antiemetics.   Due to persistent nausea and vomiting, GI was consulted and patient underwent EGD yesterday on 12/26/2021 which shows esophagitis, gastritis and few nonbleeding erosions involving first part of duodenum.  Biopsies were taken.  Due to worsening renal function nephrology decided to start dialysis, HD catheter was placed by vascular surgery and patient will receive his first session today.  2/22.  Patient receives second session of dialysis today.  Most likely will need permanent HD catheter and outpatient dialysis chair before discharge. Remained oliguric.  2/23: Patient was seen after the third session of dialysis today.  He appears little more confused than his baseline.  He was slow and able to just tell me his name.  Permanent catheter will be placed tomorrow by vascular surgery.  Nephrology started the procedure for outpatient dialysis chair.  2/24: Permanent catheter was placed by vascular surgery earlier today.  Tolerated the procedure well.  Plan is for another session of dialysis later today.  Waiting for outpatient dialysis chair. PT is recommending home health.   Assessment and Plan: * Recurrent vomiting- (present on admission) Improved. Recurrent episodes with second  hospitalization in 2 weeks for the same No definite etiology identified. GI was consulted and patient underwent EGD on 12/26/2020 which shows esophagitis, some esophageal dysmotility, gastritis and few nonbleeding erosions in duodenum, biopsies were taken. -Continue with Protonix -Nutritionist evaluation   Acute renal failure superimposed on stage 3a chronic kidney disease ([place]) Worsening renal function despite gentle IV hydration. Nephrology was consulted and they decided to proceed with hemodialysis. Patient received permanent HD catheter today.  Received 3 sessions of initial dialysis with temporary catheter which was removed today. Patient remained oliguric with not much improvement in renal function.   nephrology started working on outpatient dialysis chair.  Most likely ESRD now -Monitor renal function -Avoid nephrotoxins  Chronic systolic CHF (congestive heart failure) ([place])- (present on admission) Currently euvolemic -Continue Coreg, Carvedilol  -We will restart Entresto as patient is most likely ESRD now  Dementia ([place])- (present on admission) Continue Namenda and sertraline  History of CVA (cerebrovascular accident) Continue aspirin and statin  Moderate major depression ([place]) - Continue Abilify and Zoloft  Leukocytosis Likely secondary to vomiting Acute infection not suspected   Subjective: Patient was sitting comfortably on the side of bed and eating lunch when seen today.  Sister at bedside.  Permanent HD catheter placed on right chest.  Denies any pain.  Physical Exam: Vitals:   12/30/21 1455 12/30/21 1515 12/30/21 1530 12/30/21 1545  BP:  (!) 196/79 (!) 192/82 (!) 166/72  Pulse:  69 69 70  Resp:  14 (!) 9 11  Temp: 98.8 F (37.1 C)     TempSrc: Oral     SpO2:  100% 100% 100%  Weight: 78.8 kg  Height:       General.  Chronically ill-appearing gentleman, in no acute distress. Pulmonary.  Lungs clear bilaterally, normal respiratory effort. CV.  Regular  rate and rhythm, no JVD, rub or murmur. Abdomen.  Soft, nontender, nondistended, BS positive. CNS.  Alert and oriented .  No focal neurologic deficit. Extremities.  No edema, no cyanosis, pulses intact and symmetrical. Psychiatry.  Judgment and insight appears normal.  Data Reviewed: I reviewed prior notes and labs.  Family Communication: Discussed with sister at bedside  Disposition: Status is: Inpatient Remains inpatient appropriate because: Severity of illness.  Awaiting outpatient dialysis chair   Planned Discharge Destination: Home with Home Health  Time spent: 40 minutes  This record has been created using Systems analyst. Errors have been sought and corrected,but may not always be located. Such creation errors do not reflect on the standard of care.  Author: Lorella Nimrod, MD 12/30/2021 3:48 PM  For on call review [email or web address].

## 2021-12-30 NOTE — Assessment & Plan Note (Signed)
Worsening renal function despite gentle IV hydration. Nephrology was consulted and they decided to proceed with hemodialysis. Patient received permanent HD catheter today.  Received 3 sessions of initial dialysis with temporary catheter which was removed today. Patient remained oliguric with not much improvement in renal function.   nephrology started working on outpatient dialysis chair.  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

## 2021-12-30 NOTE — Progress Notes (Signed)
Mobility Specialist - Progress Note   12/30/21 1400  Mobility  Activity Off unit     Pt off unit for HD tx per discussion with RN. Will attempt session another date/time.    Kathee Delton Mobility Specialist 12/30/21, 2:55 PM

## 2021-12-30 NOTE — Assessment & Plan Note (Signed)
Continue Namenda and sertraline

## 2021-12-30 NOTE — Op Note (Signed)
OPERATIVE NOTE    PRE-OPERATIVE DIAGNOSIS: 1. ESRD   POST-OPERATIVE DIAGNOSIS: same as above  PROCEDURE: Ultrasound guidance for vascular access to the right internal jugular vein Fluoroscopic guidance for placement of catheter Placement of a 19 cm tip to cuff tunneled hemodialysis catheter via the right internal jugular vein  SURGEON: [name], MD  ANESTHESIA:  Local with Moderate conscious sedation for approximately 19 minutes using 2 mg of Versed and 50 mcg of Fentanyl  ESTIMATED BLOOD LOSS: 3 cc  FLUORO TIME: less than one minute  CONTRAST: none  FINDING(S): 1.  Patent right internal jugular vein  SPECIMEN(S):  None  INDICATIONS:   Randall [name] is a 61 y.o.male who presents with renal failure.  The patient needs long term dialysis access for their ESRD, and a Permcath is necessary.  Risks and benefits are discussed and informed consent is obtained.    DESCRIPTION: After obtaining full informed written consent, the patient was brought back to the vascular suited. The patient's right neck and chest were sterilely prepped and draped in a sterile surgical field was created. Moderate conscious sedation was administered during a face to face encounter with the patient throughout the procedure with my supervision of the RN administering medicines and monitoring the patient's vital signs, pulse oximetry, telemetry and mental status throughout from the start of the procedure until the patient was taken to the recovery room.  The right internal jugular vein was visualized with ultrasound and found to be patent. It was then accessed under direct ultrasound guidance and a permanent image was recorded. A wire was placed. After skin nick and dilatation, the peel-away sheath was placed over the wire. I then turned my attention to an area under the clavicle. Approximately 1-2 fingerbreadths below the clavicle a small counterincision was created and tunneled from the subclavicular  incision to the access site. Using fluoroscopic guidance, a 19 centimeter tip to cuff tunneled hemodialysis catheter was selected, and tunneled from the subclavicular incision to the access site. It was then placed through the peel-away sheath and the peel-away sheath was removed. Using fluoroscopic guidance the catheter tips were parked in the right atrium. The appropriate distal connectors were placed. It withdrew blood well and flushed easily with heparinized saline and a concentrated heparin solution was then placed. It was secured to the chest wall with 2 Prolene sutures. The access incision was closed single 4-0 Monocryl. A 4-0 Monocryl pursestring suture was placed around the exit site. Sterile dressings were placed. The patient tolerated the procedure well and was taken to the recovery room in stable condition.  COMPLICATIONS: None  CONDITION: Stable  [name], MD 12/30/2021 9:10 AM   This note was created with Dragon Medical transcription system. Any errors in dictation are purely unintentional.

## 2021-12-30 NOTE — Progress Notes (Signed)
[facility]  ROUNDING NOTE   Subjective:  Pt is admitted to [facility] on 12/22/2021 with intractable vomiting on 12/22/2021 with intractable vomiting which has been going for weeks.  He noted to have mild leukocytosis and AKI on admission patient has a past medical history significant for dementia, hypertension, HFpEF, CKD 3a and CVA.  We were asked to see the patient for AKI with a significantly elevated BUN and creatinine  Patient seen after PermCath procedure Remains drowsy from sedation Denies pain and discomfort at this time  Dialysis scheduled for later today  Objective:  Vital signs in last 24 hours:  Temp:  [98 F (36.7 C)-98.9 F (37.2 C)] 98.8 F (37.1 C) (02/24 0804) Pulse Rate:  [0-85] 84 (02/24 0945) Resp:  [5-20] 14 (02/24 0945) BP: (106-182)/(58-86) 167/86 (02/24 0945) SpO2:  [94 %-100 %] 100 % (02/24 0945) Weight:  [79.5 kg] 79.5 kg (02/24 0804)  Weight change: 0 kg Filed Weights   12/29/21 0949 12/29/21 1128 12/30/21 0804  Weight: 79.6 kg 79.5 kg 79.5 kg    Intake/Output: I/O last 3 completed shifts: In: 240 [P.O.:240] Out: 492 [Urine:300; Other:192]   Intake/Output this shift:  No intake/output data recorded.  Physical Exam: General: NAD, lethargic  Head: Normocephalic, atraumatic. Moist oral mucosal membranes  Eyes: Anicteric  Lungs:  Clear to auscultation, normal effort  Heart: S1S2 no rubs  Abdomen:  Soft, nontender, bowel sounds present  Extremities:  Trace peripheral edema.  Neurologic: Lethargic following simple commands  Skin: No acute rash  Access: Right femoral HD temp cath, Rt IJ permcath placed by Dr Lucky Cowboy on 16/10/96    Basic Metabolic Panel: Recent Labs  Lab 12/26/21 0535 12/27/21 0409 12/28/21 0421 12/29/21 0445 12/30/21 0439  NA 136 139 139 138 137  K 3.9 3.9 3.4* 3.2* 3.2*  CL 109 113* 106 102 103  CO2 18* 15* 21* 25 25  GLUCOSE 73 88 99 107* 94  BUN 48* 57* 46* 33* 33*  CREATININE 7.78* 8.48* 7.45* 6.94* 7.33*   CALCIUM 8.1* 8.0* 8.1* 7.9* 7.8*  PHOS  --  4.7* 4.1 3.0 3.7     Liver Function Tests: Recent Labs  Lab 12/23/21 1126 12/27/21 0409 12/28/21 0421 12/29/21 0445 12/30/21 0439  AST 16  --   --   --   --   ALT 12  --   --   --   --   ALKPHOS 58  --   --   --   --   BILITOT 0.5  --   --   --   --   PROT 5.8*  --   --   --   --   ALBUMIN 2.9* 2.7* 2.6* 2.7* 2.7*    No results for input(s): LIPASE, AMYLASE in the last 168 hours.  No results for input(s): AMMONIA in the last 168 hours.  CBC: Recent Labs  Lab 12/23/21 1126 12/24/21 1532 12/25/21 0529 12/27/21 0409  WBC 13.5* 11.0* 10.4 9.9  HGB 11.5* 11.4* 11.0* 12.7*  HCT 34.5* 33.7* 32.7* 37.3*  MCV 95.8 94.4 94.0 93.0  PLT 201 199 197 209     Cardiac Enzymes: No results for input(s): CKTOTAL, CKMB, CKMBINDEX, TROPONINI in the last 168 hours.  BNP: Invalid input(s): POCBNP  CBG: Recent Labs  Lab 12/26/21 1314 12/26/21 1402 12/26/21 1442 12/29/21 0421  GLUCAP 62* 97 108* 106*     Microbiology: Results for orders placed or performed during the hospital encounter of 12/22/21  Resp Panel by  RT-PCR (Flu A&B, Covid) Nasopharyngeal Swab     Status: None   Collection Time: 12/22/21 11:18 PM   Specimen: Nasopharyngeal Swab; Nasopharyngeal(NP) swabs in vial transport medium  Result Value Ref Range Status   SARS Coronavirus 2 by RT PCR NEGATIVE NEGATIVE Final    Comment: (NOTE) SARS-CoV-2 target nucleic acids are NOT DETECTED.  The SARS-CoV-2 RNA is generally detectable in upper respiratory specimens during the acute phase of infection. The lowest concentration of SARS-CoV-2 viral copies this assay can detect is 138 copies/mL. A negative result does not preclude SARS-Cov-2 infection and should not be used as the sole basis for treatment or other patient management decisions. A negative result may occur with  improper specimen collection/handling, submission of specimen other than nasopharyngeal swab,  presence of viral mutation(s) within the areas targeted by this assay, and inadequate number of viral copies(<138 copies/mL). A negative result must be combined with clinical observations, patient history, and epidemiological information. The expected result is Negative.  Fact Sheet for Patients:  EntrepreneurPulse.com.au  Fact Sheet for Healthcare Providers:  IncredibleEmployment.be  This test is no t yet approved or cleared by the [place] FDA and  has been authorized for detection and/or diagnosis of SARS-CoV-2 by FDA under an Emergency Use Authorization (EUA). This EUA will remain  in effect (meaning this test can be used) for the duration of the COVID-19 declaration under Section 564(b)(1) of the Act, 21 U.S.C.section 360bbb-3(b)(1), unless the authorization is terminated  or revoked sooner.       Influenza A by PCR NEGATIVE NEGATIVE Final   Influenza B by PCR NEGATIVE NEGATIVE Final    Comment: (NOTE) The Xpert Xpress SARS-CoV-2/FLU/RSV plus assay is intended as an aid in the diagnosis of influenza from Nasopharyngeal swab specimens and should not be used as a sole basis for treatment. Nasal washings and aspirates are unacceptable for Xpert Xpress SARS-CoV-2/FLU/RSV testing.  Fact Sheet for Patients: EntrepreneurPulse.com.au  Fact Sheet for Healthcare Providers: IncredibleEmployment.be  This test is not yet approved or cleared by the [place] FDA and has been authorized for detection and/or diagnosis of SARS-CoV-2 by FDA under an Emergency Use Authorization (EUA). This EUA will remain in effect (meaning this test can be used) for the duration of the COVID-19 declaration under Section 564(b)(1) of the Act, 21 U.S.C. section 360bbb-3(b)(1), unless the authorization is terminated or revoked.  Performed at [facility], [address] [place], [place] [address]      Coagulation Studies: No results for input(s): LABPROT, INR in the last 72 hours.  Urinalysis: No results for input(s): COLORURINE, LABSPEC, PHURINE, GLUCOSEU, HGBUR, BILIRUBINUR, KETONESUR, PROTEINUR, UROBILINOGEN, NITRITE, LEUKOCYTESUR in the last 72 hours.  Invalid input(s): APPERANCEUR     Imaging: PERIPHERAL VASCULAR CATHETERIZATION  Result Date: 12/30/2021 See surgical note for result.    Medications:      amLODipine  10 mg Oral Daily   ARIPiprazole  1 mg Oral Daily   carvedilol  25 mg Oral BID WC   Chlorhexidine Gluconate Cloth  6 each Topical Q0600   diclofenac Sodium  2 g Topical QID   fentaNYL       midazolam       pantoprazole  40 mg Oral BID   rosuvastatin  10 mg Oral Daily   sertraline  100 mg Oral Daily   sodium bicarbonate  650 mg Oral TID   tamsulosin  0.4 mg Oral Daily   acetaminophen **OR** acetaminophen, hydrALAZINE, HYDROmorphone (DILAUDID) injection, ondansetron **OR** ondansetron (ZOFRAN) IV,  ondansetron (ZOFRAN) IV, sorbitol, milk of mag, mineral oil, glycerin (SMOG) enema  Assessment/ Plan:  61 y.o. male with a past medical history significant for dementia, hypertension, heart failure, CKD 3a and CVA admitted to [facility] on 12/22/2021 with intractable vomiting.  Also noted to have mild leukocytosis and AKI.   #1 AKI on CKD 3a with baseline creatinine of 1.4 and EGFR of 52.  Patient's latest BUN/creatinine are 50 and 6.94 respectively.  It is felt that acute kidney injury could have been contributed from usage of Advil/NSAIDs and from dehydration secondary to intractable vomiting.   Received third dialysis treatment yesterday, but terminated early due to elevated arterial pressures in temp cath. Appreciate vascular placing Rt permcath today. Will attempt dialysis treatment later today.  If dialysis successful through new access, will place order to discontinue HD temp cath Search in progress for outpatient clinic  #2 recurrent vomiting likely  multifactorial including advancing dementia ,medication side effect or functional dyspepsia.  GI is consulted.  EGD completed with no signs of active bleeding.  Did see gastritis, small hiatal hernia and LA grade B reflux esophagitis.  Concerning areas biopsied.    3. Anemia of chronic kidney disease  Normocytic Lab Results  Component Value Date   HGB 12.7 (L) 12/27/2021  Hgb within range   LOS: 6   2/24/202310:00 AM

## 2021-12-30 NOTE — Assessment & Plan Note (Signed)
Currently euvolemic -Continue Coreg, Carvedilol  -We will restart Entresto as patient is most likely ESRD now

## 2021-12-30 NOTE — Plan of Care (Signed)
  Problem: Activity: Goal: Risk for activity intolerance will decrease Outcome: Progressing   Problem: Coping: Goal: Level of anxiety will decrease Outcome: Progressing   Problem: Pain Managment: Goal: General experience of comfort will improve Outcome: Progressing   Problem: Safety: Goal: Ability to remain free from injury will improve Outcome: Progressing   Problem: Skin Integrity: Goal: Risk for impaired skin integrity will decrease Outcome: Progressing   

## 2021-12-30 NOTE — Evaluation (Addendum)
Clinical/Bedside Swallow Evaluation Patient Details  Name: Randall [name] MRN: [id number] Date of Birth: Dec 18, 1960  Today's Date: 12/30/2021 Time: SLP Start Time (ACUTE ONLY): 5638 SLP Stop Time (ACUTE ONLY): 1230 SLP Time Calculation (min) (ACUTE ONLY): 45 min  Past Medical History:  Past Medical History:  Diagnosis Date   Dementia ([place])    Diabetes mellitus without complication ([place])    Hypertension    Past Surgical History:  Past Surgical History:  Procedure Laterality Date   DIALYSIS/PERMA CATHETER INSERTION N/A 12/30/2021   Procedure: DIALYSIS/PERMA CATHETER INSERTION;  Surgeon: Algernon Huxley, MD;  Location: [place] [facility];  Service: Cardiovascular;  Laterality: N/A;   ESOPHAGOGASTRODUODENOSCOPY (EGD) WITH PROPOFOL N/A 12/26/2021   Procedure: ESOPHAGOGASTRODUODENOSCOPY (EGD) WITH PROPOFOL;  Surgeon: Annamaria Helling, DO;  Location: [facility] ENDOSCOPY;  Service: Gastroenterology;  Laterality: N/A;   TEMPORARY DIALYSIS CATHETER N/A 12/27/2021   Procedure: TEMPORARY DIALYSIS CATHETER;  Surgeon: Katha Cabal, MD;  Location: [place] [facility];  Service: Cardiovascular;  Laterality: N/A;   HPI:  Pt is a 61 y.o. male seen for evaluation of recurrent vomiting at the request of Dr. Judd Gaudier. Patient has a PMH:  Dementia, hypertension, diabetes, chronic kidney disease, CHF.  Patient presented to the [facility] ED for chief complaint of nausea, vomiting and weakness. Upon presentation to the ED yesterday, vital signs were stable with blood pressure 120/72, heart rate 67, respirations 18, temperature 98.7, pulse oximetry 97%. Labs were significant for new AKI with Cr 3.5, normal electrolytes GFR 19, glucose 176, WBC 17.9, UA unremarkable.  Imaging studies revealed CT of the abdomen pelvis revealed a possible tiny hiatal hernia, constipation, otherwise no acute intra-abdominal or intrapelvic abnormality.  Aortic atherosclerosis.  He is being admitted for IV hydration.  GI is consulted  for evaluation and management of recurrent vomiting.  Pt had recent admit ~10 days ago w/ acute confusion, UTI, emesis, and encephalopathy superimposed on Baseline Dementia.  CT of Abd: "Possible tiny hiatal hernia.  2. Constipation.  No acute abnormality of lower chest.".   Pt had a Permanent catheter placed by Vascular Surgery earlier this morning for ongoing need for HD. Pt returned from the procedure and attempted to eat breakfast meal. NSG noted pt was pocketing and orally holding foods which caused him to cough/choke. When he spit out the foods pocketed, he had an increased amount of thick, clear phlegm -- suspect could be related to Esophageal phlegm.    Assessment / Plan / Recommendation  Clinical Impression  Pt sat EOB. He was encouraged to sit up from laying across the bed sideways w/ his back to [facility] in room. PO trials of thin liquids and foods were prepped and placed on tray table for pt to feed himself. Pt fed himself consuming several trials of each consistency.   He appears to present w/ grossly adequate oropharyngeal phase swallow w/ No significant oropharyngeal phase dysphagia noted, No neuromuscular deficits noted. Pt consumed the trials w/ No overt, clinical s/s of aspiration during po trials. Pt appears at reduced risk for aspiration following general aspiration precautions. Also, pt is missing some lower Dentition(molars). He has an Upper Denture plate that requires adhesive for secure fit -- may need help/support w/ Denture care.   Suspect pt could have had Cognitive impact and/or Confusion w/ the po trials of the breakfast meal attempting it immediately post a Procedure -- he has a Baseline dx of Dementia and ANY Cognitive decline can be exacerbated by acute issues/illness.  During po trials, pt consumed all consistencies w/ no overt coughing, decline in vocal quality, or change in respiratory presentation during/post trials. Oral phase appeared grossly [facility] w/ timely bolus  management, mastication, and control of bolus propulsion for A-P transfer for swallowing. Mastication timing and effectiveness was adequate. Cautioned pt/Family on particulate foods such as salads at this time; not recommended. Oral clearing achieved w/ all trial consistencies w/ min time. OM Exam appeared [facility] w/ no unilateral weakness noted. Speech Clear w/ Tangential speech noted -- he moved from topic to topic. Pt was smiling and laughing more today He did not seem aware he was initially lying w/ his back to his family across the bed.   Recommend a Regular consistency diet w/ well-Cut meats, moistened foods; Thin liquids VIA CUP - pt does not use straws at home. Recommend general aspiration precautions, Pills CRUSHED vs WHOLE in Puree for safer, easier swallowing. Recommend reducing distractions during meals and supporting feeding as needed -- pt can hold own cup when drinking to improve safety of swallowing. Check for oral clearing as needed during meals -- monitor Cognitive status during ADLs. Recommend REFLUX precautions. Education given on Pills in Puree; food consistencies and easy to eat options; general aspiration precautions; denture care to the Family present. NSG to reconsult if any new needs arise. NSG agreed.   Pt should f/u w/ Neurology for formal Cognitive assessment and testing in light of Cognitive decline and dx of Dementia for Education w/ Family. MD updated.  SLP Visit Diagnosis: Dysphagia, unspecified (R13.10) (impact from Cognitive decline)    Aspiration Risk   (reduced following general aspiration precautions; supervision when needed)    Diet Recommendation   Regular consistency diet w/ well-Cut meats, moistened foods; Thin liquids VIA CUP - pt does not use straws at home. Recommend general aspiration precautions. Recommend reducing distractions during meals and supporting feeding as needed -- pt can hold own cup when drinking to improve safety of swallowing. Check for oral  clearing as needed during meals -- monitor Cognitive status during ADLs. Recommend REFLUX precautions.   Medication Administration: Whole meds with puree (vs need to Crush in puree)    Other  Recommendations Recommended Consults:  (f/u w/ Neurology for full work-up re: Cognitive status/decline) Oral Care Recommendations: Oral care BID;Oral care before and after PO;Staff/trained caregiver to provide oral care (denture care) Other Recommendations:  (n/a)    Recommendations for follow up therapy are one component of a multi-disciplinary discharge planning process, led by the attending physician.  Recommendations may be updated based on patient status, additional functional criteria and insurance authorization.  Follow up Recommendations No SLP follow up      Assistance Recommended at Discharge Intermittent Supervision/Assistance  Functional Status Assessment Patient has not had a recent decline in their functional status  Frequency and Duration  (n/a)   (n/a)       Prognosis Prognosis for Safe Diet Advancement: Fair (-Good based on Cognitive status and/or any N/V) Barriers to Reach Goals: Cognitive deficits;Time post onset;Severity of deficits;Behavior Barriers/Prognosis Comment: baseline Dementia per Chart      Swallow Study   General Date of Onset: 12/22/21 HPI: Pt is a 61 y.o. male seen for evaluation of recurrent vomiting at the request of Dr. Judd Gaudier. Patient has a PMH of dementia, hypertension, diabetes, chronic kidney disease, CHF.  Patient presented to the [facility] ED for chief complaint of nausea, vomiting and weakness. Upon presentation to the ED yesterday, vital signs were stable with blood pressure  120/72, heart rate 67, respirations 18, temperature 98.7, pulse oximetry 97%. Labs were significant for new AKI with Cr 3.5, normal electrolytes GFR 19, glucose 176, WBC 17.9, UA unremarkable.  Imaging studies revealed CT of the abdomen pelvis revealed a possible tiny hiatal hernia,  constipation, otherwise no acute intra-abdominal or intrapelvic abnormality.  Aortic atherosclerosis.  He is being admitted for IV hydration.  GI is consulted for evaluation and management of recurrent vomiting.  Pt had recent admit ~10 days ago w/ acute confusion, UTI, emesis, and encephalopathy superimposed on Baseline Dementia.  CT of Abd: "Possible tiny hiatal hernia.  2. Constipation.  No acute abnormality of lower chest.". Type of Study: Bedside Swallow Evaluation Previous Swallow Assessment: 12/10/2021; 12/23/2021 Diet Prior to this Study: Dysphagia 3 (soft);Thin liquids Temperature Spikes Noted: No Respiratory Status: Room air History of Recent Intubation: No Behavior/Cognition: Alert;Cooperative;Pleasant mood;Confused;Distractible;Requires cueing (Was lying sideways across the bed w/ his back to the family.) Oral Cavity Assessment: Within Functional Limits Oral Care Completed by SLP: Recent completion by staff Oral Cavity - Dentition: Dentures, top;Missing dentition (on bottom) Vision: Functional for self-feeding Self-Feeding Abilities: Able to feed self;Needs assist;Needs set up (full setup and cues) Patient Positioning: Upright in bed (EOB w/ cues) Baseline Vocal Quality: Normal;Low vocal intensity (adequate) Volitional Cough: Strong Volitional Swallow: Able to elicit    Oral/Motor/Sensory Function Overall Oral Motor/Sensory Function: Within functional limits   Ice Chips Ice chips: Within functional limits Presentation: Spoon (fed; 1 trial)   Thin Liquid Thin Liquid: Within functional limits Presentation: Cup;Self Fed (8-10 swallows total) Other Comments: water, soda    Nectar Thick Nectar Thick Liquid: Not tested   Honey Thick Honey Thick Liquid: Not tested   Puree Puree: Within functional limits Presentation: Self Fed;Spoon (5 trials)   Solid     Solid: Within functional limits Presentation: Self Fed (5 trials) Other Comments: better organized than at eval on 12/23/2021         Orinda Kenner, MS, [place] Speech Language Pathologist Re[facility]; [place] [phone] (ascom) Tamora Huneke 12/30/2021,3:51 PM

## 2021-12-30 NOTE — TOC Progression Note (Signed)
Transition of Care [facility]) - Progression Note    Patient Details  Name: Randall [name] MRN: [id number] Date of Birth: 05/13/1961  [facility] of [facility]) CM/SW Contact  Beverly Sessions, RN Phone Number: 12/30/2021, 2:17 PM  Clinical Narrative:     Outpatient HD placement pending.  Plan remains for [place] at discharge.  DME has been delivered to the room       Expected Discharge Plan and Services                           DME Arranged: 3-N-1, [place] rolling DME Agency: [employer] Date DME Agency Contacted: 12/24/21 Time DME Agency Contacted: [phone] Representative spoke with at DME Agency: [place] ([place]) Interventions    Readmission Risk Interventions No flowsheet data found.

## 2021-12-30 NOTE — Progress Notes (Signed)
OT Cancellation Note  Patient Details Name: Randall [name] MRN: [id number] DOB: 16-Sep-1961   Cancelled Treatment:    Reason Eval/Treat Not Completed: Patient at procedure or test/ unavailable Pt OTF for vascular procedure. Will f/u at later date/time as able for OT Tx. Thank you.  Gerrianne Scale, [place], OTR/L ascom [phone] 12/30/21, 8:08 AM

## 2021-12-30 NOTE — Interval H&P Note (Signed)
History and Physical Interval Note:  12/30/2021 8:12 AM  Randall [name]  has presented today for surgery, with the diagnosis of esrd.  The various methods of treatment have been discussed with the patient and family. After consideration of risks, benefits and other options for treatment, the patient has consented to  Procedure(s): DIALYSIS/PERMA CATHETER INSERTION (N/A) as a surgical intervention.  The patient's history has been reviewed, patient examined, no change in status, stable for surgery.  I have reviewed the patient's chart and labs.  Questions were answered to the patient's satisfaction.     [name]

## 2021-12-31 ENCOUNTER — Encounter: Payer: Self-pay | Admitting: Student in an Organized Health Care Education/Training Program

## 2021-12-31 DIAGNOSIS — R1110 Vomiting, unspecified: Secondary | ICD-10-CM | POA: Diagnosis not present

## 2021-12-31 DIAGNOSIS — I10 Essential (primary) hypertension: Secondary | ICD-10-CM | POA: Diagnosis present

## 2021-12-31 LAB — RENAL FUNCTION PANEL
Albumin: 2.6 g/dL — ABNORMAL LOW (ref 3.5–5.0)
Anion gap: 9 (ref 5–15)
BUN: 20 mg/dL (ref 6–20)
CO2: 27 mmol/L (ref 22–32)
Calcium: 7.8 mg/dL — ABNORMAL LOW (ref 8.9–10.3)
Chloride: 98 mmol/L (ref 98–111)
Creatinine, Ser: 5.08 mg/dL — ABNORMAL HIGH (ref 0.61–1.24)
GFR, Estimated: 12 mL/min — ABNORMAL LOW (ref >60)
Glucose, Bld: 95 mg/dL (ref 70–99)
Phosphorus: 2.3 mg/dL — ABNORMAL LOW (ref 2.5–4.6)
Potassium: 3 mmol/L — ABNORMAL LOW (ref 3.5–5.1)
Sodium: 134 mmol/L — ABNORMAL LOW (ref 135–145)

## 2021-12-31 LAB — MAGNESIUM: Magnesium: 1.7 mg/dL (ref 1.7–2.4)

## 2021-12-31 MED ORDER — POLYETHYLENE GLYCOL 3350 17 G PO PACK
17.0000 g | PACK | Freq: Every day | ORAL | Status: DC
  Administered 2021-12-31 – 2022-01-05 (×6): 17 g via ORAL
  Filled 2021-12-31 (×6): qty 1

## 2021-12-31 MED ORDER — POTASSIUM CHLORIDE CRYS ER 20 MEQ PO TBCR
20.0000 meq | EXTENDED_RELEASE_TABLET | Freq: Two times a day (BID) | ORAL | Status: AC
  Administered 2021-12-31 (×2): 20 meq via ORAL
  Filled 2021-12-31 (×2): qty 1

## 2021-12-31 NOTE — Progress Notes (Signed)
Progress Note   Patient: Randall [name] GBT:[id number] DOB: 11/23/1960 DOA: 12/22/2021     7 DOS: the patient was seen and examined on 12/31/2021   Brief hospital course: Taken from prior notes.  Randall [name] is a 61 y.o. male with a PMH significant for dementia, HTN, HFpEF, CKD 3a, CVA. They presented from home to the ED on 12/22/2021 with intractable vomiting which has been ongoing for weeks. He was recently admitted on 2/3 with similar symptoms and had a mostly unremarkable work up. In the ED, it was found that they had normal vital signs, mild leukocytosis, AKI. CT abdomen showed constipation. Otherwise unremarkable labs.  They were treated with supportive care including IV fluids and antiemetics.   Due to persistent nausea and vomiting, GI was consulted and patient underwent EGD yesterday on 12/26/2021 which shows esophagitis, gastritis and few nonbleeding erosions involving first part of duodenum.  Biopsies were taken.  Due to worsening renal function nephrology decided to start dialysis, HD catheter was placed by vascular surgery and patient will receive his first session today.  2/22.  Patient receives second session of dialysis today.  Most likely will need permanent HD catheter and outpatient dialysis chair before discharge. Remained oliguric.  2/23: Patient was seen after the third session of dialysis today.  He appears little more confused than his baseline.  He was slow and able to just tell me his name.  Permanent catheter will be placed tomorrow by vascular surgery.  Nephrology started the procedure for outpatient dialysis chair.  2/24: Permanent catheter was placed by vascular surgery earlier today.  Tolerated the procedure well.  Plan is for another session of dialysis later today.  Waiting for outpatient dialysis chair. PT is recommending home health.  2/25: Awaiting outpatient dialysis chair.   Assessment and Plan: * Recurrent vomiting- (present on  admission) Improved. Recurrent episodes with second hospitalization in 2 weeks for the same No definite etiology identified. GI was consulted and patient underwent EGD on 12/26/2020 which shows esophagitis, some esophageal dysmotility, gastritis and few nonbleeding erosions in duodenum, biopsies were taken. -Continue with Protonix -Nutritionist evaluation   Acute renal failure superimposed on stage 3a chronic kidney disease ([place]) Worsening renal function despite gentle IV hydration. Nephrology was consulted and they decided to proceed with hemodialysis. Patient received permanent HD catheter today.  Received 3 sessions of initial dialysis with temporary catheter which was removed today. Patient remained oliguric with not much improvement in renal function.   nephrology started working on outpatient dialysis chair.  Most likely ESRD now -Monitor renal function -Avoid nephrotoxins  Chronic systolic CHF (congestive heart failure) ([place])- (present on admission) Currently euvolemic -Continue Coreg, Carvedilol  -We will restart Entresto as patient is most likely ESRD now  Dementia ([place])- (present on admission) Continue Namenda and sertraline  History of CVA (cerebrovascular accident) Continue aspirin and statin  Moderate major depression ([place]) - Continue Abilify and Zoloft  Essential hypertension Blood pressure remained elevated. -Continue amlodipine and Entresto -Nephrology is adding hydralazine -Continue to monitor  Leukocytosis Likely secondary to vomiting Acute infection not suspected   Subjective: Patient was seen and examined today.  No new complaints.  Physical Exam: Vitals:   12/30/21 2042 12/31/21 0616 12/31/21 0811 12/31/21 1543  BP: (!) 174/82 (!) 184/74 (!) 184/86 (!) 164/76  Pulse: 70 78 78 71  Resp: 18 20 18 18   Temp: 98.2 F (36.8 C) 98.3 F (36.8 C) 98.3 F (36.8 C) 98 F (36.7 C)  TempSrc: Oral Oral  Oral Oral  SpO2: 99% 98% 100% 100%  Weight:       Height:       General.  In no acute distress. Pulmonary.  Lungs clear bilaterally, normal respiratory effort. CV.  Regular rate and rhythm, no JVD, rub or murmur. Abdomen.  Soft, nontender, nondistended, BS positive. CNS.  Alert and oriented .  No focal neurologic deficit. Extremities.  No edema, no cyanosis, pulses intact and symmetrical. Psychiatry.  Judgment and insight appears normal.  Data Reviewed: Reviewed prior notes and labs.  Family Communication: Discussed with nephew at bedside  Disposition: Status is: Inpatient Remains inpatient appropriate because: Severity of illness.  Awaiting outpatient dialysis chair confirmation.  Medically stable now   Planned Discharge Destination: Home with Home Health   Time spent: 40 minutes  This record has been created using Systems analyst. Errors have been sought and corrected,but may not always be located. Such creation errors do not reflect on the standard of care.  Author: Lorella Nimrod, MD 12/31/2021 4:06 PM  For on call review [email or web address].

## 2021-12-31 NOTE — Assessment & Plan Note (Signed)
Blood pressure remained elevated. -Continue amlodipine and Entresto -Nephrology is adding hydralazine -Continue to monitor

## 2021-12-31 NOTE — Plan of Care (Signed)
  Problem: Activity: Goal: Risk for activity intolerance will decrease Outcome: Progressing   Problem: Nutrition: Goal: Adequate nutrition will be maintained Outcome: Progressing   Problem: Coping: Goal: Level of anxiety will decrease Outcome: Progressing   

## 2021-12-31 NOTE — Progress Notes (Signed)
[facility]  ROUNDING NOTE   Subjective:  Pt is admitted to [facility] on 12/22/2021 with intractable vomiting on 12/22/2021 with intractable vomiting which has been going for weeks.  He noted to have mild leukocytosis and AKI on admission patient has a past medical history significant for dementia, hypertension, HFpEF, CKD 3a and CVA.  We were asked to see the patient for AKI with a significantly elevated BUN and creatinine  Patient resting comfortably   Objective:  Vital signs in last 24 hours:  Temp:  [98.2 F (36.8 C)-98.8 F (37.1 C)] 98.3 F (36.8 C) (02/25 0811) Pulse Rate:  [66-82] 78 (02/25 0811) Resp:  [9-20] 18 (02/25 0811) BP: (166-214)/(72-138) 184/86 (02/25 0811) SpO2:  [95 %-100 %] 100 % (02/25 0811) Weight:  [77.2 kg-78.8 kg] 77.2 kg (02/24 2000)  Weight change: -0.1 kg Filed Weights   12/30/21 0804 12/30/21 1455 12/30/21 2000  Weight: 79.5 kg 78.8 kg 77.2 kg    Intake/Output: I/O last 3 completed shifts: In: -  Out: 924 [Urine:300; Other:624]   Intake/Output this shift:  No intake/output data recorded.  Physical Exam: General: NAD, lethargic  Head: Normocephalic, atraumatic. Moist oral mucosal membranes  Eyes: Anicteric  Lungs:  Clear to auscultation, normal effort  Heart: S1S2 no rubs  Abdomen:  Soft, nontender, bowel sounds present  Extremities:  Trace peripheral edema.  Neurologic: Lethargic following simple commands  Skin: No acute rash  Access: Right femoral HD temp cath, Rt IJ permcath placed by Dr Lucky Cowboy on 63/89/37    Basic Metabolic Panel: Recent Labs  Lab 12/27/21 0409 12/28/21 0421 12/29/21 0445 12/30/21 0439 12/31/21 0505  NA 139 139 138 137 134*  K 3.9 3.4* 3.2* 3.2* 3.0*  CL 113* 106 102 103 98  CO2 15* 21* '25 25 27  ' GLUCOSE 88 99 107* 94 95  BUN 57* 46* 33* 33* 20  CREATININE 8.48* 7.45* 6.94* 7.33* 5.08*  CALCIUM 8.0* 8.1* 7.9* 7.8* 7.8*  MG  --   --   --   --  1.7  PHOS 4.7* 4.1 3.0 3.7 2.3*    Liver Function  Tests: Recent Labs  Lab 12/27/21 0409 12/28/21 0421 12/29/21 0445 12/30/21 0439 12/31/21 0505  ALBUMIN 2.7* 2.6* 2.7* 2.7* 2.6*   No results for input(s): LIPASE, AMYLASE in the last 168 hours.  No results for input(s): AMMONIA in the last 168 hours.  CBC: Recent Labs  Lab 12/24/21 1532 12/25/21 0529 12/27/21 0409  WBC 11.0* 10.4 9.9  HGB 11.4* 11.0* 12.7*  HCT 33.7* 32.7* 37.3*  MCV 94.4 94.0 93.0  PLT 199 197 209    Cardiac Enzymes: No results for input(s): CKTOTAL, CKMB, CKMBINDEX, TROPONINI in the last 168 hours.  BNP: Invalid input(s): POCBNP  CBG: Recent Labs  Lab 12/26/21 1314 12/26/21 1402 12/26/21 1442 12/29/21 0421  GLUCAP 62* 97 108* 106*    Microbiology: Results for orders placed or performed during the hospital encounter of 12/22/21  Resp Panel by RT-PCR (Flu A&B, Covid) Nasopharyngeal Swab     Status: None   Collection Time: 12/22/21 11:18 PM   Specimen: Nasopharyngeal Swab; Nasopharyngeal(NP) swabs in vial transport medium  Result Value Ref Range Status   SARS Coronavirus 2 by RT PCR NEGATIVE NEGATIVE Final    Comment: (NOTE) SARS-CoV-2 target nucleic acids are NOT DETECTED.  The SARS-CoV-2 RNA is generally detectable in upper respiratory specimens during the acute phase of infection. The lowest concentration of SARS-CoV-2 viral copies this assay can detect is 138 copies/mL.  A negative result does not preclude SARS-Cov-2 infection and should not be used as the sole basis for treatment or other patient management decisions. A negative result may occur with  improper specimen collection/handling, submission of specimen other than nasopharyngeal swab, presence of viral mutation(s) within the areas targeted by this assay, and inadequate number of viral copies(<138 copies/mL). A negative result must be combined with clinical observations, patient history, and epidemiological information. The expected result is Negative.  Fact Sheet for  Patients:  EntrepreneurPulse.com.au  Fact Sheet for Healthcare Providers:  IncredibleEmployment.be  This test is no t yet approved or cleared by the [place] FDA and  has been authorized for detection and/or diagnosis of SARS-CoV-2 by FDA under an Emergency Use Authorization (EUA). This EUA will remain  in effect (meaning this test can be used) for the duration of the COVID-19 declaration under Section 564(b)(1) of the Act, 21 U.S.C.section 360bbb-3(b)(1), unless the authorization is terminated  or revoked sooner.       Influenza A by PCR NEGATIVE NEGATIVE Final   Influenza B by PCR NEGATIVE NEGATIVE Final    Comment: (NOTE) The Xpert Xpress SARS-CoV-2/FLU/RSV plus assay is intended as an aid in the diagnosis of influenza from Nasopharyngeal swab specimens and should not be used as a sole basis for treatment. Nasal washings and aspirates are unacceptable for Xpert Xpress SARS-CoV-2/FLU/RSV testing.  Fact Sheet for Patients: EntrepreneurPulse.com.au  Fact Sheet for Healthcare Providers: IncredibleEmployment.be  This test is not yet approved or cleared by the [place] FDA and has been authorized for detection and/or diagnosis of SARS-CoV-2 by FDA under an Emergency Use Authorization (EUA). This EUA will remain in effect (meaning this test can be used) for the duration of the COVID-19 declaration under Section 564(b)(1) of the Act, 21 U.S.C. section 360bbb-3(b)(1), unless the authorization is terminated or revoked.  Performed at [facility], [address] [place], [place] [address]     Coagulation Studies: No results for input(s): LABPROT, INR in the last 72 hours.  Urinalysis: No results for input(s): COLORURINE, LABSPEC, PHURINE, GLUCOSEU, HGBUR, BILIRUBINUR, KETONESUR, PROTEINUR, UROBILINOGEN, NITRITE, LEUKOCYTESUR in the last 72 hours.  Invalid input(s): APPERANCEUR      Imaging: PERIPHERAL VASCULAR CATHETERIZATION  Result Date: 12/30/2021 See surgical note for result.    Medications:      amLODipine  10 mg Oral Daily   ARIPiprazole  1 mg Oral Daily   aspirin EC  81 mg Oral Daily   carvedilol  25 mg Oral BID WC   Chlorhexidine Gluconate Cloth  6 each Topical Q0600   diclofenac Sodium  2 g Topical QID   memantine  5 mg Oral Daily   pantoprazole  40 mg Oral BID   potassium chloride  20 mEq Oral BID   rosuvastatin  10 mg Oral Daily   sacubitril-valsartan  1 tablet Oral BID   sertraline  100 mg Oral Daily   sodium bicarbonate  650 mg Oral TID   tamsulosin  0.4 mg Oral Daily   acetaminophen **OR** acetaminophen, hydrALAZINE, HYDROmorphone (DILAUDID) injection, ondansetron **OR** ondansetron (ZOFRAN) IV, ondansetron (ZOFRAN) IV, sorbitol, milk of mag, mineral oil, glycerin (SMOG) enema  Assessment/ Plan:  61 y.o. male with a past medical history significant for dementia, hypertension, heart failure, CKD 3a and CVA admitted to [facility] on 12/22/2021 with intractable vomiting.  Also noted to have mild leukocytosis and AKI.   1. AKI on CKD 3a with baseline creatinine of 1.4 and EGFR of 52.  It is felt that acute kidney injury could have been contributed from usage of Advil/NSAIDs and from dehydration secondary to intractable vomiting.   Patient received dialysis yesterday after tunnel catheter placement Search in progress for outpatient clinic  2. recurrent vomiting likely multifactorial including advancing dementia ,medication side effect or functional dyspepsia.  GI is consulted.  EGD completed with no signs of active bleeding.  Did see gastritis, small hiatal hernia and LA grade B reflux esophagitis.  Concerning areas biopsied.    3. Anemia of chronic kidney disease  Normocytic Lab Results  Component Value Date   HGB 12.7 (L) 12/27/2021  Hgb within range   4.Hypertension Blood pressure is uncontrolled Patient is on amlodipine 10 mg p.o.  daily Coreg 25 mg p.o. twice daily Entresto 1 tablet p.o. twice daily We will start patient on hydralazine if blood pressure is not better after dialysis   5.Hypokalemia We will dialyze patient with higher K bath We will also replete potassium  6.  Hyponatremia Patient has hyponatremia secondary to severe AKI/inability to get rid of free water We will follow   LOS: 7 [facility] [place] 2/25/202312:04 PM

## 2022-01-01 DIAGNOSIS — R1110 Vomiting, unspecified: Secondary | ICD-10-CM | POA: Diagnosis not present

## 2022-01-01 LAB — RENAL FUNCTION PANEL
Albumin: 2.6 g/dL — ABNORMAL LOW (ref 3.5–5.0)
Anion gap: 10 (ref 5–15)
BUN: 24 mg/dL — ABNORMAL HIGH (ref 6–20)
CO2: 29 mmol/L (ref 22–32)
Calcium: 8.1 mg/dL — ABNORMAL LOW (ref 8.9–10.3)
Chloride: 97 mmol/L — ABNORMAL LOW (ref 98–111)
Creatinine, Ser: 6.33 mg/dL — ABNORMAL HIGH (ref 0.61–1.24)
GFR, Estimated: 9 mL/min — ABNORMAL LOW (ref >60)
Glucose, Bld: 102 mg/dL — ABNORMAL HIGH (ref 70–99)
Phosphorus: 3.2 mg/dL (ref 2.5–4.6)
Potassium: 3.5 mmol/L (ref 3.5–5.1)
Sodium: 136 mmol/L (ref 135–145)

## 2022-01-01 LAB — GLUCOSE, CAPILLARY: Glucose-Capillary: 117 mg/dL — ABNORMAL HIGH (ref 70–99)

## 2022-01-01 MED ORDER — HYDRALAZINE HCL 25 MG PO TABS
25.0000 mg | ORAL_TABLET | Freq: Three times a day (TID) | ORAL | Status: DC
  Administered 2022-01-01 – 2022-01-09 (×23): 25 mg via ORAL
  Filled 2022-01-01 (×23): qty 1

## 2022-01-01 NOTE — Plan of Care (Signed)
  Problem: Activity: Goal: Risk for activity intolerance will decrease Outcome: Progressing   Problem: Nutrition: Goal: Adequate nutrition will be maintained Outcome: Progressing   Problem: Coping: Goal: Level of anxiety will decrease Outcome: Progressing   Problem: Pain Managment: Goal: General experience of comfort will improve Outcome: Progressing   Problem: Safety: Goal: Ability to remain free from injury will improve Outcome: Progressing   

## 2022-01-01 NOTE — Progress Notes (Signed)
[facility]  ROUNDING NOTE   Subjective:  Pt is admitted to [facility] on 12/22/2021 with intractable vomiting on 12/22/2021 with intractable vomiting which has been going for weeks.  He noted to have mild leukocytosis and AKI on admission patient has a past medical history significant for dementia, hypertension, HFpEF, CKD 3a and CVA.  We were asked to see the patient for AKI with a significantly elevated BUN and creatinine  Patient resting comfortably   Objective:  Vital signs in last 24 hours:  Temp:  [98 F (36.7 C)-98.5 F (36.9 C)] 98.5 F (36.9 C) (02/26 0840) Pulse Rate:  [71-76] 74 (02/26 0840) Resp:  [18-20] 18 (02/26 0840) BP: (144-194)/(66-83) 194/83 (02/26 0840) SpO2:  [100 %] 100 % (02/26 0840)  Weight change:  Filed Weights   12/30/21 0804 12/30/21 1455 12/30/21 2000  Weight: 79.5 kg 78.8 kg 77.2 kg    Intake/Output: I/O last 3 completed shifts: In: 480 [P.O.:480] Out: 2012 [Urine:1200; Other:812]   Intake/Output this shift:  No intake/output data recorded.  Physical Exam: General: NAD, lethargic  Head: Normocephalic, atraumatic. Moist oral mucosal membranes  Eyes: Anicteric  Lungs:  Clear to auscultation, normal effort  Heart: S1S2 no rubs  Abdomen:  Soft, nontender, bowel sounds present  Extremities:  Trace peripheral edema.  Neurologic: Lethargic following simple commands  Skin: No acute rash  Access: Right femoral HD temp cath, Rt IJ permcath placed by Dr Lucky Cowboy on 76/72/09    Basic Metabolic Panel: Recent Labs  Lab 12/28/21 0421 12/29/21 0445 12/30/21 0439 12/31/21 0505 01/01/22 0404  NA 139 138 137 134* 136  K 3.4* 3.2* 3.2* 3.0* 3.5  CL 106 102 103 98 97*  CO2 21* _0 GLUCOSE 99 107* 94 95 102*  BUN 46* 33* 33* 20 24*  CREATININE 7.45* 6.94* 7.33* 5.08* 6.33*  CALCIUM 8.1* 7.9* 7.8* 7.8* 8.1*  MG  --   --   --  1.7  --   PHOS 4.1 3.0 3.7 2.3* 3.2    Liver Function Tests: Recent Labs  Lab 12/28/21 0421  12/29/21 0445 12/30/21 0439 12/31/21 0505 01/01/22 0404  ALBUMIN 2.6* 2.7* 2.7* 2.6* 2.6*   No results for input(s): LIPASE, AMYLASE in the last 168 hours.  No results for input(s): AMMONIA in the last 168 hours.  CBC: Recent Labs  Lab 12/27/21 0409  WBC 9.9  HGB 12.7*  HCT 37.3*  MCV 93.0  PLT 209    Cardiac Enzymes: No results for input(s): CKTOTAL, CKMB, CKMBINDEX, TROPONINI in the last 168 hours.  BNP: Invalid input(s): POCBNP  CBG: Recent Labs  Lab 12/26/21 1314 12/26/21 1402 12/26/21 1442 12/29/21 0421  GLUCAP 62* 97 108* 106*    Microbiology: Results for orders placed or performed during the hospital encounter of 12/22/21  Resp Panel by RT-PCR (Flu A&B, Covid) Nasopharyngeal Swab     Status: None   Collection Time: 12/22/21 11:18 PM   Specimen: Nasopharyngeal Swab; Nasopharyngeal(NP) swabs in vial transport medium  Result Value Ref Range Status   SARS Coronavirus 2 by RT PCR NEGATIVE NEGATIVE Final    Comment: (NOTE) SARS-CoV-2 target nucleic acids are NOT DETECTED.  The SARS-CoV-2 RNA is generally detectable in upper respiratory specimens during the acute phase of infection. The lowest concentration of SARS-CoV-2 viral copies this assay can detect is 138 copies/mL. A negative result does not preclude SARS-Cov-2 infection and should not be used as the sole basis for treatment or other patient management decisions.  A negative result may occur with  improper specimen collection/handling, submission of specimen other than nasopharyngeal swab, presence of viral mutation(s) within the areas targeted by this assay, and inadequate number of viral copies(<138 copies/mL). A negative result must be combined with clinical observations, patient history, and epidemiological information. The expected result is Negative.  Fact Sheet for Patients:  EntrepreneurPulse.com.au  Fact Sheet for Healthcare Providers:   IncredibleEmployment.be  This test is no t yet approved or cleared by the [place] FDA and  has been authorized for detection and/or diagnosis of SARS-CoV-2 by FDA under an Emergency Use Authorization (EUA). This EUA will remain  in effect (meaning this test can be used) for the duration of the COVID-19 declaration under Section 564(b)(1) of the Act, 21 U.S.C.section 360bbb-3(b)(1), unless the authorization is terminated  or revoked sooner.       Influenza A by PCR NEGATIVE NEGATIVE Final   Influenza B by PCR NEGATIVE NEGATIVE Final    Comment: (NOTE) The Xpert Xpress SARS-CoV-2/FLU/RSV plus assay is intended as an aid in the diagnosis of influenza from Nasopharyngeal swab specimens and should not be used as a sole basis for treatment. Nasal washings and aspirates are unacceptable for Xpert Xpress SARS-CoV-2/FLU/RSV testing.  Fact Sheet for Patients: EntrepreneurPulse.com.au  Fact Sheet for Healthcare Providers: IncredibleEmployment.be  This test is not yet approved or cleared by the [place] FDA and has been authorized for detection and/or diagnosis of SARS-CoV-2 by FDA under an Emergency Use Authorization (EUA). This EUA will remain in effect (meaning this test can be used) for the duration of the COVID-19 declaration under Section 564(b)(1) of the Act, 21 U.S.C. section 360bbb-3(b)(1), unless the authorization is terminated or revoked.  Performed at [facility], [address] [place], [place] [address]     Coagulation Studies: No results for input(s): LABPROT, INR in the last 72 hours.  Urinalysis: No results for input(s): COLORURINE, LABSPEC, PHURINE, GLUCOSEU, HGBUR, BILIRUBINUR, KETONESUR, PROTEINUR, UROBILINOGEN, NITRITE, LEUKOCYTESUR in the last 72 hours.  Invalid input(s): APPERANCEUR     Imaging: PERIPHERAL VASCULAR CATHETERIZATION  Result Date: 12/30/2021 See surgical note for  result.    Medications:      amLODipine  10 mg Oral Daily   ARIPiprazole  1 mg Oral Daily   aspirin EC  81 mg Oral Daily   carvedilol  25 mg Oral BID WC   Chlorhexidine Gluconate Cloth  6 each Topical Q0600   diclofenac Sodium  2 g Topical QID   pantoprazole  40 mg Oral BID   polyethylene glycol  17 g Oral Daily   rosuvastatin  10 mg Oral Daily   sacubitril-valsartan  1 tablet Oral BID   sertraline  100 mg Oral Daily   sodium bicarbonate  650 mg Oral TID   tamsulosin  0.4 mg Oral Daily   acetaminophen **OR** acetaminophen, hydrALAZINE, HYDROmorphone (DILAUDID) injection, ondansetron **OR** ondansetron (ZOFRAN) IV, ondansetron (ZOFRAN) IV, sorbitol, milk of mag, mineral oil, glycerin (SMOG) enema  Assessment/ Plan:  61 y.o. male with a past medical history significant for dementia, hypertension, heart failure, CKD 3a and CVA admitted to [facility] on 12/22/2021 with intractable vomiting.  Also noted to have mild leukocytosis and AKI.   1. AKI on CKD 3a with baseline creatinine of 1.4 and EGFR of 52.     It is felt that acute kidney injury could have been contributed from usage of Advil/NSAIDs and from dehydration secondary to intractable vomiting.   Patient was last dialyzed yesterday No need  for renal placement therapy today. Search in progress for outpatient clinic  2. recurrent vomiting likely multifactorial including advancing dementia ,medication side effect or functional dyspepsia.  GI is consulted.  EGD completed with no signs of active bleeding.  Did see gastritis, small hiatal hernia and LA grade B reflux esophagitis.  Concerning areas biopsied.    3. Anemia of chronic kidney disease  Normocytic Lab Results  Component Value Date   HGB 12.7 (L) 12/27/2021  Hgb within range   4.Hypertension Blood pressure is uncontrolled Patient is on amlodipine 10 mg p.o. daily Coreg 25 mg p.o. twice daily Entresto 1 tablet p.o. twice daily We will start patient on  hydralazine.   5.Hypokalemia Patient potassium was repleted . Potassium now better   6.  Hyponatremia Patient has hyponatremia secondary to severe AKI/inability to get rid of free water  Now better   Plan We will dialyze patient tomorrow   LOS: [place] 2/26/20238:47 AM

## 2022-01-01 NOTE — Assessment & Plan Note (Signed)
Worsening renal function despite gentle IV hydration. Nephrology was consulted and they decided to proceed with hemodialysis. Patient received permanent HD catheter today.  Received 3 sessions of initial dialysis with temporary catheter which was removed today. Patient remained oliguric with not much improvement in renal function.   nephrology started working on outpatient dialysis chair.  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

## 2022-01-01 NOTE — Assessment & Plan Note (Signed)
Blood pressure remained elevated. -Continue amlodipine and Entresto -Add hydralazine 25 mg every 8 hourly -Continue to monitor

## 2022-01-01 NOTE — Progress Notes (Signed)
Mobility Specialist - Progress Note   01/01/22 1400  Mobility  Range of Motion/Exercises Right leg;Left leg;Right arm;Left arm  Level of Assistance Modified independent, requires aide device or extra time  Distance Ambulated (ft) 0 ft  Activity Response Tolerated well  $Mobility charge 1 Mobility   Pt supine upon arrival using RA. Pt voiced complaint of mild pain in L shoulder from cath. Pt completes Therex  on LLE and BUE----- weakness noted in R hand. Pt left with alarm set, family at bedside and needs in reach.  Merrily Brittle Mobility Specialist 01/01/22, 2:44 PM

## 2022-01-01 NOTE — Progress Notes (Signed)
Mobility Specialist - Progress Note    01/01/22 1200  Mobility  Activity Refused mobility    Pt with family upon arrival utilizing RA. Pt refused treatment d/t getting his hair cut at the moment. Agreeable to session later this afternoon. Will return.  Merrily Brittle Mobility Specialist 01/01/22, 12:03 PM

## 2022-01-01 NOTE — Progress Notes (Signed)
Progress Note   Patient: Randall [name]:[id number] DOB: 1961/11/06 DOA: 12/22/2021     8 DOS: the patient was seen and examined on 01/01/2022   Brief hospital course: Taken from prior notes.  Randall [name] is a 61 y.o. male with a PMH significant for dementia, HTN, HFpEF, CKD 3a, CVA. They presented from home to the ED on 12/22/2021 with intractable vomiting which has been ongoing for weeks. He was recently admitted on 2/3 with similar symptoms and had a mostly unremarkable work up. In the ED, it was found that they had normal vital signs, mild leukocytosis, AKI. CT abdomen showed constipation. Otherwise unremarkable labs.  They were treated with supportive care including IV fluids and antiemetics.   Due to persistent nausea and vomiting, GI was consulted and patient underwent EGD yesterday on 12/26/2021 which shows esophagitis, gastritis and few nonbleeding erosions involving first part of duodenum.  Biopsies were taken.  Due to worsening renal function nephrology decided to start dialysis, HD catheter was placed by vascular surgery and patient will receive his first session today.  2/22.  Patient receives second session of dialysis today.  Most likely will need permanent HD catheter and outpatient dialysis chair before discharge. Remained oliguric.  2/23: Patient was seen after the third session of dialysis today.  He appears little more confused than his baseline.  He was slow and able to just tell me his name.  Permanent catheter will be placed tomorrow by vascular surgery.  Nephrology started the procedure for outpatient dialysis chair.  2/24: Permanent catheter was placed by vascular surgery earlier today.  Tolerated the procedure well.  Plan is for another session of dialysis later today.  Waiting for outpatient dialysis chair. PT is recommending home health.  2/25: Awaiting outpatient dialysis chair.  2/26: Hypokalemia resolved.  Started on hydralazine for persistently  elevated blood pressure. We will get dialysis tomorrow, hopefully to get some news about outpatient dialysis chair tomorrow.   Assessment and Plan: * Recurrent vomiting- (present on admission) Improved. Recurrent episodes with second hospitalization in 2 weeks for the same No definite etiology identified. GI was consulted and patient underwent EGD on 12/26/2020 which shows esophagitis, some esophageal dysmotility, gastritis and few nonbleeding erosions in duodenum, biopsies were taken. -Continue with Protonix -Nutritionist evaluation   Acute renal failure superimposed on stage 3a chronic kidney disease ([name]) Worsening renal function despite gentle IV hydration. Nephrology was consulted and they decided to proceed with hemodialysis. Patient received permanent HD catheter today.  Received 3 sessions of initial dialysis with temporary catheter which was removed today. Patient remained oliguric with not much improvement in renal function.   nephrology started working on outpatient dialysis chair.  Most likely ESRD now -Monitor renal function -Avoid nephrotoxins  Chronic systolic CHF (congestive heart failure) ([name])- (present on admission) Currently euvolemic -Continue Coreg, Carvedilol  -We will restart Entresto as patient is most likely ESRD now  Dementia ([name])- (present on admission) Continue Namenda and sertraline  History of CVA (cerebrovascular accident) Continue aspirin and statin  Moderate major depression ([name]) - Continue Abilify and Zoloft  Essential hypertension- (present on admission) Blood pressure remained elevated. -Continue amlodipine and Entresto -Add hydralazine 25 mg every 8 hourly -Continue to monitor  Leukocytosis Likely secondary to vomiting Acute infection not suspected   Subjective: Patient was seen and examined today.  No complaints.  Watching TV comfortably.  Sister at bedside.  Physical Exam: Vitals:   01/01/22 0521 01/01/22 0840 01/01/22 1048  01/01/22 1452  BP: Marland Kitchen)  163/71 (!) 194/83 137/67 (!) 141/76  Pulse: 73 74 75 74  Resp: 20 18    Temp: 98.3 F (36.8 C) 98.5 F (36.9 C)    TempSrc:  Oral    SpO2: 100% 100%    Weight:      Height:       General.     In no acute distress. Pulmonary.  Lungs clear bilaterally, normal respiratory effort. CV.  Regular rate and rhythm, no JVD, rub or murmur. Abdomen.  Soft, nontender, nondistended, BS positive. CNS.  Alert and oriented .  No focal neurologic deficit. Extremities.  No edema, no cyanosis, pulses intact and symmetrical. Psychiatry.  Judgment and insight appears normal.  Data Reviewed: Reviewed labs and notes.  Family Communication: Discussed with sister at bedside  Disposition: Status is: Inpatient Remains inpatient appropriate because: Severity of illness  Planned Discharge Destination: Home with Home Health  Time spent: 40 minutes  This record has been created using Systems analyst. Errors have been sought and corrected,but may not always be located. Such creation errors do not reflect on the standard of care.  Author: Lorella Nimrod, MD 01/01/2022 3:18 PM  For on call review [email or web address].

## 2022-01-02 DIAGNOSIS — R1110 Vomiting, unspecified: Secondary | ICD-10-CM | POA: Diagnosis not present

## 2022-01-02 LAB — GLUCOSE, CAPILLARY: Glucose-Capillary: 90 mg/dL (ref 70–99)

## 2022-01-02 MED ORDER — NEPRO/CARBSTEADY PO LIQD
237.0000 mL | Freq: Three times a day (TID) | ORAL | Status: DC
  Administered 2022-01-02 – 2022-01-17 (×13): 237 mL via ORAL

## 2022-01-02 MED ORDER — RENA-VITE PO TABS
1.0000 | ORAL_TABLET | Freq: Every day | ORAL | Status: DC
Start: 2022-01-02 — End: 2022-01-17
  Administered 2022-01-02 – 2022-01-16 (×15): 1 via ORAL
  Filled 2022-01-02 (×15): qty 1

## 2022-01-02 MED ORDER — HEPARIN SODIUM (PORCINE) 5000 UNIT/ML IJ SOLN
5000.0000 [IU] | Freq: Three times a day (TID) | INTRAMUSCULAR | Status: DC
Start: 2022-01-02 — End: 2022-01-17
  Administered 2022-01-02 – 2022-01-17 (×45): 5000 [IU] via SUBCUTANEOUS
  Filled 2022-01-02 (×44): qty 1

## 2022-01-02 NOTE — Progress Notes (Signed)
Physical Therapy Treatment Patient Details Name: Randall [name] MRN: [id number] DOB: 1961-04-04 Today's Date: 01/02/2022   History of Present Illness Randall [name] is a 61 y.o. male seen for evaluation of recurrent vomiting at the request of Dr. Judd Gaudier. Patient has a PMH of dementia, hypertension, diabetes, chronic kidney disease, CHF.  Patient presented to the [facility] ED for chief complaint of nausea, vomiting and weakness. Upon presentation to the ED yesterday, vital signs were stable with blood pressure 120/72, heart rate 67, respirations 18, temperature 98.7, pulse oximetry 97%. Labs were significant for new AKI with Cr 3.5, normal electrolytes GFR 19, glucose 176, WBC 17.9, UA unremarkable.  Imaging studies revealed CT of the abdomen pelvis revealed a possible tiny hiatal hernia, constipation, otherwise no acute intra-abdominal or intrapelvic abnormality.  Aortic atherosclerosis.  He is being admitted for IV hydration.  GI is consulted for evaluation and management of recurrent vomiting.    PT Comments    Patient sleeping on arrival to room. Awakens to voice and light touch. He is cooperative and agreeable to PT today and is making progress with functional independence. Patient ambulated with supervision and occasional cues for proper rolling walker use. No significant shortness of breath noted with activity and Sp02 95% on room air.  Anticipate patient can return home with intermittent supervision from family. PT will continue to follow.    Recommendations for follow up therapy are one component of a multi-disciplinary discharge planning process, led by the attending physician.  Recommendations may be updated based on patient status, additional functional criteria and insurance authorization.  Follow Up Recommendations  Home health PT     Assistance Recommended at Discharge Intermittent Supervision/Assistance  Patient can return home with the following A little help with walking  and/or transfers;A little help with bathing/dressing/bathroom;Help with stairs or ramp for entrance;Assist for transportation;Direct supervision/assist for financial management;Direct supervision/assist for medications management   Equipment Recommendations  Rolling walker (2 wheels)    Recommendations for Other Services       Precautions / Restrictions Precautions Precautions: Fall Restrictions Weight Bearing Restrictions: No     Mobility  Bed Mobility Overal bed mobility: Modified Independent             General bed mobility comments: tactile cues for task initiation. no physical assistance required    Transfers Overall transfer level: Needs assistance Equipment used: Rolling walker (2 wheels) Transfers: Sit to/from Stand Sit to Stand: Supervision           General transfer comment: supervision for safety, no loss of balance noted    Ambulation/Gait Ambulation/Gait assistance: Supervision Gait Distance (Feet): 35 Feet Assistive device: Rolling walker (2 wheels) Gait Pattern/deviations: Decreased stride length Gait velocity: decreased     General Gait Details: verbal cues for technique using rolling walker with turns. patient demonstrated understanding. no loss of balance with ambulation. Sp02 95% on room air after walking   Stairs             Wheelchair Mobility    Modified Rankin (Stroke Patients Only)       Balance Overall balance assessment: Needs assistance Sitting-balance support: No upper extremity supported, Feet supported Sitting balance-Leahy Scale: Good     Standing balance support: Bilateral upper extremity supported Standing balance-Leahy Scale: Fair Standing balance comment: no loss of balance with UE supported on rolling walker  Cognition Arousal/Alertness: Awake/alert Behavior During Therapy: WFL for tasks assessed/performed Overall Cognitive Status: Within Functional Limits for tasks  assessed                                 General Comments: patient is able to follow all commands with increased time, cooperative throughout session        Exercises      General Comments        Pertinent Vitals/Pain Pain Assessment Pain Assessment: No/denies pain    Home Living                          Prior Function            PT Goals (current goals can now be found in the care plan section) Acute Rehab PT Goals Patient Stated Goal: to return home PT Goal Formulation: With patient Time For Goal Achievement: 01/06/22 Potential to Achieve Goals: Fair Progress towards PT goals: Progressing toward goals    Frequency    Min 2X/week      PT Plan Current plan remains appropriate    Co-evaluation              AM-PAC PT "6 Clicks" Mobility   Outcome Measure  Help needed turning from your back to your side while in a flat bed without using bedrails?: A Little Help needed moving from lying on your back to sitting on the side of a flat bed without using bedrails?: A Little Help needed moving to and from a bed to a chair (including a wheelchair)?: A Little Help needed standing up from a chair using your arms (e.g., wheelchair or bedside chair)?: A Little Help needed to walk in hospital room?: A Little Help needed climbing 3-5 steps with a railing? : A Little 6 Click Score: 18    End of Session Equipment Utilized During Treatment: Gait belt Activity Tolerance: Patient tolerated treatment well Patient left: in bed;with call bell/phone within reach;with nursing/sitter in room (bed in upright position and set-up with food tray. nurse in the room) Nurse Communication: Mobility status PT Visit Diagnosis: Unsteadiness on feet (R26.81);Muscle weakness (generalized) (M62.81);Difficulty in walking, not elsewhere classified (R26.2)     Time: 0141-0301 PT Time Calculation (min) (ACUTE ONLY): 23 min  Charges:  $Gait Training: 8-22  mins $Therapeutic Activity: 8-22 mins                     Randall [name], PT, MPT    Randall [name] 01/02/2022, 10:22 AM

## 2022-01-02 NOTE — Progress Notes (Signed)
Progress Note   Patient: Randall [name] NOB:[id number] DOB: 09-18-1961 DOA: 12/22/2021     9 DOS: the patient was seen and examined on 01/02/2022   Brief hospital course: Taken from prior notes.  RUAIRI STUTSMAN is a 61 y.o. male with a PMH significant for dementia, HTN, HFpEF, CKD 3a, CVA. They presented from home to the ED on 12/22/2021 with intractable vomiting which has been ongoing for weeks. He was recently admitted on 2/3 with similar symptoms and had a mostly unremarkable work up. In the ED, it was found that they had normal vital signs, mild leukocytosis, AKI. CT abdomen showed constipation. Otherwise unremarkable labs.  They were treated with supportive care including IV fluids and antiemetics.   Due to persistent nausea and vomiting, GI was consulted and patient underwent EGD yesterday on 12/26/2021 which shows esophagitis, gastritis and few nonbleeding erosions involving first part of duodenum.  Biopsies were taken.  Due to worsening renal function nephrology decided to start dialysis, HD catheter was placed by vascular surgery and patient will receive his first session today.  2/22.  Patient receives second session of dialysis today.  Most likely will need permanent HD catheter and outpatient dialysis chair before discharge. Remained oliguric.  2/23: Patient was seen after the third session of dialysis today.  He appears little more confused than his baseline.  He was slow and able to just tell me his name.  Permanent catheter will be placed tomorrow by vascular surgery.  Nephrology started the procedure for outpatient dialysis chair.  2/24: Permanent catheter was placed by vascular surgery earlier today.  Tolerated the procedure well.  Plan is for another session of dialysis later today.  Waiting for outpatient dialysis chair. PT is recommending home health.  2/25: Awaiting outpatient dialysis chair.  2/26: Hypokalemia resolved.  Started on hydralazine for persistently  elevated blood pressure. We will get dialysis tomorrow, hopefully to get some news about outpatient dialysis chair tomorrow.  2/27: Started making more urine, about 1400 recorded.  Nephrology decided to hold dialysis today, will repeat renal function tomorrow.  Still waiting for outpatient HD chair   Assessment and Plan: * Recurrent vomiting- (present on admission) Improved. Recurrent episodes with second hospitalization in 2 weeks for the same No definite etiology identified. GI was consulted and patient underwent EGD on 12/26/2020 which shows esophagitis, some esophageal dysmotility, gastritis and few nonbleeding erosions in duodenum, biopsies were taken. -Continue with Protonix -Nutritionist evaluation   Acute renal failure superimposed on stage 3a chronic kidney disease ([place]) Urinary output improved today. Worsening renal function despite gentle IV hydration. Nephrology was consulted and they decided to proceed with hemodialysis.  Received 3 sessions of initial dialysis with temporary catheter which was then switched with permanent cath. -Nephrology decided to hold dialysis today. -Monitor renal function -Avoid nephrotoxins  Chronic systolic CHF (congestive heart failure) ([place])- (present on admission) Currently euvolemic -Continue Coreg, Carvedilol  -We will restart Entresto as patient is most likely ESRD now  Dementia ([place])- (present on admission) Continue Namenda and sertraline  History of CVA (cerebrovascular accident) Continue aspirin and statin  Moderate major depression ([place]) - Continue Abilify and Zoloft  Essential hypertension- (present on admission) Blood pressure remained elevated. -Continue amlodipine and Entresto -Add hydralazine 25 mg every 8 hourly -Continue to monitor  Leukocytosis Likely secondary to vomiting Acute infection not suspected   Subjective: Patient was seen and examined today.  No new complaints.  Sister at bedside.  Physical  Exam: Vitals:   01/01/22 2206  01/02/22 0543 01/02/22 0820 01/02/22 0840  BP: 137/80 (!) 105/58 (!) 155/74 115/61  Pulse: 80 86 79 84  Resp: 20 18 18 18   Temp: 97.7 F (36.5 C) 98.1 F (36.7 C) 98.2 F (36.8 C) 97.8 F (36.6 C)  TempSrc:  Oral    SpO2: 100% 100% 99% 99%  Weight:      Height:       General.  In no acute distress. Pulmonary.  Lungs clear bilaterally, normal respiratory effort. CV.  Regular rate and rhythm, no JVD, rub or murmur. Abdomen.  Soft, nontender, nondistended, BS positive. CNS.  Alert and oriented .  No focal neurologic deficit. Extremities.  No edema, no cyanosis, pulses intact and symmetrical. Psychiatry.  Judgment and insight appears normal.  Data Reviewed: Prior notes and labs reviewed.  Family Communication: Discussed with sister at bedside  Disposition: Status is: Inpatient Remains inpatient appropriate because: Severity of illness.  Awaiting outpatient HD chair.   Planned Discharge Destination: Home with Home Health  DVT prophylaxis.  Subcu heparin  Time spent: 38 minutes  This record has been created using Systems analyst. Errors have been sought and corrected,but may not always be located. Such creation errors do not reflect on the standard of care.  Author: Lorella Nimrod, MD 01/02/2022 3:22 PM  For on call review [email or web address].

## 2022-01-02 NOTE — Progress Notes (Signed)
Occupational Therapy Treatment Patient Details Name: Randall [name] MRN: [id number] DOB: September 23, 1961 Today's Date: 01/02/2022   History of present illness Randall [name] is a 61 y.o. male seen for evaluation of recurrent vomiting at the request of Dr. Judd Gaudier. Patient has a PMH of dementia, hypertension, diabetes, chronic kidney disease, CHF.  Patient presented to the [facility] ED for chief complaint of nausea, vomiting and weakness. Upon presentation to the ED yesterday, vital signs were stable with blood pressure 120/72, heart rate 67, respirations 18, temperature 98.7, pulse oximetry 97%. Labs were significant for new AKI with Cr 3.5, normal electrolytes GFR 19, glucose 176, WBC 17.9, UA unremarkable.  Imaging studies revealed CT of the abdomen pelvis revealed a possible tiny hiatal hernia, constipation, otherwise no acute intra-abdominal or intrapelvic abnormality.  Aortic atherosclerosis.  He is being admitted for IV hydration.  GI is consulted for evaluation and management of recurrent vomiting.   OT comments  Upon entering the room, pt seated on EOB with no c/o pain but attempting to use urinal. Pt agreeable to ambulate to bathroom for toileting needs. Pt ambulating with RW and close supervision into bathroom. Once standing unsupported and managing clothing pt needing min guard for balance. Pt washing hands at sink with increased time and supervision to complete task. Pt ambulates 180' with min guard progressing to close supervision without use of AD. Pt returning to sit on EOB to finish lunch with family present in the room. All needs within reach.    Recommendations for follow up therapy are one component of a multi-disciplinary discharge planning process, led by the attending physician.  Recommendations may be updated based on patient status, additional functional criteria and insurance authorization.    Follow Up Recommendations  Home health OT    Assistance Recommended at Discharge  Frequent or constant Supervision/Assistance  Patient can return home with the following  A little help with walking and/or transfers;Assistance with cooking/housework;Assist for transportation;Direct supervision/assist for medications management;Direct supervision/assist for financial management;Help with stairs or ramp for entrance;A little help with bathing/dressing/bathroom   Equipment Recommendations  BSC/3in1       Precautions / Restrictions Precautions Precautions: Fall       Mobility             Transfers Overall transfer level: Needs assistance Equipment used: Rolling walker (2 wheels), 1 person hand held assist Transfers: Sit to/from Stand Sit to Stand: Supervision           General transfer comment: supervision for safety, no loss of balance noted     Balance Overall balance assessment: Needs assistance Sitting-balance support: No upper extremity supported, Feet supported Sitting balance-Leahy Scale: Good Sitting balance - Comments: good static and dynamic balance during bathing/dressing   Standing balance support: Bilateral upper extremity supported Standing balance-Leahy Scale: Fair Standing balance comment: no loss of balance with UE supported on rolling walker                           ADL either performed or assessed with clinical judgement   ADL Overall ADL's : Needs assistance/impaired                         Toilet Transfer: Supervision/safety;Rolling walker (2 wheels)                  Extremity/Trunk Assessment Upper Extremity Assessment LUE Deficits / Details: hx chronic shoulder pain, limiting shoulder flexion  to less than 45* AROM            Vision Patient Visual Report: No change from baseline            Cognition Arousal/Alertness: Awake/alert Behavior During Therapy: WFL for tasks assessed/performed Overall Cognitive Status: Within Functional Limits for tasks assessed                                  General Comments: patient is able to follow all commands with increased time, cooperative throughout session                   Pertinent Vitals/ Pain       Pain Assessment Pain Assessment: No/denies pain         Frequency  Min 2X/week        Progress Toward Goals  OT Goals(current goals can now be found in the care plan section)  Progress towards OT goals: Progressing toward goals  Acute Rehab OT Goals Patient Stated Goal: to go home OT Goal Formulation: With patient/family Time For Goal Achievement: 01/06/22 Potential to Achieve Goals: Good  Plan Discharge plan remains appropriate;Frequency remains appropriate       AM-PAC OT "6 Clicks" Daily Activity     Outcome Measure   Help from another person eating meals?: None Help from another person taking care of personal grooming?: None Help from another person toileting, which includes using toliet, bedpan, or urinal?: A Little Help from another person bathing (including washing, rinsing, drying)?: A Little Help from another person to put on and taking off regular upper body clothing?: None Help from another person to put on and taking off regular lower body clothing?: A Little 6 Click Score: 21    End of Session Equipment Utilized During Treatment: Rolling walker (2 wheels)  OT Visit Diagnosis: Other abnormalities of gait and mobility (R26.89);Pain Pain - Right/Left: Left Pain - part of body: Shoulder   Activity Tolerance Patient tolerated treatment well   Patient Left in bed;with call bell/phone within reach;with bed alarm set;with family/visitor present   Nurse Communication Mobility status        Time: 9811-9147 OT Time Calculation (min): 11 min  Charges: OT General Charges $OT Visit: 1 Visit OT Treatments $Self Care/Home Management : 8-22 mins  Darleen Crocker, MS, OTR/L , CBIS ascom [phone]  01/02/22, 2:21 PM

## 2022-01-02 NOTE — TOC Progression Note (Signed)
Transition of Care [facility]) - Progression Note    Patient Details  Name: Randall [name] MRN: [id number] Date of Birth: September 02, 1961  [facility]) CM/SW Contact  Beverly Sessions, RN Phone Number: 01/02/2022, 2:39 PM  Clinical Narrative:    Nephrology monitoring for renal recovery.  HD coordinator still working on outpatient HD chair time         Expected Discharge Plan and Services                           DME Arranged: 3-N-1, Walker rolling DME Agency: [employer] Date DME Agency Contacted: 12/24/21 Time DME Agency Contacted: [phone] Representative spoke with at DME Agency: [place] ([place]) Interventions    Readmission Risk Interventions No flowsheet data found.

## 2022-01-02 NOTE — Progress Notes (Signed)
Pt is having difficulty swallowing his medicine whole, hence opted to crushed his meds with pudding. After this nurse gave his meds, pt started gagging and dry-retching. Gave him water several times to wash the bitter taste of the medicine.  22:40= Pt called and wanted to be assisted to go to the restroom to urinate. Pt ambulate well. When pt is back to bed; pt trying to gag and vomiting out his saliva. Pt said help him to swallow. Pt is shaking and complaining that his hands are numb. Pt appears to be confused; started to repeat words, singing and both hands where up in air and pt said he was praising God. V/S checked and stable. Alert and orientedx4. Denies any pain or SOB. All limbs able to move without issues. Oncall provider informed and saw pt. Family informed as well.

## 2022-01-02 NOTE — Progress Notes (Addendum)
°[facility]  °ROUNDING NOTE  ° °Subjective:  °Pt is admitted to [facility] on 12/22/2021 with intractable vomiting on 12/22/2021 with intractable vomiting which has been going for weeks.  He noted to have mild leukocytosis and AKI on admission patient has a past medical history significant for dementia, hypertension, HFpEF, CKD 3a and CVA.  We were asked to see the patient for AKI with a significantly elevated BUN and creatinine ° °Patient seen resting quietly °Alert and oriented °Tolerating meals without nausea and vomiting °Denies shortness of breath °Patient seen later in morning with sister at bedside ° °Creatinine remains slightly elevated at 6.3. °Urine output recorded of 1.4 L in 24 hours ° °Objective:  °Vital signs in last 24 hours:  °Temp:  [97.7 °F (36.5 °C)-98.3 °F (36.8 °C)] 97.8 °F (36.6 °C) (02/27 0840) °Pulse Rate:  [66-86] 84 (02/27 0840) °Resp:  [18-20] 18 (02/27 0840) °BP: (105-155)/(58-80) 115/61 (02/27 0840) °SpO2:  [99 %-100 %] 99 % (02/27 0840) ° °Weight change:  °Filed Weights  ° 12/30/21 0804 12/30/21 1455 12/30/21 2000  °Weight: 79.5 kg 78.8 kg 77.2 kg  ° ° °Intake/Output: °I/O last 3 completed shifts: °In: 240 [P.O.:240] °Out: 1800 [Urine:1800] °  °Intake/Output this shift: ° No intake/output data recorded. ° °Physical Exam: °General: NAD  °Head: Normocephalic, atraumatic. Moist oral mucosal membranes  °Eyes: Anicteric  °Lungs:  Clear to auscultation, normal effort  °Heart: S1S2 no rubs  °Abdomen:  Soft, nontender, bowel sounds present  °Extremities:  No peripheral edema.  °Neurologic: Alert following simple commands  °Skin: No acute rash  °Access: Right femoral HD temp cath, Rt IJ permcath placed by Dr Dew on 12/30/21  ° ° °Basic Metabolic Panel: °Recent Labs  °Lab 12/28/21 °0421 12/29/21 °0445 12/30/21 °0439 12/31/21 °0505 01/01/22 °0404  °NA 139 138 137 134* 136  °K 3.4* 3.2* 3.2* 3.0* 3.5  °CL 106 102 103 98 97*  °CO2 21* 25 25 27 29  °GLUCOSE 99 107* 94 95 102*  °BUN 46* 33* 33*  20 24*  °CREATININE 7.45* 6.94* 7.33* 5.08* 6.33*  °CALCIUM 8.1* 7.9* 7.8* 7.8* 8.1*  °MG  --   --   --  1.7  --   °PHOS 4.1 3.0 3.7 2.3* 3.2  ° ° ° °Liver Function Tests: °Recent Labs  °Lab 12/28/21 °0421 12/29/21 °0445 12/30/21 °0439 12/31/21 °0505 01/01/22 °0404  °ALBUMIN 2.6* 2.7* 2.7* 2.6* 2.6*  ° ° °No results for input(s): LIPASE, AMYLASE in the last 168 hours. ° °No results for input(s): AMMONIA in the last 168 hours. ° °CBC: °Recent Labs  °Lab 12/27/21 °0409  °WBC 9.9  °HGB 12.7*  °HCT 37.3*  °MCV 93.0  °PLT 209  ° ° ° °Cardiac Enzymes: °No results for input(s): CKTOTAL, CKMB, CKMBINDEX, TROPONINI in the last 168 hours. ° °BNP: °Invalid input(s): POCBNP ° °CBG: °Recent Labs  °Lab 12/26/21 °1402 12/26/21 °1442 12/29/21 °0421 01/01/22 °2247 01/02/22 °0837  °GLUCAP 97 108* 106* 117* 90  ° ° ° °Microbiology: °Results for orders placed or performed during the hospital encounter of 12/22/21  °Resp Panel by RT-PCR (Flu A&B, Covid) Nasopharyngeal Swab     Status: None  ° Collection Time: 12/22/21 11:18 PM  ° Specimen: Nasopharyngeal Swab; Nasopharyngeal(NP) swabs in vial transport medium  °Result Value Ref Range Status  ° SARS Coronavirus 2 by RT PCR NEGATIVE NEGATIVE Final  °  Comment: (NOTE) °SARS-CoV-2 target nucleic acids are NOT DETECTED. ° °The SARS-CoV-2 RNA is generally detectable in upper respiratory °specimens during   during the acute phase of infection. The lowest concentration of SARS-CoV-2 viral copies this assay can detect is 138 copies/mL. A negative result does not preclude SARS-Cov-2 infection and should not be used as the sole basis for treatment or other patient management decisions. A negative result may occur with  improper specimen collection/handling, submission of specimen other than nasopharyngeal swab, presence of viral mutation(s) within the areas targeted by this assay, and inadequate number of viral copies(<138 copies/mL). A negative result must be combined with clinical observations,  patient history, and epidemiological information. The expected result is Negative.  Fact Sheet for Patients:  EntrepreneurPulse.com.au  Fact Sheet for Healthcare Providers:  IncredibleEmployment.be  This test is no t yet approved or cleared by the [place] FDA and  has been authorized for detection and/or diagnosis of SARS-CoV-2 by FDA under an Emergency Use Authorization (EUA). This EUA will remain  in effect (meaning this test can be used) for the duration of the COVID-19 declaration under Section 564(b)(1) of the Act, 21 U.S.C.section 360bbb-3(b)(1), unless the authorization is terminated  or revoked sooner.       Influenza A by PCR NEGATIVE NEGATIVE Final   Influenza B by PCR NEGATIVE NEGATIVE Final    Comment: (NOTE) The Xpert Xpress SARS-CoV-2/FLU/RSV plus assay is intended as an aid in the diagnosis of influenza from Nasopharyngeal swab specimens and should not be used as a sole basis for treatment. Nasal washings and aspirates are unacceptable for Xpert Xpress SARS-CoV-2/FLU/RSV testing.  Fact Sheet for Patients: EntrepreneurPulse.com.au  Fact Sheet for Healthcare Providers: IncredibleEmployment.be  This test is not yet approved or cleared by the [place] FDA and has been authorized for detection and/or diagnosis of SARS-CoV-2 by FDA under an Emergency Use Authorization (EUA). This EUA will remain in effect (meaning this test can be used) for the duration of the COVID-19 declaration under Section 564(b)(1) of the Act, 21 U.S.C. section 360bbb-3(b)(1), unless the authorization is terminated or revoked.  Performed at [facility], [address] [place], [place] [address]     Coagulation Studies: No results for input(s): LABPROT, INR in the last 72 hours.  Urinalysis: No results for input(s): COLORURINE, LABSPEC, PHURINE, GLUCOSEU, HGBUR, BILIRUBINUR, KETONESUR,  PROTEINUR, UROBILINOGEN, NITRITE, LEUKOCYTESUR in the last 72 hours.  Invalid input(s): APPERANCEUR     Imaging: No results found.   Medications:      amLODipine  10 mg Oral Daily   ARIPiprazole  1 mg Oral Daily   aspirin EC  81 mg Oral Daily   carvedilol  25 mg Oral BID WC   Chlorhexidine Gluconate Cloth  6 each Topical Q0600   diclofenac Sodium  2 g Topical QID   heparin injection (subcutaneous)  5,000 Units Subcutaneous Q8H   hydrALAZINE  25 mg Oral Q8H   pantoprazole  40 mg Oral BID   polyethylene glycol  17 g Oral Daily   rosuvastatin  10 mg Oral Daily   sacubitril-valsartan  1 tablet Oral BID   sertraline  100 mg Oral Daily   sodium bicarbonate  650 mg Oral TID   tamsulosin  0.4 mg Oral Daily   acetaminophen **OR** acetaminophen, hydrALAZINE, HYDROmorphone (DILAUDID) injection, ondansetron **OR** ondansetron (ZOFRAN) IV, ondansetron (ZOFRAN) IV, sorbitol, milk of mag, mineral oil, glycerin (SMOG) enema  Assessment/ Plan:  61 y.o. male with a past medical history significant for dementia, hypertension, heart failure, CKD 3a and CVA admitted to [facility] on 12/22/2021 with intractable vomiting.  Also noted to have mild leukocytosis and  1 AKI on CKD 3a with baseline creatinine of 1.4 and EGFR of 52.  Patient's latest BUN/creatinine are 50 and 6.94 respectively.  It is felt that acute kidney injury could have been contributed from usage of Advil/NSAIDs and from dehydration secondary to intractable vomiting.   °Originally scheduled to receive dialysis today, however with increased urine output reported.  We will hold dialysis today and monitor for renal recovery.  We will determine with labs and presentation tomorrow if dialysis is needed.  Dialysis coordinator continues to work on outpatient placement in preparation for continued treatments.  Instructed nursing to discontinue HD temp cath. ° °#2 recurrent vomiting likely multifactorial including advancing dementia ,medication  side effect or functional dyspepsia.  GI is consulted.  EGD completed with no signs of active bleeding.  Did see gastritis, small hiatal hernia and LA grade B reflux esophagitis.  Concerning areas biopsied. ° Denies nausea and vomiting °  ° °3. Anemia of chronic kidney disease ° Normocytic °Lab Results  °Component Value Date  ° HGB 12.7 (L) 12/27/2021  °Hgb at goal ° ° LOS: 9 °  °2/27/20231:36 PM °  °

## 2022-01-02 NOTE — Assessment & Plan Note (Signed)
Urinary output improved today. Worsening renal function despite gentle IV hydration. Nephrology was consulted and they decided to proceed with hemodialysis.  Received 3 sessions of initial dialysis with temporary catheter which was then switched with permanent cath. -Nephrology decided to hold dialysis today. -Monitor renal function -Avoid nephrotoxins

## 2022-01-03 DIAGNOSIS — R1110 Vomiting, unspecified: Secondary | ICD-10-CM | POA: Diagnosis not present

## 2022-01-03 LAB — RENAL FUNCTION PANEL
Albumin: 2.6 g/dL — ABNORMAL LOW (ref 3.5–5.0)
Anion gap: 7 (ref 5–15)
BUN: 30 mg/dL — ABNORMAL HIGH (ref 6–20)
CO2: 29 mmol/L (ref 22–32)
Calcium: 8 mg/dL — ABNORMAL LOW (ref 8.9–10.3)
Chloride: 101 mmol/L (ref 98–111)
Creatinine, Ser: 7.54 mg/dL — ABNORMAL HIGH (ref 0.61–1.24)
GFR, Estimated: 8 mL/min — ABNORMAL LOW (ref >60)
Glucose, Bld: 126 mg/dL — ABNORMAL HIGH (ref 70–99)
Phosphorus: 3.5 mg/dL (ref 2.5–4.6)
Potassium: 3.2 mmol/L — ABNORMAL LOW (ref 3.5–5.1)
Sodium: 137 mmol/L (ref 135–145)

## 2022-01-03 MED ORDER — POTASSIUM CHLORIDE CRYS ER 20 MEQ PO TBCR
40.0000 meq | EXTENDED_RELEASE_TABLET | Freq: Once | ORAL | Status: AC
  Administered 2022-01-03: 40 meq via ORAL
  Filled 2022-01-03: qty 2

## 2022-01-03 NOTE — Progress Notes (Signed)
Progress Note   Patient: Randall [name] WCB:[id number] DOB: [birth year] DOA: 12/22/2021     10 DOS: the patient was seen and examined on 01/03/2022   Brief hospital course: Taken from prior notes.  AUBURN HERT is a 61 y.o. male with a PMH significant for dementia, HTN, HFpEF, CKD 3a, CVA. They presented from home to the ED on 12/22/2021 with intractable vomiting which has been ongoing for weeks. He was recently admitted on 2/3 with similar symptoms and had a mostly unremarkable work up. In the ED, it was found that they had normal vital signs, mild leukocytosis, AKI. CT abdomen showed constipation. Otherwise unremarkable labs.  They were treated with supportive care including IV fluids and antiemetics.   Due to persistent nausea and vomiting, GI was consulted and patient underwent EGD yesterday on 12/26/2021 which shows esophagitis, gastritis and few nonbleeding erosions involving first part of duodenum.  Biopsies were taken.  Due to worsening renal function nephrology decided to start dialysis, HD catheter was placed by vascular surgery and patient will receive his first session today.  2/22.  Patient receives second session of dialysis today.  Most likely will need permanent HD catheter and outpatient dialysis chair before discharge. Remained oliguric.  2/23: Patient was seen after the third session of dialysis today.  He appears little more confused than his baseline.  He was slow and able to just tell me his name.  Permanent catheter will be placed tomorrow by vascular surgery.  Nephrology started the procedure for outpatient dialysis chair.  2/24: Permanent catheter was placed by vascular surgery earlier today.  Tolerated the procedure well.  Plan is for another session of dialysis later today.  Waiting for outpatient dialysis chair. PT is recommending home health.  2/25: Awaiting outpatient dialysis chair.  2/26: Hypokalemia resolved.  Started on hydralazine for persistently  elevated blood pressure. We will get dialysis tomorrow, hopefully to get some news about outpatient dialysis chair tomorrow.  2/27: Started making more urine, about 1400 recorded.  Nephrology decided to hold dialysis today, will repeat renal function tomorrow.  Still waiting for outpatient HD chair.  2/28: Continue to have good urinary output.  Some worsening of creatinine today, nephrology is recommending holding Entresto and observing for next couple of days for some spontaneous renal function recovery.    Assessment and Plan: * Recurrent vomiting- (present on admission) Improved. Recurrent episodes with second hospitalization in 2 weeks for the same No definite etiology identified. GI was consulted and patient underwent EGD on 12/26/2020 which shows esophagitis, some esophageal dysmotility, gastritis and few nonbleeding erosions in duodenum, biopsies were taken. -Continue with Protonix -Nutritionist evaluation   Acute renal failure superimposed on stage 3a chronic kidney disease ([place]) Good urinary output today. Worsening renal function despite gentle IV hydration. Nephrology was consulted and they decided to proceed with hemodialysis.  Received 3 sessions of initial dialysis with temporary catheter which was then switched with permanent cath. -Nephrology decided to hold dialysis today again, they were advising to hold [facility] and monitor for the next couple of days. -Monitor renal function -Avoid nephrotoxins  Chronic systolic CHF (congestive heart failure) ([place])- (present on admission) Currently euvolemic -Continue Coreg, Carvedilol  -We will restart Entresto as patient is most likely ESRD now  Dementia ([place])- (present on admission) Continue Namenda and sertraline  History of CVA (cerebrovascular accident) Continue aspirin and statin  Moderate major depression ([place]) - Continue Abilify and Zoloft  Essential hypertension- (present on admission) Blood pressure improved  after adding  hydralazine. -Hold Entresto as advised by nephrology while waiting for any renal function recovery. -Continue amlodipine  -Continue hydralazine, might have to increase the dose as we are holding Entresto now. -Continue to monitor  Leukocytosis Likely secondary to vomiting Acute infection not suspected   Subjective: Patient was seen and examined today.  No new complaint.  Continue to make good amount of urine.  Physical Exam: Vitals:   01/02/22 1552 01/02/22 1932 01/03/22 0548 01/03/22 0748  BP: (!) 175/69 (!) 141/61 134/68 135/71  Pulse: 69 74 75 78  Resp: 18 16 18 16   Temp: 98 F (36.7 C) 98.5 F (36.9 C) 98.7 F (37.1 C) 99 F (37.2 C)  TempSrc:      SpO2: 100% 99% 98% 100%  Weight:      Height:       General.  In no acute distress. Pulmonary.  Lungs clear bilaterally, normal respiratory effort. CV.  Regular rate and rhythm, no JVD, rub or murmur. Abdomen.  Soft, nontender, nondistended, BS positive. CNS.  Alert and oriented .  No focal neurologic deficit. Extremities.  No edema, no cyanosis, pulses intact and symmetrical. Psychiatry.  Judgment and insight appears normal.  Data Reviewed: Prior notes and labs reviewed.  Family Communication:   Disposition: Status is: Inpatient Remains inpatient appropriate because: Severity of illness.  Nephrology wants to observe for the next couple of days and started making urine.  Still waiting for outpatient HD chair.   Planned Discharge Destination: Home with Home Health  DVT prophylaxis.  Subcu heparin  Time spent: 40 minutes  This record has been created using Systems analyst. Errors have been sought and corrected,but may not always be located. Such creation errors do not reflect on the standard of care.  Author: Lorella Nimrod, MD 01/03/2022 1:39 PM  For on call review [email or web address].

## 2022-01-03 NOTE — Plan of Care (Signed)
Femoral temp dialysis catheter removed per order placed by IV team. No further complication noted at the site. Dressing remains c/d/I  Problem: Education: Goal: Knowledge of General Education information will improve Description: Including pain rating scale, medication(s)/side effects and non-pharmacologic comfort measures Outcome: Progressing   Problem: Health Behavior/Discharge Planning: Goal: Ability to manage health-related needs will improve Outcome: Progressing   Problem: Clinical Measurements: Goal: Ability to maintain clinical measurements within normal limits will improve Outcome: Progressing Goal: Will remain free from infection Outcome: Progressing   Problem: Activity: Goal: Risk for activity intolerance will decrease Outcome: Progressing   Problem: Coping: Goal: Level of anxiety will decrease Outcome: Progressing

## 2022-01-03 NOTE — Progress Notes (Signed)
Reassessed R femoral [employer] removal site. Dressing c/d/I and no bleeding noted.

## 2022-01-03 NOTE — TOC Progression Note (Signed)
Transition of Care [facility]) - Progression Note    Patient Details  Name: Randall [name] MRN: [id number] Date of Birth: 01/16/1961  [facility]) CM/SW Contact  Beverly Sessions, RN Phone Number: 01/03/2022, 1:35 PM  Clinical Narrative:      Updated sister with discharge plans. Plan remains for patient to discharge with home health services through [place]  Sister states that if patient needs outpatient HD she will transport him      Expected Discharge Plan and Services                           DME Arranged: 3-N-1, [place] rolling DME Agency: [employer] Date DME Agency Contacted: 12/24/21 Time DME Agency Contacted: [phone] Representative spoke with at DME Agency: [place] ([place] Inter[facility]    Readmission Risk Interventions No flowsheet data found.

## 2022-01-03 NOTE — Progress Notes (Signed)
Randall [name] removed intact per order. Pressure held approx. 53mins. No bleeding. Vaseline gauze pressure dsg placed. Pt given instructions.

## 2022-01-03 NOTE — Progress Notes (Signed)
[facility]  ROUNDING NOTE   Subjective:  Pt is admitted to [facility] on 12/22/2021 with intractable vomiting on 12/22/2021 with intractable vomiting which has been going for weeks.  He noted to have mild leukocytosis and AKI on admission patient has a past medical history significant for dementia, hypertension, HFpEF, CKD 3a and CVA.  We were asked to see the patient for AKI with a significantly elevated BUN and creatinine  Patient seen resting quietly Sister at bedside Tolerating meals without nausea and vomiting Denies shortness of breath, remains on room air  Creatinine remains 7 UOP 1.2L in past 24 hours  Objective:  Vital signs in last 24 hours:  Temp:  [98 F (36.7 C)-99 F (37.2 C)] 99 F (37.2 C) (02/28 0748) Pulse Rate:  [69-78] 78 (02/28 0748) Resp:  [16-18] 16 (02/28 0748) BP: (134-175)/(61-71) 135/71 (02/28 0748) SpO2:  [98 %-100 %] 100 % (02/28 0748)  Weight change:  Filed Weights   12/30/21 0804 12/30/21 1455 12/30/21 2000  Weight: 79.5 kg 78.8 kg 77.2 kg    Intake/Output: I/O last 3 completed shifts: In: 200 [P.O.:200] Out: 2250 [Urine:2250]   Intake/Output this shift:  Total I/O In: 0  Out: 300 [Urine:300]  Physical Exam: General: NAD  Head: Normocephalic, atraumatic. Moist oral mucosal membranes  Eyes: Anicteric  Lungs:  Clear to auscultation, normal effort  Heart: S1S2 no rubs  Abdomen:  Soft, nontender, bowel sounds present  Extremities:  No peripheral edema.  Neurologic: Alert following simple commands  Skin: No acute rash  Access: Rt IJ permcath placed by Dr Lucky Cowboy on 91/79/15    Basic Metabolic Panel: Recent Labs  Lab 12/29/21 0445 12/30/21 0439 12/31/21 0505 01/01/22 0404 01/03/22 0436  NA 138 137 134* 136 137  K 3.2* 3.2* 3.0* 3.5 3.2*  CL 102 103 98 97* 101  CO2 _0 GLUCOSE 107* 94 95 102* 126*  BUN 33* 33* 20 24* 30*  CREATININE 6.94* 7.33* 5.08* 6.33* 7.54*  CALCIUM 7.9* 7.8* 7.8* 8.1* 8.0*  MG  --   --   1.7  --   --   PHOS 3.0 3.7 2.3* 3.2 3.5     Liver Function Tests: Recent Labs  Lab 12/29/21 0445 12/30/21 0439 12/31/21 0505 01/01/22 0404 01/03/22 0436  ALBUMIN 2.7* 2.7* 2.6* 2.6* 2.6*    No results for input(s): LIPASE, AMYLASE in the last 168 hours.  No results for input(s): AMMONIA in the last 168 hours.  CBC: No results for input(s): WBC, NEUTROABS, HGB, HCT, MCV, PLT in the last 168 hours.   Cardiac Enzymes: No results for input(s): CKTOTAL, CKMB, CKMBINDEX, TROPONINI in the last 168 hours.  BNP: Invalid input(s): POCBNP  CBG: Recent Labs  Lab 12/29/21 0421 01/01/22 2247 01/02/22 0837  GLUCAP 106* 117* 90     Microbiology: Results for orders placed or performed during the hospital encounter of 12/22/21  Resp Panel by RT-PCR (Flu A&B, Covid) Nasopharyngeal Swab     Status: None   Collection Time: 12/22/21 11:18 PM   Specimen: Nasopharyngeal Swab; Nasopharyngeal(NP) swabs in vial transport medium  Result Value Ref Range Status   SARS Coronavirus 2 by RT PCR NEGATIVE NEGATIVE Final    Comment: (NOTE) SARS-CoV-2 target nucleic acids are NOT DETECTED.  The SARS-CoV-2 RNA is generally detectable in upper respiratory specimens during the acute phase of infection. The lowest concentration of SARS-CoV-2 viral copies this assay can detect is 138 copies/mL. A negative result does not preclude SARS-Cov-2  infection and should not be used as the sole basis for treatment or other patient management decisions. A negative result may occur with  improper specimen collection/handling, submission of specimen other than nasopharyngeal swab, presence of viral mutation(s) within the areas targeted by this assay, and inadequate number of viral copies(<138 copies/mL). A negative result must be combined with clinical observations, patient history, and epidemiological information. The expected result is Negative.  Fact Sheet for Patients:   EntrepreneurPulse.com.au  Fact Sheet for Healthcare Providers:  IncredibleEmployment.be  This test is no t yet approved or cleared by the [place] FDA and  has been authorized for detection and/or diagnosis of SARS-CoV-2 by FDA under an Emergency Use Authorization (EUA). This EUA will remain  in effect (meaning this test can be used) for the duration of the COVID-19 declaration under Section 564(b)(1) of the Act, 21 U.S.C.section 360bbb-3(b)(1), unless the authorization is terminated  or revoked sooner.       Influenza A by PCR NEGATIVE NEGATIVE Final   Influenza B by PCR NEGATIVE NEGATIVE Final    Comment: (NOTE) The Xpert Xpress SARS-CoV-2/FLU/RSV plus assay is intended as an aid in the diagnosis of influenza from Nasopharyngeal swab specimens and should not be used as a sole basis for treatment. Nasal washings and aspirates are unacceptable for Xpert Xpress SARS-CoV-2/FLU/RSV testing.  Fact Sheet for Patients: EntrepreneurPulse.com.au  Fact Sheet for Healthcare Providers: IncredibleEmployment.be  This test is not yet approved or cleared by the [place] FDA and has been authorized for detection and/or diagnosis of SARS-CoV-2 by FDA under an Emergency Use Authorization (EUA). This EUA will remain in effect (meaning this test can be used) for the duration of the COVID-19 declaration under Section 564(b)(1) of the Act, 21 U.S.C. section 360bbb-3(b)(1), unless the authorization is terminated or revoked.  Performed at [facility], [address] [place], [place] [address]     Coagulation Studies: No results for input(s): LABPROT, INR in the last 72 hours.  Urinalysis: No results for input(s): COLORURINE, LABSPEC, PHURINE, GLUCOSEU, HGBUR, BILIRUBINUR, KETONESUR, PROTEINUR, UROBILINOGEN, NITRITE, LEUKOCYTESUR in the last 72 hours.  Invalid input(s): APPERANCEUR      Imaging: No results found.   Medications:      amLODipine  10 mg Oral Daily   ARIPiprazole  1 mg Oral Daily   aspirin EC  81 mg Oral Daily   carvedilol  25 mg Oral BID WC   Chlorhexidine Gluconate Cloth  6 each Topical Q0600   diclofenac Sodium  2 g Topical QID   feeding supplement (NEPRO CARB STEADY)  237 mL Oral TID BM   heparin injection (subcutaneous)  5,000 Units Subcutaneous Q8H   hydrALAZINE  25 mg Oral Q8H   multivitamin  1 tablet Oral QHS   pantoprazole  40 mg Oral BID   polyethylene glycol  17 g Oral Daily   rosuvastatin  10 mg Oral Daily   sertraline  100 mg Oral Daily   sodium bicarbonate  650 mg Oral TID   tamsulosin  0.4 mg Oral Daily   acetaminophen **OR** acetaminophen, hydrALAZINE, HYDROmorphone (DILAUDID) injection, ondansetron **OR** ondansetron (ZOFRAN) IV, ondansetron (ZOFRAN) IV, sorbitol, milk of mag, mineral oil, glycerin (SMOG) enema  Assessment/ Plan:  61 y.o. male with a past medical history significant for dementia, hypertension, heart failure, CKD 3a and CVA admitted to [facility] on 12/22/2021 with intractable vomiting.  Also noted to have mild leukocytosis and AKI.   #1 AKI on CKD 3a with baseline creatinine of 1.4 and  EGFR of 52.  Patient's latest BUN/creatinine are 50 and 6.94 respectively.  It is felt that acute kidney injury could have been contributed from usage of Advil/NSAIDs and from dehydration secondary to intractable vomiting.   Will continue holding dialysis today due to adequate urine output and stable labs. Awaiting Creatinine to decrease, but BUN stable. Will continue to monitor and assess need for HD daily.   #2 recurrent vomiting likely multifactorial including advancing dementia ,medication side effect or functional dyspepsia.  GI is consulted.  EGD completed with no signs of active bleeding.  Did see gastritis, small hiatal hernia and LA grade B reflux esophagitis.  Concerning areas biopsied.  Tolerating meals    3. Anemia of  chronic kidney disease  Normocytic Lab Results  Component Value Date   HGB 12.7 (L) 12/27/2021  Hgb remains at goal   LOS: [place] 2/28/20232:12 PM

## 2022-01-03 NOTE — Progress Notes (Addendum)
Mobility Specialist - Progress Note   01/03/22 1100  Mobility  Activity Ambulated with assistance in hallway  Level of Assistance Minimal assist, patient does 75% or more  Assistive Device Front wheel walker  Distance Ambulated (ft) 50 ft  Activity Response Tolerated well  $Mobility charge 1 Mobility    Pt sleeping in bed upon arrival, utilizing RA. Pt fatigued this date, but agreeable to light activity. Pt sat EOB modI---cold wash cloth provided for face for further arousal. Pt ambulated in hallway with supervision, but does require verbal/tactile assistance at times d/t pt almost colliding directly into wall x3. VC to keep RW on floor as he lifts front wheels during turns. Unsure if directional cues are d/t vision impairment (pt states he is supposed to wear glasses only when driving) or if d/t lethargy. Pt returned to bed with needs in reach. RN notified.    Kathee Delton Mobility Specialist 01/03/22, 11:33 AM

## 2022-01-03 NOTE — Progress Notes (Signed)
Physical Therapy Treatment Patient Details Name: GUY SEESE MRN: [id number] DOB: 02-04-1961 Today's Date: 01/03/2022   History of Present Illness ALEXSANDER CAVINS is a 61 y.o. male seen for evaluation of recurrent vomiting at the request of Dr. Judd Gaudier. Patient has a PMH of dementia, hypertension, diabetes, chronic kidney disease, CHF.  Patient presented to the [facility] ED for chief complaint of nausea, vomiting and weakness. Upon presentation to the ED yesterday, vital signs were stable with blood pressure 120/72, heart rate 67, respirations 18, temperature 98.7, pulse oximetry 97%. Labs were significant for new AKI with Cr 3.5, normal electrolytes GFR 19, glucose 176, WBC 17.9, UA unremarkable.  Imaging studies revealed CT of the abdomen pelvis revealed a possible tiny hiatal hernia, constipation, otherwise no acute intra-abdominal or intrapelvic abnormality.  Aortic atherosclerosis.  He is being admitted for IV hydration.  GI is consulted for evaluation and management of recurrent vomiting.   PT Comments    Patient received in bed, appearing lethargic. Slow to respond. Agrees to PT session. Patient without specific complaints, when pressed, he agrees that he is just feeling tired and weak. He is independent with bed mobility, transfers with min guard and ambulated into bathroom to urinate and then requested to return to bed. ( Patient had ambulated with mobility spec. Earlier today)  Standing to urinate his legs appear shaky and wobbly. Patient will continue to benefit from skilled PT while here to improve mobility, strength and safety.       Recommendations for follow up therapy are one component of a multi-disciplinary discharge planning process, led by the attending physician.  Recommendations may be updated based on patient status, additional functional criteria and insurance authorization.  Follow Up Recommendations  Home health PT     Assistance Recommended at Discharge  Intermittent Supervision/Assistance  Patient can return home with the following A little help with walking and/or transfers;A little help with bathing/dressing/bathroom;Help with stairs or ramp for entrance;Assist for transportation;Direct supervision/assist for financial management;Direct supervision/assist for medications management   Equipment Recommendations  Rolling walker (2 wheels)    Recommendations for Other Services       Precautions / Restrictions Precautions Precautions: Fall Restrictions Weight Bearing Restrictions: No     Mobility  Bed Mobility Overal bed mobility: Modified Independent Bed Mobility: Supine to Sit, Sit to Supine     Supine to sit: Modified independent (Device/Increase time) Sit to supine: Modified independent (Device/Increase time)   General bed mobility comments: min guard, no physical assist needed    Transfers Overall transfer level: Needs assistance Equipment used: Rolling walker (2 wheels) Transfers: Sit to/from Stand Sit to Stand: Min guard           General transfer comment: min guard for safety as patient seems lethargic this session    Ambulation/Gait Ambulation/Gait assistance: Min guard Gait Distance (Feet): 25 Feet Assistive device: Rolling walker (2 wheels) Gait Pattern/deviations: Step-through pattern, Decreased stride length Gait velocity: decreased     General Gait Details: patient ambulated into bathroom with RW and back to bed, he seems very lethargic this session. Standing to urinate he legs are wobbly.   Stairs             Wheelchair Mobility    Modified Rankin (Stroke Patients Only)       Balance Overall balance assessment: Needs assistance Sitting-balance support: Feet supported Sitting balance-Leahy Scale: Good     Standing balance support: Bilateral upper extremity supported, Single extremity supported, During functional activity, Reliant on  assistive device for balance Standing  balance-Leahy Scale: Fair Standing balance comment: no loss of balance with UE supported on rolling walker                            Cognition Arousal/Alertness: Lethargic Behavior During Therapy: WFL for tasks assessed/performed, Flat affect Overall Cognitive Status: No family/caregiver present to determine baseline cognitive functioning                         Following Commands: Follows one step commands consistently Safety/Judgement: Decreased awareness of safety   Problem Solving: Slow processing, Decreased initiation, Requires verbal cues, Requires tactile cues General Comments: patient is able to follow all commands with increased time, cooperative throughout session        Exercises      General Comments        Pertinent Vitals/Pain Pain Assessment Pain Assessment: No/denies pain    Home Living                          Prior Function            PT Goals (current goals can now be found in the care plan section) Acute Rehab PT Goals Patient Stated Goal: to return home PT Goal Formulation: With patient Time For Goal Achievement: 01/06/22 Potential to Achieve Goals: Fair Progress towards PT goals: Progressing toward goals    Frequency    Min 2X/week      PT Plan Current plan remains appropriate    Co-evaluation              AM-PAC PT "6 Clicks" Mobility   Outcome Measure  Help needed turning from your back to your side while in a flat bed without using bedrails?: None Help needed moving from lying on your back to sitting on the side of a flat bed without using bedrails?: None Help needed moving to and from a bed to a chair (including a wheelchair)?: A Little Help needed standing up from a chair using your arms (e.g., wheelchair or bedside chair)?: A Little Help needed to walk in hospital room?: A Little Help needed climbing 3-5 steps with a railing? : A Little 6 Click Score: 20    End of Session Equipment  Utilized During Treatment: Gait belt Activity Tolerance: Patient limited by lethargy;Patient limited by fatigue Patient left: in bed;with call bell/phone within reach;with bed alarm set Nurse Communication: Mobility status PT Visit Diagnosis: Unsteadiness on feet (R26.81);Muscle weakness (generalized) (M62.81);Difficulty in walking, not elsewhere classified (R26.2)     Time: 9509-3267 PT Time Calculation (min) (ACUTE ONLY): 10 min  Charges:  $Gait Training: 8-22 mins                     Aamiyah Derrick, PT, GCS 01/03/22,1:50 PM

## 2022-01-03 NOTE — Assessment & Plan Note (Signed)
Good urinary output today. Worsening renal function despite gentle IV hydration. Nephrology was consulted and they decided to proceed with hemodialysis.  Received 3 sessions of initial dialysis with temporary catheter which was then switched with permanent cath. -Nephrology decided to hold dialysis today again, they were advising to hold [facility] and monitor for the next couple of days. -Monitor renal function -Avoid nephrotoxins

## 2022-01-03 NOTE — Assessment & Plan Note (Signed)
Blood pressure improved after adding hydralazine. -Hold Entresto as advised by nephrology while waiting for any renal function recovery. -Continue amlodipine  -Continue hydralazine, might have to increase the dose as we are holding Entresto now. -Continue to monitor

## 2022-01-04 DIAGNOSIS — R1110 Vomiting, unspecified: Secondary | ICD-10-CM | POA: Diagnosis not present

## 2022-01-04 LAB — CBC
HCT: 25.3 % — ABNORMAL LOW (ref 39.0–52.0)
Hemoglobin: 8.5 g/dL — ABNORMAL LOW (ref 13.0–17.0)
MCH: 32.4 pg (ref 26.0–34.0)
MCHC: 33.6 g/dL (ref 30.0–36.0)
MCV: 96.6 fL (ref 80.0–100.0)
Platelets: 191 10*3/uL (ref 150–400)
RBC: 2.62 MIL/uL — ABNORMAL LOW (ref 4.22–5.81)
RDW: 12.8 % (ref 11.5–15.5)
WBC: 13.5 10*3/uL — ABNORMAL HIGH (ref 4.0–10.5)
nRBC: 0 % (ref 0.0–0.2)

## 2022-01-04 LAB — BASIC METABOLIC PANEL WITH GFR
Anion gap: 9 (ref 5–15)
BUN: 33 mg/dL — ABNORMAL HIGH (ref 6–20)
CO2: 29 mmol/L (ref 22–32)
Calcium: 8.1 mg/dL — ABNORMAL LOW (ref 8.9–10.3)
Chloride: 99 mmol/L (ref 98–111)
Creatinine, Ser: 7.98 mg/dL — ABNORMAL HIGH (ref 0.61–1.24)
GFR, Estimated: 7 mL/min — ABNORMAL LOW
Glucose, Bld: 101 mg/dL — ABNORMAL HIGH (ref 70–99)
Potassium: 3.3 mmol/L — ABNORMAL LOW (ref 3.5–5.1)
Sodium: 137 mmol/L (ref 135–145)

## 2022-01-04 LAB — MAGNESIUM: Magnesium: 1.9 mg/dL (ref 1.7–2.4)

## 2022-01-04 MED ORDER — POTASSIUM CHLORIDE CRYS ER 20 MEQ PO TBCR
80.0000 meq | EXTENDED_RELEASE_TABLET | Freq: Once | ORAL | Status: DC
Start: 2022-01-04 — End: 2022-01-04

## 2022-01-04 MED ORDER — POTASSIUM CHLORIDE CRYS ER 20 MEQ PO TBCR
40.0000 meq | EXTENDED_RELEASE_TABLET | Freq: Once | ORAL | Status: AC
  Administered 2022-01-04: 40 meq via ORAL
  Filled 2022-01-04: qty 2

## 2022-01-04 NOTE — Progress Notes (Signed)
?  Progress Note ? ? ?Patient: Randall [name] SWH:[id number] DOB: 16-Aug-1961 DOA: 12/22/2021     11 ?DOS: the patient was seen and examined on 01/04/2022 ?  ?Brief hospital course: ?No notes on file ? ? ?Assessment and Plan: ?* Recurrent vomiting- (present on admission) ?Improved. ?Recurrent episodes with second hospitalization in 2 weeks for the same ?No definite etiology identified. ?GI was consulted and patient underwent EGD on 12/26/2020 which shows esophagitis, some esophageal dysmotility, gastritis and few nonbleeding erosions in duodenum, biopsies were taken, which were negative. Vomiting and nausea resolved, tolerating diet ?-Continue with Protonix ? ?Anemia ?Hgb 8s from 11s on admission. No hematochezia or melena or other bleeding. Likely 2/2 new renal dysfunction ?- monitor closely ? ?Hypokalemia ?3.2 today ?- kcl 40 ?- f/u mg ? ?Essential hypertension- (present on admission) ?Blood pressure improved after adding hydralazine. ?-Hold Entresto as advised by nephrology while waiting for any renal function recovery. ?-Continue amlodipine  ?-Continue hydralazine,  ?-Continue to monitor ? ?Moderate major depression ([place]) ?- Continue Abilify and Zoloft ? ?Acute renal failure superimposed on stage 3a chronic kidney disease ([place]) ?Good urinary output . ?Nephrology was consulted and they decided to proceed with hemodialysis.  Received 3 sessions of initial dialysis with temporary catheter which was then switched with permanent cath. ?-dialysis now on hold. Making good urine but gfr remains below 15. ?-Monitor renal function ?-Avoid nephrotoxins ? ?History of CVA (cerebrovascular accident) ?Continue aspirin and statin ? ?Chronic systolic CHF (congestive heart failure) ([place])- (present on admission) ?Currently euvolemic ?-Continue Coreg, Carvedilol  ?-holding entresto as above ? ?Dementia ([place])- (present on admission) ?Continue Namenda and sertraline ? ? ?Subjective: Patient was seen and examined today.  No new complaint.   Tolerating diet, no constipation, no pain ? ?Physical Exam: ?Vitals:  ? 01/03/22 2144 01/04/22 0428 01/04/22 0803 01/04/22 1504  ?BP: 135/64 118/63 (!) 144/71 116/72  ?Pulse: 68 79 73 69  ?Resp:  16 16 16   ?Temp:  99.1 ?F (37.3 ?C) 98.1 ?F (36.7 ?C) 98.2 ?F (36.8 ?C)  ?TempSrc:   Oral Oral  ?SpO2:  98% 98% 100%  ?Weight:      ?Height:      ? ?General.  In no acute distress. ?Pulmonary.  Lungs clear bilaterally, normal respiratory effort. ?CV.  Regular rate and rhythm, no JVD, rub or murmur. ?Abdomen.  Soft, nontender, nondistended, BS positive. ?CNS.  Alert and oriented .  No focal neurologic deficit. ?Extremities.  No edema, no cyanosis, pulses intact and symmetrical. ?Psychiatry.  Judgment and insight appears normal. ? ?Data Reviewed: ?Prior notes and labs reviewed. ? ?Family Communication:  ? ?Disposition: ?Status is: Inpatient ?Remains inpatient appropriate because: Severity of illness. May need outpt dialysis ? ? Planned Discharge Destination: Home with Home Health ? ?DVT prophylaxis.  Subcu heparin ? ?Time spent: 30 minutes ? ? ?Author: ?Desma Maxim, MD ?01/04/2022 3:42 PM ? ?For on call review [email or web address].  ? ?

## 2022-01-04 NOTE — Progress Notes (Signed)
[facility]  ROUNDING NOTE   Subjective:  Pt is admitted to [facility] on 12/22/2021 with intractable vomiting on 12/22/2021 with intractable vomiting which has been going for weeks.  He noted to have mild leukocytosis and AKI on admission patient has a past medical history significant for dementia, hypertension, HFpEF, CKD 3a and CVA.  We were asked to see the patient for AKI with a significantly elevated BUN and creatinine  Patient seen sitting up in bed, alert and oriented Sister at bedside Currently attempting to eat breakfast States he feels better today Denies shortness of breath, nausea, vomiting, and diarrhea  Creatinine 7.98 Urine output recorded of 800 mL with 1 additional occurrence in 24 hours  Objective:  Vital signs in last 24 hours:  Temp:  [98.1 F (36.7 C)-99.1 F (37.3 C)] 98.1 F (36.7 C) (03/01 0803) Pulse Rate:  [68-79] 73 (03/01 0803) Resp:  [16-18] 16 (03/01 0803) BP: (111-144)/(56-71) 144/71 (03/01 0803) SpO2:  [98 %-100 %] 98 % (03/01 0803)  Weight change:  Filed Weights   12/30/21 0804 12/30/21 1455 12/30/21 2000  Weight: 79.5 kg 78.8 kg 77.2 kg    Intake/Output: I/O last 3 completed shifts: In: 440 [P.O.:440] Out: 1750 [Urine:1750]   Intake/Output this shift:  Total I/O In: -  Out: 275 [Urine:275]  Physical Exam: General: NAD  Head: Normocephalic, atraumatic. Moist oral mucosal membranes  Eyes: Anicteric  Lungs:  Clear to auscultation, normal effort  Heart: S1S2 no rubs  Abdomen:  Soft, nontender, bowel sounds present  Extremities:  No peripheral edema.  Neurologic: Alert following simple commands  Skin: No acute rash  Access: Rt IJ permcath placed by Dr Lucky Cowboy on 53/79/43    Basic Metabolic Panel: Recent Labs  Lab 12/29/21 0445 12/30/21 0439 12/31/21 0505 01/01/22 0404 01/03/22 0436 01/04/22 0451  NA 138 137 134* 136 137 137  K 3.2* 3.2* 3.0* 3.5 3.2* 3.3*  CL 102 103 98 97* 101 99  CO2 '25 25 27 29 29 29  ' GLUCOSE 107*  94 95 102* 126* 101*  BUN 33* 33* 20 24* 30* 33*  CREATININE 6.94* 7.33* 5.08* 6.33* 7.54* 7.98*  CALCIUM 7.9* 7.8* 7.8* 8.1* 8.0* 8.1*  MG  --   --  1.7  --   --   --   PHOS 3.0 3.7 2.3* 3.2 3.5  --      Liver Function Tests: Recent Labs  Lab 12/29/21 0445 12/30/21 0439 12/31/21 0505 01/01/22 0404 01/03/22 0436  ALBUMIN 2.7* 2.7* 2.6* 2.6* 2.6*    No results for input(s): LIPASE, AMYLASE in the last 168 hours.  No results for input(s): AMMONIA in the last 168 hours.  CBC: Recent Labs  Lab 01/04/22 0451  WBC 13.5*  HGB 8.5*  HCT 25.3*  MCV 96.6  PLT 191     Cardiac Enzymes: No results for input(s): CKTOTAL, CKMB, CKMBINDEX, TROPONINI in the last 168 hours.  BNP: Invalid input(s): POCBNP  CBG: Recent Labs  Lab 12/29/21 0421 01/01/22 2247 01/02/22 0837  GLUCAP 106* 117* 90     Microbiology: Results for orders placed or performed during the hospital encounter of 12/22/21  Resp Panel by RT-PCR (Flu A&B, Covid) Nasopharyngeal Swab     Status: None   Collection Time: 12/22/21 11:18 PM   Specimen: Nasopharyngeal Swab; Nasopharyngeal(NP) swabs in vial transport medium  Result Value Ref Range Status   SARS Coronavirus 2 by RT PCR NEGATIVE NEGATIVE Final    Comment: (NOTE) SARS-CoV-2 target nucleic acids are NOT  DETECTED.  The SARS-CoV-2 RNA is generally detectable in upper respiratory specimens during the acute phase of infection. The lowest concentration of SARS-CoV-2 viral copies this assay can detect is 138 copies/mL. A negative result does not preclude SARS-Cov-2 infection and should not be used as the sole basis for treatment or other patient management decisions. A negative result may occur with  improper specimen collection/handling, submission of specimen other than nasopharyngeal swab, presence of viral mutation(s) within the areas targeted by this assay, and inadequate number of viral copies(<138 copies/mL). A negative result must be combined  with clinical observations, patient history, and epidemiological information. The expected result is Negative.  Fact Sheet for Patients:  EntrepreneurPulse.com.au  Fact Sheet for Healthcare Providers:  IncredibleEmployment.be  This test is no t yet approved or cleared by the [place] FDA and  has been authorized for detection and/or diagnosis of SARS-CoV-2 by FDA under an Emergency Use Authorization (EUA). This EUA will remain  in effect (meaning this test can be used) for the duration of the COVID-19 declaration under Section 564(b)(1) of the Act, 21 U.S.C.section 360bbb-3(b)(1), unless the authorization is terminated  or revoked sooner.       Influenza A by PCR NEGATIVE NEGATIVE Final   Influenza B by PCR NEGATIVE NEGATIVE Final    Comment: (NOTE) The Xpert Xpress SARS-CoV-2/FLU/RSV plus assay is intended as an aid in the diagnosis of influenza from Nasopharyngeal swab specimens and should not be used as a sole basis for treatment. Nasal washings and aspirates are unacceptable for Xpert Xpress SARS-CoV-2/FLU/RSV testing.  Fact Sheet for Patients: EntrepreneurPulse.com.au  Fact Sheet for Healthcare Providers: IncredibleEmployment.be  This test is not yet approved or cleared by the [place] FDA and has been authorized for detection and/or diagnosis of SARS-CoV-2 by FDA under an Emergency Use Authorization (EUA). This EUA will remain in effect (meaning this test can be used) for the duration of the COVID-19 declaration under Section 564(b)(1) of the Act, 21 U.S.C. section 360bbb-3(b)(1), unless the authorization is terminated or revoked.  Performed at [facility], [address] [place], [place] [address]     Coagulation Studies: No results for input(s): LABPROT, INR in the last 72 hours.  Urinalysis: No results for input(s): COLORURINE, LABSPEC, PHURINE, GLUCOSEU, HGBUR,  BILIRUBINUR, KETONESUR, PROTEINUR, UROBILINOGEN, NITRITE, LEUKOCYTESUR in the last 72 hours.  Invalid input(s): APPERANCEUR     Imaging: No results found.   Medications:      amLODipine  10 mg Oral Daily   ARIPiprazole  1 mg Oral Daily   aspirin EC  81 mg Oral Daily   carvedilol  25 mg Oral BID WC   Chlorhexidine Gluconate Cloth  6 each Topical Q0600   diclofenac Sodium  2 g Topical QID   feeding supplement (NEPRO CARB STEADY)  237 mL Oral TID BM   heparin injection (subcutaneous)  5,000 Units Subcutaneous Q8H   hydrALAZINE  25 mg Oral Q8H   multivitamin  1 tablet Oral QHS   pantoprazole  40 mg Oral BID   polyethylene glycol  17 g Oral Daily   rosuvastatin  10 mg Oral Daily   sertraline  100 mg Oral Daily   sodium bicarbonate  650 mg Oral TID   tamsulosin  0.4 mg Oral Daily   acetaminophen **OR** acetaminophen, hydrALAZINE, HYDROmorphone (DILAUDID) injection, ondansetron **OR** ondansetron (ZOFRAN) IV, ondansetron (ZOFRAN) IV, sorbitol, milk of mag, mineral oil, glycerin (SMOG) enema  Assessment/ Plan:  61 y.o. male with a past medical history significant  for dementia, hypertension, heart failure, CKD 3a and CVA admitted to [facility] on 12/22/2021 with intractable vomiting.  Also noted to have mild leukocytosis and AKI.   #1 AKI on CKD 3a with baseline creatinine of 1.4 and EGFR of 52.  Patient's latest BUN/creatinine are 50 and 6.94 respectively.  It is felt that acute kidney injury could have been contributed from usage of Advil/NSAIDs and from dehydration secondary to intractable vomiting.   Continuing to monitor renal function for recovery.  Creatinine remains elevated however BUN is stable.  Patient making urine.  No acute indication for dialysis today.  We will continue to monitor need daily.  We will continue to search for outpatient dialysis clinic, patient will likely need dialysis for a couple weeks beyond discharge to achieve adequate recovery.  #2 recurrent vomiting  likely multifactorial including advancing dementia ,medication side effect or functional dyspepsia.  GI is consulted.  EGD completed with no signs of active bleeding.  Did see gastritis, small hiatal hernia and LA grade B reflux esophagitis.  Concerning areas biopsied.  Tolerating small meals.  Continue to encourage oral intake.    3. Anemia of chronic kidney disease  Normocytic Lab Results  Component Value Date   HGB 8.5 (L) 01/04/2022  Hgb within desired target.  4.   Hypertension, well controlled.  Currently receiving amlodipine, carvedilol, hydralazine, and tamsulosin.  BP currently 144/71   LOS: [place] 3/1/20232:17 PM

## 2022-01-04 NOTE — Progress Notes (Signed)
Physical Therapy Treatment ?Patient Details ?Name: Randall [name] ?MRN: [id number] ?DOB: 1961/06/02 ?Today's Date: 01/04/2022 ? ? ?History of Present Illness Randall [name] is a 61 y.o. male seen for evaluation of recurrent vomiting at the request of Dr. Judd Gaudier. Patient has a PMH of dementia, hypertension, diabetes, chronic kidney disease, CHF.  Patient presented to the [facility] ED for chief complaint of nausea, vomiting and weakness. Upon presentation to the ED yesterday, vital signs were stable with blood pressure 120/72, heart rate 67, respirations 18, temperature 98.7, pulse oximetry 97%. Labs were significant for new AKI with Cr 3.5, normal electrolytes GFR 19, glucose 176, WBC 17.9, UA unremarkable.  Imaging studies revealed CT of the abdomen pelvis revealed a possible tiny hiatal hernia, constipation, otherwise no acute intra-abdominal or intrapelvic abnormality.  Aortic atherosclerosis.  He is being admitted for IV hydration.  GI is consulted for evaluation and management of recurrent vomiting. ? ?  ?PT Comments  ? ? Pt received upright in bed agreeable to PT services. Pt mod-I with all bed mobility tasks and supervision for transfers with RW with min VC's for hand placement. Pt progressing gait to 180' with RW with consistent step through pattern with x1 instance of minor balance deficit with turning leading to L wheel on RW to leave ground but pt able to self recover. Appears pt is more steady today with no buckling or shakiness of LE's and no issues with obstacle or hallway navigation. Pt returned to seated EOB tolerating minimal LE therex prior to fatigue requesting session to end. Returned to supine with all needs in reach. Pt progressing in safety and independence with OOB mobility. D/c recs remain appropriate.  ?  ?Recommendations for follow up therapy are one component of a multi-disciplinary discharge planning process, led by the attending physician.  Recommendations may be updated based on  patient status, additional functional criteria and insurance authorization. ? ?Follow Up Recommendations ? Home health PT ?  ?  ?Assistance Recommended at Discharge Intermittent Supervision/Assistance  ?Patient can return home with the following A little help with walking and/or transfers;A little help with bathing/dressing/bathroom;Help with stairs or ramp for entrance;Assist for transportation;Direct supervision/assist for financial management;Direct supervision/assist for medications management ?  ?Equipment Recommendations ? Rolling walker (2 wheels)  ?  ?Recommendations for Other Services   ? ? ?  ?Precautions / Restrictions Precautions ?Precautions: Fall ?Restrictions ?Weight Bearing Restrictions: No  ?  ? ?Mobility ? Bed Mobility ?Overal bed mobility: Modified Independent ?Bed Mobility: Supine to Sit, Sit to Supine ?  ?  ?Supine to sit: Modified independent (Device/Increase time) ?Sit to supine: Modified independent (Device/Increase time) ?  ?  ?Patient Response: Cooperative, Flat affect ? ?Transfers ?Overall transfer level: Needs assistance ?Equipment used: Rolling walker (2 wheels) ?Transfers: Sit to/from Stand ?Sit to Stand: Supervision ?  ?  ?  ?  ?  ?General transfer comment: VC's for hand placement ?  ? ?Ambulation/Gait ?Ambulation/Gait assistance: Min guard ?Gait Distance (Feet): 180 Feet ?Assistive device: Rolling walker (2 wheels) ?  ?  ?  ?  ?General Gait Details: x1 instance with turning elevating L wheel of RW off floor. Remained stable with turns otherwise with step through pattern. Slowed gait speed but reports he is at baseline with speed of gait. ? ? ?Stairs ?  ?  ?  ?  ?  ? ? ?Wheelchair Mobility ?  ? ?Modified Rankin (Stroke Patients Only) ?  ? ? ?  ?Balance Overall balance assessment: Needs assistance ?Sitting-balance support:  Feet supported ?Sitting balance-Leahy Scale: Good ?  ?  ?Standing balance support: Bilateral upper extremity supported, During functional activity, Reliant on  assistive device for balance ?Standing balance-Leahy Scale: Fair ?Standing balance comment: UE's on RW throughout ?  ?  ?  ?  ?  ?  ?  ?  ?  ?  ?  ?  ? ?  ?Cognition Arousal/Alertness: Lethargic ?Behavior During Therapy: [facility] for tasks assessed/performed, Flat affect ?Overall Cognitive Status: Within Functional Limits for tasks assessed ?  ?  ?  ?  ?  ?  ?  ?  ?  ?  ?  ?  ?  ?  ?  ?  ?General Comments: patient is able to follow all commands with increased time, cooperative throughout session ?  ?  ? ?  ?Exercises General Exercises - Lower Extremity ?Long Arc Quad: AROM, Strengthening, Both, 10 reps, Seated ?Hip Flexion/Marching: AROM, Strengthening, Both, 10 reps, Seated ? ?  ?General Comments   ?  ?  ? ?Pertinent Vitals/Pain Pain Assessment ?Pain Assessment: No/denies pain  ? ? ?Home Living   ?  ?  ?  ?  ?  ?  ?  ?  ?  ?   ?  ?Prior Function    ?  ?  ?   ? ?PT Goals (current goals can now be found in the care plan section) Acute Rehab PT Goals ?Patient Stated Goal: to return home ?PT Goal Formulation: With patient ?Time For Goal Achievement: 01/06/22 ?Potential to Achieve Goals: Fair ?Progress towards PT goals: Progressing toward goals ? ?  ?Frequency ? ? ? Min 2X/week ? ? ? ?  ?PT Plan Current plan remains appropriate  ? ? ?Co-evaluation   ?  ?  ?  ?  ? ?  ?AM-PAC PT "6 Clicks" Mobility   ?Outcome Measure ? Help needed turning from your back to your side while in a flat bed without using bedrails?: None ?Help needed moving from lying on your back to sitting on the side of a flat bed without using bedrails?: None ?Help needed moving to and from a bed to a chair (including a wheelchair)?: A Little ?Help needed standing up from a chair using your arms (e.g., wheelchair or bedside chair)?: A Little ?Help needed to walk in hospital room?: A Little ?Help needed climbing 3-5 steps with a railing? : A Little ?6 Click Score: 20 ? ?  ?End of Session Equipment Utilized During Treatment: Gait belt ?Activity Tolerance: Patient  tolerated treatment well ?Patient left: in bed;with call bell/phone within reach;with bed alarm set ?Nurse Communication: Mobility status ?PT Visit Diagnosis: Unsteadiness on feet (R26.81);Muscle weakness (generalized) (M62.81);Difficulty in walking, not elsewhere classified (R26.2) ?  ? ? ?Time: 1761-6073 ?PT Time Calculation (min) (ACUTE ONLY): 12 min ? ?Charges:  $Therapeutic Exercise: 8-22 mins          ?          ?Salem Caster. Fairly IV, PT, DPT ?Physical Therapist- [place]  ?[facility]  ?01/04/2022, 1:48 PM ? ?

## 2022-01-05 DIAGNOSIS — R1110 Vomiting, unspecified: Secondary | ICD-10-CM | POA: Diagnosis not present

## 2022-01-05 LAB — BASIC METABOLIC PANEL
Anion gap: 11 (ref 5–15)
BUN: 35 mg/dL — ABNORMAL HIGH (ref 6–20)
CO2: 28 mmol/L (ref 22–32)
Calcium: 8.4 mg/dL — ABNORMAL LOW (ref 8.9–10.3)
Chloride: 100 mmol/L (ref 98–111)
Creatinine, Ser: 8 mg/dL — ABNORMAL HIGH (ref 0.61–1.24)
GFR, Estimated: 7 mL/min — ABNORMAL LOW (ref >60)
Glucose, Bld: 101 mg/dL — ABNORMAL HIGH (ref 70–99)
Potassium: 3.7 mmol/L (ref 3.5–5.1)
Sodium: 139 mmol/L (ref 135–145)

## 2022-01-05 LAB — CBC
HCT: 26.6 % — ABNORMAL LOW (ref 39.0–52.0)
Hemoglobin: 8.8 g/dL — ABNORMAL LOW (ref 13.0–17.0)
MCH: 31.9 pg (ref 26.0–34.0)
MCHC: 33.1 g/dL (ref 30.0–36.0)
MCV: 96.4 fL (ref 80.0–100.0)
Platelets: 210 10*3/uL (ref 150–400)
RBC: 2.76 MIL/uL — ABNORMAL LOW (ref 4.22–5.81)
RDW: 12.7 % (ref 11.5–15.5)
WBC: 12.2 10*3/uL — ABNORMAL HIGH (ref 4.0–10.5)
nRBC: 0 % (ref 0.0–0.2)

## 2022-01-05 LAB — MAGNESIUM: Magnesium: 2 mg/dL (ref 1.7–2.4)

## 2022-01-05 MED ORDER — SENNA 8.6 MG PO TABS
1.0000 | ORAL_TABLET | Freq: Every day | ORAL | Status: DC
  Administered 2022-01-05 – 2022-01-16 (×12): 8.6 mg via ORAL
  Filled 2022-01-05 (×12): qty 1

## 2022-01-05 MED ORDER — POLYETHYLENE GLYCOL 3350 17 G PO PACK
34.0000 g | PACK | Freq: Every day | ORAL | Status: DC
  Administered 2022-01-06 – 2022-01-17 (×11): 34 g via ORAL
  Filled 2022-01-05 (×11): qty 2

## 2022-01-05 NOTE — Evaluation (Addendum)
Occupational Therapy Re-Evaluation ?Patient Details ?Name: Randall [name] ?MRN: [id number] ?DOB: [birth year] ?Today's Date: 01/05/2022 ? ? ?History of Present Illness LORANZO DESHA is a 61 y.o. male seen for evaluation of recurrent vomiting at the request of Dr. Judd Gaudier. Patient has a PMH of dementia, hypertension, diabetes, chronic kidney disease, CHF.  Patient presented to the [facility] ED for chief complaint of nausea, vomiting and weakness. Upon presentation to the ED yesterday, vital signs were stable with blood pressure 120/72, heart rate 67, respirations 18, temperature 98.7, pulse oximetry 97%. Labs were significant for new AKI with Cr 3.5, normal electrolytes GFR 19, glucose 176, WBC 17.9, UA unremarkable.  Imaging studies revealed CT of the abdomen pelvis revealed a possible tiny hiatal hernia, constipation, otherwise no acute intra-abdominal or intrapelvic abnormality.  Aortic atherosclerosis.  He is being admitted for IV hydration.  GI is consulted for evaluation and management of recurrent vomiting.  ? ?Clinical Impression ?  ?Upon entering the room, pt supine in bed and talking to therapist with eyes closed. OT asking pt to open eyes to increase alertness. Pt performing bed mobility without physical assistance. Pt standing with close supervision. Pt taking a few steps to sink and standing to brush teeth and don dentures. Pt very slow to process and needing min cuing for sequencing and initiation for novel grooming tasks. Pt requests to ambulate without use of AD and needing min guard progressing to close supervision for ~ 200' with min cuing for forward gaze. Pt returns to bed with all needs within reach.  Pt continues to benefit from OT intervention and is making good progress towards goals. All goals set at supervision level for safety with functional tasks.  ?   ? ?Recommendations for follow up therapy are one component of a multi-disciplinary discharge planning process, led by the attending  physician.  Recommendations may be updated based on patient status, additional functional criteria and insurance authorization.  ? ?Follow Up Recommendations ? Home health OT  ?  ?Assistance Recommended at Discharge Frequent or constant Supervision/Assistance  ?Patient can return home with the following A little help with walking and/or transfers;Assistance with cooking/housework;Assist for transportation;Direct supervision/assist for medications management;Direct supervision/assist for financial management;Help with stairs or ramp for entrance;A little help with bathing/dressing/bathroom ? ?  ?   ?Equipment Recommendations ? BSC/3in1  ?  ?   ?Precautions / Restrictions Precautions ?Precautions: Fall ?Restrictions ?Weight Bearing Restrictions: No  ? ?  ? ?Mobility Bed Mobility ?Overal bed mobility: Modified Independent ?Bed Mobility: Supine to Sit, Sit to Supine ?  ?  ?Supine to sit: Modified independent (Device/Increase time) ?Sit to supine: Modified independent (Device/Increase time) ?  ?General bed mobility comments: no physical assist needed ?  ? ?Transfers ?Overall transfer level: Needs assistance ?Equipment used: 1 person hand held assist ?Transfers: Sit to/from Stand ?Sit to Stand: Supervision ?  ?  ?  ?  ?  ?  ?  ? ?  ?Balance Overall balance assessment: Needs assistance ?Sitting-balance support: Feet supported ?Sitting balance-Leahy Scale: Good ?  ?  ?Standing balance support: Bilateral upper extremity supported, During functional activity ?Standing balance-Leahy Scale: Fair ?Standing balance comment: min guard assist for dynamic standing tasks without use of AD ?  ?  ?  ?  ?  ?  ?  ?  ?  ?  ?  ?   ? ?ADL either performed or assessed with clinical judgement  ? ?ADL Overall ADL's : Needs assistance/impaired ?  ?  ?Grooming: Wash/dry  hands;Wash/dry face;Oral care;Standing;Supervision/safety ?  ?  ?  ?  ?  ?  ?  ?  ?  ?  ?  ?  ?  ?  ?  ?  ?   ? ? ? ?Vision Patient Visual Report: No change from baseline ?   ?    ?   ?   ? ?Pertinent Vitals/Pain Pain Assessment ?Pain Assessment: No/denies pain  ? ? ? ?   ?   ?   ?Cognition Arousal/Alertness: Awake/alert ?Behavior During Therapy: [facility] for tasks assessed/performed, Flat affect ?Overall Cognitive Status: Within Functional Limits for tasks assessed ?Area of Impairment: Following commands, Safety/judgement, Problem solving ?  ?  ?  ?  ?  ?  ?  ?  ?  ?  ?  ?Following Commands: Follows one step commands consistently ?Safety/Judgement: Decreased awareness of safety ?  ?Problem Solving: Slow processing, Decreased initiation, Requires verbal cues ?General Comments: very slow to process and needing cuing for sequencing and initiation ?  ?  ? ? ?OT Goals(Current goals can be found in the care plan section) Acute Rehab OT Goals ?Patient Stated Goal: to go home ?OT Goal Formulation: With patient/family ?Time For Goal Achievement: 01/19/22 ?Potential to Achieve Goals: Good ?ADL Goals ?Pt Will Transfer to Toilet: with supervision ?Pt Will Perform Toileting - Clothing Manipulation and hygiene: with supervision ?Additional ADL Goal #1: Pt will perform all aspects of dressing with supervision.  ?OT Frequency: Min 2X/week ?  ? ?   ?AM-PAC OT "6 Clicks" Daily Activity     ?Outcome Measure Help from another person eating meals?: None ?Help from another person taking care of personal grooming?: None ?Help from another person toileting, which includes using toliet, bedpan, or urinal?: A Little ?Help from another person bathing (including washing, rinsing, drying)?: A Little ?Help from another person to put on and taking off regular upper body clothing?: None ?Help from another person to put on and taking off regular lower body clothing?: A Little ?6 Click Score: 21 ?  ?End of Session Nurse Communication: Mobility status ? ?Activity Tolerance: Patient tolerated treatment well ?Patient left: in bed;with call bell/phone within reach;with bed alarm set;with family/visitor present ? ?OT Visit Diagnosis:  Other abnormalities of gait and mobility (R26.89)  ?              ?Time: 1050-1107 ?OT Time Calculation (min): 17 min ?Charges:  OT General Charges ?$OT Visit: 1 Visit ?OT Evaluation ?$OT Re-eval: 1 Re-eval ?OT Treatments ?$Self Care/Home Management : 8-22 mins ? ?Darleen Crocker, [place], OTR/L , [place] ?ascom [phone]  ?01/05/22, 1:18 PM  ?

## 2022-01-05 NOTE — Progress Notes (Signed)
?  Progress Note ? ? ?Patient: Randall [name] UEK:[id number] DOB: [birth year] DOA: 12/22/2021     12 ?DOS: the patient was seen and examined on 01/05/2022 ?  ?Brief hospital course: ?No notes on file ? ? ?Assessment and Plan: ?Recurrent vomiting- (present on admission) ?resolved. ?Recurrent episodes with second hospitalization in 2 weeks for the same ?No definite etiology identified. ?GI was consulted and patient underwent EGD on 12/26/2020 which shows esophagitis, some esophageal dysmotility, gastritis and few nonbleeding erosions in duodenum, biopsies were taken, which were negative. Vomiting and nausea resolved, tolerating diet ?-Continue with Protonix ? ?Anemia ?Hgb 8s from 11s on admission. No hematochezia or melena or other bleeding. Likely 2/2 new renal dysfunction ?- monitor closely ? ?Hypokalemia ?Resolved w/ supplementation ? ?Essential hypertension- (present on admission) ?Blood pressure improved after adding hydralazine. ?-Hold Entresto as advised by nephrology while waiting for any renal function recovery. ?-Continue amlodipine  ?-Continue hydralazine,  ?-Continue to monitor ? ?Moderate major depression ([place]) ?- Continue Abilify and Zoloft ? ?Acute renal failure superimposed on stage 3a chronic kidney disease ([place]) ?Good urinary output . ?Nephrology was consulted and they decided to proceed with hemodialysis.  Received 3 sessions of initial dialysis with temporary catheter which was then switched with permanent cath. ?-dialysis now on hold. Making good urine but gfr remains below 15. ?-Monitor renal function ?-Avoid nephrotoxins ?- will likely need dialysis ? ?History of CVA (cerebrovascular accident) ?Continue aspirin and statin ? ?Chronic systolic CHF (congestive heart failure) ([place])- (present on admission) ?Currently euvolemic ?-Continue Coreg, Carvedilol  ?-holding entresto as above ? ?Dementia ([place])- (present on admission) ?Continue Namenda and sertraline ? ?Constipation ?- increase miralax, add  senna ? ?Subjective: Patient was seen and examined today.  No new complaint.  Tolerating diet, no pain. No bm for a couple of days ? ?Physical Exam: ?Vitals:  ? 01/04/22 1504 01/04/22 2038 01/05/22 0423 01/05/22 0754  ?BP: 116/72 121/75 (!) 144/72 133/78  ?Pulse: 69 71 75 75  ?Resp: 16 16 16 20   ?Temp: 98.2 ?F (36.8 ?C) 98.8 ?F (37.1 ?C) 98.7 ?F (37.1 ?C) 98.3 ?F (36.8 ?C)  ?TempSrc: Oral  Oral   ?SpO2: 100% 100% 98% 98%  ?Weight:      ?Height:      ? ?General.  In no acute distress. ?Pulmonary.  Lungs clear bilaterally, normal respiratory effort. ?CV.  Regular rate and rhythm, no JVD, rub or murmur. ?Abdomen.  Soft, nontender, nondistended, BS positive. ?CNS.  Alert and oriented .  No focal neurologic deficit. ?Extremities.  No edema, no cyanosis, pulses intact and symmetrical. ?Psychiatry.  Judgment and insight appears normal. ? ?Data Reviewed: ?Prior notes and labs reviewed. ? ?Family Communication: family updated @ bedside 3/1 ? ?Disposition: ?Status is: Inpatient ?Remains inpatient appropriate because: Severity of illness. May need outpt dialysis ? ? Planned Discharge Destination: Home with Home Health (PT/OT following) ? ?DVT prophylaxis.  Subcu heparin ? ?Time spent: 25 minutes ? ? ?Author: ?Desma Maxim, MD ?01/05/2022 12:41 PM ? ?For on call review [email or web address].  ? ?

## 2022-01-05 NOTE — Progress Notes (Signed)
[facility]  ROUNDING NOTE   Subjective:  Pt is admitted to [facility] on 12/22/2021 with intractable vomiting on 12/22/2021 with intractable vomiting which has been going for weeks.  He noted to have mild leukocytosis and AKI on admission patient has a past medical history significant for dementia, hypertension, HFpEF, CKD 3a and CVA.  We were asked to see the patient for AKI with a significantly elevated BUN and creatinine  Patient seen sitting up in bed Breakfast tray at bedside States he feels better today Denies nausea and vomiting Denies shortness of breath  Creatinine 8 UOP 860m in 24 hours  Objective:  Vital signs in last 24 hours:  Temp:  [98.2 F (36.8 C)-98.8 F (37.1 C)] 98.3 F (36.8 C) (03/02 0754) Pulse Rate:  [69-75] 75 (03/02 0754) Resp:  [16-20] 20 (03/02 0754) BP: (116-144)/(72-78) 133/78 (03/02 0754) SpO2:  [98 %-100 %] 98 % (03/02 0754)  Weight change:  Filed Weights   12/30/21 0804 12/30/21 1455 12/30/21 2000  Weight: 79.5 kg 78.8 kg 77.2 kg    Intake/Output: I/O last 3 completed shifts: In: -  Out: 1375 [Urine:1375]   Intake/Output this shift:  No intake/output data recorded.  Physical Exam: General: NAD  Head: Normocephalic, atraumatic. Moist oral mucosal membranes  Eyes: Anicteric  Lungs:  Clear to auscultation, normal effort  Heart: S1S2 no rubs  Abdomen:  Soft, nontender, bowel sounds present  Extremities:  No peripheral edema.  Neurologic: Alert following simple commands  Skin: No acute rash  Access: Rt IJ permcath placed by Dr DLucky Cowboyon 061/01/60   Basic Metabolic Panel: Recent Labs  Lab 12/30/21 0439 12/31/21 0505 01/01/22 0404 01/03/22 0436 01/04/22 0451 01/05/22 0420  NA 137 134* 136 137 137 139  K 3.2* 3.0* 3.5 3.2* 3.3* 3.7  CL 103 98 97* 101 99 100  CO2 '25 27 29 29 29 28  ' GLUCOSE 94 95 102* 126* 101* 101*  BUN 33* 20 24* 30* 33* 35*  CREATININE 7.33* 5.08* 6.33* 7.54* 7.98* 8.00*  CALCIUM 7.8* 7.8* 8.1* 8.0*  8.1* 8.4*  MG  --  1.7  --   --  1.9 2.0  PHOS 3.7 2.3* 3.2 3.5  --   --      Liver Function Tests: Recent Labs  Lab 12/30/21 0439 12/31/21 0505 01/01/22 0404 01/03/22 0436  ALBUMIN 2.7* 2.6* 2.6* 2.6*    No results for input(s): LIPASE, AMYLASE in the last 168 hours.  No results for input(s): AMMONIA in the last 168 hours.  CBC: Recent Labs  Lab 01/04/22 0451 01/05/22 0420  WBC 13.5* 12.2*  HGB 8.5* 8.8*  HCT 25.3* 26.6*  MCV 96.6 96.4  PLT 191 210     Cardiac Enzymes: No results for input(s): CKTOTAL, CKMB, CKMBINDEX, TROPONINI in the last 168 hours.  BNP: Invalid input(s): POCBNP  CBG: Recent Labs  Lab 01/01/22 2247 01/02/22 0837  GLUCAP 117* 90     Microbiology: Results for orders placed or performed during the hospital encounter of 12/22/21  Resp Panel by RT-PCR (Flu A&B, Covid) Nasopharyngeal Swab     Status: None   Collection Time: 12/22/21 11:18 PM   Specimen: Nasopharyngeal Swab; Nasopharyngeal(NP) swabs in vial transport medium  Result Value Ref Range Status   SARS Coronavirus 2 by RT PCR NEGATIVE NEGATIVE Final    Comment: (NOTE) SARS-CoV-2 target nucleic acids are NOT DETECTED.  The SARS-CoV-2 RNA is generally detectable in upper respiratory specimens during the acute phase of infection. The lowest  concentration of SARS-CoV-2 viral copies this assay can detect is 138 copies/mL. A negative result does not preclude SARS-Cov-2 infection and should not be used as the sole basis for treatment or other patient management decisions. A negative result may occur with  improper specimen collection/handling, submission of specimen other than nasopharyngeal swab, presence of viral mutation(s) within the areas targeted by this assay, and inadequate number of viral copies(<138 copies/mL). A negative result must be combined with clinical observations, patient history, and epidemiological information. The expected result is Negative.  Fact Sheet for  Patients:  EntrepreneurPulse.com.au  Fact Sheet for Healthcare Providers:  IncredibleEmployment.be  This test is no t yet approved or cleared by the [place] FDA and  has been authorized for detection and/or diagnosis of SARS-CoV-2 by FDA under an Emergency Use Authorization (EUA). This EUA will remain  in effect (meaning this test can be used) for the duration of the COVID-19 declaration under Section 564(b)(1) of the Act, 21 U.S.C.section 360bbb-3(b)(1), unless the authorization is terminated  or revoked sooner.       Influenza A by PCR NEGATIVE NEGATIVE Final   Influenza B by PCR NEGATIVE NEGATIVE Final    Comment: (NOTE) The Xpert Xpress SARS-CoV-2/FLU/RSV plus assay is intended as an aid in the diagnosis of influenza from Nasopharyngeal swab specimens and should not be used as a sole basis for treatment. Nasal washings and aspirates are unacceptable for Xpert Xpress SARS-CoV-2/FLU/RSV testing.  Fact Sheet for Patients: EntrepreneurPulse.com.au  Fact Sheet for Healthcare Providers: IncredibleEmployment.be  This test is not yet approved or cleared by the [place] FDA and has been authorized for detection and/or diagnosis of SARS-CoV-2 by FDA under an Emergency Use Authorization (EUA). This EUA will remain in effect (meaning this test can be used) for the duration of the COVID-19 declaration under Section 564(b)(1) of the Act, 21 U.S.C. section 360bbb-3(b)(1), unless the authorization is terminated or revoked.  Performed at [facility], [address] [place], [place] [address]     Coagulation Studies: No results for input(s): LABPROT, INR in the last 72 hours.  Urinalysis: No results for input(s): COLORURINE, LABSPEC, PHURINE, GLUCOSEU, HGBUR, BILIRUBINUR, KETONESUR, PROTEINUR, UROBILINOGEN, NITRITE, LEUKOCYTESUR in the last 72 hours.  Invalid input(s): APPERANCEUR      Imaging: No results found.   Medications:      amLODipine  10 mg Oral Daily   ARIPiprazole  1 mg Oral Daily   aspirin EC  81 mg Oral Daily   carvedilol  25 mg Oral BID WC   Chlorhexidine Gluconate Cloth  6 each Topical Q0600   diclofenac Sodium  2 g Topical QID   feeding supplement (NEPRO CARB STEADY)  237 mL Oral TID BM   heparin injection (subcutaneous)  5,000 Units Subcutaneous Q8H   hydrALAZINE  25 mg Oral Q8H   multivitamin  1 tablet Oral QHS   pantoprazole  40 mg Oral BID   [START ON 01/06/2022] polyethylene glycol  34 g Oral Daily   rosuvastatin  10 mg Oral Daily   senna  1 tablet Oral QHS   sertraline  100 mg Oral Daily   sodium bicarbonate  650 mg Oral TID   tamsulosin  0.4 mg Oral Daily   acetaminophen **OR** acetaminophen, hydrALAZINE, ondansetron **OR** ondansetron (ZOFRAN) IV, ondansetron (ZOFRAN) IV, sorbitol, milk of mag, mineral oil, glycerin (SMOG) enema  Assessment/ Plan:  61 y.o. male with a past medical history significant for dementia, hypertension, heart failure, CKD 3a and CVA admitted to [facility]  on 12/22/2021 with intractable vomiting.  Also noted to have mild leukocytosis and AKI.   #1 AKI on CKD 3a with baseline creatinine of 1.4 and EGFR of 52.  Patient's latest BUN/creatinine are 50 and 6.94 respectively.  It is felt that acute kidney injury could have been contributed from usage of Advil/NSAIDs and from dehydration secondary to intractable vomiting.   Creatinine relatively stable today.  Adequate urine output recorded.  Creatinine has plateaued and will begin to decrease tomorrow.  No acute indication for dialysis today but will continue to monitor daily. Dialysis coordinator aware patient and will continue outpatient clinic search as precaution if need continues.   #2 recurrent vomiting likely multifactorial including advancing dementia ,medication side effect or functional dyspepsia.  GI is consulted.  EGD completed with no signs of active bleeding.   Did see gastritis, small hiatal hernia and LA grade B reflux esophagitis.  Concerning areas biopsied.  Tolerating small meals.  Appetite slowly improving    3. Anemia of chronic kidney disease  Normocytic Lab Results  Component Value Date   HGB 8.8 (L) 01/05/2022  Hgb just below target, will monitor  4.   Hypertension, well controlled.  Currently receiving amlodipine, carvedilol, hydralazine, and tamsulosin.  BP currently 133/78   LOS: 12   3/2/20231:24 PM

## 2022-01-05 NOTE — Plan of Care (Signed)
?  Problem: Clinical Measurements: ?Goal: Ability to maintain clinical measurements within normal limits will improve ?Outcome: Progressing ?Goal: Will remain free from infection ?Outcome: Progressing ?Goal: Diagnostic test results will improve ?Outcome: Progressing ?Goal: Respiratory complications will improve ?Outcome: Progressing ?  ?Pt is involved in and agrees with the plan of care. V/S stable.  ?

## 2022-01-06 DIAGNOSIS — R1110 Vomiting, unspecified: Secondary | ICD-10-CM | POA: Diagnosis not present

## 2022-01-06 LAB — MAGNESIUM: Magnesium: 2 mg/dL (ref 1.7–2.4)

## 2022-01-06 LAB — BASIC METABOLIC PANEL
Anion gap: 9 (ref 5–15)
BUN: 36 mg/dL — ABNORMAL HIGH (ref 6–20)
CO2: 30 mmol/L (ref 22–32)
Calcium: 8.5 mg/dL — ABNORMAL LOW (ref 8.9–10.3)
Chloride: 101 mmol/L (ref 98–111)
Creatinine, Ser: 7.9 mg/dL — ABNORMAL HIGH (ref 0.61–1.24)
GFR, Estimated: 7 mL/min — ABNORMAL LOW (ref >60)
Glucose, Bld: 101 mg/dL — ABNORMAL HIGH (ref 70–99)
Potassium: 3.8 mmol/L (ref 3.5–5.1)
Sodium: 140 mmol/L (ref 135–145)

## 2022-01-06 MED ORDER — LACTULOSE 10 GM/15ML PO SOLN
20.0000 g | Freq: Every day | ORAL | Status: DC
  Administered 2022-01-06 – 2022-01-08 (×3): 20 g via ORAL
  Filled 2022-01-06 (×3): qty 30

## 2022-01-06 NOTE — Progress Notes (Addendum)
Physical Therapy Treatment ?Patient Details ?Name: Randall [name] ?MRN: [id number] ?DOB: 04/29/1961 ?Today's Date: 01/06/2022 ? ? ?History of Present Illness DEMICO [name] is a 61 y.o. male seen for evaluation of recurrent vomiting at the request of Dr. Judd Gaudier. Patient has a PMH of dementia, hypertension, diabetes, chronic kidney disease, CHF.  Patient presented to the [facility] ED for chief complaint of nausea, vomiting and weakness. Upon presentation to the ED yesterday, vital signs were stable with blood pressure 120/72, heart rate 67, respirations 18, temperature 98.7, pulse oximetry 97%. Labs were significant for new AKI with Cr 3.5, normal electrolytes GFR 19, glucose 176, WBC 17.9, UA unremarkable.  Imaging studies revealed CT of the abdomen pelvis revealed a possible tiny hiatal hernia, constipation, otherwise no acute intra-abdominal or intrapelvic abnormality.  Aortic atherosclerosis.  He is being admitted for IV hydration.  GI is consulted for evaluation and management of recurrent vomiting. ? ?  ?PT Comments  ? ? Pt seen for PT tx with pt agreeable. Pt is able to ambulate 2 laps without AD with CGA & negotiate 7 steps with 1 rail with min assist. Pt demonstrates L inattention but this may be pre-existing as pt reports hx of stroke. Pt engages in sit>stand strengthening exercises & notes fatigue at end of session. Pt is making good progress with functional mobility & PT goals have been updated based on pt's progress. Will continue to follow pt acutely to address balance, endurance, stair negotiation, & strength. ?  ?Recommendations for follow up therapy are one component of a multi-disciplinary discharge planning process, led by the attending physician.  Recommendations may be updated based on patient status, additional functional criteria and insurance authorization. ? ?Follow Up Recommendations ? Home health PT ?  ?  ?Assistance Recommended at Discharge Intermittent Supervision/Assistance   ?Patient can return home with the following A little help with walking and/or transfers;A little help with bathing/dressing/bathroom;Help with stairs or ramp for entrance;Assist for transportation;Direct supervision/assist for financial management;Direct supervision/assist for medications management;Assistance with cooking/housework ?  ?Equipment Recommendations ? Rolling walker (2 wheels)  ?  ?Recommendations for Other Services   ? ? ?  ?Precautions / Restrictions Precautions ?Precautions: Fall ?Restrictions ?Weight Bearing Restrictions: No  ?  ? ?Mobility ? Bed Mobility ?Overal bed mobility: Modified Independent ?Bed Mobility: Supine to Sit ?  ?  ?Supine to sit: Modified independent (Device/Increase time) ?  ?  ?  ?  ? ?Transfers ?Overall transfer level: Needs assistance ?Equipment used: None ?Transfers: Sit to/from Stand ?Sit to Stand: Min guard, Min assist ?  ?  ?  ?  ?  ?  ?  ? ?Ambulation/Gait ?Ambulation/Gait assistance: Min guard ?Gait Distance (Feet): 300 Feet ?Assistive device: None ?Gait Pattern/deviations: Decreased step length - right, Decreased step length - left, Decreased stride length ?Gait velocity: decreased ?  ?  ?General Gait Details: L inattention ? ? ?Stairs ?Stairs: Yes ?Stairs assistance: Min assist ?Stair Management:  (R descending rail, L ascending rail) ?Number of Stairs: 7 ?General stair comments: step-to to descend steps leading with LLE, step to then step over step to ascend ? ? ?Wheelchair Mobility ?  ? ?Modified Rankin (Stroke Patients Only) ?  ? ? ?  ?Balance Overall balance assessment: Needs assistance ?Sitting-balance support: Feet supported ?Sitting balance-Leahy Scale: Good ?  ?  ?Standing balance support: No upper extremity supported, During functional activity ?Standing balance-Leahy Scale: Fair ?  ?  ?  ?  ?  ?  ?  ?  ?  ?  ?  ?  ?  ? ?  ?  Cognition Arousal/Alertness: Awake/alert ?Behavior During Therapy: [facility] for tasks assessed/performed, Flat affect ?Overall Cognitive  Status: History of cognitive impairments - at baseline ?  ?  ?  ?  ?  ?  ?  ?  ?  ?  ?  ?  ?Following Commands: Follows one step commands consistently ?Safety/Judgement: Decreased awareness of safety ?  ?  ?  ?  ?  ? ?  ?Exercises General Exercises - Lower Extremity ?Hip ABduction/ADduction: AROM, Strengthening, Both, 20 reps, Seated (hip adduction pillow squeezes) ?Other Exercises ?Other Exercises: 10x sit<>stand from EOB with CGA increasing to min assist with cuing to scoot out to edge of seat & for normal vs wide BOS ? ?  ?General Comments   ?  ?  ? ?Pertinent Vitals/Pain Pain Assessment ?Pain Assessment: No/denies pain  ? ? ?Home Living   ?  ?  ?  ?  ?  ?  ?  ?  ?  ?   ?  ?Prior Function    ?  ?  ?   ? ?PT Goals (current goals can now be found in the care plan section) Acute Rehab PT Goals ?Patient Stated Goal: to return home ?PT Goal Formulation: With patient ?Time For Goal Achievement: 01/20/22 ?Potential to Achieve Goals: Fair ?Progress towards PT goals: Progressing toward goals ? ?  ?Frequency ? ? ? Min 2X/week ? ? ? ?  ?PT Plan Current plan remains appropriate  ? ? ?Co-evaluation   ?  ?  ?  ?  ? ?  ?AM-PAC PT "6 Clicks" Mobility   ?Outcome Measure ? Help needed turning from your back to your side while in a flat bed without using bedrails?: None ?Help needed moving from lying on your back to sitting on the side of a flat bed without using bedrails?: None ?Help needed moving to and from a bed to a chair (including a wheelchair)?: A Little ?Help needed standing up from a chair using your arms (e.g., wheelchair or bedside chair)?: A Little ?Help needed to walk in hospital room?: A Little ?Help needed climbing 3-5 steps with a railing? : A Little ?6 Click Score: 20 ? ?  ?End of Session Equipment Utilized During Treatment: Gait belt ?Activity Tolerance: Patient tolerated treatment well;Patient limited by fatigue ?Patient left: in chair;with chair alarm set;with call bell/phone within reach (reviewed use of call  bell) ?Nurse Communication: Mobility status ?PT Visit Diagnosis: Unsteadiness on feet (R26.81);Muscle weakness (generalized) (M62.81);Difficulty in walking, not elsewhere classified (R26.2) ?  ? ? ?Time: 7673-4193 ?PT Time Calculation (min) (ACUTE ONLY): 23 min ? ?Charges:  $Therapeutic Activity: 23-37 mins          ?          ? ?Lavone Nian, PT, DPT ?01/06/22, 12:56 PM ? ? ? ?Waunita Schooner ?01/06/2022, 12:56 PM ? ?

## 2022-01-06 NOTE — Progress Notes (Signed)
[facility]  ROUNDING NOTE   Subjective:  Pt is admitted to [facility] on 12/22/2021 with intractable vomiting on 12/22/2021 with intractable vomiting which has been going for weeks.  He noted to have mild leukocytosis and AKI on admission patient has a past medical history significant for dementia, hypertension, HFpEF, CKD 3a and CVA.  We were asked to see the patient for AKI with a significantly elevated BUN and creatinine  Patient currently resting quietly Breakfast tray at bedside untouched Poor appetite remains but patient states he tries to do better with eating Patient seen later with sister at bedside Patient able to ambulate with mobility tech and is now seated in recliner.  Creatinine 7.90 Urine output 1.35 L in 24 hours  Objective:  Vital signs in last 24 hours:  Temp:  [98.1 F (36.7 C)-98.8 F (37.1 C)] 98.1 F (36.7 C) (03/03 0758) Pulse Rate:  [67-72] 72 (03/03 0758) Resp:  [16-20] 20 (03/03 0758) BP: (141-157)/(61-78) 142/78 (03/03 1452) SpO2:  [98 %-100 %] 100 % (03/03 0758)  Weight change:  Filed Weights   12/30/21 0804 12/30/21 1455 12/30/21 2000  Weight: 79.5 kg 78.8 kg 77.2 kg    Intake/Output: I/O last 3 completed shifts: In: -  Out: 1650 [Urine:1650]   Intake/Output this shift:  Total I/O In: 480 [P.O.:480] Out: 250 [Urine:250]  Physical Exam: General: NAD  Head: Normocephalic, atraumatic. Moist oral mucosal membranes  Eyes: Anicteric  Lungs:  Clear to auscultation, normal effort  Heart: S1S2 no rubs  Abdomen:  Soft, nontender, bowel sounds present  Extremities:  No peripheral edema.  Neurologic: Alert following simple commands  Skin: No acute rash  Access: Rt IJ permcath placed by Dr Lucky Cowboy on 08/22/50    Basic Metabolic Panel: Recent Labs  Lab 12/31/21 0505 01/01/22 0404 01/03/22 0436 01/04/22 0451 01/05/22 0420 01/06/22 0450  NA 134* 136 137 137 139 140  K 3.0* 3.5 3.2* 3.3* 3.7 3.8  CL 98 97* 101 99 100 101  CO2 '27 29 29  29 28 30  ' GLUCOSE 95 102* 126* 101* 101* 101*  BUN 20 24* 30* 33* 35* 36*  CREATININE 5.08* 6.33* 7.54* 7.98* 8.00* 7.90*  CALCIUM 7.8* 8.1* 8.0* 8.1* 8.4* 8.5*  MG 1.7  --   --  1.9 2.0 2.0  PHOS 2.3* 3.2 3.5  --   --   --      Liver Function Tests: Recent Labs  Lab 12/31/21 0505 01/01/22 0404 01/03/22 0436  ALBUMIN 2.6* 2.6* 2.6*    No results for input(s): LIPASE, AMYLASE in the last 168 hours.  No results for input(s): AMMONIA in the last 168 hours.  CBC: Recent Labs  Lab 01/04/22 0451 01/05/22 0420  WBC 13.5* 12.2*  HGB 8.5* 8.8*  HCT 25.3* 26.6*  MCV 96.6 96.4  PLT 191 210     Cardiac Enzymes: No results for input(s): CKTOTAL, CKMB, CKMBINDEX, TROPONINI in the last 168 hours.  BNP: Invalid input(s): POCBNP  CBG: Recent Labs  Lab 01/01/22 2247 01/02/22 0837  GLUCAP 117* 90     Microbiology: Results for orders placed or performed during the hospital encounter of 12/22/21  Resp Panel by RT-PCR (Flu A&B, Covid) Nasopharyngeal Swab     Status: None   Collection Time: 12/22/21 11:18 PM   Specimen: Nasopharyngeal Swab; Nasopharyngeal(NP) swabs in vial transport medium  Result Value Ref Range Status   SARS Coronavirus 2 by RT PCR NEGATIVE NEGATIVE Final    Comment: (NOTE) SARS-CoV-2 target nucleic acids  are NOT DETECTED.  The SARS-CoV-2 RNA is generally detectable in upper respiratory specimens during the acute phase of infection. The lowest concentration of SARS-CoV-2 viral copies this assay can detect is 138 copies/mL. A negative result does not preclude SARS-Cov-2 infection and should not be used as the sole basis for treatment or other patient management decisions. A negative result may occur with  improper specimen collection/handling, submission of specimen other than nasopharyngeal swab, presence of viral mutation(s) within the areas targeted by this assay, and inadequate number of viral copies(<138 copies/mL). A negative result must be  combined with clinical observations, patient history, and epidemiological information. The expected result is Negative.  Fact Sheet for Patients:  EntrepreneurPulse.com.au  Fact Sheet for Healthcare Providers:  IncredibleEmployment.be  This test is no t yet approved or cleared by the [place] FDA and  has been authorized for detection and/or diagnosis of SARS-CoV-2 by FDA under an Emergency Use Authorization (EUA). This EUA will remain  in effect (meaning this test can be used) for the duration of the COVID-19 declaration under Section 564(b)(1) of the Act, 21 U.S.C.section 360bbb-3(b)(1), unless the authorization is terminated  or revoked sooner.       Influenza A by PCR NEGATIVE NEGATIVE Final   Influenza B by PCR NEGATIVE NEGATIVE Final    Comment: (NOTE) The Xpert Xpress SARS-CoV-2/FLU/RSV plus assay is intended as an aid in the diagnosis of influenza from Nasopharyngeal swab specimens and should not be used as a sole basis for treatment. Nasal washings and aspirates are unacceptable for Xpert Xpress SARS-CoV-2/FLU/RSV testing.  Fact Sheet for Patients: EntrepreneurPulse.com.au  Fact Sheet for Healthcare Providers: IncredibleEmployment.be  This test is not yet approved or cleared by the [place] FDA and has been authorized for detection and/or diagnosis of SARS-CoV-2 by FDA under an Emergency Use Authorization (EUA). This EUA will remain in effect (meaning this test can be used) for the duration of the COVID-19 declaration under Section 564(b)(1) of the Act, 21 U.S.C. section 360bbb-3(b)(1), unless the authorization is terminated or revoked.  Performed at [facility], [address] [place], [place] [address]     Coagulation Studies: No results for input(s): LABPROT, INR in the last 72 hours.  Urinalysis: No results for input(s): COLORURINE, LABSPEC, PHURINE, GLUCOSEU,  HGBUR, BILIRUBINUR, KETONESUR, PROTEINUR, UROBILINOGEN, NITRITE, LEUKOCYTESUR in the last 72 hours.  Invalid input(s): APPERANCEUR     Imaging: No results found.   Medications:      amLODipine  10 mg Oral Daily   ARIPiprazole  1 mg Oral Daily   aspirin EC  81 mg Oral Daily   carvedilol  25 mg Oral BID WC   Chlorhexidine Gluconate Cloth  6 each Topical Q0600   diclofenac Sodium  2 g Topical QID   feeding supplement (NEPRO CARB STEADY)  237 mL Oral TID BM   heparin injection (subcutaneous)  5,000 Units Subcutaneous Q8H   hydrALAZINE  25 mg Oral Q8H   lactulose  20 g Oral Daily   multivitamin  1 tablet Oral QHS   pantoprazole  40 mg Oral BID   polyethylene glycol  34 g Oral Daily   rosuvastatin  10 mg Oral Daily   senna  1 tablet Oral QHS   sertraline  100 mg Oral Daily   sodium bicarbonate  650 mg Oral TID   tamsulosin  0.4 mg Oral Daily   acetaminophen **OR** acetaminophen, hydrALAZINE, ondansetron **OR** ondansetron (ZOFRAN) IV, ondansetron (ZOFRAN) IV, sorbitol, milk of mag, mineral oil, glycerin (  SMOG) enema  Assessment/ Plan:  61 y.o. male with a past medical history significant for dementia, hypertension, heart failure, CKD 3a and CVA admitted to [facility] on 12/22/2021 with intractable vomiting.  Also noted to have mild leukocytosis and AKI.   #1 AKI on CKD 3a with baseline creatinine of 1.4 and EGFR of 52.  Patient's latest BUN/creatinine are 50 and 6.94 respectively.  It is felt that acute kidney injury could have been contributed from usage of Advil/NSAIDs and from dehydration secondary to intractable vomiting.   Creatinine slightly decreased today.  Urine output almost 1.4 L.  No acute need for dialysis at this time.  We will continue to monitor renal recovery and desire continued decline in creatinine.   #2 recurrent vomiting likely multifactorial including advancing dementia ,medication side effect or functional dyspepsia.  GI is consulted.  EGD completed with no signs  of active bleeding.  Did see gastritis, small hiatal hernia and LA grade B reflux esophagitis.  Concerning areas biopsied.  Denies nausea and vomiting.  Attempting to improve oral intake.    3. Anemia of chronic kidney disease  Normocytic Lab Results  Component Value Date   HGB 8.8 (L) 01/05/2022  Hemoglobin steady.  We will continue to monitor  4.   Hypertension, well controlled.  Currently receiving amlodipine, carvedilol, hydralazine, and tamsulosin.  BP 142/78   LOS: 13   3/3/20232:57 PM

## 2022-01-06 NOTE — Progress Notes (Signed)
Currently waiting on pre authorization for OON placement. Patient has been accepted at [facility] pending insurance approval.    ?

## 2022-01-06 NOTE — Plan of Care (Signed)

## 2022-01-06 NOTE — Progress Notes (Signed)
Mobility Specialist - Progress Note ? ? 01/06/22 1100  ?Mobility  ?Activity Ambulated with assistance in hallway  ?Level of Assistance Standby assist, set-up cues, supervision of patient - no hands on  ?Assistive Device Front wheel walker  ?Distance Ambulated (ft) 320 ft  ?Activity Response Tolerated well  ?$Mobility charge 1 Mobility  ? ? ?During mobility: 79 HR, 98% SpO2  ? ?Pt awake and alert on arrival, utilizing RA. Pt ambulated in hallway with supervision. No verbal cues needed for proper use of RW this date. Mild fatigue post-activity, no other complaints. Pt left in bed with needs in reach.  ? ? ?Kathee Delton ?Mobility Specialist ?01/06/22, 11:11 AM ? ? ? ? ?

## 2022-01-06 NOTE — Progress Notes (Signed)
?  Progress Note ? ? ?Patient: Randall [name] VHQ:[id number] DOB: 11/14/1960 DOA: 12/22/2021     13 ?DOS: the patient was seen and examined on 01/06/2022 ?  ?Brief hospital course: ?No notes on file ? ? ?Assessment and Plan: ?Recurrent vomiting- (present on admission) ?resolved. ?Recurrent episodes with second hospitalization in 2 weeks for the same ?No definite etiology identified. ?GI was consulted and patient underwent EGD on 12/26/2020 which shows esophagitis, some esophageal dysmotility, gastritis and few nonbleeding erosions in duodenum, biopsies were taken, which were negative. Vomiting and nausea resolved, tolerating diet ?-Continue with Protonix ? ?Anemia ?Hgb 8s from 11s on admission. No hematochezia or melena or other bleeding. Likely 2/2 new renal dysfunction ?- monitor  ? ?Hypokalemia ?Resolved w/ supplementation ? ?Essential hypertension- (present on admission) ?Blood pressure improved after adding hydralazine. ?-Hold Entresto as advised by nephrology while waiting for any renal function recovery. ?-Continue amlodipine  ?-Continue hydralazine,  ?-Continue to monitor ? ?Moderate major depression ([place]) ?- Continue Abilify and Zoloft ? ?Acute renal failure superimposed on stage 3a chronic kidney disease ([place]) ?Good urinary output . ?Nephrology was consulted and they decided to proceed with hemodialysis.  Received 3 sessions of initial dialysis with temporary catheter which was then switched with permanent cath. ?-dialysis now on hold. Making good urine but gfr remains below 15. ?-Monitor renal function ?-Avoid nephrotoxins ?- nephrology advises continuing to monitor at least through the weekend, hopeful for kidney recovery but if not will need dialysis arrangement, no chair yet found ?- has right IJ tunneled dialysis catheter placed by vascular surgery on 2/24 ? ?History of CVA (cerebrovascular accident) ?Continue aspirin and statin ? ?Chronic diastolic CHF (congestive heart failure) ([place])- (present on  admission) ?Currently euvolemic. Ef 50% on 2021 TTE w/ grade 1 dysfunction ?-Continue Coreg, Carvedilol  ?-holding entresto as above ? ?Dementia ([place])- (present on admission) ?Continue Namenda and sertraline ? ?Constipation ?Yesterday increased miralax and added senna. No bm yet ?- cont miralax/senna ?- add lactulose ? ?Subjective: Patient was seen and examined today.  No new complaint.  Tolerating diet, no pain. No bm for a couple of days ? ?Physical Exam: ?Vitals:  ? 01/05/22 1611 01/05/22 2013 01/06/22 4132 01/06/22 0758  ?BP: (!) 141/61 (!) 157/67 (!) 154/68 (!) 148/75  ?Pulse: 70 67 72 72  ?Resp: 16 18 18 20   ?Temp: 98.6 ?F (37 ?C) 98.7 ?F (37.1 ?C) 98.8 ?F (37.1 ?C) 98.1 ?F (36.7 ?C)  ?TempSrc:      ?SpO2: 98% 99% 100% 100%  ?Weight:      ?Height:      ? ?General.  In no acute distress. ?Pulmonary.  Lungs clear bilaterally, normal respiratory effort. ?CV.  Regular rate and rhythm, no JVD, rub or murmur. ?Abdomen.  Soft, nontender, nondistended, BS positive. ?CNS.  Alert and oriented .  No focal neurologic deficit. ?Extremities.  No edema, no cyanosis, pulses intact and symmetrical. ?Psychiatry.  Judgment and insight appears normal. ? ?Data Reviewed: ?Prior notes and labs reviewed. ? ?Family Communication: family updated @ bedside 3/3 ? ?Disposition: ?Status is: Inpatient ?Remains inpatient appropriate because: Severity of illness. May need outpt dialysis ? ? Planned Discharge Destination: Home with Home Health (PT/OT following) ? ?DVT prophylaxis.  Subcu heparin ? ?Time spent: 25 minutes ? ? ?Author: ?Desma Maxim, MD ?01/06/2022 2:08 PM ? ?For on call review [email or web address]  ? ?

## 2022-01-07 DIAGNOSIS — R1110 Vomiting, unspecified: Secondary | ICD-10-CM | POA: Diagnosis not present

## 2022-01-07 LAB — BASIC METABOLIC PANEL
Anion gap: 10 (ref 5–15)
BUN: 35 mg/dL — ABNORMAL HIGH (ref 6–20)
CO2: 30 mmol/L (ref 22–32)
Calcium: 8.4 mg/dL — ABNORMAL LOW (ref 8.9–10.3)
Chloride: 102 mmol/L (ref 98–111)
Creatinine, Ser: 7.74 mg/dL — ABNORMAL HIGH (ref 0.61–1.24)
GFR, Estimated: 7 mL/min — ABNORMAL LOW (ref >60)
Glucose, Bld: 92 mg/dL (ref 70–99)
Potassium: 3.7 mmol/L (ref 3.5–5.1)
Sodium: 142 mmol/L (ref 135–145)

## 2022-01-07 LAB — CBC
HCT: 26.3 % — ABNORMAL LOW (ref 39.0–52.0)
Hemoglobin: 8.7 g/dL — ABNORMAL LOW (ref 13.0–17.0)
MCH: 32.1 pg (ref 26.0–34.0)
MCHC: 33.1 g/dL (ref 30.0–36.0)
MCV: 97 fL (ref 80.0–100.0)
Platelets: 240 10*3/uL (ref 150–400)
RBC: 2.71 MIL/uL — ABNORMAL LOW (ref 4.22–5.81)
RDW: 12.6 % (ref 11.5–15.5)
WBC: 8 10*3/uL (ref 4.0–10.5)
nRBC: 0 % (ref 0.0–0.2)

## 2022-01-07 MED ORDER — HEPARIN SODIUM (PORCINE) 1000 UNIT/ML IJ SOLN
INTRAMUSCULAR | Status: AC
  Filled 2022-01-07: qty 10

## 2022-01-07 NOTE — Progress Notes (Signed)
?  Progress Note ? ? ?Patient: Randall [name] [id number] DOB: 01/08/1961 DOA: 12/22/2021     14 ?DOS: the patient was seen and examined on 01/07/2022 ?  ?Brief hospital course: ?No notes on file ? ? ?Assessment and Plan: ?Recurrent vomiting- (present on admission) ?resolved. ?Recurrent episodes with second hospitalization in 2 weeks for the same ?No definite etiology identified. ?GI was consulted and patient underwent EGD on 12/26/2020 which shows esophagitis, some esophageal dysmotility, gastritis and few nonbleeding erosions in duodenum, biopsies were taken, which were negative. Vomiting and nausea resolved, tolerating diet ?-Continue with Protonix ? ?Anemia ?Hgb 8s from 11s on admission. No hematochezia or melena or other bleeding. Likely 2/2 new renal dysfunction ?- monitor  ? ?Hypokalemia ?Resolved w/ supplementation ? ?Essential hypertension- (present on admission) ?Blood pressure improved after adding hydralazine. ?-Hold Entresto as advised by nephrology while waiting for any renal function recovery. ?-Continue amlodipine  ?-Continue hydralazine,  ?-Continue to monitor ? ?Moderate major depression ([place]) ?- Continue Abilify and Zoloft ? ?Acute renal failure superimposed on stage 3a chronic kidney disease ([place]) ?Good urinary output . ?Nephrology was consulted and they decided to proceed with hemodialysis.  Received 3 sessions of initial dialysis with temporary catheter which was then switched with permanent cath. ?-Making good urine but gfr remains below 15. ?- nephrology advises continuing to monitor at least through the weekend, hopeful for kidney recovery but if not will need dialysis arrangement, no chair yet found ?- per RN, nephrology has decided to dialyze today ?- has right IJ tunneled dialysis catheter placed by vascular surgery on 2/24 ? ?History of CVA (cerebrovascular accident) ?Continue aspirin and statin ? ?Chronic diastolic CHF (congestive heart failure) ([place])- (present on admission) ?Currently  euvolemic. Ef 50% on 2021 TTE w/ grade 1 dysfunction ?-Continue Coreg, Carvedilol  ?-holding entresto as above ? ?Dementia ([place])- (present on admission) ?Continue Namenda and sertraline ? ?Constipation ?Yesterday increased miralax and added senna. No bm yet ?- cont miralax/senna ?- add lactulose ? ?Subjective: Patient was seen and examined today.  No new complaint.  Tolerating diet, no pain.  ? ?Physical Exam: ?Vitals:  ? 01/06/22 1945 01/07/22 0502 01/07/22 0724 01/07/22 1034  ?BP: 140/75 136/76 122/87 128/86  ?Pulse: 70 72 76 77  ?Resp: 18 18 18    ?Temp: 98.2 ?F (36.8 ?C) 98 ?F (36.7 ?C) 98.4 ?F (36.9 ?C)   ?TempSrc:   Oral   ?SpO2: 100% 100% 100% 100%  ?Weight:      ?Height:      ? ?General.  In no acute distress. ?Pulmonary.  Lungs clear bilaterally, normal respiratory effort. ?CV.  Regular rate and rhythm, no JVD, rub or murmur. ?Abdomen.  Soft, nontender, nondistended, BS positive. ?CNS.  Alert and oriented .  No focal neurologic deficit. ?Extremities.  No edema, no cyanosis, pulses intact and symmetrical. ?Psychiatry.  Judgment and insight appears normal. ? ?Data Reviewed: ?Prior notes and labs reviewed. ? ?Family Communication: family updated @ bedside 3/4 ? ?Disposition: ?Status is: Inpatient ?Remains inpatient appropriate because: Severity of illness. May need outpt dialysis ? ? Planned Discharge Destination: Home with Home Health (PT/OT following) ? ?DVT prophylaxis.  Subcu heparin ? ?Time spent: 25 minutes ? ? ?Author: ?Desma Maxim, MD ?01/07/2022 2:04 PM ? ?For on call review [email or web address]  ? ?

## 2022-01-07 NOTE — Progress Notes (Signed)
Mobility Specialist - Progress Note ? ? 01/07/22 1300  ?Mobility  ?Activity Refused mobility  ? ? ? ?2nd attempt this date. Pt sitting in chair and had just received breakfast this AM at 1st attempt; now requesting to hold until after HD tx. Will attempt session another date/time.  ? ? ?Kathee Delton ?Mobility Specialist ?01/07/22, 1:37 PM ? ? ? ?

## 2022-01-07 NOTE — Progress Notes (Signed)
[facility]  ROUNDING NOTE   Subjective:  Pt is admitted to [facility] on 12/22/2021 with intractable vomiting on 12/22/2021 with intractable vomiting which has been going for weeks.  He noted to have mild leukocytosis and AKI on admission patient has a past medical history significant for dementia, hypertension, HFpEF, CKD 3a and CVA.  We were asked to see the patient for AKI with a significantly elevated BUN and creatinine  Patient currently resting quietly    Objective:  Vital signs in last 24 hours:  Temp:  [98 F (36.7 C)-98.4 F (36.9 C)] 98.4 F (36.9 C) (03/04 0724) Pulse Rate:  [66-77] 77 (03/04 1034) Resp:  [16-18] 18 (03/04 0724) BP: (122-142)/(71-87) 128/86 (03/04 1034) SpO2:  [100 %] 100 % (03/04 1034)  Weight change:  Filed Weights   12/30/21 0804 12/30/21 1455 12/30/21 2000  Weight: 79.5 kg 78.8 kg 77.2 kg    Intake/Output: I/O last 3 completed shifts: In: 480 [P.O.:480] Out: 1300 [Urine:1300]   Intake/Output this shift:  No intake/output data recorded.  Physical Exam: General: NAD  Head: Normocephalic, atraumatic. Moist oral mucosal membranes  Eyes: Anicteric  Lungs:  Clear to auscultation, normal effort  Heart: S1S2 no rubs  Abdomen:  Soft, nontender, bowel sounds present  Extremities:  No peripheral edema.  Neurologic: Alert following simple commands  Skin: No acute rash  Access: Rt IJ permcath placed by Dr Wyn Quaker on 12/30/21    Basic Metabolic Panel: Recent Labs  Lab 01/01/22 0404 01/03/22 0436 01/04/22 0451 01/05/22 0420 01/06/22 0450 01/07/22 0449  NA 136 137 137 139 140 142  K 3.5 3.2* 3.3* 3.7 3.8 3.7  CL 97* 101 99 100 101 102  CO2 29 29 29 28 30 30   GLUCOSE 102* 126* 101* 101* 101* 92  BUN 24* 30* 33* 35* 36* 35*  CREATININE 6.33* 7.54* 7.98* 8.00* 7.90* 7.74*  CALCIUM 8.1* 8.0* 8.1* 8.4* 8.5* 8.4*  MG  --   --  1.9 2.0 2.0  --   PHOS 3.2 3.5  --   --   --   --     Liver Function Tests: Recent Labs  Lab 01/01/22 0404  01/03/22 0436  ALBUMIN 2.6* 2.6*   No results for input(s): LIPASE, AMYLASE in the last 168 hours.  No results for input(s): AMMONIA in the last 168 hours.  CBC: Recent Labs  Lab 01/04/22 0451 01/05/22 0420 01/07/22 0449  WBC 13.5* 12.2* 8.0  HGB 8.5* 8.8* 8.7*  HCT 25.3* 26.6* 26.3*  MCV 96.6 96.4 97.0  PLT 191 210 240    Cardiac Enzymes: No results for input(s): CKTOTAL, CKMB, CKMBINDEX, TROPONINI in the last 168 hours.  BNP: Invalid input(s): POCBNP  CBG: Recent Labs  Lab 01/01/22 2247 01/02/22 0837  GLUCAP 117* 90    Microbiology: Results for orders placed or performed during the hospital encounter of 12/22/21  Resp Panel by RT-PCR (Flu A&B, Covid) Nasopharyngeal Swab     Status: None   Collection Time: 12/22/21 11:18 PM   Specimen: Nasopharyngeal Swab; Nasopharyngeal(NP) swabs in vial transport medium  Result Value Ref Range Status   SARS Coronavirus 2 by RT PCR NEGATIVE NEGATIVE Final    Comment: (NOTE) SARS-CoV-2 target nucleic acids are NOT DETECTED.  The SARS-CoV-2 RNA is generally detectable in upper respiratory specimens during the acute phase of infection. The lowest concentration of SARS-CoV-2 viral copies this assay can detect is 138 copies/mL. A negative result does not preclude SARS-Cov-2 infection and should not be used  as the sole basis for treatment or other patient management decisions. A negative result may occur with  improper specimen collection/handling, submission of specimen other than nasopharyngeal swab, presence of viral mutation(s) within the areas targeted by this assay, and inadequate number of viral copies(<138 copies/mL). A negative result must be combined with clinical observations, patient history, and epidemiological information. The expected result is Negative.  Fact Sheet for Patients:  BloggerCourse.com  Fact Sheet for Healthcare Providers:  SeriousBroker.it  This  test is no t yet approved or cleared by the [place] FDA and  has been authorized for detection and/or diagnosis of SARS-CoV-2 by FDA under an Emergency Use Authorization (EUA). This EUA will remain  in effect (meaning this test can be used) for the duration of the COVID-19 declaration under Section 564(b)(1) of the Act, 21 U.S.C.section 360bbb-3(b)(1), unless the authorization is terminated  or revoked sooner.       Influenza A by PCR NEGATIVE NEGATIVE Final   Influenza B by PCR NEGATIVE NEGATIVE Final    Comment: (NOTE) The Xpert Xpress SARS-CoV-2/FLU/RSV plus assay is intended as an aid in the diagnosis of influenza from Nasopharyngeal swab specimens and should not be used as a sole basis for treatment. Nasal washings and aspirates are unacceptable for Xpert Xpress SARS-CoV-2/FLU/RSV testing.  Fact Sheet for Patients: BloggerCourse.com  Fact Sheet for Healthcare Providers: SeriousBroker.it  This test is not yet approved or cleared by the [place] FDA and has been authorized for detection and/or diagnosis of SARS-CoV-2 by FDA under an Emergency Use Authorization (EUA). This EUA will remain in effect (meaning this test can be used) for the duration of the COVID-19 declaration under Section 564(b)(1) of the Act, 21 U.S.C. section 360bbb-3(b)(1), unless the authorization is terminated or revoked.  Performed at [facility], [address] [place], [place] [address]     Coagulation Studies: No results for input(s): LABPROT, INR in the last 72 hours.  Urinalysis: No results for input(s): COLORURINE, LABSPEC, PHURINE, GLUCOSEU, HGBUR, BILIRUBINUR, KETONESUR, PROTEINUR, UROBILINOGEN, NITRITE, LEUKOCYTESUR in the last 72 hours.  Invalid input(s): APPERANCEUR     Imaging: No results found.   Medications:      amLODipine  10 mg Oral Daily   ARIPiprazole  1 mg Oral Daily   aspirin EC  81 mg Oral  Daily   carvedilol  25 mg Oral BID WC   Chlorhexidine Gluconate Cloth  6 each Topical Q0600   diclofenac Sodium  2 g Topical QID   feeding supplement (NEPRO CARB STEADY)  237 mL Oral TID BM   heparin injection (subcutaneous)  5,000 Units Subcutaneous Q8H   hydrALAZINE  25 mg Oral Q8H   lactulose  20 g Oral Daily   multivitamin  1 tablet Oral QHS   pantoprazole  40 mg Oral BID   polyethylene glycol  34 g Oral Daily   rosuvastatin  10 mg Oral Daily   senna  1 tablet Oral QHS   sertraline  100 mg Oral Daily   sodium bicarbonate  650 mg Oral TID   tamsulosin  0.4 mg Oral Daily   acetaminophen **OR** acetaminophen, hydrALAZINE, ondansetron **OR** ondansetron (ZOFRAN) IV, ondansetron (ZOFRAN) IV, sorbitol, milk of mag, mineral oil, glycerin (SMOG) enema  Assessment/ Plan:  61 y.o. male with a past medical history significant for dementia, hypertension, heart failure, CKD 3a and CVA admitted to [facility] on 12/22/2021 with intractable vomiting.  Also noted to have mild leukocytosis and AKI.   #1 AKI on CKD  3a with baseline creatinine of 1.4 and EGFR of 52.    It is felt that acute kidney injury could have been contributed from usage of Advil/NSAIDs and from dehydration secondary to intractable vomiting.   Patient creatinine remains high at around 8. Patient will require renal replacement therapy for now as patient creatinine since his last dialysis has increased steadily from 5. To -6.3 to 7.5 to now around 7.7-- 8. We will dialyze patient today We will continue to monitor renal recovery.    #2 recurrent vomiting likely multifactorial including advancing dementia ,medication side effect or functional dyspepsia.  GI is consulted.  EGD completed with no signs of active bleeding.  Did see gastritis, small hiatal hernia and LA grade B reflux esophagitis.  Concerning areas biopsied.  Denies nausea and vomiting.  Attempting to improve oral intake.    3. Anemia of chronic kidney disease   Normocytic Lab Results  Component Value Date   HGB 8.7 (L) 01/07/2022  Hemoglobin steady.  We will continue to monitor  4.   Hypertension, well controlled.  Currently receiving amlodipine, carvedilol, hydralazine, and tamsulosin.    LOS: 14 Anevay Campanella s Theus Espin 3/4/20232:51 PM

## 2022-01-08 DIAGNOSIS — R1110 Vomiting, unspecified: Secondary | ICD-10-CM | POA: Diagnosis not present

## 2022-01-08 LAB — BASIC METABOLIC PANEL
Anion gap: 11 (ref 5–15)
BUN: 20 mg/dL (ref 6–20)
CO2: 30 mmol/L (ref 22–32)
Calcium: 8.4 mg/dL — ABNORMAL LOW (ref 8.9–10.3)
Chloride: 99 mmol/L (ref 98–111)
Creatinine, Ser: 5 mg/dL — ABNORMAL HIGH (ref 0.61–1.24)
GFR, Estimated: 12 mL/min — ABNORMAL LOW (ref >60)
Glucose, Bld: 83 mg/dL (ref 70–99)
Potassium: 3.5 mmol/L (ref 3.5–5.1)
Sodium: 140 mmol/L (ref 135–145)

## 2022-01-08 NOTE — Progress Notes (Signed)
[facility]  ROUNDING NOTE   Subjective:  Pt is admitted to [facility] on 12/22/2021 with intractable vomiting on 12/22/2021 with intractable vomiting which has been going for weeks.  He noted to have mild leukocytosis and AKI on admission patient has a past medical history significant for dementia, hypertension, HFpEF, CKD 3a and CVA.  We were asked to see the patient for AKI with a significantly elevated BUN and creatinine  Patient seen resting comfortably, sister at bedside Alert and oriented Reports improved appetite States he feels better since dialysis yesterday.  Creatinine 5.0  Objective:  Vital signs in last 24 hours:  Temp:  [97.9 F (36.6 C)-98.9 F (37.2 C)] 98.9 F (37.2 C) (03/05 0827) Pulse Rate:  [63-77] 77 (03/05 0932) Resp:  [9-18] 18 (03/05 0827) BP: (131-158)/(57-103) 132/57 (03/05 0932) SpO2:  [98 %-100 %] 99 % (03/05 0932) Weight:  [74.6 kg-75.1 kg] 74.6 kg (03/04 1808)  Weight change:  Filed Weights   12/30/21 2000 01/07/22 1516 01/07/22 1808  Weight: 77.2 kg 75.1 kg 74.6 kg    Intake/Output: I/O last 3 completed shifts: In: -  Out: 302 [Urine:300; Other:2]   Intake/Output this shift:  No intake/output data recorded.  Physical Exam: General: NAD  Head: Normocephalic, atraumatic. Moist oral mucosal membranes  Eyes: Anicteric  Lungs:  Clear to auscultation, normal effort  Heart: S1S2 no rubs  Abdomen:  Soft, nontender, bowel sounds present  Extremities:  No peripheral edema.  Neurologic: Alert following simple commands  Skin: No acute rash  Access: Rt IJ permcath placed by Dr Lucky Cowboy on 17/51/02    Basic Metabolic Panel: Recent Labs  Lab 01/03/22 0436 01/04/22 0451 01/05/22 0420 01/06/22 0450 01/07/22 0449 01/08/22 0552  NA 137 137 139 140 142 140  K 3.2* 3.3* 3.7 3.8 3.7 3.5  CL 101 99 100 101 102 99  CO2 '29 29 28 30 30 30  ' GLUCOSE 126* 101* 101* 101* 92 83  BUN 30* 33* 35* 36* 35* 20  CREATININE 7.54* 7.98* 8.00* 7.90* 7.74*  5.00*  CALCIUM 8.0* 8.1* 8.4* 8.5* 8.4* 8.4*  MG  --  1.9 2.0 2.0  --   --   PHOS 3.5  --   --   --   --   --      Liver Function Tests: Recent Labs  Lab 01/03/22 0436  ALBUMIN 2.6*    No results for input(s): LIPASE, AMYLASE in the last 168 hours.  No results for input(s): AMMONIA in the last 168 hours.  CBC: Recent Labs  Lab 01/04/22 0451 01/05/22 0420 01/07/22 0449  WBC 13.5* 12.2* 8.0  HGB 8.5* 8.8* 8.7*  HCT 25.3* 26.6* 26.3*  MCV 96.6 96.4 97.0  PLT 191 210 240     Cardiac Enzymes: No results for input(s): CKTOTAL, CKMB, CKMBINDEX, TROPONINI in the last 168 hours.  BNP: Invalid input(s): POCBNP  CBG: Recent Labs  Lab 01/01/22 2247 01/02/22 0837  GLUCAP 117* 90     Microbiology: Results for orders placed or performed during the hospital encounter of 12/22/21  Resp Panel by RT-PCR (Flu A&B, Covid) Nasopharyngeal Swab     Status: None   Collection Time: 12/22/21 11:18 PM   Specimen: Nasopharyngeal Swab; Nasopharyngeal(NP) swabs in vial transport medium  Result Value Ref Range Status   SARS Coronavirus 2 by RT PCR NEGATIVE NEGATIVE Final    Comment: (NOTE) SARS-CoV-2 target nucleic acids are NOT DETECTED.  The SARS-CoV-2 RNA is generally detectable in upper respiratory specimens during the  acute phase of infection. The lowest concentration of SARS-CoV-2 viral copies this assay can detect is 138 copies/mL. A negative result does not preclude SARS-Cov-2 infection and should not be used as the sole basis for treatment or other patient management decisions. A negative result may occur with  improper specimen collection/handling, submission of specimen other than nasopharyngeal swab, presence of viral mutation(s) within the areas targeted by this assay, and inadequate number of viral copies(<138 copies/mL). A negative result must be combined with clinical observations, patient history, and epidemiological information. The expected result is  Negative.  Fact Sheet for Patients:  EntrepreneurPulse.com.au  Fact Sheet for Healthcare Providers:  IncredibleEmployment.be  This test is no t yet approved or cleared by the [place] FDA and  has been authorized for detection and/or diagnosis of SARS-CoV-2 by FDA under an Emergency Use Authorization (EUA). This EUA will remain  in effect (meaning this test can be used) for the duration of the COVID-19 declaration under Section 564(b)(1) of the Act, 21 U.S.C.section 360bbb-3(b)(1), unless the authorization is terminated  or revoked sooner.       Influenza A by PCR NEGATIVE NEGATIVE Final   Influenza B by PCR NEGATIVE NEGATIVE Final    Comment: (NOTE) The Xpert Xpress SARS-CoV-2/FLU/RSV plus assay is intended as an aid in the diagnosis of influenza from Nasopharyngeal swab specimens and should not be used as a sole basis for treatment. Nasal washings and aspirates are unacceptable for Xpert Xpress SARS-CoV-2/FLU/RSV testing.  Fact Sheet for Patients: EntrepreneurPulse.com.au  Fact Sheet for Healthcare Providers: IncredibleEmployment.be  This test is not yet approved or cleared by the [place] FDA and has been authorized for detection and/or diagnosis of SARS-CoV-2 by FDA under an Emergency Use Authorization (EUA). This EUA will remain in effect (meaning this test can be used) for the duration of the COVID-19 declaration under Section 564(b)(1) of the Act, 21 U.S.C. section 360bbb-3(b)(1), unless the authorization is terminated or revoked.  Performed at [facility], [address] [place], [place] [address]     Coagulation Studies: No results for input(s): LABPROT, INR in the last 72 hours.  Urinalysis: No results for input(s): COLORURINE, LABSPEC, PHURINE, GLUCOSEU, HGBUR, BILIRUBINUR, KETONESUR, PROTEINUR, UROBILINOGEN, NITRITE, LEUKOCYTESUR in the last 72 hours.  Invalid  input(s): APPERANCEUR     Imaging: No results found.   Medications:      amLODipine  10 mg Oral Daily   ARIPiprazole  1 mg Oral Daily   aspirin EC  81 mg Oral Daily   carvedilol  25 mg Oral BID WC   Chlorhexidine Gluconate Cloth  6 each Topical Q0600   diclofenac Sodium  2 g Topical QID   feeding supplement (NEPRO CARB STEADY)  237 mL Oral TID BM   heparin injection (subcutaneous)  5,000 Units Subcutaneous Q8H   hydrALAZINE  25 mg Oral Q8H   lactulose  20 g Oral Daily   multivitamin  1 tablet Oral QHS   pantoprazole  40 mg Oral BID   polyethylene glycol  34 g Oral Daily   rosuvastatin  10 mg Oral Daily   senna  1 tablet Oral QHS   sertraline  100 mg Oral Daily   sodium bicarbonate  650 mg Oral TID   tamsulosin  0.4 mg Oral Daily   acetaminophen **OR** acetaminophen, hydrALAZINE, ondansetron **OR** ondansetron (ZOFRAN) IV, ondansetron (ZOFRAN) IV, sorbitol, milk of mag, mineral oil, glycerin (SMOG) enema  Assessment/ Plan:  61 y.o. male with a past medical history significant for  dementia, hypertension, heart failure, CKD 3a and CVA admitted to [facility] on 12/22/2021 with intractable vomiting.  Also noted to have mild leukocytosis and AKI.   #1 AKI on CKD 3a with baseline creatinine of 1.4 and EGFR of 52.    It is felt that acute kidney injury could have been contributed from usage of Advil/NSAIDs and from dehydration secondary to intractable vomiting.   Patient creatinine remained high at around 8. Patient will require renal replacement therapy for now as patient creatinine since his last dialysis has increased steadily from 5. To -6.3 to 7.5 to around 7.7-- 8. Patient received dialysis yesterday, no UF.  Creatinine has improved to 5 today.  We will continue to monitor patient for renal recovery with needed labs and urine output.  We will evaluate need for dialysis daily.   #2 recurrent vomiting likely multifactorial including advancing dementia ,medication side effect or  functional dyspepsia.  GI is consulted.  EGD completed with no signs of active bleeding.  Did see gastritis, small hiatal hernia and LA grade B reflux esophagitis.  Concerning areas biopsied.  Resolved    3. Anemia of chronic kidney disease  Normocytic Lab Results  Component Value Date   HGB 8.7 (L) 01/07/2022  Hemoglobin remains steady from 8.5-8.7  4.   Hypertension, well controlled.  Currently receiving amlodipine, carvedilol, hydralazine, and tamsulosin.   BP currently 132/57   LOS: [place] 3/5/202311:44 AM

## 2022-01-08 NOTE — Progress Notes (Signed)
Mobility Specialist - Progress Note ? ? 01/08/22 1455  ?Mobility  ?Activity Ambulated with assistance in hallway;Stood at bedside;Dangled on edge of bed  ?Level of Assistance Standby assist, set-up cues, supervision of patient - no hands on  ?Assistive Device Front wheel walker  ?Distance Ambulated (ft) 160 ft  ?Activity Response Tolerated well  ?$Mobility charge 1 Mobility  ? ? ? ?During mobility: 76 HR, BP, 98% SpO2 ? ?Pt in bed upon arrival using RA. Pt sat EOB indep, STS with supervision and ambulated 160 ft with supervision. Pt denied any SOB or dizziness. Pt returned to bed with alarm set, needs in reach and family at bedside. ? ?Merrily Brittle ?Mobility Specialist ?01/08/22, 2:58 PM ? ? ? ?

## 2022-01-08 NOTE — Progress Notes (Signed)
?  Progress Note ? ? ?Patient: Randall [name] KPT:[id number] DOB: 1961/06/13 DOA: 12/22/2021     15 ?DOS: the patient was seen and examined on 01/08/2022 ?  ?Brief hospital course: ?No notes on file ? ? ?Assessment and Plan: ?Recurrent vomiting- (present on admission) ?resolved. ?Recurrent episodes with second hospitalization in 2 weeks for the same ?No definite etiology identified. ?GI was consulted and patient underwent EGD on 12/26/2020 which shows esophagitis, some esophageal dysmotility, gastritis and few nonbleeding erosions in duodenum, biopsies were taken, which were negative. Vomiting and nausea resolved, tolerating diet ?-Continue with Protonix ? ?Anemia ?Hgb 8s from 11s on admission. No hematochezia or melena or other bleeding. Likely 2/2 new renal dysfunction ?- monitor  ? ?Hypokalemia ?Resolved w/ supplementation ? ?Essential hypertension- (present on admission) ?Blood pressure improved after adding hydralazine. ?-Hold Entresto as advised by nephrology while waiting for any renal function recovery. ?-Continue amlodipine  ?-Continue hydralazine,  ?-Continue to monitor ? ?Moderate major depression ([place]) ?- Continue Abilify and Zoloft ? ?Acute renal failure superimposed on stage 3a chronic kidney disease ([place]) ?Good urinary output . ?Nephrology was consulted and they decided to proceed with hemodialysis.  Received 3 sessions of initial dialysis with temporary catheter which was then switched with permanent cath. ?-Making good urine but gfr remains below 15. ?- nephrology advises continuing to monitor at least through the weekend, hopeful for kidney recovery but if not will need dialysis arrangement, no chair yet found ?- last dialysis was 3/4 ?- has right IJ tunneled dialysis catheter placed by vascular surgery on 2/24 ? ?History of CVA (cerebrovascular accident) ?Continue aspirin and statin ? ?Chronic diastolic CHF (congestive heart failure) ([place])- (present on admission) ?Currently euvolemic. Ef 50% on 2021  TTE w/ grade 1 dysfunction ?-Continue Coreg, Carvedilol  ?-holding entresto as above ? ?Dementia ([place])- (present on admission) ?Continue Namenda and sertraline ? ?Constipation ?Large BM this morning ?- cont miralax/senna ?- stop lactulose ? ?Subjective: Patient was seen and examined today.  No new complaint.  Tolerating diet, no pain. Had bm earlier this morning ? ?Physical Exam: ?Vitals:  ? 01/07/22 1847 01/08/22 1700 01/08/22 0827 01/08/22 0932  ?BP: 139/65 (!) 145/72 (!) 153/64 (!) 132/57  ?Pulse: 65 66 72 77  ?Resp: 18 18 18    ?Temp: 98 ?F (36.7 ?C) 98.2 ?F (36.8 ?C) 98.9 ?F (37.2 ?C)   ?TempSrc: Oral  Oral   ?SpO2: 100% 100% 99% 99%  ?Weight:      ?Height:      ? ?General.  In no acute distress. ?Pulmonary.  Lungs clear bilaterally, normal respiratory effort. ?CV.  Regular rate and rhythm, no JVD, rub or murmur. ?Abdomen.  Soft, nontender, nondistended, BS positive. ?CNS.  Alert and oriented .  No focal neurologic deficit. ?Extremities.  No edema, no cyanosis, pulses intact and symmetrical. ?Psychiatry.  Judgment and insight appears normal. ? ?Data Reviewed: ?Prior notes and labs reviewed. ? ?Family Communication: family updated @ bedside 3/5 ? ?Disposition: ?Status is: Inpatient ?Remains inpatient appropriate because: unsafe d/c plan (no dialysis chair yet) ? ? Planned Discharge Destination: Home with Home Health (PT/OT following) ? ?DVT prophylaxis.  Subcu heparin ? ?Time spent: 20 minutes ? ? ?Author: ?Desma Maxim, MD ?01/08/2022 1:15 PM ? ?For on call review [email or web address].  ? ?

## 2022-01-09 DIAGNOSIS — R1110 Vomiting, unspecified: Secondary | ICD-10-CM | POA: Diagnosis not present

## 2022-01-09 LAB — BASIC METABOLIC PANEL
Anion gap: 9 (ref 5–15)
BUN: 23 mg/dL — ABNORMAL HIGH (ref 6–20)
CO2: 31 mmol/L (ref 22–32)
Calcium: 8.4 mg/dL — ABNORMAL LOW (ref 8.9–10.3)
Chloride: 100 mmol/L (ref 98–111)
Creatinine, Ser: 5.76 mg/dL — ABNORMAL HIGH (ref 0.61–1.24)
GFR, Estimated: 11 mL/min — ABNORMAL LOW (ref >60)
Glucose, Bld: 85 mg/dL (ref 70–99)
Potassium: 3.5 mmol/L (ref 3.5–5.1)
Sodium: 140 mmol/L (ref 135–145)

## 2022-01-09 MED ORDER — HYDRALAZINE HCL 50 MG PO TABS
50.0000 mg | ORAL_TABLET | Freq: Three times a day (TID) | ORAL | Status: DC
Start: 2022-01-09 — End: 2022-01-12
  Administered 2022-01-09 – 2022-01-12 (×9): 50 mg via ORAL
  Filled 2022-01-09 (×9): qty 1

## 2022-01-09 NOTE — Progress Notes (Signed)
Patient insurance denied OON authorization. [facility] admissions is in the process of appealing denial.  ?

## 2022-01-09 NOTE — Progress Notes (Signed)
Occupational Therapy Treatment ?Patient Details ?Name: Randall [name] ?MRN: [id number] ?DOB: 02-19-1961 ?Today's Date: 01/09/2022 ? ? ?History of present illness Randall [name] is a 61 y.o. male seen for evaluation of recurrent vomiting at the request of Dr. Judd Gaudier. Patient has a PMH of dementia, hypertension, diabetes, chronic kidney disease, CHF.  Patient presented to the [facility] ED for chief complaint of nausea, vomiting and weakness. Upon presentation to the ED yesterday, vital signs were stable with blood pressure 120/72, heart rate 67, respirations 18, temperature 98.7, pulse oximetry 97%. Labs were significant for new AKI with Cr 3.5, normal electrolytes GFR 19, glucose 176, WBC 17.9, UA unremarkable.  Imaging studies revealed CT of the abdomen pelvis revealed a possible tiny hiatal hernia, constipation, otherwise no acute intra-abdominal or intrapelvic abnormality.  Aortic atherosclerosis.  He is being admitted for IV hydration.  GI is consulted for evaluation and management of recurrent vomiting. ?  ?OT comments ? Upon entering the room, pt supine in bed with no c/o pain this session. Pt is agreeable to OT intervention. Pt performed bed mobility without assistance and stands with supervision. Pt needing min guard to ambulate to bathroom for toileting needs. Pt able to manage LB clothing with min guard for balance while standing to urinate. Hand hygiene with supervision and pt dons mask with set up A. Pt ambulates with min guard progressing to close supervision 180' around RN station without use of RW. Pt also demonstrating ability to ambulate up and down 7 steps with use of 1 hand rail( similar to home set up) with min guard. Pt returning to bed at end of session with all needs within reach.  ? ?Recommendations for follow up therapy are one component of a multi-disciplinary discharge planning process, led by the attending physician.  Recommendations may be updated based on patient status, additional  functional criteria and insurance authorization. ?   ?Follow Up Recommendations ? Home health OT  ?  ?Assistance Recommended at Discharge Intermittent Supervision/Assistance  ?Patient can return home with the following ? A little help with walking and/or transfers;Assistance with cooking/housework;Assist for transportation;Direct supervision/assist for medications management;Direct supervision/assist for financial management;Help with stairs or ramp for entrance;A little help with bathing/dressing/bathroom ?  ?Equipment Recommendations ? BSC/3in1  ?  ?   ?Precautions / Restrictions Precautions ?Precautions: Fall ?Restrictions ?Weight Bearing Restrictions: No  ? ? ?  ? ?Mobility Bed Mobility ?Overal bed mobility: Modified Independent ?  ?  ?  ?  ?  ?  ?General bed mobility comments: no physical assist needed ?  ? ?Transfers ?Overall transfer level: Needs assistance ?Equipment used: None ?Transfers: Sit to/from Stand ?Sit to Stand: Supervision ?  ?  ?  ?  ?  ?General transfer comment: VC's for hand placement ?  ?  ?Balance Overall balance assessment: Needs assistance ?Sitting-balance support: Feet supported ?Sitting balance-Leahy Scale: Good ?Sitting balance - Comments: good static and dynamic balance during bathing/dressing ?  ?Standing balance support: No upper extremity supported, During functional activity ?Standing balance-Leahy Scale: Fair ?Standing balance comment: min guard assist for dynamic standing tasks without use of AD ?  ?  ?  ?  ?  ?  ?  ?  ?  ?  ?  ?   ? ?ADL either performed or assessed with clinical judgement  ? ?ADL Overall ADL's : Needs assistance/impaired ?  ?  ?Grooming: Wash/dry hands;Wash/dry face;Standing;Supervision/safety ?  ?  ?  ?  ?  ?  ?  ?  ?  ?  ?  ?  Toileting- Clothing Manipulation and Hygiene: Supervision/safety;Sit to/from stand ?Toileting - Clothing Manipulation Details (indicate cue type and reason): Pt standing for toileting needs and clothing management with supervision for  safety ?  ?  ?Functional mobility during ADLs: Supervision/safety;Min guard ?General ADL Comments: Pt initially min guard progressing to close supervision with mobility. ?  ? ?Extremity/Trunk Assessment Upper Extremity Assessment ?Upper Extremity Assessment: Generalized weakness ?LUE Deficits / Details: hx chronic shoulder pain, limiting shoulder flexion to less than 45* AROM ?  ?Lower Extremity Assessment ?Lower Extremity Assessment: Generalized weakness ?  ?  ?  ? ?Vision Patient Visual Report: No change from baseline ?  ?  ?   ?   ? ?Cognition Arousal/Alertness: Awake/alert ?Behavior During Therapy: WFL for tasks assessed/performed, Flat affect ?  ?  ?  ?  ?  ?  ?  ?  ?  ?  ?  ?  ?  ?Following Commands: Follows one step commands consistently ?  ?  ?  ?General Comments: Pt with slow processing and cuing for sequencing and initiation ?  ?  ?   ?   ?   ?   ? ? ?Pertinent Vitals/ Pain       Pain Assessment ?Pain Assessment: No/denies pain ? ?   ?   ? ?Frequency ? Min 2X/week  ? ? ? ? ?  ?Progress Toward Goals ? ?OT Goals(current goals can now be found in the care plan section) ? Progress towards OT goals: Progressing toward goals ? ?Acute Rehab OT Goals ?Patient Stated Goal: to go home ?OT Goal Formulation: With patient/family ?Time For Goal Achievement: 01/19/22 ?Potential to Achieve Goals: Good  ?Plan Discharge plan remains appropriate;Frequency remains appropriate   ? ?   ?AM-PAC OT "6 Clicks" Daily Activity     ?Outcome Measure ? ? Help from another person eating meals?: None ?Help from another person taking care of personal grooming?: None ?Help from another person toileting, which includes using toliet, bedpan, or urinal?: A Little ?Help from another person bathing (including washing, rinsing, drying)?: A Little ?Help from another person to put on and taking off regular upper body clothing?: None ?Help from another person to put on and taking off regular lower body clothing?: A Little ?6 Click Score: 21 ? ?   ?End of Session Equipment Utilized During Treatment: Rolling walker (2 wheels) ? ?OT Visit Diagnosis: Other abnormalities of gait and mobility (R26.89) ?  ?Activity Tolerance Patient tolerated treatment well ?  ?Patient Left in bed;with call bell/phone within reach;with bed alarm set;with family/visitor present ?  ?Nurse Communication Mobility status ?  ? ?   ? ?Time: 0940-1003 ?OT Time Calculation (min): 23 min ? ?Charges: OT General Charges ?$OT Visit: 1 Visit ?OT Treatments ?$Self Care/Home Management : 8-22 mins ?$Therapeutic Activity: 8-22 mins ? ?Darleen Crocker, [place], OTR/L , CBIS ?ascom [phone]  ?01/09/22, 12:50 PM  ?

## 2022-01-09 NOTE — Progress Notes (Signed)
?  Progress Note ? ? ?Patient: TEDRIC LEETH FRT:[id number] DOB: Sep 22, 1961 DOA: 12/22/2021     16 ?DOS: the patient was seen and examined on 01/09/2022 ?  ?Brief hospital course: ?No notes on file ? ? ?Assessment and Plan: ?Recurrent vomiting- (present on admission) ?resolved. ?Recurrent episodes with second hospitalization in 2 weeks for the same ?No definite etiology identified. ?GI was consulted and patient underwent EGD on 12/26/2020 which shows esophagitis, some esophageal dysmotility, gastritis and few nonbleeding erosions in duodenum, biopsies were taken, which were negative. Vomiting and nausea resolved, tolerating diet ?-Continue with Protonix ? ?Anemia ?Hgb 8s from 11s on admission. No hematochezia or melena or other bleeding. Likely 2/2 new renal dysfunction ?- monitor  ? ?Hypokalemia ?Resolved w/ supplementation ? ?Essential hypertension- (present on admission) ?Blood pressure improved after adding hydralazine. ?-Hold Entresto as advised by nephrology while waiting for any renal function recovery. ?-Continue amlodipine  ?-Continue hydralazine, increase dose today ?-Continue to monitor ? ?Moderate major depression ([place] ) ?- Continue Abilify and Zoloft ? ?Acute renal failure superimposed on stage 3a chronic kidney disease ([place]) ?Good urinary output . ?Nephrology was consulted and they decided to proceed with hemodialysis.  Received 3 sessions of initial dialysis with temporary catheter which was then switched with permanent cath. ?-Making good urine but gfr remains below 15. ?- nephrology advises continuing to monitor at least through the weekend, hopeful for kidney recovery but if not will need dialysis arrangement, no chair yet found ?- last dialysis was 3/4 ?- has right IJ tunneled dialysis catheter placed by vascular surgery on 2/24 ? ?History of CVA (cerebrovascular accident) ?Continue aspirin and statin ? ?Chronic diastolic CHF (congestive heart failure) ([place])- (present on admission) ?Currently  euvolemic. Ef 50% on 2021 TTE w/ grade 1 dysfunction ?-Continue Coreg, Carvedilol  ?-holding entresto as above ? ?Dementia ([place])- (present on admission) ?Continue Namenda and sertraline ? ?Constipation ?Large BM 3/5 ?- cont miralax/senna ? ?Subjective: Patient was seen and examined today.  No new complaint.  Tolerating diet, no pain. Wants to go outside ? ?Physical Exam: ?Vitals:  ? 01/08/22 1534 01/08/22 1934 01/09/22 0416 01/09/22 0744  ?BP: 129/68 (!) 143/65 132/68 (!) 160/75  ?Pulse: 69 66 72 76  ?Resp: 20 19 17 18   ?Temp: 98.5 ?F (36.9 ?C) 98.5 ?F (36.9 ?C) 98.4 ?F (36.9 ?C) 98.3 ?F (36.8 ?C)  ?TempSrc: Oral Oral  Oral  ?SpO2: 100% 100% 100% 99%  ?Weight:      ?Height:      ? ?General.  In no acute distress. ?Pulmonary.  Lungs clear bilaterally, normal respiratory effort. ?CV.  Regular rate and rhythm, no JVD, rub or murmur. ?Abdomen.  Soft, nontender, nondistended, BS positive. ?CNS.  Alert and oriented .  No focal neurologic deficit. ?Extremities.  No edema, no cyanosis, pulses intact and symmetrical. ?Psychiatry.  Judgment and insight appears normal. ? ?Data Reviewed: ?Prior notes and labs reviewed. ? ?Family Communication: family updated @ bedside 3/6 ? ?Disposition: ?Status is: Inpatient ?Remains inpatient appropriate because: unsafe d/c plan (no dialysis chair yet) ? ? Planned Discharge Destination: Home with Home Health (PT/OT following) ? ?DVT prophylaxis.  Subcu heparin ? ?Time spent: 20 minutes ? ? ?Author: ?Desma Maxim, MD ?01/09/2022 3:41 PM ? ?For on call review [email or web address].  ? ?

## 2022-01-09 NOTE — Progress Notes (Signed)
[facility]  ROUNDING NOTE   Subjective:  Pt is admitted to [facility] on 12/22/2021 with intractable vomiting on 12/22/2021 with intractable vomiting which has been going for weeks.  He noted to have mild leukocytosis and AKI on admission patient has a past medical history significant for dementia, hypertension, HFpEF, CKD 3a and CVA.  We were asked to see the patient for AKI with a significantly elevated BUN and creatinine  Patient seen sitting up in bed, breakfast tray at bedside Patient states he would attempt to eat breakfast and/or drink Ensure Reports ambulating to the nurses station yesterday Denies shortness of breath or cough  Creatinine 5.8   Objective:  Vital signs in last 24 hours:  Temp:  [98.3 F (36.8 C)-98.5 F (36.9 C)] 98.3 F (36.8 C) (03/06 0744) Pulse Rate:  [66-76] 76 (03/06 0744) Resp:  [17-20] 18 (03/06 0744) BP: (129-160)/(65-75) 160/75 (03/06 0744) SpO2:  [99 %-100 %] 99 % (03/06 0744)  Weight change:  Filed Weights   12/30/21 2000 01/07/22 1516 01/07/22 1808  Weight: 77.2 kg 75.1 kg 74.6 kg    Intake/Output: I/O last 3 completed shifts: In: -  Out: 800 [Urine:800]   Intake/Output this shift:  Total I/O In: 180 [P.O.:180] Out: -   Physical Exam: General: NAD  Head: Normocephalic, atraumatic. Moist oral mucosal membranes  Eyes: Anicteric  Lungs:  Clear to auscultation, normal effort  Heart: S1S2 no rubs  Abdomen:  Soft, nontender, bowel sounds present  Extremities:  No peripheral edema.  Neurologic: Alert following simple commands  Skin: No acute rash  Access: Rt IJ permcath placed by Dr Lucky Cowboy on 89/57/02    Basic Metabolic Panel: Recent Labs  Lab 01/03/22 0436 01/04/22 0451 01/05/22 0420 01/06/22 0450 01/07/22 0449 01/08/22 0552 01/09/22 0544  NA 137 137 139 140 142 140 140  K 3.2* 3.3* 3.7 3.8 3.7 3.5 3.5  CL 101 99 100 101 102 99 100  CO2 _0 GLUCOSE 126* 101* 101* 101* 92 83 85  BUN 30* 33* 35*  36* 35* 20 23*  CREATININE 7.54* 7.98* 8.00* 7.90* 7.74* 5.00* 5.76*  CALCIUM 8.0* 8.1* 8.4* 8.5* 8.4* 8.4* 8.4*  MG  --  1.9 2.0 2.0  --   --   --   PHOS 3.5  --   --   --   --   --   --      Liver Function Tests: Recent Labs  Lab 01/03/22 0436  ALBUMIN 2.6*    No results for input(s): LIPASE, AMYLASE in the last 168 hours.  No results for input(s): AMMONIA in the last 168 hours.  CBC: Recent Labs  Lab 01/04/22 0451 01/05/22 0420 01/07/22 0449  WBC 13.5* 12.2* 8.0  HGB 8.5* 8.8* 8.7*  HCT 25.3* 26.6* 26.3*  MCV 96.6 96.4 97.0  PLT 191 210 240     Cardiac Enzymes: No results for input(s): CKTOTAL, CKMB, CKMBINDEX, TROPONINI in the last 168 hours.  BNP: Invalid input(s): POCBNP  CBG: No results for input(s): GLUCAP in the last 168 hours.   Microbiology: Results for orders placed or performed during the hospital encounter of 12/22/21  Resp Panel by RT-PCR (Flu A&B, Covid) Nasopharyngeal Swab     Status: None   Collection Time: 12/22/21 11:18 PM   Specimen: Nasopharyngeal Swab; Nasopharyngeal(NP) swabs in vial transport medium  Result Value Ref Range Status   SARS Coronavirus 2 by RT PCR NEGATIVE NEGATIVE Final    Comment: (  NOTE) SARS-CoV-2 target nucleic acids are NOT DETECTED.  The SARS-CoV-2 RNA is generally detectable in upper respiratory specimens during the acute phase of infection. The lowest concentration of SARS-CoV-2 viral copies this assay can detect is 138 copies/mL. A negative result does not preclude SARS-Cov-2 infection and should not be used as the sole basis for treatment or other patient management decisions. A negative result may occur with  improper specimen collection/handling, submission of specimen other than nasopharyngeal swab, presence of viral mutation(s) within the areas targeted by this assay, and inadequate number of viral copies(<138 copies/mL). A negative result must be combined with clinical observations, patient history,  and epidemiological information. The expected result is Negative.  Fact Sheet for Patients:  BloggerCourse.com  Fact Sheet for Healthcare Providers:  SeriousBroker.it  This test is no t yet approved or cleared by the [place] FDA and  has been authorized for detection and/or diagnosis of SARS-CoV-2 by FDA under an Emergency Use Authorization (EUA). This EUA will remain  in effect (meaning this test can be used) for the duration of the COVID-19 declaration under Section 564(b)(1) of the Act, 21 U.S.C.section 360bbb-3(b)(1), unless the authorization is terminated  or revoked sooner.       Influenza A by PCR NEGATIVE NEGATIVE Final   Influenza B by PCR NEGATIVE NEGATIVE Final    Comment: (NOTE) The Xpert Xpress SARS-CoV-2/FLU/RSV plus assay is intended as an aid in the diagnosis of influenza from Nasopharyngeal swab specimens and should not be used as a sole basis for treatment. Nasal washings and aspirates are unacceptable for Xpert Xpress SARS-CoV-2/FLU/RSV testing.  Fact Sheet for Patients: BloggerCourse.com  Fact Sheet for Healthcare Providers: SeriousBroker.it  This test is not yet approved or cleared by the [place] FDA and has been authorized for detection and/or diagnosis of SARS-CoV-2 by FDA under an Emergency Use Authorization (EUA). This EUA will remain in effect (meaning this test can be used) for the duration of the COVID-19 declaration under Section 564(b)(1) of the Act, 21 U.S.C. section 360bbb-3(b)(1), unless the authorization is terminated or revoked.  Performed at [facility], [address] [place], [place] [address]     Coagulation Studies: No results for input(s): LABPROT, INR in the last 72 hours.  Urinalysis: No results for input(s): COLORURINE, LABSPEC, PHURINE, GLUCOSEU, HGBUR, BILIRUBINUR, KETONESUR, PROTEINUR, UROBILINOGEN,  NITRITE, LEUKOCYTESUR in the last 72 hours.  Invalid input(s): APPERANCEUR     Imaging: No results found.   Medications:      amLODipine  10 mg Oral Daily   ARIPiprazole  1 mg Oral Daily   aspirin EC  81 mg Oral Daily   carvedilol  25 mg Oral BID WC   Chlorhexidine Gluconate Cloth  6 each Topical Q0600   diclofenac Sodium  2 g Topical QID   feeding supplement (NEPRO CARB STEADY)  237 mL Oral TID BM   heparin injection (subcutaneous)  5,000 Units Subcutaneous Q8H   hydrALAZINE  25 mg Oral Q8H   multivitamin  1 tablet Oral QHS   pantoprazole  40 mg Oral BID   polyethylene glycol  34 g Oral Daily   rosuvastatin  10 mg Oral Daily   senna  1 tablet Oral QHS   sertraline  100 mg Oral Daily   sodium bicarbonate  650 mg Oral TID   tamsulosin  0.4 mg Oral Daily   acetaminophen **OR** acetaminophen, hydrALAZINE, ondansetron **OR** ondansetron (ZOFRAN) IV, ondansetron (ZOFRAN) IV, sorbitol, milk of mag, mineral oil, glycerin (SMOG) enema  Assessment/ Plan:  61 y.o. male with a past medical history significant for dementia, hypertension, heart failure, CKD 3a and CVA admitted to [facility] on 12/22/2021 with intractable vomiting.  Also noted to have mild leukocytosis and AKI.   #1 AKI on CKD 3a with baseline creatinine of 1.4 and EGFR of 52.    It is felt that acute kidney injury could have been contributed from usage of Advil/NSAIDs and from dehydration secondary to intractable vomiting.   Patient creatinine remained high at around 8. Patient will require renal replacement therapy for now as patient creatinine since his last dialysis has increased steadily from 5. To -6.3 to 7.5 to around 7.7-- 8. Patient received 1 dialysis treatment on Saturday.  We will continue to monitor patient for renal recovery.  We continue to feel this is a temporary condition that will be corrected in time.  Patient will require outpatient dialysis placement at discharge.  Dialysis coordinator and case manager  currently attempting placement.  #2 recurrent vomiting likely multifactorial including advancing dementia ,medication side effect or functional dyspepsia.  GI is consulted.  EGD completed with no signs of active bleeding.  Did see gastritis, small hiatal hernia and LA grade B reflux esophagitis.  Concerning areas biopsied.  Resolved    3. Anemia of chronic kidney disease  Normocytic Lab Results  Component Value Date   HGB 8.7 (L) 01/07/2022  Hemoglobin remains steady from 8.5-8.7  4.   Hypertension, well controlled.  Currently receiving amlodipine, carvedilol, hydralazine, and tamsulosin.   BP currently 132/68   LOS: 16   3/6/20232:54 PM

## 2022-01-09 NOTE — Progress Notes (Signed)
Physical Therapy Treatment ?Patient Details ?Name: Randall [name] ?MRN: [id number] ?DOB: Nov 18, 1960 ?Today's Date: 01/09/2022 ? ? ?History of Present Illness Randall [name] is a 61 y.o. male seen for evaluation of recurrent vomiting at the request of Dr. Judd Gaudier. Patient has a PMH of dementia, hypertension, diabetes, chronic kidney disease, CHF.  Patient presented to the [facility] ED for chief complaint of nausea, vomiting and weakness. Upon presentation to the ED yesterday, vital signs were stable with blood pressure 120/72, heart rate 67, respirations 18, temperature 98.7, pulse oximetry 97%. Labs were significant for new AKI with Cr 3.5, normal electrolytes GFR 19, glucose 176, WBC 17.9, UA unremarkable.  Imaging studies revealed CT of the abdomen pelvis revealed a possible tiny hiatal hernia, constipation, otherwise no acute intra-abdominal or intrapelvic abnormality.  Aortic atherosclerosis.  He is being admitted for IV hydration.  GI is consulted for evaluation and management of recurrent vomiting. ? ?  ?PT Comments  ? ? Pt seen for PT tx with pt's sister Mechele Claude) present. Educated [place] on pt's mobility status during last PT session. On this date, pt received asleep in bed but awakened & able to sit EOB with assistance. Pt stands EOB with min assist & PT encourages him to attempt ambulation, even providing RW at one point, but pt continues to lean on counter & close eyes with pt reporting "I'm tired". Pt to lethargic to attempt more mobility at this time to assisted back to bed. Vitals checked: BP 126/65 mmHg (MAP 83) in RUE, SpO2 100% on room air, HR 71 bpm. Notified nurse of pt's lethargy. Will attempt to see pt at a later time when he's more alert & safe to mobilize. ?  ?Recommendations for follow up therapy are one component of a multi-disciplinary discharge planning process, led by the attending physician.  Recommendations may be updated based on patient status, additional functional criteria and  insurance authorization. ? ?Follow Up Recommendations ? Home health PT ?  ?  ?Assistance Recommended at Discharge Intermittent Supervision/Assistance  ?Patient can return home with the following A little help with walking and/or transfers;A little help with bathing/dressing/bathroom;Help with stairs or ramp for entrance;Assist for transportation;Direct supervision/assist for financial management;Direct supervision/assist for medications management;Assistance with cooking/housework ?  ?Equipment Recommendations ? Rolling walker (2 wheels)  ?  ?Recommendations for Other Services   ? ? ?  ?Precautions / Restrictions Precautions ?Precautions: Fall ?Restrictions ?Weight Bearing Restrictions: No  ?  ? ?Mobility ? Bed Mobility ?  ?Bed Mobility: Supine to Sit ?  ?  ?Supine to sit: Mod assist, HOB elevated (assistance to initiate & move BLE to EOB but then pt participates in uprighting trunk to sit EOB) ?Sit to supine: Supervision, HOB elevated ?  ?  ?  ? ?Transfers ?Overall transfer level: Needs assistance ?Equipment used: 1 person hand held assist ?Transfers: Sit to/from Stand ?Sit to Stand: Min assist ?  ?  ?  ?  ?  ?  ?  ? ?Ambulation/Gait ?  ?  ?  ?  ?  ?  ?  ?  ? ? ?Stairs ?  ?  ?  ?  ?  ? ? ?Wheelchair Mobility ?  ? ?Modified Rankin (Stroke Patients Only) ?  ? ? ?  ?Balance Overall balance assessment: Needs assistance ?Sitting-balance support: Feet supported ?Sitting balance-Leahy Scale: Fair ?  ?  ?Standing balance support: No upper extremity supported, During functional activity ?Standing balance-Leahy Scale: Poor ?  ?  ?  ?  ?  ?  ?  ?  ?  ?  ?  ?  ?  ? ?  ?  Cognition Arousal/Alertness: Lethargic ?Behavior During Therapy: Flat affect ?Overall Cognitive Status: History of cognitive impairments - at baseline ?  ?  ?  ?  ?  ?  ?  ?  ?  ?  ?  ?  ?  ?  ?  ?  ?  ?  ?  ? ?  ?Exercises   ? ?  ?General Comments   ?  ?  ? ?Pertinent Vitals/Pain Pain Assessment ?Pain Assessment: No/denies pain  ? ? ?Home Living   ?  ?  ?  ?  ?   ?  ?  ?  ?  ?   ?  ?Prior Function    ?  ?  ?   ? ?PT Goals (current goals can now be found in the care plan section) Acute Rehab PT Goals ?Patient Stated Goal: to return home ?PT Goal Formulation: With patient/family ?Time For Goal Achievement: 01/20/22 ?Potential to Achieve Goals: Fair ?Progress towards PT goals: PT to reassess next treatment ? ?  ?Frequency ? ? ? Min 2X/week ? ? ? ?  ?PT Plan Current plan remains appropriate  ? ? ?Co-evaluation   ?  ?  ?  ?  ? ?  ?AM-PAC PT "6 Clicks" Mobility   ?Outcome Measure ? Help needed turning from your back to your side while in a flat bed without using bedrails?: A Little ?Help needed moving from lying on your back to sitting on the side of a flat bed without using bedrails?: A Lot ?Help needed moving to and from a bed to a chair (including a wheelchair)?: A Little ?Help needed standing up from a chair using your arms (e.g., wheelchair or bedside chair)?: A Little ?Help needed to walk in hospital room?: A Little ?Help needed climbing 3-5 steps with a railing? : A Lot ?6 Click Score: 16 ? ?  ?End of Session Equipment Utilized During Treatment: Gait belt ?Activity Tolerance: Patient limited by lethargy ?Patient left: in bed;with call bell/phone within reach;with bed alarm set;with family/visitor present ?Nurse Communication:  (lethargy & vitals) ?PT Visit Diagnosis: Unsteadiness on feet (R26.81);Muscle weakness (generalized) (M62.81);Difficulty in walking, not elsewhere classified (R26.2) ?  ? ? ?Time: 3361-2244 ?PT Time Calculation (min) (ACUTE ONLY): 11 min ? ?Charges:  $Therapeutic Activity: 8-22 mins          ?          ? ?Lavone Nian, PT, DPT ?01/09/22, 12:52 PM ? ? ? ?Waunita Schooner ?01/09/2022, 12:48 PM ? ?

## 2022-01-09 NOTE — Progress Notes (Signed)
Mobility Specialist - Progress Note ? ? 01/09/22 1100  ?Mobility  ?Activity Ambulated with assistance in hallway  ?Level of Assistance Standby assist, set-up cues, supervision of patient - no hands on  ?Assistive Device Front wheel walker  ?Distance Ambulated (ft) 180 ft  ?Activity Response Tolerated well  ?$Mobility charge 1 Mobility  ? ? ? ?Pt ambulated in hallway with supervision. Mild lightheadedness after completing bed mobility, but resolved. No complaints. Pt left in bed with alarm set, needs in reach.  ? ? ?Kathee Delton ?Mobility Specialist ?01/09/22, 11:07 AM ? ? ? ? ?

## 2022-01-10 DIAGNOSIS — R1110 Vomiting, unspecified: Secondary | ICD-10-CM | POA: Diagnosis not present

## 2022-01-10 LAB — CBC
HCT: 25.7 % — ABNORMAL LOW (ref 39.0–52.0)
Hemoglobin: 8.5 g/dL — ABNORMAL LOW (ref 13.0–17.0)
MCH: 32.4 pg (ref 26.0–34.0)
MCHC: 33.1 g/dL (ref 30.0–36.0)
MCV: 98.1 fL (ref 80.0–100.0)
Platelets: 236 10*3/uL (ref 150–400)
RBC: 2.62 MIL/uL — ABNORMAL LOW (ref 4.22–5.81)
RDW: 12.4 % (ref 11.5–15.5)
WBC: 10.8 10*3/uL — ABNORMAL HIGH (ref 4.0–10.5)
nRBC: 0 % (ref 0.0–0.2)

## 2022-01-10 LAB — BASIC METABOLIC PANEL
Anion gap: 11 (ref 5–15)
BUN: 27 mg/dL — ABNORMAL HIGH (ref 6–20)
CO2: 29 mmol/L (ref 22–32)
Calcium: 9 mg/dL (ref 8.9–10.3)
Chloride: 98 mmol/L (ref 98–111)
Creatinine, Ser: 6.37 mg/dL — ABNORMAL HIGH (ref 0.61–1.24)
GFR, Estimated: 9 mL/min — ABNORMAL LOW (ref >60)
Glucose, Bld: 105 mg/dL — ABNORMAL HIGH (ref 70–99)
Potassium: 4.2 mmol/L (ref 3.5–5.1)
Sodium: 138 mmol/L (ref 135–145)

## 2022-01-10 NOTE — Progress Notes (Signed)
?  Progress Note ? ? ?Patient: Randall [name] HKV:[id number] DOB: 1960/12/02 DOA: 12/22/2021     17 ?DOS: the patient was seen and examined on 01/10/2022 ?  ?Brief hospital course: ?No notes on file ? ? ?Assessment and Plan: ?Recurrent vomiting- (present on admission) ?resolved. ?Recurrent episodes with second hospitalization in 2 weeks for the same ?No definite etiology identified. ?GI was consulted and patient underwent EGD on 12/26/2020 which shows esophagitis, some esophageal dysmotility, gastritis and few nonbleeding erosions in duodenum, biopsies were taken, which were negative. Vomiting and nausea resolved, tolerating diet ?-Continue with Protonix ? ?Anemia ?Hgb 8s from 11s on admission. No hematochezia or melena or other bleeding. Likely 2/2 new renal dysfunction ?- monitor  ? ?Hypokalemia ?Resolved w/ supplementation ? ?Essential hypertension- (present on admission) ?Blood pressure improved after adding hydralazine. ?-Hold Entresto as advised by nephrology while waiting for any renal function recovery. ?-Continue amlodipine  ?-Continue hydralazine, increase dose today ?-Continue to monitor ? ?Moderate major depression ([place]) ?- Continue Abilify and Zoloft ? ?Acute renal failure superimposed on stage 3a chronic kidney disease ([place]) ?Good urinary output though doesn't appear to be accurately recorded by nursing. ?Nephrology was consulted and they decided to proceed with hemodialysis.  Received 3 sessions of initial dialysis with temporary catheter which was then switched with permanent cath. GFR remains below 15 ?- next dialysis planned for 3/8, is getting dialyzed intermittently. Nephrology at work on outpatient chair ?- last dialysis was 3/4 ?- has right IJ tunneled dialysis catheter placed by vascular surgery on 2/24 ? ?History of CVA (cerebrovascular accident) ?Continue aspirin and statin ? ?Chronic diastolic CHF (congestive heart failure) ([place])- (present on admission) ?Currently euvolemic. Ef 50% on 2021 TTE  w/ grade 1 dysfunction ?-Continue Coreg, Carvedilol  ?-holding entresto as above ? ?Dementia ([place])- (present on admission) ?Continue Namenda and sertraline ? ?Constipation ?Large BM 3/5 ?- cont miralax/senna ? ?Subjective: Patient was seen and examined today.  No new complaint.  Tolerating diet, no pain.  ? ?Physical Exam: ?Vitals:  ? 01/09/22 1549 01/09/22 2054 01/10/22 0519 01/10/22 5643  ?BP: 124/71 (!) 157/69 (!) 148/68 (!) 142/63  ?Pulse: 67 66 71 76  ?Resp:  18 18 18   ?Temp:  98.6 ?F (37 ?C) 98.4 ?F (36.9 ?C) 99.4 ?F (37.4 ?C)  ?TempSrc:   Oral   ?SpO2:  100% 100% 99%  ?Weight:      ?Height:      ? ?General.  In no acute distress. ?Pulmonary.  Lungs clear bilaterally, normal respiratory effort. ?CV.  Regular rate and rhythm, no JVD, rub or murmur. ?Abdomen.  Soft, nontender, nondistended, BS positive. ?CNS.  Alert and oriented .  No focal neurologic deficit. ?Extremities.  No edema, no cyanosis, pulses intact and symmetrical. ?Psychiatry.  Judgment and insight appears normal. ? ?Data Reviewed: ?Prior notes and labs reviewed. ? ?Family Communication: family updated @ bedside 3/7 ? ?Disposition: ?Status is: Inpatient ?Remains inpatient appropriate because: unsafe d/c plan (no dialysis chair yet) ? ? Planned Discharge Destination: Home with Home Health (PT/OT following) ? ?DVT prophylaxis.  Subcu heparin ? ?Time spent: 20 minutes ? ? ?Author: ?Desma Maxim, MD ?01/10/2022 3:29 PM ? ?For on call review [email or web address].  ? ?

## 2022-01-10 NOTE — Progress Notes (Signed)
Physical Therapy Treatment ?Patient Details ?Name: Randall [name] ?MRN: [id number] ?DOB: [birth year] ?Today's Date: 01/10/2022 ? ? ?History of Present Illness Randall [name] is a 61 y.o. male seen for evaluation of recurrent vomiting at the request of Dr. Judd Gaudier. Patient has a PMH of dementia, hypertension, diabetes, chronic kidney disease, CHF.  Patient presented to the [facility] ED for chief complaint of nausea, vomiting and weakness. Upon presentation to the ED yesterday, vital signs were stable with blood pressure 120/72, heart rate 67, respirations 18, temperature 98.7, pulse oximetry 97%. Labs were significant for new AKI with Cr 3.5, normal electrolytes GFR 19, glucose 176, WBC 17.9, UA unremarkable.  Imaging studies revealed CT of the abdomen pelvis revealed a possible tiny hiatal hernia, constipation, otherwise no acute intra-abdominal or intrapelvic abnormality.  Aortic atherosclerosis.  He is being admitted for IV hydration.  GI is consulted for evaluation and management of recurrent vomiting. ? ?  ?PT Comments  ? ? Patient tolerated session well and was agreeable to treatment. Upon entry into room patient was laying in bed watching TV with family present. No pain reported throughout session. Patient was able to complete bed mobility at Mod I with HOB elevated. Sitting EOB patient completed therapeutic exercises (sit to stands, LAQs, and marches) for BLE strengthening. He was able to complete sit to stands with no physical assistance or AD at supervision. Patient was able to ambulate around the nurses station twice at [place] with no AD, and no LOB noted. Patient does continue to demonstrate some left inattention. Patient would continue to benefit from skilled physical therapy in order to optimize patient's safety and independence with activities of daily living. Continue to recommend HHPT upon discharge from acute hospitalization.  ?  ?Recommendations for follow up therapy are one component of a  multi-disciplinary discharge planning process, led by the attending physician.  Recommendations may be updated based on patient status, additional functional criteria and insurance authorization. ? ?Follow Up Recommendations ? Home health PT ?  ?  ?Assistance Recommended at Discharge Intermittent Supervision/Assistance  ?Patient can return home with the following A little help with walking and/or transfers;A little help with bathing/dressing/bathroom;Help with stairs or ramp for entrance;Assist for transportation;Direct supervision/assist for financial management;Direct supervision/assist for medications management;Assistance with cooking/housework ?  ?Equipment Recommendations ? Rolling walker (2 wheels)  ?  ?Recommendations for Other Services   ? ? ?  ?Precautions / Restrictions Precautions ?Precautions: Fall ?Restrictions ?Weight Bearing Restrictions: No  ?  ? ?Mobility ? Bed Mobility ?Overal bed mobility: Modified Independent ?Bed Mobility: Supine to Sit, Sit to Supine ?  ?  ?Supine to sit: Modified independent (Device/Increase time), HOB elevated ?Sit to supine: Modified independent (Device/Increase time), HOB elevated ?  ?General bed mobility comments: no physical assist needed ?  ? ?Transfers ?Overall transfer level: Needs assistance ?Equipment used: None ?Transfers: Sit to/from Stand ?Sit to Stand: Modified independent (Device/Increase time) ?  ?  ?  ?  ?  ?General transfer comment: x10 additional sit to stands for strengthening- no physical assistance required for these ?  ? ?Ambulation/Gait ?Ambulation/Gait assistance:  (SBA) ?Gait Distance (Feet): 300 Feet ?Assistive device: None ?Gait Pattern/deviations: Decreased step length - right, Decreased step length - left, Decreased stride length ?Gait velocity: decreased ?  ?  ?General Gait Details: L inattention ? ? ?Stairs ?  ?  ?  ?  ?  ? ? ?Wheelchair Mobility ?  ? ?Modified Rankin (Stroke Patients Only) ?  ? ? ?  ?Balance Overall  balance assessment: Needs  assistance ?Sitting-balance support: Feet supported ?Sitting balance-Leahy Scale: Good ?Sitting balance - Comments: good static and dynamic balance during ther activity ?  ?Standing balance support: No upper extremity supported, During functional activity ?Standing balance-Leahy Scale: Fair ?Standing balance comment: SBA for dynamic standing tasks without use of AD ?  ?  ?  ?  ?  ?  ?  ?  ?  ?  ?  ?  ? ?  ?Cognition Arousal/Alertness: Lethargic ?Behavior During Therapy: Flat affect ?Overall Cognitive Status: History of cognitive impairments - at baseline ?Area of Impairment: Following commands, Safety/judgement, Problem solving ?  ?  ?  ?  ?  ?  ?  ?  ?  ?  ?  ?Following Commands: Follows one step commands consistently ?Safety/Judgement: Decreased awareness of safety ?  ?Problem Solving: Slow processing, Decreased initiation, Requires verbal cues ?General Comments: Pt with slow processing and cuing for sequencing and initiation ?  ?  ? ?  ?Exercises Other Exercises ?Other Exercises: LAQ x20 bilaterally from EOBwth visual target from therapists hand for increased range ?Other Exercises: Seated marches x20 bilaterally from EOB wth visual target from therapists hand for increased range, cueing for upright posture throughout duration of task ? ?  ?General Comments   ?  ?  ? ?Pertinent Vitals/Pain Pain Assessment ?Pain Assessment: No/denies pain ?Pain Intervention(s): Monitored during session  ? ? ?Home Living   ?  ?  ?  ?  ?  ?  ?  ?  ?  ?   ?  ?Prior Function    ?  ?  ?   ? ?PT Goals (current goals can now be found in the care plan section) Acute Rehab PT Goals ?Patient Stated Goal: to return home ?PT Goal Formulation: With patient/family ?Time For Goal Achievement: 01/20/22 ?Potential to Achieve Goals: Fair ?Progress towards PT goals: Progressing toward goals ? ?  ?Frequency ? ? ? Min 2X/week ? ? ? ?  ?PT Plan Current plan remains appropriate  ? ? ?Co-evaluation   ?  ?  ?  ?  ? ?  ?AM-PAC PT "6 Clicks" Mobility    ?Outcome Measure ? Help needed turning from your back to your side while in a flat bed without using bedrails?: A Little ?Help needed moving from lying on your back to sitting on the side of a flat bed without using bedrails?: A Little ?Help needed moving to and from a bed to a chair (including a wheelchair)?: A Little ?Help needed standing up from a chair using your arms (e.g., wheelchair or bedside chair)?: A Little ?Help needed to walk in hospital room?: A Little ?Help needed climbing 3-5 steps with a railing? : A Lot ?6 Click Score: 17 ? ?  ?End of Session Equipment Utilized During Treatment: Gait belt ?Activity Tolerance: Patient limited by lethargy ?Patient left: in bed;with call bell/phone within reach;with family/visitor present ?Nurse Communication: Mobility status ?PT Visit Diagnosis: Unsteadiness on feet (R26.81);Muscle weakness (generalized) (M62.81);Difficulty in walking, not elsewhere classified (R26.2) ?  ? ? ?Time: 2878-6767 ?PT Time Calculation (min) (ACUTE ONLY): 17 min ? ?Charges:  $Gait Training: 8-22 mins          ?          ? ?Iva Boop, PT  ?01/10/22. 4:10 PM ? ?

## 2022-01-10 NOTE — Progress Notes (Signed)
[facility]  ROUNDING NOTE   Subjective:  Pt is admitted to [facility] on 12/22/2021 with intractable vomiting on 12/22/2021 with intractable vomiting which has been going for weeks.  He noted to have mild leukocytosis and AKI on admission patient has a past medical history significant for dementia, hypertension, HFpEF, CKD 3a and CVA.  We were asked to see the patient for AKI with a significantly elevated BUN and creatinine  Patient resting in bed, drowsy related Sister at bedside Tolerating small meals but appetite remains poor Ensure offered between meals, patient receptive Denies shortness of breath Ambulating and sitting in chair with mobility tech and therapy  Creatinine 6.37  Objective:  Vital signs in last 24 hours:  Temp:  [98.4 F (36.9 C)-99.4 F (37.4 C)] 99.4 F (37.4 C) (03/07 0808) Pulse Rate:  [66-76] 76 (03/07 0808) Resp:  [18] 18 (03/07 0808) BP: (124-157)/(63-71) 142/63 (03/07 0808) SpO2:  [99 %-100 %] 99 % (03/07 0808)  Weight change:  Filed Weights   12/30/21 2000 01/07/22 1516 01/07/22 1808  Weight: 77.2 kg 75.1 kg 74.6 kg    Intake/Output: I/O last 3 completed shifts: In: 420 [P.O.:420] Out: 600 [Urine:600]   Intake/Output this shift:  No intake/output data recorded.  Physical Exam: General: NAD  Head: Normocephalic, atraumatic. Moist oral mucosal membranes  Eyes: Anicteric  Lungs:  Clear to auscultation, normal effort  Heart: S1S2 no rubs  Abdomen:  Soft, nontender, bowel sounds present  Extremities:  No peripheral edema.  Neurologic: Alert following simple commands  Skin: No acute rash  Access: Rt IJ permcath placed by Dr Lucky Cowboy on 84/66/59    Basic Metabolic Panel: Recent Labs  Lab 01/04/22 0451 01/05/22 0420 01/06/22 0450 01/07/22 0449 01/08/22 0552 01/09/22 0544 01/10/22 1022  NA 137 139 140 142 140 140 138  K 3.3* 3.7 3.8 3.7 3.5 3.5 4.2  CL 99 100 101 102 99 100 98  CO2 _0 GLUCOSE 101* 101* 101* 92  83 85 105*  BUN 33* 35* 36* 35* 20 23* 27*  CREATININE 7.98* 8.00* 7.90* 7.74* 5.00* 5.76* 6.37*  CALCIUM 8.1* 8.4* 8.5* 8.4* 8.4* 8.4* 9.0  MG 1.9 2.0 2.0  --   --   --   --      Liver Function Tests: No results for input(s): AST, ALT, ALKPHOS, BILITOT, PROT, ALBUMIN in the last 168 hours.  No results for input(s): LIPASE, AMYLASE in the last 168 hours.  No results for input(s): AMMONIA in the last 168 hours.  CBC: Recent Labs  Lab 01/04/22 0451 01/05/22 0420 01/07/22 0449 01/10/22 0450  WBC 13.5* 12.2* 8.0 10.8*  HGB 8.5* 8.8* 8.7* 8.5*  HCT 25.3* 26.6* 26.3* 25.7*  MCV 96.6 96.4 97.0 98.1  PLT 191 210 240 236     Cardiac Enzymes: No results for input(s): CKTOTAL, CKMB, CKMBINDEX, TROPONINI in the last 168 hours.  BNP: Invalid input(s): POCBNP  CBG: No results for input(s): GLUCAP in the last 168 hours.   Microbiology: Results for orders placed or performed during the hospital encounter of 12/22/21  Resp Panel by RT-PCR (Flu A&B, Covid) Nasopharyngeal Swab     Status: None   Collection Time: 12/22/21 11:18 PM   Specimen: Nasopharyngeal Swab; Nasopharyngeal(NP) swabs in vial transport medium  Result Value Ref Range Status   SARS Coronavirus 2 by RT PCR NEGATIVE NEGATIVE Final    Comment: (NOTE) SARS-CoV-2 target nucleic acids are NOT DETECTED.  The SARS-CoV-2 RNA is  generally detectable in upper respiratory specimens during the acute phase of infection. The lowest concentration of SARS-CoV-2 viral copies this assay can detect is 138 copies/mL. A negative result does not preclude SARS-Cov-2 infection and should not be used as the sole basis for treatment or other patient management decisions. A negative result may occur with  improper specimen collection/handling, submission of specimen other than nasopharyngeal swab, presence of viral mutation(s) within the areas targeted by this assay, and inadequate number of viral copies(<138 copies/mL). A negative  result must be combined with clinical observations, patient history, and epidemiological information. The expected result is Negative.  Fact Sheet for Patients:  EntrepreneurPulse.com.au  Fact Sheet for Healthcare Providers:  IncredibleEmployment.be  This test is no t yet approved or cleared by the [place] FDA and  has been authorized for detection and/or diagnosis of SARS-CoV-2 by FDA under an Emergency Use Authorization (EUA). This EUA will remain  in effect (meaning this test can be used) for the duration of the COVID-19 declaration under Section 564(b)(1) of the Act, 21 U.S.C.section 360bbb-3(b)(1), unless the authorization is terminated  or revoked sooner.       Influenza A by PCR NEGATIVE NEGATIVE Final   Influenza B by PCR NEGATIVE NEGATIVE Final    Comment: (NOTE) The Xpert Xpress SARS-CoV-2/FLU/RSV plus assay is intended as an aid in the diagnosis of influenza from Nasopharyngeal swab specimens and should not be used as a sole basis for treatment. Nasal washings and aspirates are unacceptable for Xpert Xpress SARS-CoV-2/FLU/RSV testing.  Fact Sheet for Patients: EntrepreneurPulse.com.au  Fact Sheet for Healthcare Providers: IncredibleEmployment.be  This test is not yet approved or cleared by the [place] FDA and has been authorized for detection and/or diagnosis of SARS-CoV-2 by FDA under an Emergency Use Authorization (EUA). This EUA will remain in effect (meaning this test can be used) for the duration of the COVID-19 declaration under Section 564(b)(1) of the Act, 21 U.S.C. section 360bbb-3(b)(1), unless the authorization is terminated or revoked.  Performed at [facility], [address] [place], [place] [address]     Coagulation Studies: No results for input(s): LABPROT, INR in the last 72 hours.  Urinalysis: No results for input(s): COLORURINE, LABSPEC,  PHURINE, GLUCOSEU, HGBUR, BILIRUBINUR, KETONESUR, PROTEINUR, UROBILINOGEN, NITRITE, LEUKOCYTESUR in the last 72 hours.  Invalid input(s): APPERANCEUR     Imaging: No results found.   Medications:      amLODipine  10 mg Oral Daily   ARIPiprazole  1 mg Oral Daily   aspirin EC  81 mg Oral Daily   carvedilol  25 mg Oral BID WC   Chlorhexidine Gluconate Cloth  6 each Topical Q0600   diclofenac Sodium  2 g Topical QID   feeding supplement (NEPRO CARB STEADY)  237 mL Oral TID BM   heparin injection (subcutaneous)  5,000 Units Subcutaneous Q8H   hydrALAZINE  50 mg Oral Q8H   multivitamin  1 tablet Oral QHS   pantoprazole  40 mg Oral BID   polyethylene glycol  34 g Oral Daily   rosuvastatin  10 mg Oral Daily   senna  1 tablet Oral QHS   sertraline  100 mg Oral Daily   sodium bicarbonate  650 mg Oral TID   tamsulosin  0.4 mg Oral Daily   acetaminophen **OR** acetaminophen, hydrALAZINE, ondansetron **OR** ondansetron (ZOFRAN) IV, ondansetron (ZOFRAN) IV, sorbitol, milk of mag, mineral oil, glycerin (SMOG) enema  Assessment/ Plan:  61 y.o. male with a past medical history significant for  dementia, hypertension, heart failure, CKD 3a and CVA admitted to [facility] on 12/22/2021 with intractable vomiting.  Also noted to have mild leukocytosis and AKI.   #1 AKI on CKD 3a with baseline creatinine of 1.4 and EGFR of 52.    It is felt that acute kidney injury could have been contributed from usage of Advil/NSAIDs and from dehydration secondary to intractable vomiting.   Patient creatinine remained high at around 8. Patient will require renal replacement therapy for now as patient creatinine since his last dialysis has increased steadily from 5. To -6.3 to 7.5 to around 7.7-- 8. Creatinine continues to increase with minimal urine output recorded.  We feel patient is urinating however occurrences are not documented by staff.  Patient encouraged to use urinal for accurate calculations.  Due to  increasing creatinine, we will plan to dialyze patient tomorrow.  Patient may require dialysis twice weekly at discharge while renal recovery continues.  Dialysis coordinator and case manager currently working to have dialysis clinic approved by insurance.  #2 recurrent vomiting likely multifactorial including advancing dementia ,medication side effect or functional dyspepsia.  GI is consulted.  EGD completed with no signs of active bleeding.  Did see gastritis, small hiatal hernia and LA grade B reflux esophagitis.  Concerning areas biopsied.  Resolved    3. Anemia of chronic kidney disease  Normocytic Lab Results  Component Value Date   HGB 8.5 (L) 01/10/2022  Hemoglobin remains steady from 8.5-8.7  4.   Hypertension, well controlled.  Currently receiving amlodipine, carvedilol, hydralazine, and tamsulosin.   BP stable   LOS: 17   3/7/20232:02 PM

## 2022-01-11 DIAGNOSIS — R1110 Vomiting, unspecified: Secondary | ICD-10-CM | POA: Diagnosis not present

## 2022-01-11 LAB — BASIC METABOLIC PANEL
Anion gap: 9 (ref 5–15)
BUN: 28 mg/dL — ABNORMAL HIGH (ref 6–20)
CO2: 28 mmol/L (ref 22–32)
Calcium: 8.8 mg/dL — ABNORMAL LOW (ref 8.9–10.3)
Chloride: 100 mmol/L (ref 98–111)
Creatinine, Ser: 6.27 mg/dL — ABNORMAL HIGH (ref 0.61–1.24)
GFR, Estimated: 10 mL/min — ABNORMAL LOW (ref >60)
Glucose, Bld: 113 mg/dL — ABNORMAL HIGH (ref 70–99)
Potassium: 3.8 mmol/L (ref 3.5–5.1)
Sodium: 137 mmol/L (ref 135–145)

## 2022-01-11 MED ORDER — AMLODIPINE BESYLATE 10 MG PO TABS
10.0000 mg | ORAL_TABLET | Freq: Every evening | ORAL | Status: DC
  Administered 2022-01-11: 10 mg via ORAL
  Filled 2022-01-11: qty 1

## 2022-01-11 MED ORDER — HEPARIN SODIUM (PORCINE) 1000 UNIT/ML IJ SOLN
INTRAMUSCULAR | Status: AC
  Administered 2022-01-11: 4200 [IU]
  Filled 2022-01-11: qty 10

## 2022-01-11 NOTE — Progress Notes (Signed)
[facility]  ROUNDING NOTE   Subjective:  Pt is admitted to [facility] on 12/22/2021 with intractable vomiting on 12/22/2021 with intractable vomiting which has been going for weeks.  He noted to have mild leukocytosis and AKI on admission patient has a past medical history significant for dementia, hypertension, HFpEF, CKD 3a and CVA.  We were asked to see the patient for AKI with a significantly elevated BUN and creatinine  Patient seen and evaluated during dialysis   HEMODIALYSIS FLOWSHEET:  Blood Flow Rate (mL/min): 400 mL/min Arterial Pressure (mmHg): -190 mmHg Venous Pressure (mmHg): 120 mmHg Transmembrane Pressure (mmHg): 60 mmHg Ultrafiltration Rate (mL/min): 170 mL/min Dialysate Flow Rate (mL/min): 500 ml/min Conductivity: Machine : 13.6 Conductivity: Machine : 13.6 Dialysis Fluid Bolus: Normal Saline Bolus Amount (mL): 250 mL  Continues to report poor appetite  Objective:  Vital signs in last 24 hours:  Temp:  [98.5 F (36.9 C)-98.8 F (37.1 C)] 98.8 F (37.1 C) (03/08 0848) Pulse Rate:  [68-93] 90 (03/08 1155) Resp:  [12-21] 13 (03/08 1200) BP: (109-185)/(51-96) 153/80 (03/08 1200) SpO2:  [98 %-100 %] 100 % (03/08 1155) Weight:  [70.8 kg-71.4 kg] 70.8 kg (03/08 1155)  Weight change:  Filed Weights   01/07/22 1808 01/11/22 0848 01/11/22 1155  Weight: 74.6 kg 71.4 kg 70.8 kg    Intake/Output: I/O last 3 completed shifts: In: 380 [P.O.:380] Out: 0    Intake/Output this shift:  Total I/O In: -  Out: -1   Physical Exam: General: NAD  Head: Normocephalic, atraumatic. Moist oral mucosal membranes  Eyes: Anicteric  Lungs:  Clear to auscultation, normal effort  Heart: S1S2 no rubs  Abdomen:  Soft, nontender, bowel sounds present  Extremities:  No peripheral edema.  Neurologic: Alert following simple commands  Skin: No acute rash  Access: Rt IJ permcath placed by Dr Lucky Cowboy on 05/01/93    Basic Metabolic Panel: Recent Labs  Lab 01/05/22 0420  01/06/22 0450 01/07/22 0449 01/08/22 0552 01/09/22 0544 01/10/22 1022 01/11/22 0534  NA 139 140 142 140 140 138 137  K 3.7 3.8 3.7 3.5 3.5 4.2 3.8  CL 100 101 102 99 100 98 100  CO2 _0 GLUCOSE 101* 101* 92 83 85 105* 113*  BUN 35* 36* 35* 20 23* 27* 28*  CREATININE 8.00* 7.90* 7.74* 5.00* 5.76* 6.37* 6.27*  CALCIUM 8.4* 8.5* 8.4* 8.4* 8.4* 9.0 8.8*  MG 2.0 2.0  --   --   --   --   --      Liver Function Tests: No results for input(s): AST, ALT, ALKPHOS, BILITOT, PROT, ALBUMIN in the last 168 hours.  No results for input(s): LIPASE, AMYLASE in the last 168 hours.  No results for input(s): AMMONIA in the last 168 hours.  CBC: Recent Labs  Lab 01/05/22 0420 01/07/22 0449 01/10/22 0450  WBC 12.2* 8.0 10.8*  HGB 8.8* 8.7* 8.5*  HCT 26.6* 26.3* 25.7*  MCV 96.4 97.0 98.1  PLT 210 240 236     Cardiac Enzymes: No results for input(s): CKTOTAL, CKMB, CKMBINDEX, TROPONINI in the last 168 hours.  BNP: Invalid input(s): POCBNP  CBG: No results for input(s): GLUCAP in the last 168 hours.   Microbiology: Results for orders placed or performed during the hospital encounter of 12/22/21  Resp Panel by RT-PCR (Flu A&B, Covid) Nasopharyngeal Swab     Status: None   Collection Time: 12/22/21 11:18 PM   Specimen: Nasopharyngeal Swab; Nasopharyngeal(NP) swabs in  vial transport medium  Result Value Ref Range Status   SARS Coronavirus 2 by RT PCR NEGATIVE NEGATIVE Final    Comment: (NOTE) SARS-CoV-2 target nucleic acids are NOT DETECTED.  The SARS-CoV-2 RNA is generally detectable in upper respiratory specimens during the acute phase of infection. The lowest concentration of SARS-CoV-2 viral copies this assay can detect is 138 copies/mL. A negative result does not preclude SARS-Cov-2 infection and should not be used as the sole basis for treatment or other patient management decisions. A negative result may occur with  improper specimen collection/handling,  submission of specimen other than nasopharyngeal swab, presence of viral mutation(s) within the areas targeted by this assay, and inadequate number of viral copies(<138 copies/mL). A negative result must be combined with clinical observations, patient history, and epidemiological information. The expected result is Negative.  Fact Sheet for Patients:  EntrepreneurPulse.com.au  Fact Sheet for Healthcare Providers:  IncredibleEmployment.be  This test is no t yet approved or cleared by the [place] FDA and  has been authorized for detection and/or diagnosis of SARS-CoV-2 by FDA under an Emergency Use Authorization (EUA). This EUA will remain  in effect (meaning this test can be used) for the duration of the COVID-19 declaration under Section 564(b)(1) of the Act, 21 U.S.C.section 360bbb-3(b)(1), unless the authorization is terminated  or revoked sooner.       Influenza A by PCR NEGATIVE NEGATIVE Final   Influenza B by PCR NEGATIVE NEGATIVE Final    Comment: (NOTE) The Xpert Xpress SARS-CoV-2/FLU/RSV plus assay is intended as an aid in the diagnosis of influenza from Nasopharyngeal swab specimens and should not be used as a sole basis for treatment. Nasal washings and aspirates are unacceptable for Xpert Xpress SARS-CoV-2/FLU/RSV testing.  Fact Sheet for Patients: EntrepreneurPulse.com.au  Fact Sheet for Healthcare Providers: IncredibleEmployment.be  This test is not yet approved or cleared by the [place] FDA and has been authorized for detection and/or diagnosis of SARS-CoV-2 by FDA under an Emergency Use Authorization (EUA). This EUA will remain in effect (meaning this test can be used) for the duration of the COVID-19 declaration under Section 564(b)(1) of the Act, 21 U.S.C. section 360bbb-3(b)(1), unless the authorization is terminated or revoked.  Performed at [facility], [address] [place], [place] [address]     Coagulation Studies: No results for input(s): LABPROT, INR in the last 72 hours.  Urinalysis: No results for input(s): COLORURINE, LABSPEC, PHURINE, GLUCOSEU, HGBUR, BILIRUBINUR, KETONESUR, PROTEINUR, UROBILINOGEN, NITRITE, LEUKOCYTESUR in the last 72 hours.  Invalid input(s): APPERANCEUR     Imaging: No results found.   Medications:      amLODipine  10 mg Oral QPM   ARIPiprazole  1 mg Oral Daily   aspirin EC  81 mg Oral Daily   carvedilol  25 mg Oral BID WC   Chlorhexidine Gluconate Cloth  6 each Topical Q0600   diclofenac Sodium  2 g Topical QID   feeding supplement (NEPRO CARB STEADY)  237 mL Oral TID BM   heparin injection (subcutaneous)  5,000 Units Subcutaneous Q8H   heparin sodium (porcine)       hydrALAZINE  50 mg Oral Q8H   multivitamin  1 tablet Oral QHS   pantoprazole  40 mg Oral BID   polyethylene glycol  34 g Oral Daily   rosuvastatin  10 mg Oral Daily   senna  1 tablet Oral QHS   sertraline  100 mg Oral Daily   sodium bicarbonate  650 mg Oral  TID   tamsulosin  0.4 mg Oral Daily   acetaminophen **OR** acetaminophen, hydrALAZINE, ondansetron **OR** ondansetron (ZOFRAN) IV, ondansetron (ZOFRAN) IV, sorbitol, milk of mag, mineral oil, glycerin (SMOG) enema  Assessment/ Plan:  61 y.o. male with a past medical history significant for dementia, hypertension, heart failure, CKD 3a and CVA admitted to [facility] on 12/22/2021 with intractable vomiting.  Also noted to have mild leukocytosis and AKI.   #1 AKI on CKD 3a with baseline creatinine of 1.4 and EGFR of 52.    It is felt that acute kidney injury could have been contributed from usage of Advil/NSAIDs and from dehydration secondary to intractable vomiting.   Patient creatinine remained high at around 8. Patient will require renal replacement therapy for now as patient creatinine since his last dialysis has increased steadily from 5. To -6.3 to 7.5 to around 7.7--  8. Creatinine remains stable with no urine output recorded.  Plan to dialyze patient today with no UF.  Patient may require dialysis twice weekly at discharge while renal recovery continues.  Dialysis coordinator and case manager currently working to have dialysis clinic approved by insurance.  #2 recurrent vomiting likely multifactorial including advancing dementia ,medication side effect or functional dyspepsia.  GI is consulted.  EGD completed with no signs of active bleeding.  Did see gastritis, small hiatal hernia and LA grade B reflux esophagitis.  Concerning areas biopsied.  Resolved    3. Anemia of chronic kidney disease  Normocytic Lab Results  Component Value Date   HGB 8.5 (L) 01/10/2022  Hemoglobin remains steady from 8.5-8.7  4.   Hypertension, well controlled.  Currently receiving amlodipine, carvedilol, hydralazine, and tamsulosin.   BP stable   LOS: 18   3/8/202312:22 PM

## 2022-01-11 NOTE — Progress Notes (Signed)
Hemodialysis notes ? ?HD completed. Tolerated well. No complications. Pt asymptomatic. Total treatment time: 2 hours. No fluid removal. ?

## 2022-01-11 NOTE — Plan of Care (Signed)
?  Problem: Clinical Measurements: ?Goal: Ability to maintain clinical measurements within normal limits will improve ?Outcome: Progressing ?Goal: Will remain free from infection ?Outcome: Progressing ?Goal: Diagnostic test results will improve ?Outcome: Progressing ?Goal: Respiratory complications will improve ?Outcome: Progressing ?Goal: Cardiovascular complication will be avoided ?Outcome: Progressing ?  ?Problem: Activity: ?Goal: Risk for activity intolerance will decrease ?Outcome: Progressing ?  ?Problem: Nutrition: ?Goal: Adequate nutrition will be maintained ?Outcome: Progressing ?  ?Problem: Coping: ?Goal: Level of anxiety will decrease ?Outcome: Progressing ?  ?Problem: Elimination: ?Goal: Will not experience complications related to bowel motility ?Outcome: Progressing ?Goal: Will not experience complications related to urinary retention ?Outcome: Progressing ?  ?Problem: Pain Managment: ?Goal: General experience of comfort will improve ?Outcome: Progressing ?  ?Problem: Safety: ?Goal: Ability to remain free from injury will improve ?Outcome: Progressing ?  ?Problem: Skin Integrity: ?Goal: Risk for impaired skin integrity will decrease ?Outcome: Progressing ?  ?Problem: Health Behavior/Discharge Planning: ?Goal: Ability to manage health-related needs will improve ?Outcome: Progressing ?  ?Problem: Education: ?Goal: Knowledge of disease and its progression will improve ?Outcome: Progressing ?  ?Problem: Clinical Measurements: ?Goal: Complications related to the disease process or treatment will be avoided or minimized ?Outcome: Progressing ?Goal: Dialysis access will remain free of complications ?Outcome: Progressing ?  ?Problem: Fluid Volume: ?Goal: Fluid volume balance will be maintained or improved ?Outcome: Progressing ?  ?Problem: Activity: ?Goal: Activity intolerance will improve ?Outcome: Progressing ?  ?Problem: Nutritional: ?Goal: Ability to make appropriate dietary choices will improve ?Outcome:  Progressing ?  ?Problem: Respiratory: ?Goal: Respiratory symptoms related to disease process will be avoided ?Outcome: Progressing ?  ?Problem: Self-Concept: ?Goal: Body image disturbance will be avoided or minimized ?Outcome: Progressing ?  ?Problem: Urinary Elimination: ?Goal: Progression of disease will be identified and treated ?Outcome: Progressing ?  ?

## 2022-01-11 NOTE — Progress Notes (Signed)
Mobility Specialist - Progress Note ? ? ? 01/11/22 1559  ?Mobility  ?Activity Refused mobility  ? ? ?Pt supine upon arrival using RA. Refused session d/t finishing lunch. Will attempt at another date and time. ? ?Merrily Brittle ?Mobility Specialist ?01/11/22, 4:00 PM ? ? ? ? ?

## 2022-01-11 NOTE — Progress Notes (Signed)
Mobility Specialist - Progress Note ? ? 01/11/22 1500  ?Mobility  ?Activity Refused mobility  ? ? ? ?Pt declined mobility, no reason specified. Pt recently returned from HD tx and request to hold until after he is able to eat lunch. Will attempt another date/time.  ? ? ?Kathee Delton ?Mobility Specialist ?01/11/22, 4:00 PM ? ? ? ? ?

## 2022-01-11 NOTE — Progress Notes (Signed)
?PROGRESS NOTE ? ? ? ?Randall [name]  OHY:[id number] DOB: [birth year] DOA: 12/22/2021 ?PCP: Marlowe Aschoff, MD  ? ? ?Brief Narrative:  ?Patient initially admitted to [facility] on 2/16 with chief complaint of intractable nausea and vomiting progressing over the past several weeks.  Patient had significant AKI on admission.  History of HFpEF, CKD stage IIIa and CVA.  Nephrology involved.  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  Started on intermittent hemodialysis. ? ?Current.  Patient is medically stable however we are lacking a safe disposition plan.  Outpatient dialysis coordinator working on setting up HD chair time. ? ? ?Assessment & Plan: ?  ?Principal Problem: ?  Recurrent vomiting ?Active Problems: ?  Leukocytosis ?  Dementia ([place]) ?  Chronic systolic CHF (congestive heart failure) ([place]) ?  History of CVA (cerebrovascular accident) ?  Acute renal failure superimposed on stage 3a chronic kidney disease ([place]) ?  Moderate major depression ([place]) ?  Intractable nausea and vomiting ?  Essential hypertension ? ?Acute renal failure superimposed on stage 3a chronic kidney disease ([place]) ?Good urinary output though doesn't appear to be accurately recorded by nursing. ?Nephrology was consulted and they decided to proceed with hemodialysis.  Received 3 sessions of initial dialysis with temporary catheter which was then switched with permanent cath. GFR remains below 15 ?- next dialysis planned for 3/8, is getting dialyzed intermittently. Nephrology at work on outpatient chair ?- last dialysis was 3/8 ?- has right IJ tunneled dialysis catheter placed by vascular surgery on 2/24 ?Plan: ?Nephrology following for inpatient hemodialysis needs ?Discharge delayed by lack of safe disposition plan as the patient's insurance does not cover outpatient hemodialysis ? ?Recurrent vomiting- (present on admission) ?resolved. ?Recurrent episodes with second hospitalization in 2 weeks for the same ?No definite etiology  identified. ?GI was consulted and patient underwent EGD on 12/26/2020 which shows esophagitis, some esophageal dysmotility, gastritis and few nonbleeding erosions in duodenum, biopsies were taken, which were negative. Vomiting and nausea resolved, tolerating diet ?-Continue with Protonix ?-Okay for diet as tolerated ?  ?Anemia ?Hgb 8s from 11s on admission.  ?No hematochezia or melena or other bleeding.  ?Likely 2/2 new renal dysfunction ?- monitor  ?-Currently no indication for transfusion ?  ?Hypokalemia ?Resolved w/ supplementation ?  ?Essential hypertension- (present on admission) ?Blood pressure improved after adding hydralazine. ?-Hold Entresto as advised by nephrology while waiting for any renal function recovery. ?-Continue amlodipine  ?-Continue hydralazine, increase dose today ?-Continue to monitor ?  ?Moderate major depression ([place]) ?- Continue Abilify and Zoloft ?  ? ?History of CVA (cerebrovascular accident) ?Continue aspirin and statin ?  ?Chronic diastolic CHF (congestive heart failure) ([place])- (present on admission) ?Currently euvolemic. Ef 50% on 2021 TTE w/ grade 1 dysfunction ?-Continue Coreg, Carvedilol  ?-holding entresto as above ?  ?Dementia ([place])- (present on admission) ?Continue Namenda and sertraline ?  ?Constipation ?Large BM 3/5 ?- cont miralax/senna ? ? ?DVT prophylaxis: SQ heparin ?Code Status: Full ?Family Communication: Son at bedside 3/8 ?Disposition Plan: Status is: Inpatient ?Remains inpatient appropriate because: Unsafe discharge plan ? ? ?Level of care: Med-Surg ? ?Consultants:  ?Nephrology ? ?Procedures:  ?Tunneled dialysis catheter placement ? ?Antimicrobials: ?None ? ? ?Subjective: ?Seen and examined.  Resting in bed.  Appears fatigued.  No pain complaints.  Son at bedside.  Patient endorses hunger ? ?Objective: ?Vitals:  ? 01/11/22 1130 01/11/22 1145 01/11/22 1155 01/11/22 1200  ?BP: 113/75 (!) 168/74 (!) 153/80 (!) 153/80  ?Pulse: 93 93 90   ?Resp: 13  13 17 13   ?Temp:       ?TempSrc:      ?SpO2: 100% 100% 100%   ?Weight:   70.8 kg   ?Height:      ? ? ?Intake/Output Summary (Last 24 hours) at 01/11/2022 1535 ?Last data filed at 01/11/2022 1155 ?Gross per 24 hour  ?Intake 0 ml  ?Output -1 ml  ?Net 1 ml  ? ?Filed Weights  ? 01/07/22 1808 01/11/22 0848 01/11/22 1155  ?Weight: 74.6 kg 71.4 kg 70.8 kg  ? ? ?Examination: ? ?General exam: No acute distress.  Appears fatigued ?Respiratory system: Clear to auscultation. Respiratory effort normal. ?Cardiovascular system: S1-S2, RRR, no murmurs, no pedal edema ?Gastrointestinal system: Soft, NT/ND, normal bowel sounds ?Central nervous system: Alert and oriented. No focal neurological deficits. ?Extremities: Symmetric 5 x 5 power. ?Skin: No rashes, lesions or ulcers ?Psychiatry: Judgement and insight appear normal. Mood & affect appropriate.  ? ? ? ?Data Reviewed: I have personally reviewed following labs and imaging studies ? ?CBC: ?Recent Labs  ?Lab 01/05/22 ?0420 01/07/22 ?7614 01/10/22 ?0450  ?WBC 12.2* 8.0 10.8*  ?HGB 8.8* 8.7* 8.5*  ?HCT 26.6* 26.3* 25.7*  ?MCV 96.4 97.0 98.1  ?PLT 210 240 236  ? ?Basic Metabolic Panel: ?Recent Labs  ?Lab 01/05/22 ?0420 01/06/22 ?0450 01/07/22 ?7092 01/08/22 ?9574 01/09/22 ?7340 01/10/22 ?1022 01/11/22 ?3709  ?NA 139 140 142 140 140 138 137  ?K 3.7 3.8 3.7 3.5 3.5 4.2 3.8  ?CL 100 101 102 99 100 98 100  ?CO2 28 30 30 30 31 29 28   ?GLUCOSE 101* 101* 92 83 85 105* 113*  ?BUN 35* 36* 35* 20 23* 27* 28*  ?CREATININE 8.00* 7.90* 7.74* 5.00* 5.76* 6.37* 6.27*  ?CALCIUM 8.4* 8.5* 8.4* 8.4* 8.4* 9.0 8.8*  ?MG 2.0 2.0  --   --   --   --   --   ? ?GFR: ?Estimated Creatinine Clearance: 12.5 mL/min (A) (by C-G formula based on SCr of 6.27 mg/dL (H)). ?Liver Function Tests: ?No results for input(s): AST, ALT, ALKPHOS, BILITOT, PROT, ALBUMIN in the last 168 hours. ?No results for input(s): LIPASE, AMYLASE in the last 168 hours. ?No results for input(s): AMMONIA in the last 168 hours. ?Coagulation Profile: ?No results for  input(s): INR, PROTIME in the last 168 hours. ?Cardiac Enzymes: ?No results for input(s): CKTOTAL, CKMB, CKMBINDEX, TROPONINI in the last 168 hours. ?BNP (last 3 results) ?No results for input(s): PROBNP in the last 8760 hours. ?HbA1C: ?No results for input(s): HGBA1C in the last 72 hours. ?CBG: ?No results for input(s): GLUCAP in the last 168 hours. ?Lipid Profile: ?No results for input(s): CHOL, HDL, LDLCALC, TRIG, CHOLHDL, LDLDIRECT in the last 72 hours. ?Thyroid Function Tests: ?No results for input(s): TSH, T4TOTAL, FREET4, T3FREE, THYROIDAB in the last 72 hours. ?Anemia Panel: ?No results for input(s): VITAMINB12, FOLATE, FERRITIN, TIBC, IRON, RETICCTPCT in the last 72 hours. ?Sepsis Labs: ?No results for input(s): PROCALCITON, LATICACIDVEN in the last 168 hours. ? ?No results found for this or any previous visit (from the past 240 hour(s)).  ? ? ? ? ? ?Radiology Studies: ?No results found. ? ? ? ? ? ?Scheduled Meds: ? amLODipine  10 mg Oral QPM  ? ARIPiprazole  1 mg Oral Daily  ? aspirin EC  81 mg Oral Daily  ? carvedilol  25 mg Oral BID WC  ? Chlorhexidine Gluconate Cloth  6 each Topical Q0600  ? diclofenac Sodium  2 g Topical QID  ? feeding supplement (NEPRO  CARB STEADY)  237 mL Oral TID BM  ? heparin injection (subcutaneous)  5,000 Units Subcutaneous Q8H  ? hydrALAZINE  50 mg Oral Q8H  ? multivitamin  1 tablet Oral QHS  ? pantoprazole  40 mg Oral BID  ? polyethylene glycol  34 g Oral Daily  ? rosuvastatin  10 mg Oral Daily  ? senna  1 tablet Oral QHS  ? sertraline  100 mg Oral Daily  ? sodium bicarbonate  650 mg Oral TID  ? tamsulosin  0.4 mg Oral Daily  ? ?Continuous Infusions: ? ? LOS: 18 days  ? ? ? ? ?Sidney Ace, MD ?[facility] ? ? ?If 7PM-7AM, please contact night-coverage ? ?01/11/2022, 3:35 PM  ? ?

## 2022-01-11 NOTE — Plan of Care (Signed)
?  Problem: Education: ?Goal: Knowledge of General Education information will improve ?Description: Including pain rating scale, medication(s)/side effects and non-pharmacologic comfort measures ?Outcome: Progressing ?  ?Problem: Health Behavior/Discharge Planning: ?Goal: Ability to manage health-related needs will improve ?Outcome: Progressing ?  ?Problem: Clinical Measurements: ?Goal: Ability to maintain clinical measurements within normal limits will improve ?Outcome: Progressing ?Goal: Will remain free from infection ?Outcome: Progressing ?Goal: Diagnostic test results will improve ?Outcome: Progressing ?Goal: Respiratory complications will improve ?Outcome: Progressing ?Goal: Cardiovascular complication will be avoided ?Outcome: Progressing ?  ?Problem: Activity: ?Goal: Risk for activity intolerance will decrease ?Outcome: Progressing ?  ?Problem: Nutrition: ?Goal: Adequate nutrition will be maintained ?Outcome: Progressing ?  ?Problem: Elimination: ?Goal: Will not experience complications related to bowel motility ?Outcome: Progressing ?Goal: Will not experience complications related to urinary retention ?Outcome: Progressing ?  ?Problem: Coping: ?Goal: Level of anxiety will decrease ?Outcome: Progressing ?  ?Problem: Pain Managment: ?Goal: General experience of comfort will improve ?Outcome: Progressing ?  ?Problem: Safety: ?Goal: Ability to remain free from injury will improve ?Outcome: Progressing ?  ?Problem: Education: ?Goal: Knowledge of disease and its progression will improve ?Outcome: Progressing ?  ?Problem: Skin Integrity: ?Goal: Risk for impaired skin integrity will decrease ?Outcome: Progressing ?  ?Problem: Health Behavior/Discharge Planning: ?Goal: Ability to manage health-related needs will improve ?Outcome: Progressing ?  ?Problem: Clinical Measurements: ?Goal: Complications related to the disease process or treatment will be avoided or minimized ?Outcome: Progressing ?Goal: Dialysis access  will remain free of complications ?Outcome: Progressing ?  ?Problem: Activity: ?Goal: Activity intolerance will improve ?Outcome: Progressing ?  ?Problem: Fluid Volume: ?Goal: Fluid volume balance will be maintained or improved ?Outcome: Progressing ?  ?Problem: Respiratory: ?Goal: Respiratory symptoms related to disease process will be avoided ?Outcome: Progressing ?  ?Problem: Nutritional: ?Goal: Ability to make appropriate dietary choices will improve ?Outcome: Progressing ?  ?Problem: Respiratory: ?Goal: Respiratory symptoms related to disease process will be avoided ?Outcome: Progressing ?  ?Problem: Self-Concept: ?Goal: Body image disturbance will be avoided or minimized ?Outcome: Progressing ?  ?Problem: Urinary Elimination: ?Goal: Progression of disease will be identified and treated ?Outcome: Progressing ?  ?

## 2022-01-12 DIAGNOSIS — R1110 Vomiting, unspecified: Secondary | ICD-10-CM | POA: Diagnosis not present

## 2022-01-12 LAB — BASIC METABOLIC PANEL
Anion gap: 11 (ref 5–15)
BUN: 16 mg/dL (ref 6–20)
CO2: 30 mmol/L (ref 22–32)
Calcium: 8.9 mg/dL (ref 8.9–10.3)
Chloride: 96 mmol/L — ABNORMAL LOW (ref 98–111)
Creatinine, Ser: 4.36 mg/dL — ABNORMAL HIGH (ref 0.61–1.24)
GFR, Estimated: 15 mL/min — ABNORMAL LOW (ref >60)
Glucose, Bld: 83 mg/dL (ref 70–99)
Potassium: 3.9 mmol/L (ref 3.5–5.1)
Sodium: 137 mmol/L (ref 135–145)

## 2022-01-12 MED ORDER — CARVEDILOL 12.5 MG PO TABS
12.5000 mg | ORAL_TABLET | Freq: Two times a day (BID) | ORAL | Status: DC
Start: 2022-01-12 — End: 2022-01-17
  Administered 2022-01-12 – 2022-01-16 (×8): 12.5 mg via ORAL
  Filled 2022-01-12 (×8): qty 1

## 2022-01-12 NOTE — Plan of Care (Signed)
  Problem: Activity: Goal: Risk for activity intolerance will decrease Outcome: Progressing   Problem: Nutrition: Goal: Adequate nutrition will be maintained Outcome: Progressing   

## 2022-01-12 NOTE — Progress Notes (Signed)
Physical Therapy Treatment ?Patient Details ?Name: Randall [name] ?MRN: [id number] ?DOB: 1961/05/07 ?Today's Date: 01/12/2022 ? ? ?History of Present Illness Randall [name] is a 61 y.o. male seen for evaluation of recurrent vomiting at the request of Dr. Judd Gaudier. Patient has a PMH of dementia, hypertension, diabetes, chronic kidney disease, CHF.  Patient presented to the [facility] ED for chief complaint of nausea, vomiting and weakness. Upon presentation to the ED yesterday, vital signs were stable with blood pressure 120/72, heart rate 67, respirations 18, temperature 98.7, pulse oximetry 97%. Labs were significant for new AKI with Cr 3.5, normal electrolytes GFR 19, glucose 176, WBC 17.9, UA unremarkable.  Imaging studies revealed CT of the abdomen pelvis revealed a possible tiny hiatal hernia, constipation, otherwise no acute intra-abdominal or intrapelvic abnormality.  Aortic atherosclerosis.  He is being admitted for IV hydration.  GI is consulted for evaluation and management of recurrent vomiting. ? ?  ?PT Comments  ? ? Patient tolerated session fairly well today. No pain reported, however patient was more lethargic compared to last session. Patient perform bed level exercises to work on BLE strengthening. Continues to not require assistance with bed mobility, and can complete sit to stands from EOB with no AD or physical assistance. Upon 10th sit to stand from EOB, patient began swaying in standing and reported mild dizziness. Blood pressure was taken and was reported at 77/24mHg. RN notified. Patient returned to bed, and was left with all needs met. Patient would continue to benefit from skilled physical therapy in order to optimize patient's return to PLOF. Continue to recommend HHPT upon discharge from acute hospitalization. ?      ?Recommendations for follow up therapy are one component of a multi-disciplinary discharge planning process, led by the attending physician.  Recommendations may be  updated based on patient status, additional functional criteria and insurance authorization. ? ?Follow Up Recommendations ? Home health PT ?  ?  ?Assistance Recommended at Discharge Intermittent Supervision/Assistance  ?Patient can return home with the following A little help with walking and/or transfers;A little help with bathing/dressing/bathroom;Help with stairs or ramp for entrance;Assist for transportation;Direct supervision/assist for financial management;Direct supervision/assist for medications management;Assistance with cooking/housework ?  ?Equipment Recommendations ? Rolling walker (2 wheels)  ?  ?Recommendations for Other Services   ? ? ?  ?Precautions / Restrictions Precautions ?Precautions: Fall ?Restrictions ?Weight Bearing Restrictions: No  ?  ? ?Mobility ? Bed Mobility ?Overal bed mobility: Modified Independent ?Bed Mobility: Supine to Sit, Sit to Supine ?  ?  ?Supine to sit: Modified independent (Device/Increase time), HOB elevated ?Sit to supine: Modified independent (Device/Increase time), HOB elevated ?  ?General bed mobility comments: no physical assist needed ?  ? ?Transfers ?Overall transfer level: Needs assistance ?Equipment used: None ?Transfers: Sit to/from Stand ?Sit to Stand: Modified independent (Device/Increase time) ?  ?  ?  ?  ?  ?General transfer comment: x10 additional sit to stands for strengthening- no physical assistance required for these ?  ? ?Ambulation/Gait ?Ambulation/Gait assistance:  (deferred due to dizziness and low BP) ?  ?  ?  ?  ?  ?  ?  ? ? ?Stairs ?  ?  ?  ?  ?  ? ? ?Wheelchair Mobility ?  ? ?Modified Rankin (Stroke Patients Only) ?  ? ? ?  ?Balance Overall balance assessment: Needs assistance ?Sitting-balance support: Feet supported ?Sitting balance-Leahy Scale: Good ?Sitting balance - Comments: good static and dynamic balance during ther activity ?  ?Standing balance  support: No upper extremity supported, During functional activity ?Standing balance-Leahy Scale:  Fair ?Standing balance comment: SBA for dynamic standing tasks without use of AD ?  ?  ?  ?  ?  ?  ?  ?  ?  ?  ?  ?  ? ?  ?Cognition Arousal/Alertness: Lethargic ?Behavior During Therapy: Flat affect ?Overall Cognitive Status: History of cognitive impairments - at baseline ?Area of Impairment: Following commands, Safety/judgement, Problem solving ?  ?  ?  ?  ?  ?  ?  ?  ?  ?  ?  ?Following Commands: Follows one step commands consistently ?Safety/Judgement: Decreased awareness of safety ?  ?Problem Solving: Slow processing, Decreased initiation, Requires verbal cues ?General Comments: Pt with slow processing and cuing for sequencing and initiation ?  ?  ? ?  ?Exercises General Exercises - Lower Extremity ?Ankle Circles/Pumps: Supine, 20 reps, AROM, Both ?Heel Slides: Supine, 15 reps, AROM, Both ?Straight Leg Raises: Supine, 15 reps, AROM, Both ?Other Exercises ?Other Exercises: x10 sit to stands from EOB with no AD ? ?  ?General Comments General comments (skin integrity, edema, etc.): SpO2 remained >90% throughout session on room air, HR: 77-80bpm, BP: 77/54 - after bed level exercises ?  ?  ? ?Pertinent Vitals/Pain Pain Assessment ?Pain Assessment: No/denies pain ?Pain Intervention(s): Monitored during session  ? ? ?Home Living   ?  ?  ?  ?  ?  ?  ?  ?  ?  ?   ?  ?Prior Function    ?  ?  ?   ? ?PT Goals (current goals can now be found in the care plan section) Acute Rehab PT Goals ?Patient Stated Goal: to return home ?PT Goal Formulation: With patient/family ?Time For Goal Achievement: 01/20/22 ?Potential to Achieve Goals: Fair ?Progress towards PT goals: Progressing toward goals ? ?  ?Frequency ? ? ? Min 2X/week ? ? ? ?  ?PT Plan Current plan remains appropriate  ? ? ?Co-evaluation   ?  ?  ?  ?  ? ?  ?AM-PAC PT "6 Clicks" Mobility   ?Outcome Measure ? Help needed turning from your back to your side while in a flat bed without using bedrails?: None ?Help needed moving from lying on your back to sitting on the side  of a flat bed without using bedrails?: None ?Help needed moving to and from a bed to a chair (including a wheelchair)?: A Little ?Help needed standing up from a chair using your arms (e.g., wheelchair or bedside chair)?: A Little ?Help needed to walk in hospital room?: A Little ?Help needed climbing 3-5 steps with a railing? : A Lot ?6 Click Score: 19 ? ?  ?End of Session Equipment Utilized During Treatment: Gait belt ?Activity Tolerance: Patient limited by lethargy ?Patient left: in bed;with call bell/phone within reach;with bed alarm set ?Nurse Communication: Mobility status;Other (comment) (BP) ?PT Visit Diagnosis: Unsteadiness on feet (R26.81);Muscle weakness (generalized) (M62.81);Difficulty in walking, not elsewhere classified (R26.2) ?  ? ? ?Time: 1001-1016 ?PT Time Calculation (min) (ACUTE ONLY): 15 min ? ?Charges:  $Therapeutic Exercise: 8-22 mins          ?          ? ?Iva Boop, PT  ?01/12/22. 10:33 AM ? ? ?

## 2022-01-12 NOTE — Progress Notes (Signed)
It is possible that [employer] is within network with patient's insurance. Rerouting referral today.  ?

## 2022-01-12 NOTE — Progress Notes (Signed)
[facility]  ROUNDING NOTE   Subjective:  Pt is admitted to [facility] on 12/22/2021 with intractable vomiting on 12/22/2021 with intractable vomiting which has been going for weeks.  He noted to have mild leukocytosis and AKI on admission patient has a past medical history significant for dementia, hypertension, HFpEF, CKD 3a and CVA.  We were asked to see the patient for AKI with a significantly elevated BUN and creatinine  Patient seen sitting up in bed Breakfast tray at bedside, currently set up in front of patient Sister at bedside Appetite remains poor Denies nausea, vomiting and diarrhea Denies shortness of breath  Objective:  Vital signs in last 24 hours:  Temp:  [98.4 F (36.9 C)-99.3 F (37.4 C)] 98.4 F (36.9 C) (03/09 0823) Pulse Rate:  [68-73] 71 (03/09 1023) Resp:  [16-20] 20 (03/09 0823) BP: (109-142)/(64-73) 109/64 (03/09 1023) SpO2:  [99 %-100 %] 99 % (03/09 0823)  Weight change:  Filed Weights   01/07/22 1808 01/11/22 0848 01/11/22 1155  Weight: 74.6 kg 71.4 kg 70.8 kg    Intake/Output: I/O last 3 completed shifts: In: 0  Out: -1    Intake/Output this shift:  Total I/O In: 240 [P.O.:240] Out: -   Physical Exam: General: NAD  Head: Normocephalic, atraumatic. Moist oral mucosal membranes  Eyes: Anicteric  Lungs:  Clear to auscultation, normal effort  Heart: S1S2 no rubs  Abdomen:  Soft, nontender, bowel sounds present  Extremities:  No peripheral edema.  Neurologic: Alert following simple commands  Skin: No acute rash  Access: Rt IJ permcath placed by Dr Lucky Cowboy on 34/37/35    Basic Metabolic Panel: Recent Labs  Lab 01/06/22 0450 01/07/22 0449 01/08/22 0552 01/09/22 0544 01/10/22 1022 01/11/22 0534 01/12/22 0519  NA 140   < > 140 140 138 137 137  K 3.8   < > 3.5 3.5 4.2 3.8 3.9  CL 101   < > 99 100 98 100 96*  CO2 30   < > '30 31 29 28 30  ' GLUCOSE 101*   < > 83 85 105* 113* 83  BUN 36*   < > 20 23* 27* 28* 16  CREATININE 7.90*   <  > 5.00* 5.76* 6.37* 6.27* 4.36*  CALCIUM 8.5*   < > 8.4* 8.4* 9.0 8.8* 8.9  MG 2.0  --   --   --   --   --   --    < > = values in this interval not displayed.     Liver Function Tests: No results for input(s): AST, ALT, ALKPHOS, BILITOT, PROT, ALBUMIN in the last 168 hours.  No results for input(s): LIPASE, AMYLASE in the last 168 hours.  No results for input(s): AMMONIA in the last 168 hours.  CBC: Recent Labs  Lab 01/07/22 0449 01/10/22 0450  WBC 8.0 10.8*  HGB 8.7* 8.5*  HCT 26.3* 25.7*  MCV 97.0 98.1  PLT 240 236     Cardiac Enzymes: No results for input(s): CKTOTAL, CKMB, CKMBINDEX, TROPONINI in the last 168 hours.  BNP: Invalid input(s): POCBNP  CBG: No results for input(s): GLUCAP in the last 168 hours.   Microbiology: Results for orders placed or performed during the hospital encounter of 12/22/21  Resp Panel by RT-PCR (Flu A&B, Covid) Nasopharyngeal Swab     Status: None   Collection Time: 12/22/21 11:18 PM   Specimen: Nasopharyngeal Swab; Nasopharyngeal(NP) swabs in vial transport medium  Result Value Ref Range Status   SARS Coronavirus 2 by  RT PCR NEGATIVE NEGATIVE Final    Comment: (NOTE) SARS-CoV-2 target nucleic acids are NOT DETECTED.  The SARS-CoV-2 RNA is generally detectable in upper respiratory specimens during the acute phase of infection. The lowest concentration of SARS-CoV-2 viral copies this assay can detect is 138 copies/mL. A negative result does not preclude SARS-Cov-2 infection and should not be used as the sole basis for treatment or other patient management decisions. A negative result may occur with  improper specimen collection/handling, submission of specimen other than nasopharyngeal swab, presence of viral mutation(s) within the areas targeted by this assay, and inadequate number of viral copies(<138 copies/mL). A negative result must be combined with clinical observations, patient history, and epidemiological information.  The expected result is Negative.  Fact Sheet for Patients:  EntrepreneurPulse.com.au  Fact Sheet for Healthcare Providers:  IncredibleEmployment.be  This test is no t yet approved or cleared by the [place] FDA and  has been authorized for detection and/or diagnosis of SARS-CoV-2 by FDA under an Emergency Use Authorization (EUA). This EUA will remain  in effect (meaning this test can be used) for the duration of the COVID-19 declaration under Section 564(b)(1) of the Act, 21 U.S.C.section 360bbb-3(b)(1), unless the authorization is terminated  or revoked sooner.       Influenza A by PCR NEGATIVE NEGATIVE Final   Influenza B by PCR NEGATIVE NEGATIVE Final    Comment: (NOTE) The Xpert Xpress SARS-CoV-2/FLU/RSV plus assay is intended as an aid in the diagnosis of influenza from Nasopharyngeal swab specimens and should not be used as a sole basis for treatment. Nasal washings and aspirates are unacceptable for Xpert Xpress SARS-CoV-2/FLU/RSV testing.  Fact Sheet for Patients: EntrepreneurPulse.com.au  Fact Sheet for Healthcare Providers: IncredibleEmployment.be  This test is not yet approved or cleared by the [place] FDA and has been authorized for detection and/or diagnosis of SARS-CoV-2 by FDA under an Emergency Use Authorization (EUA). This EUA will remain in effect (meaning this test can be used) for the duration of the COVID-19 declaration under Section 564(b)(1) of the Act, 21 U.S.C. section 360bbb-3(b)(1), unless the authorization is terminated or revoked.  Performed at [facility], [address] [place], [place] [address]     Coagulation Studies: No results for input(s): LABPROT, INR in the last 72 hours.  Urinalysis: No results for input(s): COLORURINE, LABSPEC, PHURINE, GLUCOSEU, HGBUR, BILIRUBINUR, KETONESUR, PROTEINUR, UROBILINOGEN, NITRITE, LEUKOCYTESUR in the last  72 hours.  Invalid input(s): APPERANCEUR     Imaging: No results found.   Medications:      ARIPiprazole  1 mg Oral Daily   aspirin EC  81 mg Oral Daily   carvedilol  12.5 mg Oral BID WC   Chlorhexidine Gluconate Cloth  6 each Topical Q0600   diclofenac Sodium  2 g Topical QID   feeding supplement (NEPRO CARB STEADY)  237 mL Oral TID BM   heparin injection (subcutaneous)  5,000 Units Subcutaneous Q8H   multivitamin  1 tablet Oral QHS   pantoprazole  40 mg Oral BID   polyethylene glycol  34 g Oral Daily   rosuvastatin  10 mg Oral Daily   senna  1 tablet Oral QHS   sertraline  100 mg Oral Daily   sodium bicarbonate  650 mg Oral TID   tamsulosin  0.4 mg Oral Daily   acetaminophen **OR** acetaminophen, hydrALAZINE, ondansetron **OR** ondansetron (ZOFRAN) IV, ondansetron (ZOFRAN) IV, sorbitol, milk of mag, mineral oil, glycerin (SMOG) enema  Assessment/ Plan:  61 y.o. male with  a past medical history significant for dementia, hypertension, heart failure, CKD 3a and CVA admitted to [facility] on 12/22/2021 with intractable vomiting.  Also noted to have mild leukocytosis and AKI.   #1 AKI on CKD 3a with baseline creatinine of 1.4 and EGFR of 52.    It is felt that acute kidney injury could have been contributed from usage of Advil/NSAIDs and from dehydration secondary to intractable vomiting.   Patient creatinine remained high at around 8. Patient will require renal replacement therapy for now as patient creatinine since his last dialysis has increased steadily from 5. To -6.3 to 7.5 to around 7.7-- 8. Received dialysis yesterday, tolerated well. No UF.   Patient will require dialysis twice weekly at discharge while renal recovery continues.  Dialysis coordinator and case manager currently working to have dialysis clinic approved by insurance.  #2 recurrent vomiting likely multifactorial including advancing dementia ,medication side effect or functional dyspepsia.  GI is consulted.  EGD  completed with no signs of active bleeding.  Did see gastritis, small hiatal hernia and LA grade B reflux esophagitis.  Concerning areas biopsied.  Resolved    3. Anemia of chronic kidney disease  Normocytic Lab Results  Component Value Date   HGB 8.5 (L) 01/10/2022  Hemoglobin remains steady from 8.5-8.7  4.   Hypertension, well controlled.  Currently receiving amlodipine, carvedilol, hydralazine, and tamsulosin.   BP stable   LOS: [place] 3/9/20231:10 PM

## 2022-01-12 NOTE — Progress Notes (Signed)
?PROGRESS NOTE ? ? ? ?Randall [name]  TZG:[id number] DOB: 02-May-1961 DOA: 12/22/2021 ?PCP: Marlowe Aschoff, MD  ? ? ?Brief Narrative:  ?Patient initially admitted to [facility] on 2/16 with chief complaint of intractable nausea and vomiting progressing over the past several weeks.  Patient had significant AKI on admission.  History of HFpEF, CKD stage IIIa and CVA.  Nephrology involved.  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  Started on intermittent hemodialysis. ? ?Current.  Patient is medically stable however we are lacking a safe disposition plan.  Outpatient dialysis coordinator working on setting up HD chair time. ? ? ?Assessment & Plan: ?  ?Principal Problem: ?  Recurrent vomiting ?Active Problems: ?  Leukocytosis ?  Dementia ([place]) ?  Chronic systolic CHF (congestive heart failure) ([place]) ?  History of CVA (cerebrovascular accident) ?  Acute renal failure superimposed on stage 3a chronic kidney disease ([place]) ?  Moderate major depression ([place]) ?  Intractable nausea and vomiting ?  Essential hypertension ? ?Acute renal failure superimposed on stage 3a chronic kidney disease ([place]) ?Good urinary output though doesn't appear to be accurately recorded by nursing. ?Nephrology was consulted and they decided to proceed with hemodialysis.  Received 3 sessions of initial dialysis with temporary catheter which was then switched with permanent cath. GFR remains below 15 ?- next dialysis planned for 3/8, is getting dialyzed intermittently. Nephrology at work on outpatient chair ?- last dialysis was 3/8 ?- has right IJ tunneled dialysis catheter placed by vascular surgery on 2/24 ?Plan: ?Nephrology following for inpatient hemodialysis needs ?Discharge delayed by lack of safe disposition plan as the patient's insurance does not cover outpatient hemodialysis ?Next HD planned 3/10 ? ?Recurrent vomiting- (present on admission) ?resolved. ?Recurrent episodes with second hospitalization in 2 weeks for the same ?No  definite etiology identified. ?GI was consulted and patient underwent EGD on 12/26/2020 which shows esophagitis, some esophageal dysmotility, gastritis and few nonbleeding erosions in duodenum, biopsies were taken, which were negative. Vomiting and nausea resolved, tolerating diet ?-Continue with Protonix ?-Okay for diet as tolerated ?  ?Anemia ?Hgb 8s from 11s on admission.  ?No hematochezia or melena or other bleeding.  ?Likely 2/2 new renal dysfunction ?- monitor  ?-Currently no indication for transfusion ?  ?Hypokalemia ?Resolved w/ supplementation ?  ?Essential hypertension- (present on admission) ?Blood pressure improved after adding hydralazine. ?-Hold Entresto as advised by nephrology while waiting for any renal function recovery. ?-Continue amlodipine  ?-Continue hydralazine, increase dose today ?-Continue to monitor ?  ?Moderate major depression ([place]) ?- Continue Abilify and Zoloft ?  ? ?History of CVA (cerebrovascular accident) ?Continue aspirin and statin ?  ?Chronic diastolic CHF (congestive heart failure) ([place])- (present on admission) ?Currently euvolemic. Ef 50% on 2021 TTE w/ grade 1 dysfunction ?-Continue Coreg, Carvedilol  ?-holding entresto as above ?  ?Dementia ([place])- (present on admission) ?Continue Namenda and sertraline ?  ?Constipation ?Large BM 3/5 ?- cont miralax/senna ? ? ?DVT prophylaxis: SQ heparin ?Code Status: Full ?Family Communication: Son at bedside 3/8, sister at bedside 3/9 ?Disposition Plan: Status is: Inpatient ?Remains inpatient appropriate because: Unsafe discharge plan.  Medically stable ? ? ?Level of care: Med-Surg ? ?Consultants:  ?Nephrology ? ?Procedures:  ?Tunneled dialysis catheter placement ? ?Antimicrobials: ?None ? ? ?Subjective: ?Seen and examined.  Resting in bed.  Appears fatigued.  No pain complaints.  Family member at bedside ? ?Objective: ?Vitals:  ? 01/12/22 0137 01/12/22 0507 01/12/22 0823 01/12/22 1023  ?BP: 139/72 (!) 142/68 132/69 109/64  ?Pulse: 70  73 72  71  ?Resp: 16 18 20    ?Temp:  99.2 ?F (37.3 ?C) 98.4 ?F (36.9 ?C)   ?TempSrc:  Oral Oral   ?SpO2: 100% 100% 99%   ?Weight:      ?Height:      ? ? ?Intake/Output Summary (Last 24 hours) at 01/12/2022 1342 ?Last data filed at 01/12/2022 1029 ?Gross per 24 hour  ?Intake 240 ml  ?Output 0 ml  ?Net 240 ml  ? ?Filed Weights  ? 01/07/22 1808 01/11/22 0848 01/11/22 1155  ?Weight: 74.6 kg 71.4 kg 70.8 kg  ? ? ?Examination: ? ?General exam: No acute distress.  Appears fatigued ?Respiratory system: Clear to auscultation. Respiratory effort normal. ?Cardiovascular system: S1-S2, RRR, no murmurs, no pedal edema ?Gastrointestinal system: Soft, NT/ND, normal bowel sounds ?Central nervous system: Alert and oriented. No focal neurological deficits. ?Extremities: Symmetric 5 x 5 power. ?Skin: No rashes, lesions or ulcers ?Psychiatry: Judgement and insight appear normal. Mood & affect appropriate.  ? ? ? ?Data Reviewed: I have personally reviewed following labs and imaging studies ? ?CBC: ?Recent Labs  ?Lab 01/07/22 ?0102 01/10/22 ?0450  ?WBC 8.0 10.8*  ?HGB 8.7* 8.5*  ?HCT 26.3* 25.7*  ?MCV 97.0 98.1  ?PLT 240 236  ? ?Basic Metabolic Panel: ?Recent Labs  ?Lab 01/06/22 ?0450 01/07/22 ?7253 01/08/22 ?6644 01/09/22 ?0347 01/10/22 ?1022 01/11/22 ?4259 01/12/22 ?5638  ?NA 140   < > 140 140 138 137 137  ?K 3.8   < > 3.5 3.5 4.2 3.8 3.9  ?CL 101   < > 99 100 98 100 96*  ?CO2 30   < > 30 31 29 28 30   ?GLUCOSE 101*   < > 83 85 105* 113* 83  ?BUN 36*   < > 20 23* 27* 28* 16  ?CREATININE 7.90*   < > 5.00* 5.76* 6.37* 6.27* 4.36*  ?CALCIUM 8.5*   < > 8.4* 8.4* 9.0 8.8* 8.9  ?MG 2.0  --   --   --   --   --   --   ? < > = values in this interval not displayed.  ? ?GFR: ?Estimated Creatinine Clearance: 18 mL/min (A) (by C-G formula based on SCr of 4.36 mg/dL (H)). ?Liver Function Tests: ?No results for input(s): AST, ALT, ALKPHOS, BILITOT, PROT, ALBUMIN in the last 168 hours. ?No results for input(s): LIPASE, AMYLASE in the last 168 hours. ?No results  for input(s): AMMONIA in the last 168 hours. ?Coagulation Profile: ?No results for input(s): INR, PROTIME in the last 168 hours. ?Cardiac Enzymes: ?No results for input(s): CKTOTAL, CKMB, CKMBINDEX, TROPONINI in the last 168 hours. ?BNP (last 3 results) ?No results for input(s): PROBNP in the last 8760 hours. ?HbA1C: ?No results for input(s): HGBA1C in the last 72 hours. ?CBG: ?No results for input(s): GLUCAP in the last 168 hours. ?Lipid Profile: ?No results for input(s): CHOL, HDL, LDLCALC, TRIG, CHOLHDL, LDLDIRECT in the last 72 hours. ?Thyroid Function Tests: ?No results for input(s): TSH, T4TOTAL, FREET4, T3FREE, THYROIDAB in the last 72 hours. ?Anemia Panel: ?No results for input(s): VITAMINB12, FOLATE, FERRITIN, TIBC, IRON, RETICCTPCT in the last 72 hours. ?Sepsis Labs: ?No results for input(s): PROCALCITON, LATICACIDVEN in the last 168 hours. ? ?No results found for this or any previous visit (from the past 240 hour(s)).  ? ? ? ? ? ?Radiology Studies: ?No results found. ? ? ? ? ? ?Scheduled Meds: ? ARIPiprazole  1 mg Oral Daily  ? aspirin EC  81 mg Oral Daily  ?  carvedilol  12.5 mg Oral BID WC  ? Chlorhexidine Gluconate Cloth  6 each Topical Q0600  ? diclofenac Sodium  2 g Topical QID  ? feeding supplement (NEPRO CARB STEADY)  237 mL Oral TID BM  ? heparin injection (subcutaneous)  5,000 Units Subcutaneous Q8H  ? multivitamin  1 tablet Oral QHS  ? pantoprazole  40 mg Oral BID  ? polyethylene glycol  34 g Oral Daily  ? rosuvastatin  10 mg Oral Daily  ? senna  1 tablet Oral QHS  ? sertraline  100 mg Oral Daily  ? sodium bicarbonate  650 mg Oral TID  ? tamsulosin  0.4 mg Oral Daily  ? ?Continuous Infusions: ? ? LOS: 19 days  ? ? ? ? ?Sidney Ace, MD ?[facility] ? ? ?If 7PM-7AM, please contact night-coverage ? ?01/12/2022, 1:42 PM  ? ?

## 2022-01-12 NOTE — Progress Notes (Signed)
Mobility Specialist - Progress Note ? ? ? 01/12/22 1600  ?Mobility  ?Activity Ambulated with assistance in hallway;Stood at bedside;Dangled on edge of bed  ?Level of Assistance Standby assist, set-up cues, supervision of patient - no hands on  ?Assistive Device Front wheel walker  ?Distance Ambulated (ft) 200 ft  ?Activity Response Tolerated well  ?$Mobility charge 1 Mobility  ? ? ?Pre-mobility:69 HR, 119/64 BP, ? ?Pt supine upon arrival using RA. Pt completes EOB + STS indep and continues ambulation 160ft with supervision ----- vc to look forwards, voiced no complaints. Completes obstacle course navigation. Returns to bed with alarm set and needs in reach. ? ?Kathee Delton ?Mobility Specialist ?01/12/22, 4:28 PM ? ? ? ? ?

## 2022-01-12 NOTE — Progress Notes (Signed)
Occupational Therapy Treatment ?Patient Details ?Name: Randall [name] ?MRN: [id number] ?DOB: [birth year] ?Today's Date: 01/12/2022 ? ? ?History of present illness Randall [name] is a 61 y.o. male seen for evaluation of recurrent vomiting at the request of Dr. Judd Gaudier. Patient has a PMH of dementia, hypertension, diabetes, chronic kidney disease, CHF.  Patient presented to the [facility] ED for chief complaint of nausea, vomiting and weakness. Upon presentation to the ED yesterday, vital signs were stable with blood pressure 120/72, heart rate 67, respirations 18, temperature 98.7, pulse oximetry 97%. Labs were significant for new AKI with Cr 3.5, normal electrolytes GFR 19, glucose 176, WBC 17.9, UA unremarkable.  Imaging studies revealed CT of the abdomen pelvis revealed a possible tiny hiatal hernia, constipation, otherwise no acute intra-abdominal or intrapelvic abnormality.  Aortic atherosclerosis.  He is being admitted for IV hydration.  GI is consulted for evaluation and management of recurrent vomiting. ?  ?OT comments ? Upon entering the room, pt supine in bed and appears very lethargic. His sister is present in room during session. Pt needing mod A for bed mobility and keeps eyes closed throughout. BP taken in supine with results of 77/41 and pt placed into trendelenburg for 5 minutes with BP still at 78/48. RN notified. Pt remains in bed with all needs within reach and family member with pt. No further OT intervention at this time secondary to hypotension.   ? ?Recommendations for follow up therapy are one component of a multi-disciplinary discharge planning process, led by the attending physician.  Recommendations may be updated based on patient status, additional functional criteria and insurance authorization. ?   ?Follow Up Recommendations ? Home health OT  ?  ?Assistance Recommended at Discharge Intermittent Supervision/Assistance  ?Patient can return home with the following ? A little help with  walking and/or transfers;Assistance with cooking/housework;Assist for transportation;Direct supervision/assist for medications management;Direct supervision/assist for financial management;Help with stairs or ramp for entrance;A little help with bathing/dressing/bathroom ?  ?Equipment Recommendations ? BSC/3in1  ?  ?   ?Precautions / Restrictions Precautions ?Precautions: Fall ?Restrictions ?Weight Bearing Restrictions: No  ? ? ?  ? ?Mobility Bed Mobility ?  ?Bed Mobility: Rolling ?Rolling: Mod assist ?  ?  ?  ?  ?  ?  ? ? ?  ?  ?  ?   ? ?ADL either performed or assessed with clinical judgement  ? ? ? ?Vision Patient Visual Report: No change from baseline ?  ?  ?   ?   ? ?Cognition Arousal/Alertness: Lethargic ?Behavior During Therapy: Flat affect ?Overall Cognitive Status: History of cognitive impairments - at baseline ?  ?  ?  ?  ?  ?  ?  ?  ?  ?  ?  ?  ?  ?  ?  ?  ?  ?  ?  ?   ?   ?   ?General Comments SpO2 remained >90% throughout session on room air, HR: 77-80bpm, BP: 77/54 - after bed level exercises  ? ? ?Pertinent Vitals/ Pain       Pain Assessment ?Pain Assessment: No/denies pain ? ?   ?   ? ?Frequency ? Min 2X/week  ? ? ? ? ?  ?Progress Toward Goals ? ?OT Goals(current goals can now be found in the care plan section) ? Progress towards OT goals: Progressing toward goals ? ?Acute Rehab OT Goals ?Patient Stated Goal: to go home ?OT Goal Formulation: With patient/family ?Time For Goal Achievement: 01/19/22 ?Potential to  Achieve Goals: Good  ?Plan Discharge plan remains appropriate;Frequency remains appropriate   ? ?   ?AM-PAC OT "6 Clicks" Daily Activity     ?Outcome Measure ? ? Help from another person eating meals?: None ?Help from another person taking care of personal grooming?: None ?Help from another person toileting, which includes using toliet, bedpan, or urinal?: A Little ?Help from another person bathing (including washing, rinsing, drying)?: A Little ?Help from another person to put on and taking  off regular upper body clothing?: None ?Help from another person to put on and taking off regular lower body clothing?: A Little ?6 Click Score: 21 ? ?  ?End of Session   ? ?OT Visit Diagnosis: Other abnormalities of gait and mobility (R26.89) ?  ?Activity Tolerance Treatment limited secondary to medical complications (Comment) ?  ?Patient Left in bed;with call bell/phone within reach;with bed alarm set;with family/visitor present ?  ?Nurse Communication Mobility status;Other (comment) (hypotension) ?  ? ?   ? ?Time: 1042-1100 ?OT Time Calculation (min): 18 min ? ?Charges: OT General Charges ?$OT Visit: 1 Visit ?OT Treatments ?$Therapeutic Activity: 8-22 mins ? ?Darleen Crocker, MS, OTR/L , CBIS ?ascom [phone]  ?01/12/22, 1:38 PM  ?

## 2022-01-13 DIAGNOSIS — R1110 Vomiting, unspecified: Secondary | ICD-10-CM | POA: Diagnosis not present

## 2022-01-13 LAB — BASIC METABOLIC PANEL
Anion gap: 8 (ref 5–15)
BUN: 24 mg/dL — ABNORMAL HIGH (ref 6–20)
CO2: 31 mmol/L (ref 22–32)
Calcium: 8.4 mg/dL — ABNORMAL LOW (ref 8.9–10.3)
Chloride: 97 mmol/L — ABNORMAL LOW (ref 98–111)
Creatinine, Ser: 5.6 mg/dL — ABNORMAL HIGH (ref 0.61–1.24)
GFR, Estimated: 11 mL/min — ABNORMAL LOW (ref >60)
Glucose, Bld: 90 mg/dL (ref 70–99)
Potassium: 3.7 mmol/L (ref 3.5–5.1)
Sodium: 136 mmol/L (ref 135–145)

## 2022-01-13 NOTE — Progress Notes (Signed)
Mobility Specialist - Progress Note ? ? ? 01/13/22 1231  ?Mobility  ?Activity Ambulated with assistance in hallway;Stood at bedside;Dangled on edge of bed  ?Level of Assistance Standby assist, set-up cues, supervision of patient - no hands on  ?Assistive Device Front wheel walker  ?Distance Ambulated (ft) 165 ft  ?Activity Response Tolerated well  ?$Mobility charge 1 Mobility  ? ? ?During mobility: 77 HR, BP,99% SpO2 ? ?Pt supine upon arrival using RA. Pt completes bed mobility + STS with supervision and ambulates 1 lap around nursing station --- voices no complaints. Pt returns to bed with alarm set and needs in reach. ? ?Randall [name] ?Mobility Specialist ?01/13/22, 12:35 PM ? ? ? ?

## 2022-01-13 NOTE — Plan of Care (Signed)
  Problem: Activity: Goal: Risk for activity intolerance will decrease Outcome: Progressing   Problem: Nutrition: Goal: Adequate nutrition will be maintained Outcome: Progressing   Problem: Coping: Goal: Level of anxiety will decrease Outcome: Progressing   Problem: Skin Integrity: Goal: Risk for impaired skin integrity will decrease Outcome: Progressing   

## 2022-01-13 NOTE — Progress Notes (Signed)
?PROGRESS NOTE ? ? ? ?Randall [name]  JAS:[id number] DOB: 05-02-1961 DOA: 12/22/2021 ?PCP: Marlowe Aschoff, MD  ? ? ?Brief Narrative:  ?Patient initially admitted to [facility] on 2/16 with chief complaint of intractable nausea and vomiting progressing over the past several weeks.  Patient had significant AKI on admission.  History of HFpEF, CKD stage IIIa and CVA.  Nephrology involved.  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  Started on intermittent hemodialysis. ? ?Current.  Patient is medically stable however we are lacking a safe disposition plan.  Outpatient dialysis coordinator working on setting up HD chair time. ? ? ?Assessment & Plan: ?  ?Principal Problem: ?  Recurrent vomiting ?Active Problems: ?  Leukocytosis ?  Dementia ([place]) ?  Chronic systolic CHF (congestive heart failure) ([place]) ?  History of CVA (cerebrovascular accident) ?  Acute renal failure superimposed on stage 3a chronic kidney disease ([place]) ?  Moderate major depression ([place]) ?  Intractable nausea and vomiting ?  Essential hypertension ? ?Acute renal failure superimposed on stage 3a chronic kidney disease ([place]) ?Good urinary output though doesn't appear to be accurately recorded by nursing. ?Nephrology was consulted and they decided to proceed with hemodialysis.  Received 3 sessions of initial dialysis with temporary catheter which was then switched with permanent cath. GFR remains below 15 ?- next dialysis planned for 3/8, is getting dialyzed intermittently. Nephrology at work on outpatient chair ?- last dialysis was 3/8 ?- has right IJ tunneled dialysis catheter placed by vascular surgery on 2/24 ?Plan: ?Nephrology following for inpatient hemodialysis needs ?HD planned today 3/10 ?Discharge delayed by lack of safe disposition plan as the patient's insurance does not cover outpatient hemodialysis ? ?Recurrent vomiting- (present on admission) ?resolved. ?Recurrent episodes with second hospitalization in 2 weeks for the  same ?No definite etiology identified. ?GI was consulted and patient underwent EGD on 12/26/2020 which shows esophagitis, some esophageal dysmotility, gastritis and few nonbleeding erosions in duodenum, biopsies were taken, which were negative. Vomiting and nausea resolved, tolerating diet ?-Continue with Protonix ?-Okay for diet as tolerated ?  ?Anemia ?Hgb 8s from 11s on admission.  ?No hematochezia or melena or other bleeding.  ?Likely 2/2 new renal dysfunction ?- monitor  ?-Currently no indication for transfusion ?  ?Hypokalemia ?Resolved w/ supplementation ?  ?Essential hypertension- (present on admission) ?Blood pressure improved after adding hydralazine. ?-Hold Entresto as advised by nephrology while waiting for any renal function recovery. ?-Continue amlodipine  ?-Continue hydralazine, increase dose today ?-Continue to monitor ?  ?Moderate major depression ([place]) ?- Continue Abilify and Zoloft ?  ? ?History of CVA (cerebrovascular accident) ?Continue aspirin and statin ?  ?Chronic diastolic CHF (congestive heart failure) ([place])- (present on admission) ?Currently euvolemic. Ef 50% on 2021 TTE w/ grade 1 dysfunction ?-Continue Coreg, Carvedilol  ?-holding entresto as above ?  ?Dementia ([place])- (present on admission) ?Continue Namenda and sertraline ?  ?Constipation ?Large BM 3/5 ?- cont miralax/senna ? ? ?DVT prophylaxis: SQ heparin ?Code Status: Full ?Family Communication: Son at bedside 3/8, sister at bedside 3/9, 3/10 ?Disposition Plan: Status is: Inpatient ?Remains inpatient appropriate because: Unsafe discharge plan.  Medically stable ? ? ?Level of care: Med-Surg ? ?Consultants:  ?Nephrology ? ?Procedures:  ?Tunneled dialysis catheter placement ? ?Antimicrobials: ?None ? ? ?Subjective: ?Seen and examined.  Resting in bed.  Appears fatigued.  No pain complaints.  Family member at bedside ? ?Objective: ?Vitals:  ? 01/12/22 1525 01/12/22 2005 01/13/22 0523 01/13/22 0801  ?BP: 116/71 (!) 138/55 (!) 110/55 Marland Kitchen)  145/66  ?Pulse: 67 67 74 70  ?Resp: 16 16 16 18   ?Temp: 99 ?F (37.2 ?C) 99 ?F (37.2 ?C) 99.1 ?F (37.3 ?C) 98.6 ?F (37 ?C)  ?TempSrc: Oral   Oral  ?SpO2: 100% 98% 99% 100%  ?Weight:      ?Height:      ? ? ?Intake/Output Summary (Last 24 hours) at 01/13/2022 1015 ?Last data filed at 01/12/2022 1903 ?Gross per 24 hour  ?Intake 360 ml  ?Output --  ?Net 360 ml  ? ?Filed Weights  ? 01/07/22 1808 01/11/22 0848 01/11/22 1155  ?Weight: 74.6 kg 71.4 kg 70.8 kg  ? ? ?Examination: ? ?General exam: No acute distress.  Appears fatigued ?Respiratory system: Clear to auscultation. Respiratory effort normal. ?Cardiovascular system: S1-S2, RRR, no murmurs, no pedal edema ?Gastrointestinal system: Soft, NT/ND, normal bowel sounds ?Central nervous system: Alert and oriented. No focal neurological deficits. ?Extremities: Symmetric 5 x 5 power. ?Skin: No rashes, lesions or ulcers ?Psychiatry: Judgement and insight appear normal. Mood & affect appropriate.  ? ? ? ?Data Reviewed: I have personally reviewed following labs and imaging studies ? ?CBC: ?Recent Labs  ?Lab 01/07/22 ?7035 01/10/22 ?0450  ?WBC 8.0 10.8*  ?HGB 8.7* 8.5*  ?HCT 26.3* 25.7*  ?MCV 97.0 98.1  ?PLT 240 236  ? ?Basic Metabolic Panel: ?Recent Labs  ?Lab 01/09/22 ?0093 01/10/22 ?1022 01/11/22 ?8182 01/12/22 ?9937 01/13/22 ?0432  ?NA 140 138 137 137 136  ?K 3.5 4.2 3.8 3.9 3.7  ?CL 100 98 100 96* 97*  ?CO2 31 29 28 30 31   ?GLUCOSE 85 105* 113* 83 90  ?BUN 23* 27* 28* 16 24*  ?CREATININE 5.76* 6.37* 6.27* 4.36* 5.60*  ?CALCIUM 8.4* 9.0 8.8* 8.9 8.4*  ? ?GFR: ?Estimated Creatinine Clearance: 14 mL/min (A) (by C-G formula based on SCr of 5.6 mg/dL (H)). ?Liver Function Tests: ?No results for input(s): AST, ALT, ALKPHOS, BILITOT, PROT, ALBUMIN in the last 168 hours. ?No results for input(s): LIPASE, AMYLASE in the last 168 hours. ?No results for input(s): AMMONIA in the last 168 hours. ?Coagulation Profile: ?No results for input(s): INR, PROTIME in the last 168 hours. ?Cardiac  Enzymes: ?No results for input(s): CKTOTAL, CKMB, CKMBINDEX, TROPONINI in the last 168 hours. ?BNP (last 3 results) ?No results for input(s): PROBNP in the last 8760 hours. ?HbA1C: ?No results for input(s): HGBA1C in the last 72 hours. ?CBG: ?No results for input(s): GLUCAP in the last 168 hours. ?Lipid Profile: ?No results for input(s): CHOL, HDL, LDLCALC, TRIG, CHOLHDL, LDLDIRECT in the last 72 hours. ?Thyroid Function Tests: ?No results for input(s): TSH, T4TOTAL, FREET4, T3FREE, THYROIDAB in the last 72 hours. ?Anemia Panel: ?No results for input(s): VITAMINB12, FOLATE, FERRITIN, TIBC, IRON, RETICCTPCT in the last 72 hours. ?Sepsis Labs: ?No results for input(s): PROCALCITON, LATICACIDVEN in the last 168 hours. ? ?No results found for this or any previous visit (from the past 240 hour(s)).  ? ? ? ? ? ?Radiology Studies: ?No results found. ? ? ? ? ? ?Scheduled Meds: ? ARIPiprazole  1 mg Oral Daily  ? aspirin EC  81 mg Oral Daily  ? carvedilol  12.5 mg Oral BID WC  ? Chlorhexidine Gluconate Cloth  6 each Topical Q0600  ? diclofenac Sodium  2 g Topical QID  ? feeding supplement (NEPRO CARB STEADY)  237 mL Oral TID BM  ? heparin injection (subcutaneous)  5,000 Units Subcutaneous Q8H  ? multivitamin  1 tablet Oral QHS  ? pantoprazole  40 mg Oral BID  ?  polyethylene glycol  34 g Oral Daily  ? rosuvastatin  10 mg Oral Daily  ? senna  1 tablet Oral QHS  ? sertraline  100 mg Oral Daily  ? sodium bicarbonate  650 mg Oral TID  ? tamsulosin  0.4 mg Oral Daily  ? ?Continuous Infusions: ? ? LOS: 20 days  ? ? ? ? ?Sidney Ace, MD ?[facility] ? ? ?If 7PM-7AM, please contact night-coverage ? ?01/13/2022, 10:15 AM  ? ?

## 2022-01-13 NOTE — Progress Notes (Signed)
Physical Therapy Treatment ?Patient Details ?Name: Randall [name] ?MRN: [id number] ?DOB: 05/11/1961 ?Today's Date: 01/13/2022 ? ? ?History of Present Illness Randall [name] is a 61 y.o. male seen for evaluation of recurrent vomiting at the request of Dr. Judd Gaudier. Patient has a PMH of dementia, hypertension, diabetes, chronic kidney disease, CHF.  Patient presented to the [facility] ED for chief complaint of nausea, vomiting and weakness. Upon presentation to the ED yesterday, vital signs were stable with blood pressure 120/72, heart rate 67, respirations 18, temperature 98.7, pulse oximetry 97%. Labs were significant for new AKI with Cr 3.5, normal electrolytes GFR 19, glucose 176, WBC 17.9, UA unremarkable.  Imaging studies revealed CT of the abdomen pelvis revealed a possible tiny hiatal hernia, constipation, otherwise no acute intra-abdominal or intrapelvic abnormality.  Aortic atherosclerosis.  He is being admitted for IV hydration.  GI is consulted for evaluation and management of recurrent vomiting. ? ?  ?PT Comments  ? ? Patient tolerated session well and was agreeable to treatment. Upon entry into room patient was seated in bed (bed in chair position) eating lunch. No pain, dizziness, or lightheadedness reported throughout session. Patient continues to be Mod I with all bed mobility and sit to stand transfers. Patient was able to ambulate 2 laps around the nurses station at [facility] with no LOB noted, and ascend/descend 12 steps. Patient required UUE support on R handrail for stability when completing steps. Patient descends steps in a step to pattern, however ascended steps with a step through pattern. Patient is progressing towards his goals and would continue to benefit from skilled physical therapy in order to optimize patient's return to PLOF. Continue to recommend HHPT upon discharge from acute hospitalization.  ?  ?Recommendations for follow up therapy are one component of a multi-disciplinary  discharge planning process, led by the attending physician.  Recommendations may be updated based on patient status, additional functional criteria and insurance authorization. ? ?Follow Up Recommendations ? Home health PT ?  ?  ?Assistance Recommended at Discharge Intermittent Supervision/Assistance  ?Patient can return home with the following A little help with walking and/or transfers;A little help with bathing/dressing/bathroom;Help with stairs or ramp for entrance;Assist for transportation;Direct supervision/assist for financial management;Direct supervision/assist for medications management;Assistance with cooking/housework ?  ?Equipment Recommendations ? Rolling walker (2 wheels)  ?  ?Recommendations for Other Services   ? ? ?  ?Precautions / Restrictions Precautions ?Precautions: Fall ?Restrictions ?Weight Bearing Restrictions: No  ?  ? ?Mobility ? Bed Mobility ?Overal bed mobility: Modified Independent ?Bed Mobility: Supine to Sit, Sit to Supine ?  ?  ?Supine to sit: Modified independent (Device/Increase time), HOB elevated ?Sit to supine: Modified independent (Device/Increase time), HOB elevated ?  ?General bed mobility comments: no physical assist needed ?Patient Response: Cooperative, Flat affect ? ?Transfers ?Overall transfer level: Needs assistance ?Equipment used: None ?Transfers: Sit to/from Stand ?Sit to Stand: Modified independent (Device/Increase time) ?  ?  ?  ?  ?  ?  ?  ? ?Ambulation/Gait ?Ambulation/Gait assistance:  (SBA) ?Gait Distance (Feet): 380 Feet ?Assistive device: None ?Gait Pattern/deviations: Decreased step length - right, Decreased step length - left, Decreased stride length ?Gait velocity: decreased ?  ?  ?General Gait Details: L inattention, no LOB noted, directional cueing required due to L inattention ? ? ?Stairs ?Stairs: Yes ?Stairs assistance:  (SBA) ?Stair Management: One rail Right ?Number of Stairs: 12 ?General stair comments: step-to to descend steps leading with RLE,  step through to ascend ? ? ?Wheelchair  Mobility ?  ? ?Modified Rankin (Stroke Patients Only) ?  ? ? ?  ?Balance Overall balance assessment: Needs assistance ?Sitting-balance support: Feet supported ?Sitting balance-Leahy Scale: Good ?  ?  ?Standing balance support: No upper extremity supported, During functional activity ?Standing balance-Leahy Scale: Fair ?Standing balance comment: SBA for dynamic standing tasks without use of AD ?  ?  ?  ?  ?  ?  ?  ?  ?  ?  ?  ?  ? ?  ?Cognition Arousal/Alertness: Lethargic ?Behavior During Therapy: Flat affect ?Overall Cognitive Status: History of cognitive impairments - at baseline ?Area of Impairment: Following commands, Safety/judgement, Problem solving ?  ?  ?  ?  ?  ?  ?  ?  ?  ?  ?  ?Following Commands: Follows one step commands consistently ?Safety/Judgement: Decreased awareness of safety ?  ?Problem Solving: Slow processing, Decreased initiation, Requires verbal cues ?  ?  ?  ? ?  ?Exercises   ? ?  ?General Comments General comments (skin integrity, edema, etc.): SpO2 remained >90% throughout session on room air HR: 77bpm at beginning of session, no lightheadedness or dizziness noted throughout session ?  ?  ? ?Pertinent Vitals/Pain Pain Assessment ?Pain Assessment: No/denies pain ?Pain Intervention(s): Monitored during session  ? ? ?Home Living   ?  ?  ?  ?  ?  ?  ?  ?  ?  ?   ?  ?Prior Function    ?  ?  ?   ? ?PT Goals (current goals can now be found in the care plan section) Acute Rehab PT Goals ?Patient Stated Goal: to return home ?PT Goal Formulation: With patient/family ?Time For Goal Achievement: 01/20/22 ?Potential to Achieve Goals: Good ?Progress towards PT goals: Progressing toward goals ? ?  ?Frequency ? ? ? Min 2X/week ? ? ? ?  ?PT Plan Current plan remains appropriate  ? ? ?Co-evaluation   ?  ?  ?  ?  ? ?  ?AM-PAC PT "6 Clicks" Mobility   ?Outcome Measure ? Help needed turning from your back to your side while in a flat bed without using bedrails?:  None ?Help needed moving from lying on your back to sitting on the side of a flat bed without using bedrails?: None ?Help needed moving to and from a bed to a chair (including a wheelchair)?: A Little ?Help needed standing up from a chair using your arms (e.g., wheelchair or bedside chair)?: A Little ?Help needed to walk in hospital room?: A Little ?Help needed climbing 3-5 steps with a railing? : A Little ?6 Click Score: 20 ? ?  ?End of Session Equipment Utilized During Treatment: Gait belt ?Activity Tolerance: Patient tolerated treatment well;No increased pain ?Patient left: in bed;with call bell/phone within reach;with bed alarm set ?Nurse Communication: Mobility status ?PT Visit Diagnosis: Unsteadiness on feet (R26.81);Muscle weakness (generalized) (M62.81);Difficulty in walking, not elsewhere classified (R26.2) ?  ? ? ?Time: 0932-6712 ?PT Time Calculation (min) (ACUTE ONLY): 11 min ? ?Charges:  $Gait Training: 8-22 mins          ?          ? ?Iva Boop, PT  ?01/13/22. 2:48 PM ? ? ?

## 2022-01-13 NOTE — Progress Notes (Signed)
[facility]  ROUNDING NOTE   Subjective:  Pt is admitted to [facility] on 12/22/2021 with intractable vomiting on 12/22/2021 with intractable vomiting which has been going for weeks.  He noted to have mild leukocytosis and AKI on admission patient has a past medical history significant for dementia, hypertension, HFpEF, CKD 3a and CVA.  We were asked to see the patient for AKI with a significantly elevated BUN and creatinine  Patient sitting up in bed, breakfast tray at bedside Sister at bedside Improved mentation Reports lightly improved appetite Continues to void  Objective:  Vital signs in last 24 hours:  Temp:  [98.6 F (37 C)-99.1 F (37.3 C)] 98.6 F (37 C) (03/10 0801) Pulse Rate:  [67-74] 70 (03/10 0801) Resp:  [16-18] 18 (03/10 0801) BP: (110-145)/(55-71) 145/66 (03/10 0801) SpO2:  [98 %-100 %] 100 % (03/10 0801)  Weight change:  Filed Weights   01/07/22 1808 01/11/22 0848 01/11/22 1155  Weight: 74.6 kg 71.4 kg 70.8 kg    Intake/Output: I/O last 3 completed shifts: In: 360 [P.O.:360] Out: 0    Intake/Output this shift:  No intake/output data recorded.  Physical Exam: General: NAD  Head: Normocephalic, atraumatic. Moist oral mucosal membranes  Eyes: Anicteric  Lungs:  Clear to auscultation, normal effort  Heart: S1S2 no rubs  Abdomen:  Soft, nontender, bowel sounds present  Extremities:  No peripheral edema.  Neurologic: Alert following simple commands  Skin: No acute rash  Access: Rt IJ permcath placed by Dr Lucky Cowboy on 17/53/01    Basic Metabolic Panel: Recent Labs  Lab 01/09/22 0544 01/10/22 1022 01/11/22 0534 01/12/22 0519 01/13/22 0432  NA 140 138 137 137 136  K 3.5 4.2 3.8 3.9 3.7  CL 100 98 100 96* 97*  CO2 '31 29 28 30 31  ' GLUCOSE 85 105* 113* 83 90  BUN 23* 27* 28* 16 24*  CREATININE 5.76* 6.37* 6.27* 4.36* 5.60*  CALCIUM 8.4* 9.0 8.8* 8.9 8.4*     Liver Function Tests: No results for input(s): AST, ALT, ALKPHOS, BILITOT, PROT,  ALBUMIN in the last 168 hours.  No results for input(s): LIPASE, AMYLASE in the last 168 hours.  No results for input(s): AMMONIA in the last 168 hours.  CBC: Recent Labs  Lab 01/07/22 0449 01/10/22 0450  WBC 8.0 10.8*  HGB 8.7* 8.5*  HCT 26.3* 25.7*  MCV 97.0 98.1  PLT 240 236     Cardiac Enzymes: No results for input(s): CKTOTAL, CKMB, CKMBINDEX, TROPONINI in the last 168 hours.  BNP: Invalid input(s): POCBNP  CBG: No results for input(s): GLUCAP in the last 168 hours.   Microbiology: Results for orders placed or performed during the hospital encounter of 12/22/21  Resp Panel by RT-PCR (Flu A&B, Covid) Nasopharyngeal Swab     Status: None   Collection Time: 12/22/21 11:18 PM   Specimen: Nasopharyngeal Swab; Nasopharyngeal(NP) swabs in vial transport medium  Result Value Ref Range Status   SARS Coronavirus 2 by RT PCR NEGATIVE NEGATIVE Final    Comment: (NOTE) SARS-CoV-2 target nucleic acids are NOT DETECTED.  The SARS-CoV-2 RNA is generally detectable in upper respiratory specimens during the acute phase of infection. The lowest concentration of SARS-CoV-2 viral copies this assay can detect is 138 copies/mL. A negative result does not preclude SARS-Cov-2 infection and should not be used as the sole basis for treatment or other patient management decisions. A negative result may occur with  improper specimen collection/handling, submission of specimen other than nasopharyngeal swab, presence of  viral mutation(s) within the areas targeted by this assay, and inadequate number of viral copies(<138 copies/mL). A negative result must be combined with clinical observations, patient history, and epidemiological information. The expected result is Negative.  Fact Sheet for Patients:  EntrepreneurPulse.com.au  Fact Sheet for Healthcare Providers:  IncredibleEmployment.be  This test is no t yet approved or cleared by the [place] FDA and  has been authorized for detection and/or diagnosis of SARS-CoV-2 by FDA under an Emergency Use Authorization (EUA). This EUA will remain  in effect (meaning this test can be used) for the duration of the COVID-19 declaration under Section 564(b)(1) of the Act, 21 U.S.C.section 360bbb-3(b)(1), unless the authorization is terminated  or revoked sooner.       Influenza A by PCR NEGATIVE NEGATIVE Final   Influenza B by PCR NEGATIVE NEGATIVE Final    Comment: (NOTE) The Xpert Xpress SARS-CoV-2/FLU/RSV plus assay is intended as an aid in the diagnosis of influenza from Nasopharyngeal swab specimens and should not be used as a sole basis for treatment. Nasal washings and aspirates are unacceptable for Xpert Xpress SARS-CoV-2/FLU/RSV testing.  Fact Sheet for Patients: EntrepreneurPulse.com.au  Fact Sheet for Healthcare Providers: IncredibleEmployment.be  This test is not yet approved or cleared by the [place] FDA and has been authorized for detection and/or diagnosis of SARS-CoV-2 by FDA under an Emergency Use Authorization (EUA). This EUA will remain in effect (meaning this test can be used) for the duration of the COVID-19 declaration under Section 564(b)(1) of the Act, 21 U.S.C. section 360bbb-3(b)(1), unless the authorization is terminated or revoked.  Performed at [facility], [address] [place], [place] [address]     Coagulation Studies: No results for input(s): LABPROT, INR in the last 72 hours.  Urinalysis: No results for input(s): COLORURINE, LABSPEC, PHURINE, GLUCOSEU, HGBUR, BILIRUBINUR, KETONESUR, PROTEINUR, UROBILINOGEN, NITRITE, LEUKOCYTESUR in the last 72 hours.  Invalid input(s): APPERANCEUR     Imaging: No results found.   Medications:      ARIPiprazole  1 mg Oral Daily   aspirin EC  81 mg Oral Daily   carvedilol  12.5 mg Oral BID WC   Chlorhexidine Gluconate Cloth  6 each  Topical Q0600   diclofenac Sodium  2 g Topical QID   feeding supplement (NEPRO CARB STEADY)  237 mL Oral TID BM   heparin injection (subcutaneous)  5,000 Units Subcutaneous Q8H   multivitamin  1 tablet Oral QHS   pantoprazole  40 mg Oral BID   polyethylene glycol  34 g Oral Daily   rosuvastatin  10 mg Oral Daily   senna  1 tablet Oral QHS   sertraline  100 mg Oral Daily   sodium bicarbonate  650 mg Oral TID   tamsulosin  0.4 mg Oral Daily   acetaminophen **OR** acetaminophen, hydrALAZINE, ondansetron **OR** ondansetron (ZOFRAN) IV, ondansetron (ZOFRAN) IV, sorbitol, milk of mag, mineral oil, glycerin (SMOG) enema  Assessment/ Plan:  61 y.o. male with a past medical history significant for dementia, hypertension, heart failure, CKD 3a and CVA admitted to [facility] on 12/22/2021 with intractable vomiting.  Also noted to have mild leukocytosis and AKI.   #1 AKI on CKD 3a with baseline creatinine of 1.4 and EGFR of 52.    It is felt that acute kidney injury could have been contributed from usage of Advil/NSAIDs and from dehydration secondary to intractable vomiting.   Patient creatinine remained high at around 8. Patient will require renal replacement therapy for now as patient  creatinine since his last dialysis has increased steadily from 5. To -6.3 to 7.5 to around 7.7-- 8. Creatinine continues to rise slowly between dialysis treatments.  Mentation improves for 1 to 2 days after treatment.  Was initially considering twice weekly dialysis but due to the mentation may consider 3 days a week.  Dialysis coordinator currently pursuing chair time at [facility].  Awaiting clinic evaluation and acceptance.  #2 recurrent vomiting likely multifactorial including advancing dementia ,medication side effect or functional dyspepsia.  GI is consulted.  EGD completed with no signs of active bleeding.  Did see gastritis, small hiatal hernia and LA grade B reflux esophagitis.  Concerning areas  biopsied.  Resolved    3. Anemia of chronic kidney disease  Normocytic Lab Results  Component Value Date   HGB 8.5 (L) 01/10/2022  Hemoglobin remains steady from 8.5-8.7 We will obtain updated CBC in a.m.  4.   Hypertension, well controlled.  Currently receiving amlodipine, carvedilol, hydralazine, and tamsulosin.   BP has fluctuated over the last 24 hours.  Continue current treatments   LOS: [place] 3/10/202312:48 PM

## 2022-01-13 NOTE — TOC Progression Note (Signed)
Transition of Care (TOC) - Progression Note  ? ? ?Patient Details  ?Name: Randall [name] ?MRN: [id number] ?Date of Birth: 04-09-1961 ? ?Transition of Care (TOC) CM/SW Contact  ?Beverly Sessions, RN ?Phone Number: ?01/13/2022, 10:48 AM ? ?Clinical Narrative:    ? ?When determined by HD coordinator/nephrology if patient has confirmed outpatient HD arranged ?Plan remains for patient to discharge home with home health services through [place].  DME was already previously delivered to patient's room ? ?  ?  ? ?Expected Discharge Plan and Services ?  ?  ?  ?  ?  ?                ?DME Arranged: 3-N-1, Walker rolling ?DME Agency: [employer] ?Date DME Agency Contacted: 12/24/21 ?Time DME Agency Contacted: [phone] ?Representative spoke with at DME Agency: Brenton Grills ?  ?  ?  ?  ?  ? ? ?Social Determinants of Health (SDOH) Interventions ?  ? ?Readmission Risk Interventions ?No flowsheet data found. ? ?

## 2022-01-14 LAB — BASIC METABOLIC PANEL
Anion gap: 8 (ref 5–15)
BUN: 25 mg/dL — ABNORMAL HIGH (ref 6–20)
CO2: 30 mmol/L (ref 22–32)
Calcium: 8.5 mg/dL — ABNORMAL LOW (ref 8.9–10.3)
Chloride: 98 mmol/L (ref 98–111)
Creatinine, Ser: 5.65 mg/dL — ABNORMAL HIGH (ref 0.61–1.24)
GFR, Estimated: 11 mL/min — ABNORMAL LOW (ref >60)
Glucose, Bld: 92 mg/dL (ref 70–99)
Potassium: 3.8 mmol/L (ref 3.5–5.1)
Sodium: 136 mmol/L (ref 135–145)

## 2022-01-14 LAB — CBC
HCT: 24.1 % — ABNORMAL LOW (ref 39.0–52.0)
Hemoglobin: 7.9 g/dL — ABNORMAL LOW (ref 13.0–17.0)
MCH: 32.1 pg (ref 26.0–34.0)
MCHC: 32.8 g/dL (ref 30.0–36.0)
MCV: 98 fL (ref 80.0–100.0)
Platelets: 237 10*3/uL (ref 150–400)
RBC: 2.46 MIL/uL — ABNORMAL LOW (ref 4.22–5.81)
RDW: 12.1 % (ref 11.5–15.5)
WBC: 8.2 10*3/uL (ref 4.0–10.5)
nRBC: 0 % (ref 0.0–0.2)

## 2022-01-14 MED ORDER — HEPARIN SODIUM (PORCINE) 1000 UNIT/ML IJ SOLN
INTRAMUSCULAR | Status: AC
  Filled 2022-01-14: qty 10

## 2022-01-14 NOTE — Progress Notes (Addendum)
[facility]  ROUNDING NOTE   Subjective:  Pt is admitted to [facility] on 12/22/2021 with intractable vomiting on 12/22/2021 with intractable vomiting which has been going for weeks.  He noted to have mild leukocytosis and AKI on admission patient has a past medical history significant for dementia, hypertension, HFpEF, CKD 3a and CVA.  We were asked to see the patient for AKI with a significantly elevated BUN and creatinine  Patient seen during HD treatment   HEMODIALYSIS FLOWSHEET:  Blood Flow Rate (mL/min): 400 mL/min Arterial Pressure (mmHg): -170 mmHg Venous Pressure (mmHg): 170 mmHg Transmembrane Pressure (mmHg): 50 mmHg Ultrafiltration Rate (mL/min): 170 mL/min Dialysate Flow Rate (mL/min): 500 ml/min Conductivity: Machine : 13.6 Conductivity: Machine : 13.6 Dialysis Fluid Bolus: Normal Saline Bolus Amount (mL): 250 mL    Objective:  Vital signs in last 24 hours:  Temp:  [98 F (36.7 C)-98.9 F (37.2 C)] 98.1 F (36.7 C) (03/11 1301) Pulse Rate:  [63-75] 64 (03/11 1301) Resp:  [7-18] 18 (03/11 1301) BP: (117-183)/(62-89) 173/80 (03/11 1301) SpO2:  [100 %] 100 % (03/11 1301) Weight:  [69.3 kg-71.2 kg] 69.3 kg (03/11 1202)  Weight change:  Filed Weights   01/11/22 1155 01/14/22 0902 01/14/22 1202  Weight: 70.8 kg 71.2 kg 69.3 kg    Intake/Output: I/O last 3 completed shifts: In: 0  Out: 700 [Urine:700]   Intake/Output this shift:  No intake/output data recorded.  Physical Exam: General: Lying in bed, receiving HD, in no acute distress  Head: Moist oral mucosal membranes  Eyes: Anicteric  Lungs:  Respirations symmetrical ,unlabored, lungs clear to auscultation  Heart: S1S2 no rubs  Abdomen:  Soft, nontender  Extremities:  No peripheral edema.  Neurologic: Awake,alert, able to answer simple questions  Skin: No acute rashes or lesions  Access: Rt IJ permcath placed by Dr Lucky Cowboy on 16/43/53    Basic Metabolic Panel: Recent Labs  Lab 01/10/22 1022  01/11/22 0534 01/12/22 0519 01/13/22 0432 01/14/22 0542  NA 138 137 137 136 136  K 4.2 3.8 3.9 3.7 3.8  CL 98 100 96* 97* 98  CO2 '29 28 30 31 30  ' GLUCOSE 105* 113* 83 90 92  BUN 27* 28* 16 24* 25*  CREATININE 6.37* 6.27* 4.36* 5.60* 5.65*  CALCIUM 9.0 8.8* 8.9 8.4* 8.5*     Liver Function Tests: No results for input(s): AST, ALT, ALKPHOS, BILITOT, PROT, ALBUMIN in the last 168 hours.  No results for input(s): LIPASE, AMYLASE in the last 168 hours.  No results for input(s): AMMONIA in the last 168 hours.  CBC: Recent Labs  Lab 01/10/22 0450 01/14/22 0542  WBC 10.8* 8.2  HGB 8.5* 7.9*  HCT 25.7* 24.1*  MCV 98.1 98.0  PLT 236 237     Cardiac Enzymes: No results for input(s): CKTOTAL, CKMB, CKMBINDEX, TROPONINI in the last 168 hours.  BNP: Invalid input(s): POCBNP  CBG: No results for input(s): GLUCAP in the last 168 hours.   Microbiology: Results for orders placed or performed during the hospital encounter of 12/22/21  Resp Panel by RT-PCR (Flu A&B, Covid) Nasopharyngeal Swab     Status: None   Collection Time: 12/22/21 11:18 PM   Specimen: Nasopharyngeal Swab; Nasopharyngeal(NP) swabs in vial transport medium  Result Value Ref Range Status   SARS Coronavirus 2 by RT PCR NEGATIVE NEGATIVE Final    Comment: (NOTE) SARS-CoV-2 target nucleic acids are NOT DETECTED.  The SARS-CoV-2 RNA is generally detectable in upper respiratory specimens during the acute phase of  infection. The lowest concentration of SARS-CoV-2 viral copies this assay can detect is 138 copies/mL. A negative result does not preclude SARS-Cov-2 infection and should not be used as the sole basis for treatment or other patient management decisions. A negative result may occur with  improper specimen collection/handling, submission of specimen other than nasopharyngeal swab, presence of viral mutation(s) within the areas targeted by this assay, and inadequate number of viral copies(<138  copies/mL). A negative result must be combined with clinical observations, patient history, and epidemiological information. The expected result is Negative.  Fact Sheet for Patients:  EntrepreneurPulse.com.au  Fact Sheet for Healthcare Providers:  IncredibleEmployment.be  This test is no t yet approved or cleared by the [place] FDA and  has been authorized for detection and/or diagnosis of SARS-CoV-2 by FDA under an Emergency Use Authorization (EUA). This EUA will remain  in effect (meaning this test can be used) for the duration of the COVID-19 declaration under Section 564(b)(1) of the Act, 21 U.S.C.section 360bbb-3(b)(1), unless the authorization is terminated  or revoked sooner.       Influenza A by PCR NEGATIVE NEGATIVE Final   Influenza B by PCR NEGATIVE NEGATIVE Final    Comment: (NOTE) The Xpert Xpress SARS-CoV-2/FLU/RSV plus assay is intended as an aid in the diagnosis of influenza from Nasopharyngeal swab specimens and should not be used as a sole basis for treatment. Nasal washings and aspirates are unacceptable for Xpert Xpress SARS-CoV-2/FLU/RSV testing.  Fact Sheet for Patients: EntrepreneurPulse.com.au  Fact Sheet for Healthcare Providers: IncredibleEmployment.be  This test is not yet approved or cleared by the [place] FDA and has been authorized for detection and/or diagnosis of SARS-CoV-2 by FDA under an Emergency Use Authorization (EUA). This EUA will remain in effect (meaning this test can be used) for the duration of the COVID-19 declaration under Section 564(b)(1) of the Act, 21 U.S.C. section 360bbb-3(b)(1), unless the authorization is terminated or revoked.  Performed at [facility], [address] [place], [place] [address]     Coagulation Studies: No results for input(s): LABPROT, INR in the last 72 hours.  Urinalysis: No results for input(s):  COLORURINE, LABSPEC, PHURINE, GLUCOSEU, HGBUR, BILIRUBINUR, KETONESUR, PROTEINUR, UROBILINOGEN, NITRITE, LEUKOCYTESUR in the last 72 hours.  Invalid input(s): APPERANCEUR     Imaging: No results found.   Medications:      ARIPiprazole  1 mg Oral Daily   aspirin EC  81 mg Oral Daily   carvedilol  12.5 mg Oral BID WC   Chlorhexidine Gluconate Cloth  6 each Topical Q0600   diclofenac Sodium  2 g Topical QID   feeding supplement (NEPRO CARB STEADY)  237 mL Oral TID BM   heparin injection (subcutaneous)  5,000 Units Subcutaneous Q8H   heparin sodium (porcine)       multivitamin  1 tablet Oral QHS   pantoprazole  40 mg Oral BID   polyethylene glycol  34 g Oral Daily   rosuvastatin  10 mg Oral Daily   senna  1 tablet Oral QHS   sertraline  100 mg Oral Daily   sodium bicarbonate  650 mg Oral TID   tamsulosin  0.4 mg Oral Daily   acetaminophen **OR** acetaminophen, hydrALAZINE, ondansetron **OR** ondansetron (ZOFRAN) IV, ondansetron (ZOFRAN) IV, sorbitol, milk of mag, mineral oil, glycerin (SMOG) enema  Assessment/ Plan:  61 y.o. male with a past medical history significant for dementia, hypertension, heart failure, CKD 3a and CVA admitted to [facility] on 12/22/2021 with intractable vomiting.  Also  noted to have mild leukocytosis and AKI.   #1 AKI on CKD 3a with baseline creatinine of 1.4 and EGFR of 52.    It is felt that acute kidney injury could have been contributed from usage of Advil/NSAIDs and from dehydration secondary to intractable vomiting.   Patient creatinine remained high at around 8. Patient will require renal replacement therapy for now as patient creatinine since his last dialysis has increased steadily from 5. To -6.3 to 7.5 to around 7.7-- 8. Creatinine continues to rise slowly between dialysis treatments.  Mentation improves for 1 to 2 days after treatment.  Was initially considering twice weekly dialysis but due to the mentation may consider 3 days a week.  Dialysis  coordinator currently pursuing chair time at [facility].  Awaiting clinic evaluation and acceptance.  Lab Results  Component Value Date   CREATININE 5.65 (H) 01/14/2022   CREATININE 5.60 (H) 01/13/2022   CREATININE 4.36 (H) 01/12/2022    #2 Recurrent vomiting  Likely multifactorial including advancing dementia ,medication side effect or functional dyspepsia.  GI is consulted.  EGD completed with no signs of active bleeding.  Did see gastritis, small hiatal hernia and LA grade B reflux esophagitis.   On Pantoprazole 40 mg PO BID  3. Anemia of chronic kidney disease  Normocytic Lab Results  Component Value Date   HGB 7.9 (L) 01/14/2022  Will continue monitoring closely,may consider adding Epogen  with further HD treatments  4.   Hypertension, well controlled.  Currently receiving amlodipine, carvedilol, hydralazine, and tamsulosin.  Blood pressure today 158/74 mm of Hg   LOS: 21 Mackenzye Mackel 3/11/20231:37 PM

## 2022-01-14 NOTE — Progress Notes (Signed)
?PROGRESS NOTE ? ? ? ?Randall [name]  JQZ:[id number] DOB: 12/19/1960 DOA: 12/22/2021 ?PCP: Marlowe Aschoff, MD  ? ? ?Brief Narrative:  ?Patient initially admitted to [facility] on 2/16 with chief complaint of intractable nausea and vomiting progressing over the past several weeks.  Patient had significant AKI on admission.  History of HFpEF, CKD stage IIIa and CVA.  Nephrology involved.  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  Started on intermittent hemodialysis. ? ?Current.  Patient is medically stable however we are lacking a safe disposition plan.  Outpatient dialysis coordinator working on setting up HD chair time. ? ? ?Assessment & Plan: ?  ?Principal Problem: ?  Recurrent vomiting ?Active Problems: ?  Leukocytosis ?  Dementia ([place]) ?  Chronic systolic CHF (congestive heart failure) ([place]) ?  History of CVA (cerebrovascular accident) ?  Acute renal failure superimposed on stage 3a chronic kidney disease ([place]) ?  Moderate major depression ([place]) ?  Intractable nausea and vomiting ?  Essential hypertension ? ?Acute renal failure superimposed on stage 3a chronic kidney disease ([place]) ?Good urinary output though doesn't appear to be accurately recorded by nursing. ?Nephrology was consulted and they decided to proceed with hemodialysis.  Received 3 sessions of initial dialysis with temporary catheter which was then switched with permanent cath. GFR remains below 15 ?- next dialysis planned for 3/8, is getting dialyzed intermittently. Nephrology at work on outpatient chair ?- last dialysis was 3/8 ?- has right IJ tunneled dialysis catheter placed by vascular surgery on 2/24 ?Plan: ?Nephrology following for inpatient hemodialysis needs ?HD planned today 3/11 ?Discharge delayed by lack of safe disposition plan as the patient's insurance does not cover outpatient hemodialysis ? ?Recurrent vomiting- (present on admission) ?resolved. ?Recurrent episodes with second hospitalization in 2 weeks for the  same ?No definite etiology identified. ?GI was consulted and patient underwent EGD on 12/26/2020 which shows esophagitis, some esophageal dysmotility, gastritis and few nonbleeding erosions in duodenum, biopsies were taken, which were negative. Vomiting and nausea resolved, tolerating diet ?-Continue with Protonix ?-Okay for diet as tolerated ?  ?Anemia ?Hgb 8s from 11s on admission.  ?No hematochezia or melena or other bleeding.  ?Likely 2/2 new renal dysfunction ?- monitor  ?-Currently no indication for transfusion ?  ?Hypokalemia ?Resolved w/ supplementation ?  ?Essential hypertension- (present on admission) ?Blood pressure improved after adding hydralazine. ?-Hold Entresto as advised by nephrology while waiting for any renal function recovery. ?-Continue amlodipine  ?-Continue hydralazine, increase dose today ?-Continue to monitor ?  ?Moderate major depression ([place]) ?- Continue Abilify and Zoloft ?  ? ?History of CVA (cerebrovascular accident) ?Continue aspirin and statin ?  ?Chronic diastolic CHF (congestive heart failure) ([place])- (present on admission) ?Currently euvolemic. Ef 50% on 2021 TTE w/ grade 1 dysfunction ?-Continue Coreg, Carvedilol  ?-holding entresto as above ?  ?Dementia ([place])- (present on admission) ?Continue Namenda and sertraline ?  ?Constipation ?Large BM 3/5 ?- cont miralax/senna ? ? ?DVT prophylaxis: SQ heparin ?Code Status: Full ?Family Communication: Son at bedside 3/8, sister at bedside 3/9, 3/10 ?Disposition Plan: Status is: Inpatient ?Remains inpatient appropriate because: Unsafe discharge plan.  Medically stable ? ? ?Level of care: Med-Surg ? ?Consultants:  ?Nephrology ? ?Procedures:  ?Tunneled dialysis catheter placement ? ?Antimicrobials: ?None ? ? ?Subjective: ?Seen and examined.  Resting in bed.  Appears fatigued.  No pain complaints.  No family at bedside this morning ? ?Objective: ?Vitals:  ? 01/14/22 1030 01/14/22 1045 01/14/22 1100 01/14/22 1115  ?BP: (!) 178/81 (!) 163/82  140/78 (!) 157/74  ?Pulse: 65 66 69 66  ?Resp: 17 18 11  (!) 7  ?Temp:      ?TempSrc:      ?SpO2: 100% 100% 100% 100%  ?Weight:      ?Height:      ? ? ?Intake/Output Summary (Last 24 hours) at 01/14/2022 1135 ?Last data filed at 01/14/2022 1610 ?Gross per 24 hour  ?Intake 0 ml  ?Output 700 ml  ?Net -700 ml  ? ?Filed Weights  ? 01/11/22 0848 01/11/22 1155 01/14/22 0902  ?Weight: 71.4 kg 70.8 kg 71.2 kg  ? ? ?Examination: ? ?General exam: No acute distress.  Appears fatigued ?Respiratory system: Clear to auscultation. Respiratory effort normal. ?Cardiovascular system: S1-S2, RRR, no murmurs, no pedal edema ?Gastrointestinal system: Soft, NT/ND, normal bowel sounds ?Central nervous system: Alert and oriented. No focal neurological deficits. ?Extremities: Symmetric 5 x 5 power. ?Skin: No rashes, lesions or ulcers ?Psychiatry: Judgement and insight appear normal. Mood & affect appropriate.  ? ? ? ?Data Reviewed: I have personally reviewed following labs and imaging studies ? ?CBC: ?Recent Labs  ?Lab 01/10/22 ?0450 01/14/22 ?0542  ?WBC 10.8* 8.2  ?HGB 8.5* 7.9*  ?HCT 25.7* 24.1*  ?MCV 98.1 98.0  ?PLT 236 237  ? ?Basic Metabolic Panel: ?Recent Labs  ?Lab 01/10/22 ?1022 01/11/22 ?9604 01/12/22 ?5409 01/13/22 ?0432 01/14/22 ?0542  ?NA 138 137 137 136 136  ?K 4.2 3.8 3.9 3.7 3.8  ?CL 98 100 96* 97* 98  ?CO2 29 28 30 31 30   ?GLUCOSE 105* 113* 83 90 92  ?BUN 27* 28* 16 24* 25*  ?CREATININE 6.37* 6.27* 4.36* 5.60* 5.65*  ?CALCIUM 9.0 8.8* 8.9 8.4* 8.5*  ? ?GFR: ?Estimated Creatinine Clearance: 13.9 mL/min (A) (by C-G formula based on SCr of 5.65 mg/dL (H)). ?Liver Function Tests: ?No results for input(s): AST, ALT, ALKPHOS, BILITOT, PROT, ALBUMIN in the last 168 hours. ?No results for input(s): LIPASE, AMYLASE in the last 168 hours. ?No results for input(s): AMMONIA in the last 168 hours. ?Coagulation Profile: ?No results for input(s): INR, PROTIME in the last 168 hours. ?Cardiac Enzymes: ?No results for input(s): CKTOTAL, CKMB,  CKMBINDEX, TROPONINI in the last 168 hours. ?BNP (last 3 results) ?No results for input(s): PROBNP in the last 8760 hours. ?HbA1C: ?No results for input(s): HGBA1C in the last 72 hours. ?CBG: ?No results for input(s): GLUCAP in the last 168 hours. ?Lipid Profile: ?No results for input(s): CHOL, HDL, LDLCALC, TRIG, CHOLHDL, LDLDIRECT in the last 72 hours. ?Thyroid Function Tests: ?No results for input(s): TSH, T4TOTAL, FREET4, T3FREE, THYROIDAB in the last 72 hours. ?Anemia Panel: ?No results for input(s): VITAMINB12, FOLATE, FERRITIN, TIBC, IRON, RETICCTPCT in the last 72 hours. ?Sepsis Labs: ?No results for input(s): PROCALCITON, LATICACIDVEN in the last 168 hours. ? ?No results found for this or any previous visit (from the past 240 hour(s)).  ? ? ? ? ? ?Radiology Studies: ?No results found. ? ? ? ? ? ?Scheduled Meds: ? ARIPiprazole  1 mg Oral Daily  ? aspirin EC  81 mg Oral Daily  ? carvedilol  12.5 mg Oral BID WC  ? Chlorhexidine Gluconate Cloth  6 each Topical Q0600  ? diclofenac Sodium  2 g Topical QID  ? feeding supplement (NEPRO CARB STEADY)  237 mL Oral TID BM  ? heparin injection (subcutaneous)  5,000 Units Subcutaneous Q8H  ? heparin sodium (porcine)      ? multivitamin  1 tablet Oral QHS  ? pantoprazole  40 mg Oral BID  ?  polyethylene glycol  34 g Oral Daily  ? rosuvastatin  10 mg Oral Daily  ? senna  1 tablet Oral QHS  ? sertraline  100 mg Oral Daily  ? sodium bicarbonate  650 mg Oral TID  ? tamsulosin  0.4 mg Oral Daily  ? ?Continuous Infusions: ? ? LOS: 21 days  ? ? ? ? ?Sidney Ace, MD ?[facility] ? ? ?If 7PM-7AM, please contact night-coverage ? ?01/14/2022, 11:35 AM  ? ?

## 2022-01-14 NOTE — Progress Notes (Signed)
Mobility Specialist - Progress Note ? ? 01/14/22 1500  ?Mobility  ?Activity Refused mobility  ? ? ? ?Pt supine upon arrival using RA. Pt declines session despite max encouragement d/t being fatigued from HD. Will attempt at another date and time. ? ?Merrily Brittle ?Mobility Specialist ?01/14/22, 3:01 PM ? ? ? ? ?

## 2022-01-14 NOTE — Progress Notes (Signed)
Hemodialysis Post Treatment Note ? ?Tx. Date:  January 14, 2022 ? ?Tx Time: 3 hours ? ?Access: RIJ Catheter  ? ?UF Removed: 20ml * Urinary Output 319ml ? ?Note: ?Patient completes scheduled 3-hour hemodialysis treatment without incident. Vital signs remained stable throughout treatment. Catheter without sign/symptoms of infection, blood flow rate maintained at prescribed rate. Patient requested urinal during treatment, voided 300 cc. Patient able to stand for weights with a steady gait. Patient voiced no concerns, returned to assigned room.   ?

## 2022-01-14 NOTE — Plan of Care (Signed)
  Problem: Activity: Goal: Risk for activity intolerance will decrease Outcome: Progressing   Problem: Nutrition: Goal: Adequate nutrition will be maintained Outcome: Progressing   Problem: Safety: Goal: Ability to remain free from injury will improve Outcome: Progressing   

## 2022-01-14 NOTE — Progress Notes (Signed)
PT Cancellation Note ? ?Patient Details ?Name: Randall [name] ?MRN: [id number] ?DOB: 1961-01-05 ? ? ?Cancelled Treatment:    Reason Eval/Treat Not Completed: Patient at procedure or test/unavailable Due to patient being off the floor at HD, will hold on PT at this time, and will re-attempt at a later time/date as available and patient medically appropriate for PT. Thank you! ? ? ?Iva Boop, PT  ?01/14/22. 9:48 AM ? ?

## 2022-01-15 LAB — BASIC METABOLIC PANEL
Anion gap: 6 (ref 5–15)
BUN: 14 mg/dL (ref 6–20)
CO2: 33 mmol/L — ABNORMAL HIGH (ref 22–32)
Calcium: 8.4 mg/dL — ABNORMAL LOW (ref 8.9–10.3)
Chloride: 95 mmol/L — ABNORMAL LOW (ref 98–111)
Creatinine, Ser: 3.78 mg/dL — ABNORMAL HIGH (ref 0.61–1.24)
GFR, Estimated: 17 mL/min — ABNORMAL LOW (ref >60)
Glucose, Bld: 107 mg/dL — ABNORMAL HIGH (ref 70–99)
Potassium: 3.6 mmol/L (ref 3.5–5.1)
Sodium: 134 mmol/L — ABNORMAL LOW (ref 135–145)

## 2022-01-15 NOTE — Progress Notes (Signed)
Mobility Specialist - Progress Note ? ? 01/15/22 1232  ?Mobility  ?Activity Stood at bedside;Dangled on edge of bed  ?Range of Motion/Exercises Right leg;Left leg  ?Level of Assistance Standby assist, set-up cues, supervision of patient - no hands on  ?Assistive Device Front wheel walker  ?Distance Ambulated (ft) 0 ft  ?Activity Response Tolerated well  ?$Mobility charge 1 Mobility  ? ? ?During mobility: 76 HR, BP, 98% SpO2 ? ?Pt sleeping upon arrival using RA. Pt more confused this date and less responsive than usual. Agreeable after encouragement. Sits EOB with HHA and completes STS with supervision ---- BLE weakness and LOB noted. Completed 4 more STS reps but too lethargic to ambulate. Returned to bed with alarm set and needs in reach. ? ?Merrily Brittle ?Mobility Specialist ?01/15/22, 12:37 PM ? ? ? ? ?

## 2022-01-15 NOTE — Progress Notes (Signed)
Physical Therapy Treatment ?Patient Details ?Name: Randall [name] ?MRN: [id number] ?DOB: Jul 21, 1961 ?Today's Date: 01/15/2022 ? ? ?History of Present Illness Randall [name] is a 61 y.o. male seen for evaluation of recurrent vomiting at the request of Dr. Judd Gaudier. Patient has a PMH of dementia, hypertension, diabetes, chronic kidney disease, CHF.  Patient presented to the [facility] ED for chief complaint of nausea, vomiting and weakness. Upon presentation to the ED yesterday, vital signs were stable with blood pressure 120/72, heart rate 67, respirations 18, temperature 98.7, pulse oximetry 97%. Labs were significant for new AKI with Cr 3.5, normal electrolytes GFR 19, glucose 176, WBC 17.9, UA unremarkable.  Imaging studies revealed CT of the abdomen pelvis revealed a possible tiny hiatal hernia, constipation, otherwise no acute intra-abdominal or intrapelvic abnormality.  Aortic atherosclerosis.  He is being admitted for IV hydration.  GI is consulted for evaluation and management of recurrent vomiting. ? ?  ?PT Comments  ? ? Patient tolerated session well and was agreeable to treatment. Patient's BP taken at beginning of session in supine, and reported at 133/52mmHg. Patient continues to demonstrate Mod I with all bed mobility and sit to stands with no AD. Patient demonstrated good sitting and standing balance during pericare cleanup, ranging between UUE and BUE support from counter. Max A required for Pericare, however patient was able to demonstrate independence with donning pants. Patient ambulates 1.5 laps around the nurses station at [facility] with no AD and only mild unsteadiness. No LOB noted.  Patient is progressing towards his goals and would continue to benefit from skilled physical therapy in order to optimize patient's independence and ease with ADLs. Continue to recommend HHPT upon discharge from acute hospitalization.   ?Recommendations for follow up therapy are one component of a  multi-disciplinary discharge planning process, led by the attending physician.  Recommendations may be updated based on patient status, additional functional criteria and insurance authorization. ? ?Follow Up Recommendations ? Home health PT ?  ?  ?Assistance Recommended at Discharge Intermittent Supervision/Assistance  ?Patient can return home with the following A little help with walking and/or transfers;A little help with bathing/dressing/bathroom;Help with stairs or ramp for entrance;Assist for transportation;Direct supervision/assist for financial management;Direct supervision/assist for medications management;Assistance with cooking/housework ?  ?Equipment Recommendations ? Rolling walker (2 wheels)  ?  ?Recommendations for Other Services   ? ? ?  ?Precautions / Restrictions Precautions ?Precautions: Fall ?Restrictions ?Weight Bearing Restrictions: No  ?  ? ?Mobility ? Bed Mobility ?Overal bed mobility: Modified Independent ?Bed Mobility: Supine to Sit, Sit to Supine ?  ?  ?Supine to sit: Modified independent (Device/Increase time), HOB elevated ?Sit to supine: Modified independent (Device/Increase time), HOB elevated ?  ?General bed mobility comments: no physical assist needed ?Patient Response: Cooperative, Flat affect ? ?Transfers ?Overall transfer level: Needs assistance ?Equipment used: None ?Transfers: Sit to/from Stand ?Sit to Stand: Modified independent (Device/Increase time) ?  ?  ?  ?  ?  ?General transfer comment: x4 additional sit to stands completed with pericare clean up ?  ? ?Ambulation/Gait ?Ambulation/Gait assistance: Supervision (SBA),CGA ?Gait Distance (Feet): 280 Feet ?Assistive device: None ?Gait Pattern/deviations: Decreased step length - right, Decreased step length - left, Decreased stride length ?Gait velocity: decreased ?  ?  ?General Gait Details: L inattention, no LOB noted, mild unsteadiness during ambulation ? ? ?Stairs ?  ?  ?  ?  ?  ? ? ?Wheelchair Mobility ?  ? ?Modified Rankin  (Stroke Patients Only) ?  ? ? ?  ?  Balance Overall balance assessment: Needs assistance ?Sitting-balance support: Feet supported ?Sitting balance-Leahy Scale: Good ?Sitting balance - Comments: good static and dynamic balance during ther activity, able to demonstrate independence with donning pants in sitting ?  ?Standing balance support: No upper extremity supported, During functional activity ?Standing balance-Leahy Scale: Fair ?Standing balance comment: SBA with mild unsteadiness during ambulation ?  ?  ?  ?  ?  ?  ?  ?  ?  ?  ?  ?  ? ?  ?Cognition Arousal/Alertness: Lethargic ?Behavior During Therapy: Flat affect ?Overall Cognitive Status: History of cognitive impairments - at baseline ?Area of Impairment: Following commands, Safety/judgement, Problem solving ?  ?  ?  ?  ?  ?  ?  ?  ?  ?  ?  ?Following Commands: Follows one step commands consistently ?Safety/Judgement: Decreased awareness of safety ?  ?Problem Solving: Slow processing, Decreased initiation, Requires verbal cues ?General Comments: Pt with slow processing and cuing for sequencing and initiation ?  ?  ? ?  ?Exercises Other Exercises ?Other Exercises: Max A for pericare ? ?  ?General Comments General comments (skin integrity, edema, etc.): BP taken at beginning of session: 133/47mmHg, HR: 66bpm ?  ?  ? ?Pertinent Vitals/Pain Pain Assessment ?Pain Assessment: No/denies pain ?Pain Intervention(s): Monitored during session  ? ? ?Home Living   ?  ?  ?  ?  ?  ?  ?  ?  ?  ?   ?  ?Prior Function    ?  ?  ?   ? ?PT Goals (current goals can now be found in the care plan section) Acute Rehab PT Goals ?Patient Stated Goal: to return home ?PT Goal Formulation: With patient/family ?Time For Goal Achievement: 01/20/22 ?Potential to Achieve Goals: Good ?Progress towards PT goals: Progressing toward goals ? ?  ?Frequency ? ? ? Min 2X/week ? ? ? ?  ?PT Plan Current plan remains appropriate  ? ? ?Co-evaluation   ?  ?  ?  ?  ? ?  ?AM-PAC PT "6 Clicks" Mobility    ?Outcome Measure ? Help needed turning from your back to your side while in a flat bed without using bedrails?: None ?Help needed moving from lying on your back to sitting on the side of a flat bed without using bedrails?: None ?Help needed moving to and from a bed to a chair (including a wheelchair)?: A Little ?Help needed standing up from a chair using your arms (e.g., wheelchair or bedside chair)?: A Little ?Help needed to walk in hospital room?: A Little ?Help needed climbing 3-5 steps with a railing? : A Little ?6 Click Score: 20 ? ?  ?End of Session Equipment Utilized During Treatment: Gait belt ?Activity Tolerance: Patient tolerated treatment well;No increased pain ?Patient left: in bed;with call bell/phone within reach;with bed alarm set ?Nurse Communication: Mobility status ?PT Visit Diagnosis: Unsteadiness on feet (R26.81);Muscle weakness (generalized) (M62.81);Difficulty in walking, not elsewhere classified (R26.2) ?  ? ? ?Time: 8101-7510 ?PT Time Calculation (min) (ACUTE ONLY): 19 min ? ?Charges:  $Therapeutic Activity: 8-22 mins          ?          ? ?Iva Boop, PT  ?01/15/22. 2:37 PM ? ? ?

## 2022-01-15 NOTE — Progress Notes (Signed)
[facility]  ROUNDING NOTE   Subjective:  Pt was admitted to [facility] on 12/22/2021 with intractable vomiting on 12/22/2021 with intractable vomiting which has been going for weeks.  He noted to have mild leukocytosis and AKI on admission patient has a past medical history significant for dementia, hypertension, HFpEF, CKD 3a and CVA.  We were asked to see the patient for AKI with a significantly elevated BUN and creatinine  Patient underwent hemodialysis treatment yesterday. Tolerated well. Urine output remains quite low as documented at 300 cc over the preceding 24 hours.  Creatinine lower today at 3.78 however this is under the influence of dialysis. Predialysis creatinine was 5.65 with a EGFR of 11.    Objective:  Vital signs in last 24 hours:  Temp:  [98.4 F (36.9 C)-98.8 F (37.1 C)] 98.5 F (36.9 C) (03/12 1531) Pulse Rate:  [63-69] 69 (03/12 1531) Resp:  [16-18] 16 (03/12 0437) BP: (124-175)/(67-81) 124/70 (03/12 1531) SpO2:  [100 %] 100 % (03/12 1531)  Weight change:  Filed Weights   01/11/22 1155 01/14/22 0902 01/14/22 1202  Weight: 70.8 kg 71.2 kg 69.3 kg    Intake/Output: I/O last 3 completed shifts: In: 240 [P.O.:240] Out: 500 [Urine:500]   Intake/Output this shift:  Total I/O In: 180 [P.O.:180] Out: -   Physical Exam: General: No acute distress  Head: Moist oral mucosal membranes, hearing intact  Eyes: Anicteric  Lungs:  Respirations symmetrical ,unlabored, lungs clear to auscultation  Heart: S1S2 no rubs  Abdomen:  Soft, nontender  Extremities: No peripheral edema.  Neurologic: Awake,alert, able to answer simple questions  Skin: No acute rashes or lesions  Access: Rt IJ permcath placed by Dr Lucky Cowboy on 16/10/96    Basic Metabolic Panel: Recent Labs  Lab 01/11/22 0534 01/12/22 0519 01/13/22 0432 01/14/22 0542 01/15/22 0547  NA 137 137 136 136 134*  K 3.8 3.9 3.7 3.8 3.6  CL 100 96* 97* 98 95*  CO2 '28 30 31 30 ' 33*  GLUCOSE 113* 83 90  92 107*  BUN 28* 16 24* 25* 14  CREATININE 6.27* 4.36* 5.60* 5.65* 3.78*  CALCIUM 8.8* 8.9 8.4* 8.5* 8.4*     Liver Function Tests: No results for input(s): AST, ALT, ALKPHOS, BILITOT, PROT, ALBUMIN in the last 168 hours.  No results for input(s): LIPASE, AMYLASE in the last 168 hours.  No results for input(s): AMMONIA in the last 168 hours.  CBC: Recent Labs  Lab 01/10/22 0450 01/14/22 0542  WBC 10.8* 8.2  HGB 8.5* 7.9*  HCT 25.7* 24.1*  MCV 98.1 98.0  PLT 236 237     Cardiac Enzymes: No results for input(s): CKTOTAL, CKMB, CKMBINDEX, TROPONINI in the last 168 hours.  BNP: Invalid input(s): POCBNP  CBG: No results for input(s): GLUCAP in the last 168 hours.   Microbiology: Results for orders placed or performed during the hospital encounter of 12/22/21  Resp Panel by RT-PCR (Flu A&B, Covid) Nasopharyngeal Swab     Status: None   Collection Time: 12/22/21 11:18 PM   Specimen: Nasopharyngeal Swab; Nasopharyngeal(NP) swabs in vial transport medium  Result Value Ref Range Status   SARS Coronavirus 2 by RT PCR NEGATIVE NEGATIVE Final    Comment: (NOTE) SARS-CoV-2 target nucleic acids are NOT DETECTED.  The SARS-CoV-2 RNA is generally detectable in upper respiratory specimens during the acute phase of infection. The lowest concentration of SARS-CoV-2 viral copies this assay can detect is 138 copies/mL. A negative result does not preclude SARS-Cov-2 infection and should  not be used as the sole basis for treatment or other patient management decisions. A negative result may occur with  improper specimen collection/handling, submission of specimen other than nasopharyngeal swab, presence of viral mutation(s) within the areas targeted by this assay, and inadequate number of viral copies(<138 copies/mL). A negative result must be combined with clinical observations, patient history, and epidemiological information. The expected result is Negative.  Fact Sheet for  Patients:  EntrepreneurPulse.com.au  Fact Sheet for Healthcare Providers:  IncredibleEmployment.be  This test is no t yet approved or cleared by the [place] FDA and  has been authorized for detection and/or diagnosis of SARS-CoV-2 by FDA under an Emergency Use Authorization (EUA). This EUA will remain  in effect (meaning this test can be used) for the duration of the COVID-19 declaration under Section 564(b)(1) of the Act, 21 U.S.C.section 360bbb-3(b)(1), unless the authorization is terminated  or revoked sooner.       Influenza A by PCR NEGATIVE NEGATIVE Final   Influenza B by PCR NEGATIVE NEGATIVE Final    Comment: (NOTE) The Xpert Xpress SARS-CoV-2/FLU/RSV plus assay is intended as an aid in the diagnosis of influenza from Nasopharyngeal swab specimens and should not be used as a sole basis for treatment. Nasal washings and aspirates are unacceptable for Xpert Xpress SARS-CoV-2/FLU/RSV testing.  Fact Sheet for Patients: EntrepreneurPulse.com.au  Fact Sheet for Healthcare Providers: IncredibleEmployment.be  This test is not yet approved or cleared by the [place] FDA and has been authorized for detection and/or diagnosis of SARS-CoV-2 by FDA under an Emergency Use Authorization (EUA). This EUA will remain in effect (meaning this test can be used) for the duration of the COVID-19 declaration under Section 564(b)(1) of the Act, 21 U.S.C. section 360bbb-3(b)(1), unless the authorization is terminated or revoked.  Performed at [facility], [address] [place], [place] [address]     Coagulation Studies: No results for input(s): LABPROT, INR in the last 72 hours.  Urinalysis: No results for input(s): COLORURINE, LABSPEC, PHURINE, GLUCOSEU, HGBUR, BILIRUBINUR, KETONESUR, PROTEINUR, UROBILINOGEN, NITRITE, LEUKOCYTESUR in the last 72 hours.  Invalid input(s): APPERANCEUR      Imaging: No results found.   Medications:      ARIPiprazole  1 mg Oral Daily   aspirin EC  81 mg Oral Daily   carvedilol  12.5 mg Oral BID WC   Chlorhexidine Gluconate Cloth  6 each Topical Q0600   diclofenac Sodium  2 g Topical QID   feeding supplement (NEPRO CARB STEADY)  237 mL Oral TID BM   heparin injection (subcutaneous)  5,000 Units Subcutaneous Q8H   multivitamin  1 tablet Oral QHS   pantoprazole  40 mg Oral BID   polyethylene glycol  34 g Oral Daily   rosuvastatin  10 mg Oral Daily   senna  1 tablet Oral QHS   sertraline  100 mg Oral Daily   sodium bicarbonate  650 mg Oral TID   tamsulosin  0.4 mg Oral Daily   acetaminophen **OR** acetaminophen, hydrALAZINE, ondansetron **OR** ondansetron (ZOFRAN) IV, ondansetron (ZOFRAN) IV, sorbitol, milk of mag, mineral oil, glycerin (SMOG) enema  Assessment/ Plan:  61 y.o. male with a past medical history significant for dementia, hypertension, heart failure, CKD 3a and CVA admitted to [facility] on 12/22/2021 with intractable vomiting.  Also noted to have mild leukocytosis and AKI.   #1 AKI on CKD 3a with baseline creatinine of 1.4 and EGFR of 52.    It is felt that acute kidney injury could  have been contributed from usage of Advil/NSAIDs and from dehydration secondary to intractable vomiting.   Patient creatinine remained high at around 8. Patient will require renal replacement therapy for now as patient creatinine since his last dialysis has increased steadily from 5. To -6.3 to 7.5 to around 7.7-- 8. Predialysis yesterday creatinine was 5.65 and EGFR of 11.  Making little urine.  Therefore remains dialysis dependent at the moment.  This could potentially represent ESRD.  Lab Results  Component Value Date   CREATININE 3.78 (H) 01/15/2022   CREATININE 5.65 (H) 01/14/2022   CREATININE 5.60 (H) 01/13/2022    #2 Recurrent vomiting  Likely multifactorial including advancing dementia ,medication side effect or functional dyspepsia.   GI is consulted.  EGD completed with no signs of active bleeding.  Did see gastritis, small hiatal hernia and LA grade B reflux esophagitis.   On Pantoprazole 40 mg PO BID  3. Anemia of chronic kidney disease  Normocytic Lab Results  Component Value Date   HGB 7.9 (L) 01/14/2022  Consider adding Epogen given low hemoglobin of 7.9.  4.   Hypertension, well controlled.  Blood pressure currently 124/70.  Continue carvedilol at this time.   LOS: 22 Cherrie Franca 3/12/20233:46 PM

## 2022-01-15 NOTE — Progress Notes (Signed)
?PROGRESS NOTE ? ? ? ?Randall [name]  XBD:[id number] DOB: [birth year] DOA: 12/22/2021 ?PCP: Marlowe Aschoff, MD  ? ? ?Brief Narrative:  ?Patient initially admitted to [facility] on 2/16 with chief complaint of intractable nausea and vomiting progressing over the past several weeks.  Patient had significant AKI on admission.  History of HFpEF, CKD stage IIIa and CVA.  Nephrology involved.  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  Started on intermittent hemodialysis. ? ?Current.  Patient is medically stable however we are lacking a safe disposition plan.  Outpatient dialysis coordinator working on setting up HD chair time. ? ? ?Assessment & Plan: ?  ?Principal Problem: ?  Recurrent vomiting ?Active Problems: ?  Leukocytosis ?  Dementia ([place]) ?  Chronic systolic CHF (congestive heart failure) ([place]) ?  History of CVA (cerebrovascular accident) ?  Acute renal failure superimposed on stage 3a chronic kidney disease ([place]) ?  Moderate major depression ([place]) ?  Intractable nausea and vomiting ?  Essential hypertension ? ?Acute renal failure superimposed on stage 3a chronic kidney disease ([place]) ?Good urinary output though doesn't appear to be accurately recorded by nursing. ?Nephrology was consulted and they decided to proceed with hemodialysis.  Received 3 sessions of initial dialysis with temporary catheter which was then switched with permanent cath. GFR remains below 15 ?- next dialysis planned for 3/8, is getting dialyzed intermittently. Nephrology at work on outpatient chair ?- last dialysis was 3/8 ?- has right IJ tunneled dialysis catheter placed by vascular surgery on 2/24 ?Plan: ?Nephrology following for inpatient hemodialysis needs ?Last HD 3/11 ?Next HD planned for Tuesday 3/14 ?Discharge delayed by lack of safe disposition plan as the patient's insurance does not cover outpatient hemodialysis ? ?Recurrent vomiting- (present on admission) ?resolved. ?Recurrent episodes with second hospitalization  in 2 weeks for the same ?No definite etiology identified. ?GI was consulted and patient underwent EGD on 12/26/2020 which shows esophagitis, some esophageal dysmotility, gastritis and few nonbleeding erosions in duodenum, biopsies were taken, which were negative. Vomiting and nausea resolved, tolerating diet ?-Continue with Protonix ?-Okay for diet as tolerated ?  ?Anemia ?Hgb 8s from 11s on admission.  ?No hematochezia or melena or other bleeding.  ?Likely 2/2 new renal dysfunction ?- monitor  ?-Currently no indication for transfusion ?  ?Hypokalemia ?Resolved w/ supplementation ?  ?Essential hypertension- (present on admission) ?Blood pressure improved after adding hydralazine. ?-Hold Entresto as advised by nephrology while waiting for any renal function recovery. ?-Continue amlodipine  ?-Continue hydralazine, increase dose today ?-Continue to monitor ?  ?Moderate major depression ([place]) ?- Continue Abilify and Zoloft ?  ? ?History of CVA (cerebrovascular accident) ?Continue aspirin and statin ?  ?Chronic diastolic CHF (congestive heart failure) ([place])- (present on admission) ?Currently euvolemic. Ef 50% on 2021 TTE w/ grade 1 dysfunction ?-Continue Coreg, Carvedilol  ?-holding entresto as above ?  ?Dementia ([place])- (present on admission) ?Continue Namenda and sertraline ?  ?Constipation ?Large BM 3/5 ?- cont miralax/senna ? ? ?DVT prophylaxis: SQ heparin ?Code Status: Full ?Family Communication: Son at bedside 3/8, sister at bedside 3/9, 3/10 ?Disposition Plan: Status is: Inpatient ?Remains inpatient appropriate because: Unsafe discharge plan.  Medically stable ? ? ?Level of care: Med-Surg ? ?Consultants:  ?Nephrology ? ?Procedures:  ?Tunneled dialysis catheter placement ? ?Antimicrobials: ?None ? ? ?Subjective: ?Seen and examined.  Resting in bed.  Appears fatigued.  No pain complaints.  No family at bedside this morning ? ?Objective: ?Vitals:  ? 01/14/22 1301 01/14/22 2049 01/15/22 0437 01/15/22 0808  ?BP: Marland Kitchen)  173/80 (!) 175/81 129/67 (!) 161/72  ?Pulse: 64 63 64 65  ?Resp: 18 18 16    ?Temp: 98.1 ?F (36.7 ?C) 98.8 ?F (37.1 ?C) 98.5 ?F (36.9 ?C) 98.4 ?F (36.9 ?C)  ?TempSrc: Oral Oral Oral   ?SpO2: 100% 100% 100% 100%  ?Weight:      ?Height:      ? ? ?Intake/Output Summary (Last 24 hours) at 01/15/2022 1032 ?Last data filed at 01/15/2022 1006 ?Gross per 24 hour  ?Intake 420 ml  ?Output 300 ml  ?Net 120 ml  ? ?Filed Weights  ? 01/11/22 1155 01/14/22 0902 01/14/22 1202  ?Weight: 70.8 kg 71.2 kg 69.3 kg  ? ? ?Examination: ? ?General exam: No acute distress.  Appears fatigued ?Respiratory system: Clear to auscultation. Respiratory effort normal. ?Cardiovascular system: S1-S2, RRR, no murmurs, no pedal edema ?Gastrointestinal system: Soft, NT/ND, normal bowel sounds ?Central nervous system: Alert and oriented. No focal neurological deficits. ?Extremities: Symmetric 5 x 5 power. ?Skin: No rashes, lesions or ulcers ?Psychiatry: Judgement and insight appear normal. Mood & affect appropriate.  ? ? ? ?Data Reviewed: I have personally reviewed following labs and imaging studies ? ?CBC: ?Recent Labs  ?Lab 01/10/22 ?0450 01/14/22 ?0542  ?WBC 10.8* 8.2  ?HGB 8.5* 7.9*  ?HCT 25.7* 24.1*  ?MCV 98.1 98.0  ?PLT 236 237  ? ?Basic Metabolic Panel: ?Recent Labs  ?Lab 01/11/22 ?4081 01/12/22 ?4481 01/13/22 ?0432 01/14/22 ?8563 01/15/22 ?1497  ?NA 137 137 136 136 134*  ?K 3.8 3.9 3.7 3.8 3.6  ?CL 100 96* 97* 98 95*  ?CO2 28 30 31 30  33*  ?GLUCOSE 113* 83 90 92 107*  ?BUN 28* 16 24* 25* 14  ?CREATININE 6.27* 4.36* 5.60* 5.65* 3.78*  ?CALCIUM 8.8* 8.9 8.4* 8.5* 8.4*  ? ?GFR: ?Estimated Creatinine Clearance: 20.4 mL/min (A) (by C-G formula based on SCr of 3.78 mg/dL (H)). ?Liver Function Tests: ?No results for input(s): AST, ALT, ALKPHOS, BILITOT, PROT, ALBUMIN in the last 168 hours. ?No results for input(s): LIPASE, AMYLASE in the last 168 hours. ?No results for input(s): AMMONIA in the last 168 hours. ?Coagulation Profile: ?No results for input(s):  INR, PROTIME in the last 168 hours. ?Cardiac Enzymes: ?No results for input(s): CKTOTAL, CKMB, CKMBINDEX, TROPONINI in the last 168 hours. ?BNP (last 3 results) ?No results for input(s): PROBNP in the last 8760 hours. ?HbA1C: ?No results for input(s): HGBA1C in the last 72 hours. ?CBG: ?No results for input(s): GLUCAP in the last 168 hours. ?Lipid Profile: ?No results for input(s): CHOL, HDL, LDLCALC, TRIG, CHOLHDL, LDLDIRECT in the last 72 hours. ?Thyroid Function Tests: ?No results for input(s): TSH, T4TOTAL, FREET4, T3FREE, THYROIDAB in the last 72 hours. ?Anemia Panel: ?No results for input(s): VITAMINB12, FOLATE, FERRITIN, TIBC, IRON, RETICCTPCT in the last 72 hours. ?Sepsis Labs: ?No results for input(s): PROCALCITON, LATICACIDVEN in the last 168 hours. ? ?No results found for this or any previous visit (from the past 240 hour(s)).  ? ? ? ? ? ?Radiology Studies: ?No results found. ? ? ? ? ? ?Scheduled Meds: ? ARIPiprazole  1 mg Oral Daily  ? aspirin EC  81 mg Oral Daily  ? carvedilol  12.5 mg Oral BID WC  ? Chlorhexidine Gluconate Cloth  6 each Topical Q0600  ? diclofenac Sodium  2 g Topical QID  ? feeding supplement (NEPRO CARB STEADY)  237 mL Oral TID BM  ? heparin injection (subcutaneous)  5,000 Units Subcutaneous Q8H  ? multivitamin  1 tablet Oral QHS  ? pantoprazole  40 mg Oral BID  ? polyethylene glycol  34 g Oral Daily  ? rosuvastatin  10 mg Oral Daily  ? senna  1 tablet Oral QHS  ? sertraline  100 mg Oral Daily  ? sodium bicarbonate  650 mg Oral TID  ? tamsulosin  0.4 mg Oral Daily  ? ?Continuous Infusions: ? ? LOS: 22 days  ? ? ? ? ?Sidney Ace, MD ?[facility] ? ? ?If 7PM-7AM, please contact night-coverage ? ?01/15/2022, 10:32 AM  ? ?

## 2022-01-16 LAB — RENAL FUNCTION PANEL
Albumin: 2.9 g/dL — ABNORMAL LOW (ref 3.5–5.0)
Anion gap: 6 (ref 5–15)
BUN: 22 mg/dL — ABNORMAL HIGH (ref 6–20)
CO2: 33 mmol/L — ABNORMAL HIGH (ref 22–32)
Calcium: 8.5 mg/dL — ABNORMAL LOW (ref 8.9–10.3)
Chloride: 96 mmol/L — ABNORMAL LOW (ref 98–111)
Creatinine, Ser: 4.74 mg/dL — ABNORMAL HIGH (ref 0.61–1.24)
GFR, Estimated: 13 mL/min — ABNORMAL LOW (ref >60)
Glucose, Bld: 103 mg/dL — ABNORMAL HIGH (ref 70–99)
Phosphorus: 3.1 mg/dL (ref 2.5–4.6)
Potassium: 3.7 mmol/L (ref 3.5–5.1)
Sodium: 135 mmol/L (ref 135–145)

## 2022-01-16 NOTE — Progress Notes (Signed)
Occupational Therapy Treatment ?Patient Details ?Name: Randall [name] ?MRN: [id number] ?DOB: 14-Oct-1961 ?Today's Date: 01/16/2022 ? ? ?History of present illness Randall [name] is a 61 y.o. male seen for evaluation of recurrent vomiting at the request of Dr. Judd Gaudier. Patient has a PMH of dementia, hypertension, diabetes, chronic kidney disease, CHF.  Patient presented to the [facility] ED for chief complaint of nausea, vomiting and weakness. Upon presentation to the ED yesterday, vital signs were stable with blood pressure 120/72, heart rate 67, respirations 18, temperature 98.7, pulse oximetry 97%. Labs were significant for new AKI with Cr 3.5, normal electrolytes GFR 19, glucose 176, WBC 17.9, UA unremarkable.  Imaging studies revealed CT of the abdomen pelvis revealed a possible tiny hiatal hernia, constipation, otherwise no acute intra-abdominal or intrapelvic abnormality.  Aortic atherosclerosis.  He is being admitted for IV hydration.  GI is consulted for evaluation and management of recurrent vomiting. ?  ?OT comments ? Upon entering the room, pt supine in bed still attempting to eat breakfast. OT can see pt's dentures moving around in mouth and not fitting well for meal. Pt performed supine >sit without physical assistance. Pt standing with min guard and taking several steps to sink for oral care. Pt able to perform denture care with min guard for standing balance. Pt needing increased time to process information and for sequencing. Pt denies need for toileting but OT encouraged pt to attempt. Pt ambulating with min guard 10' into bathroom. Pt standing for clothing management and urination with close supervision. Pt was successful with voiding and then returning to bed secondary to fatigue. Pt's nephew enters the room and remains with pt as therapist exits. All needs within reach and bed alarm activated. Pt continues to benefit from OT intervention.   ? ?Recommendations for follow up therapy are one  component of a multi-disciplinary discharge planning process, led by the attending physician.  Recommendations may be updated based on patient status, additional functional criteria and insurance authorization. ?   ?Follow Up Recommendations ? Home health OT  ?  ?Assistance Recommended at Discharge Intermittent Supervision/Assistance  ?Patient can return home with the following ? A little help with walking and/or transfers;Assistance with cooking/housework;Assist for transportation;Direct supervision/assist for medications management;Direct supervision/assist for financial management;Help with stairs or ramp for entrance;A little help with bathing/dressing/bathroom ?  ?Equipment Recommendations ? BSC/3in1  ?  ?   ?Precautions / Restrictions Precautions ?Precautions: Fall  ? ? ?  ? ?Mobility Bed Mobility ?Overal bed mobility: Modified Independent ?  ?  ?  ?  ?  ?  ?General bed mobility comments: no physical assist needed ?  ? ?Transfers ?Overall transfer level: Needs assistance ?Equipment used: 1 person hand held assist ?Transfers: Sit to/from Stand, Bed to chair/wheelchair/BSC ?Sit to Stand: Supervision ?  ?  ?Step pivot transfers: Min guard ?  ?  ?  ?  ?  ?Balance Overall balance assessment: Needs assistance ?Sitting-balance support: Feet supported ?Sitting balance-Leahy Scale: Good ?  ?  ?Standing balance support: No upper extremity supported, During functional activity ?Standing balance-Leahy Scale: Fair ?  ?  ?  ?  ?  ?  ?  ?  ?  ?  ?  ?  ?   ? ?ADL either performed or assessed with clinical judgement  ? ?ADL Overall ADL's : Needs assistance/impaired ?  ?  ?Grooming: Wash/dry hands;Wash/dry face;Standing;Oral care;Min guard;Cueing for sequencing ?  ?  ?  ?  ?  ?  ?  ?  ?  ?  ?  ?  ?  ?  ?  ?  ?  ?  ? ?  Extremity/Trunk Assessment Upper Extremity Assessment ?Upper Extremity Assessment: Generalized weakness ?LUE Deficits / Details: hx chronic shoulder pain, limiting shoulder flexion to less than 45* AROM ?  ?  ?  ?   ?  ? ?Vision Patient Visual Report: No change from baseline ?  ?  ?   ?   ? ?Cognition Arousal/Alertness: Awake/alert ?Behavior During Therapy: Flat affect ?Overall Cognitive Status: History of cognitive impairments - at baseline ?  ?  ?  ?  ?  ?  ?  ?  ?  ?  ?  ?  ?  ?  ?  ?  ?General Comments: Slow processing with min cuing for sequenceing of self care tasks ?  ?  ?   ?   ?   ?   ? ? ?Pertinent Vitals/ Pain       Pain Assessment ?Pain Assessment: No/denies pain ? ?Home Living   ?  ?  ?  ?  ?  ?  ?  ?  ?  ?  ?  ?  ?  ?  ?  ?  ?  ?  ? ?  ?   ? ?Frequency ? Min 2X/week  ? ? ? ? ?  ?Progress Toward Goals ? ?OT Goals(current goals can now be found in the care plan section) ? Progress towards OT goals: Progressing toward goals ? ?Acute Rehab OT Goals ?Patient Stated Goal: to go home ?OT Goal Formulation: With patient ?Time For Goal Achievement: 01/19/22 ?Potential to Achieve Goals: Good  ?Plan Discharge plan remains appropriate;Frequency remains appropriate   ? ?   ?AM-PAC OT "6 Clicks" Daily Activity     ?Outcome Measure ? ? Help from another person eating meals?: None ?Help from another person taking care of personal grooming?: None ?Help from another person toileting, which includes using toliet, bedpan, or urinal?: A Little ?Help from another person bathing (including washing, rinsing, drying)?: A Little ?Help from another person to put on and taking off regular upper body clothing?: None ?Help from another person to put on and taking off regular lower body clothing?: A Little ?6 Click Score: 21 ? ?  ?End of Session   ? ?OT Visit Diagnosis: Other abnormalities of gait and mobility (R26.89) ?  ?Activity Tolerance Patient tolerated treatment well ?  ?Patient Left in bed;with call bell/phone within reach;with bed alarm set;with family/visitor present ?  ?Nurse Communication Mobility status ?  ? ?   ? ?Time: 1308-6578 ?OT Time Calculation (min): 23 min ? ?Charges: OT General Charges ?$OT Visit: 1 Visit ?OT  Treatments ?$Self Care/Home Management : 23-37 mins ? ?Darleen Crocker, [place], OTR/L , CBIS ?ascom [phone]  ?01/16/22, 2:50 PM  ?

## 2022-01-16 NOTE — Progress Notes (Signed)
Physical Therapy Treatment ?Patient Details ?Name: Randall [name] ?MRN: [id number] ?DOB: 03-12-1961 ?Today's Date: 01/16/2022 ? ? ?History of Present Illness Randall [name] is a 61 y.o. male seen for evaluation of recurrent vomiting at the request of Dr. Judd Gaudier. Patient has a PMH of dementia, hypertension, diabetes, chronic kidney disease, CHF.  Patient presented to the [facility] ED for chief complaint of nausea, vomiting and weakness. Upon presentation to the ED yesterday, vital signs were stable with blood pressure 120/72, heart rate 67, respirations 18, temperature 98.7, pulse oximetry 97%. Labs were significant for new AKI with Cr 3.5, normal electrolytes GFR 19, glucose 176, WBC 17.9, UA unremarkable.  Imaging studies revealed CT of the abdomen pelvis revealed a possible tiny hiatal hernia, constipation, otherwise no acute intra-abdominal or intrapelvic abnormality.  Aortic atherosclerosis.  He is being admitted for IV hydration.  GI is consulted for evaluation and management of recurrent vomiting. ? ?  ?PT Comments  ? ? Pt received upright in bed with family present. Agreeable to PT services. BP take prior and post session. See below for numbers. Pt indep with bed mobility with increased time and standing with supervision. Pt performing 2 laps around nurses station with minguard with focus on obstacle navigation and dynamic balance tasks (sudden stops, horizontal/vertical head turns). Hard to decipher if pt has poor proprioception, visual impairments, or cause of baseline dementia, but pt has difficulty with navigating door way, room furniture, and various objects in hallway without consistent, mod VC's throughout. Pt is however appearing more steady and comfortable ambulating without AD as with normal walking speed pt able to ambulate with supervision and no imbalance noted. Throughout session pt educated on visual scanning for obstacle navigation to prevent future falls or injury. Pt returned to supine  in bed with all needs in reach. Pt progressing towards goals and will continue to benefit from acute PT to further improve dynamic balance and environmental navigation. D/c recs remain appropriate.  ? ?BP prior to mobility: 171/84 mm Hg  ?BP post mobility: 150/79 mm Hg ?  ?Recommendations for follow up therapy are one component of a multi-disciplinary discharge planning process, led by the attending physician.  Recommendations may be updated based on patient status, additional functional criteria and insurance authorization. ? ?Follow Up Recommendations ? Home health PT ?  ?  ?Assistance Recommended at Discharge Intermittent Supervision/Assistance  ?Patient can return home with the following A little help with walking and/or transfers;A little help with bathing/dressing/bathroom;Help with stairs or ramp for entrance;Assist for transportation;Direct supervision/assist for financial management;Direct supervision/assist for medications management;Assistance with cooking/housework ?  ?Equipment Recommendations ? Rolling walker (2 wheels)  ?  ?Recommendations for Other Services   ? ? ?  ?Precautions / Restrictions Precautions ?Precautions: Fall ?Restrictions ?Weight Bearing Restrictions: No  ?  ? ?Mobility ? Bed Mobility ?Overal bed mobility: Modified Independent ?Bed Mobility: Supine to Sit, Sit to Supine ?  ?  ?Supine to sit: Modified independent (Device/Increase time), HOB elevated ?Sit to supine: Modified independent (Device/Increase time), HOB elevated ?  ?General bed mobility comments: no physical assist needed ?Patient Response: Cooperative, Flat affect ? ?Transfers ?Overall transfer level: Needs assistance ?Equipment used: None ?Transfers: Sit to/from Stand ?Sit to Stand: Supervision ?  ?  ?  ?  ?  ?  ?  ? ?Ambulation/Gait ?Ambulation/Gait assistance: Min guard ?Gait Distance (Feet): 340 Feet ?Assistive device: None ?Gait Pattern/deviations: Decreased step length - right, Decreased step length - left, Decreased  stride length ?  ?  ?  ?  General Gait Details: Hard to decipher if visual impairment, state of dementia, or poor proprioception as pt requires frequent VC's for navigating obstacles in room and hallway with gait. Otherwise steady with ambulating in hallway without AD. ? ? ?Stairs ?  ?  ?  ?  ?  ? ? ?Wheelchair Mobility ?  ? ?Modified Rankin (Stroke Patients Only) ?  ? ? ?  ?Balance Overall balance assessment: Needs assistance ?Sitting-balance support: Feet supported ?Sitting balance-Leahy Scale: Good ?  ?  ?Standing balance support: No upper extremity supported, During functional activity ?Standing balance-Leahy Scale: Fair ?  ?  ?  ?  ?  ?  ?  ?High level balance activites: Sudden stops, Head turns ?High Level Balance Comments: Mild AP unsteadiness requiring ankle strategy with sudden stops in gait. Horizontal sn vertical head turns 1x20'/exercise. ?  ?  ?  ?  ? ?  ?Cognition Arousal/Alertness: Awake/alert ?Behavior During Therapy: Flat affect ?Overall Cognitive Status: History of cognitive impairments - at baseline ?Area of Impairment: Following commands, Safety/judgement, Problem solving ?  ?  ?  ?  ?  ?  ?  ?  ?  ?  ?  ?Following Commands: Follows one step commands consistently ?Safety/Judgement: Decreased awareness of safety ?  ?Problem Solving: Slow processing, Decreased initiation, Requires verbal cues ?  ?  ?  ? ?  ?Exercises Other Exercises ?Other Exercises: Education on scanning environment to prevent running into obstacles. ? ?  ?General Comments General comments (skin integrity, edema, etc.): BP prior: 171/84 mm Hg, BP post: 150/79 mm Hg ?  ?  ? ?Pertinent Vitals/Pain Pain Assessment ?Pain Assessment: No/denies pain  ? ? ?Home Living   ?  ?  ?  ?  ?  ?  ?  ?  ?  ?   ?  ?Prior Function    ?  ?  ?   ? ?PT Goals (current goals can now be found in the care plan section) Acute Rehab PT Goals ?Patient Stated Goal: to return home ?PT Goal Formulation: With patient/family ?Time For Goal Achievement:  01/20/22 ?Potential to Achieve Goals: Good ?Progress towards PT goals: Progressing toward goals ? ?  ?Frequency ? ? ? Min 2X/week ? ? ? ?  ?PT Plan Current plan remains appropriate  ? ? ?Co-evaluation   ?  ?  ?  ?  ? ?  ?AM-PAC PT "6 Clicks" Mobility   ?Outcome Measure ? Help needed turning from your back to your side while in a flat bed without using bedrails?: None ?Help needed moving from lying on your back to sitting on the side of a flat bed without using bedrails?: None ?Help needed moving to and from a bed to a chair (including a wheelchair)?: A Little ?Help needed standing up from a chair using your arms (e.g., wheelchair or bedside chair)?: A Little ?Help needed to walk in hospital room?: A Little ?Help needed climbing 3-5 steps with a railing? : A Little ?6 Click Score: 20 ? ?  ?End of Session Equipment Utilized During Treatment: Gait belt ?Activity Tolerance: Patient tolerated treatment well ?Patient left: in bed;with call bell/phone within reach;with bed alarm set;with nursing/sitter in room;with family/visitor present ?Nurse Communication: Mobility status ?PT Visit Diagnosis: Unsteadiness on feet (R26.81);Muscle weakness (generalized) (M62.81);Difficulty in walking, not elsewhere classified (R26.2) ?  ? ? ?Time: 0960-4540 ?PT Time Calculation (min) (ACUTE ONLY): 18 min ? ?Charges:  $Neuromuscular Re-education: 8-22 mins          ?          ? ?  Salem Caster. Fairly IV, PT, DPT ?Physical Therapist- [place]  ?[facility]  ?01/16/2022, 2:28 PM ? ?

## 2022-01-16 NOTE — Progress Notes (Signed)
?PROGRESS NOTE ? ? ? ?Randall [name]  KGU:[id number] DOB: 11-02-1961 DOA: 12/22/2021 ?PCP: Marlowe Aschoff, MD  ? ? ?Brief Narrative:  ?Patient initially admitted to [facility] on 2/16 with chief complaint of intractable nausea and vomiting progressing over the past several weeks.  Patient had significant AKI on admission.  History of HFpEF, CKD stage IIIa and CVA.  Nephrology involved.  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  Started on intermittent hemodialysis. ? ?Current.  Patient is medically stable however we are lacking a safe disposition plan.  Outpatient dialysis coordinator working on setting up HD chair time. ? ? ?Assessment & Plan: ?  ?Principal Problem: ?  Recurrent vomiting ?Active Problems: ?  Leukocytosis ?  Dementia ([place]) ?  Chronic systolic CHF (congestive heart failure) ([place]) ?  History of CVA (cerebrovascular accident) ?  Acute renal failure superimposed on stage 3a chronic kidney disease ([place]) ?  Moderate major depression ([place]) ?  Intractable nausea and vomiting ?  Essential hypertension ? ?Acute renal failure superimposed on stage 3a chronic kidney disease ([place]) ?Good urinary output though doesn't appear to be accurately recorded by nursing. ?Nephrology was consulted and they decided to proceed with hemodialysis.  Received 3 sessions of initial dialysis with temporary catheter which was then switched with permanent cath. GFR remains below 15 ?- next dialysis planned for 3/8, is getting dialyzed intermittently. Nephrology at work on outpatient chair ?- last dialysis was 3/8 ?- has right IJ tunneled dialysis catheter placed by vascular surgery on 2/24 ?Plan: ?Nephrology following for inpatient hemodialysis needs ?Last HD 3/11 ?Next HD planned for Tuesday 3/14 ?Discussed with nephrology ?Patient has not demonstrated signs of renal recovery ?Stable for discharge once we have establishment of outpatient HD ? ?Recurrent vomiting- (present on admission) ?resolved. ?Recurrent episodes  with second hospitalization in 2 weeks for the same ?No definite etiology identified. ?GI was consulted and patient underwent EGD on 12/26/2020 which shows esophagitis, some esophageal dysmotility, gastritis and few nonbleeding erosions in duodenum, biopsies were taken, which were negative. Vomiting and nausea resolved, tolerating diet ?-Continue with Protonix ?-Okay for diet as tolerated ?  ?Anemia ?Hgb 8s from 11s on admission.  ?No hematochezia or melena or other bleeding.  ?Likely 2/2 new renal dysfunction ?- monitor  ?-Currently no indication for transfusion ?  ?Hypokalemia ?Resolved w/ supplementation ?  ?Essential hypertension- (present on admission) ?Blood pressure improved after adding hydralazine. ?-Hold Entresto as advised by nephrology while waiting for any renal function recovery. ?-Continue amlodipine  ?-Continue hydralazine, increase dose today ?-Continue to monitor ?  ?Moderate major depression ([place]) ?- Continue Abilify and Zoloft ?  ? ?History of CVA (cerebrovascular accident) ?Continue aspirin and statin ?  ?Chronic diastolic CHF (congestive heart failure) ([place])- (present on admission) ?Currently euvolemic. Ef 50% on 2021 TTE w/ grade 1 dysfunction ?-Continue Coreg, Carvedilol  ?-holding entresto as above ?  ?Dementia ([place])- (present on admission) ?Continue Namenda and sertraline ?  ?Constipation ?Large BM 3/5 ?- cont miralax/senna ? ? ?DVT prophylaxis: SQ heparin ?Code Status: Full ?Family Communication: Son at bedside 3/8, sister at bedside 3/9, 3/10 ?Disposition Plan: Status is: Inpatient ?Remains inpatient appropriate because: Unsafe discharge plan.  Medically stable ? ? ?Level of care: Med-Surg ? ?Consultants:  ?Nephrology ? ?Procedures:  ?Tunneled dialysis catheter placement ? ?Antimicrobials: ?None ? ? ?Subjective: ?Seen and examined.  Resting in bed.  Appears fatigued.  No pain complaints.  No family at bedside this morning ? ?Objective: ?Vitals:  ? 01/15/22 1531 01/16/22 0116 01/16/22 6283  01/16/22 0801  ?BP: 124/70 139/71 (!) 120/56 (!) 165/81  ?Pulse: 69 68 74 70  ?Resp:  18 17 16   ?Temp: 98.5 ?F (36.9 ?C) 99.1 ?F (37.3 ?C) 98.9 ?F (37.2 ?C) 97.7 ?F (36.5 ?C)  ?TempSrc:  Oral Oral Oral  ?SpO2: 100% 100% 99% 100%  ?Weight:      ?Height:      ? ? ?Intake/Output Summary (Last 24 hours) at 01/16/2022 1054 ?Last data filed at 01/16/2022 1047 ?Gross per 24 hour  ?Intake 180 ml  ?Output --  ?Net 180 ml  ? ?Filed Weights  ? 01/11/22 1155 01/14/22 0902 01/14/22 1202  ?Weight: 70.8 kg 71.2 kg 69.3 kg  ? ? ?Examination: ? ?General exam: No acute distress.  Appears fatigued ?Respiratory system: Clear to auscultation. Respiratory effort normal. ?Cardiovascular system: S1-S2, RRR, no murmurs, no pedal edema ?Gastrointestinal system: Soft, NT/ND, normal bowel sounds ?Central nervous system: Alert and oriented. No focal neurological deficits. ?Extremities: Symmetric 5 x 5 power. ?Skin: No rashes, lesions or ulcers ?Psychiatry: Judgement and insight appear normal. Mood & affect appropriate.  ? ? ? ?Data Reviewed: I have personally reviewed following labs and imaging studies ? ?CBC: ?Recent Labs  ?Lab 01/10/22 ?0450 01/14/22 ?0542  ?WBC 10.8* 8.2  ?HGB 8.5* 7.9*  ?HCT 25.7* 24.1*  ?MCV 98.1 98.0  ?PLT 236 237  ? ?Basic Metabolic Panel: ?Recent Labs  ?Lab 01/11/22 ?3244 01/12/22 ?0102 01/13/22 ?0432 01/14/22 ?7253 01/15/22 ?6644  ?NA 137 137 136 136 134*  ?K 3.8 3.9 3.7 3.8 3.6  ?CL 100 96* 97* 98 95*  ?CO2 28 30 31 30  33*  ?GLUCOSE 113* 83 90 92 107*  ?BUN 28* 16 24* 25* 14  ?CREATININE 6.27* 4.36* 5.60* 5.65* 3.78*  ?CALCIUM 8.8* 8.9 8.4* 8.5* 8.4*  ? ?GFR: ?Estimated Creatinine Clearance: 20.4 mL/min (A) (by C-G formula based on SCr of 3.78 mg/dL (H)). ?Liver Function Tests: ?No results for input(s): AST, ALT, ALKPHOS, BILITOT, PROT, ALBUMIN in the last 168 hours. ?No results for input(s): LIPASE, AMYLASE in the last 168 hours. ?No results for input(s): AMMONIA in the last 168 hours. ?Coagulation Profile: ?No results  for input(s): INR, PROTIME in the last 168 hours. ?Cardiac Enzymes: ?No results for input(s): CKTOTAL, CKMB, CKMBINDEX, TROPONINI in the last 168 hours. ?BNP (last 3 results) ?No results for input(s): PROBNP in the last 8760 hours. ?HbA1C: ?No results for input(s): HGBA1C in the last 72 hours. ?CBG: ?No results for input(s): GLUCAP in the last 168 hours. ?Lipid Profile: ?No results for input(s): CHOL, HDL, LDLCALC, TRIG, CHOLHDL, LDLDIRECT in the last 72 hours. ?Thyroid Function Tests: ?No results for input(s): TSH, T4TOTAL, FREET4, T3FREE, THYROIDAB in the last 72 hours. ?Anemia Panel: ?No results for input(s): VITAMINB12, FOLATE, FERRITIN, TIBC, IRON, RETICCTPCT in the last 72 hours. ?Sepsis Labs: ?No results for input(s): PROCALCITON, LATICACIDVEN in the last 168 hours. ? ?No results found for this or any previous visit (from the past 240 hour(s)).  ? ? ? ? ? ?Radiology Studies: ?No results found. ? ? ? ? ? ?Scheduled Meds: ? ARIPiprazole  1 mg Oral Daily  ? aspirin EC  81 mg Oral Daily  ? carvedilol  12.5 mg Oral BID WC  ? Chlorhexidine Gluconate Cloth  6 each Topical Q0600  ? diclofenac Sodium  2 g Topical QID  ? feeding supplement (NEPRO CARB STEADY)  237 mL Oral TID BM  ? heparin injection (subcutaneous)  5,000 Units Subcutaneous Q8H  ? multivitamin  1 tablet Oral QHS  ?  pantoprazole  40 mg Oral BID  ? polyethylene glycol  34 g Oral Daily  ? rosuvastatin  10 mg Oral Daily  ? senna  1 tablet Oral QHS  ? sertraline  100 mg Oral Daily  ? sodium bicarbonate  650 mg Oral TID  ? tamsulosin  0.4 mg Oral Daily  ? ?Continuous Infusions: ? ? LOS: 23 days  ? ? ? ? ?Sidney Ace, MD ?[facility] ? ? ?If 7PM-7AM, please contact night-coverage ? ?01/16/2022, 10:54 AM  ? ?

## 2022-01-16 NOTE — Progress Notes (Signed)
[facility]  ROUNDING NOTE   Subjective:  Pt was admitted to [facility] on 12/22/2021 with intractable vomiting on 12/22/2021 with intractable vomiting which has been going for weeks.  He noted to have mild leukocytosis and AKI on admission patient has a past medical history significant for dementia, hypertension, HFpEF, CKD 3a and CVA.  We were asked to see the patient for AKI with a significantly elevated BUN and creatinine  Patient currently resting quietly Voices frustration with staff Currently eating breakfast, poor appetite remains No family at bedside Denies shortness of breath Reports being able to ambulate around nurses station with assistance.  Creatinine 4.74 No urine output recorded  Objective:  Vital signs in last 24 hours:  Temp:  [97.7 F (36.5 C)-99.1 F (37.3 C)] 97.7 F (36.5 C) (03/13 0801) Pulse Rate:  [68-74] 70 (03/13 0801) Resp:  [16-18] 16 (03/13 0801) BP: (120-165)/(56-81) 165/81 (03/13 0801) SpO2:  [99 %-100 %] 100 % (03/13 0801)  Weight change:  Filed Weights   01/11/22 1155 01/14/22 0902 01/14/22 1202  Weight: 70.8 kg 71.2 kg 69.3 kg    Intake/Output: I/O last 3 completed shifts: In: 360 [P.O.:360] Out: 0    Intake/Output this shift:  No intake/output data recorded.  Physical Exam: General: No acute distress  Head: Moist oral mucosal membranes, hearing intact  Eyes: Anicteric  Lungs:  Respirations symmetrical ,unlabored, lungs clear to auscultation  Heart: S1S2 no rubs  Abdomen:  Soft, nontender  Extremities: No peripheral edema.  Neurologic: Awake,alert, able to answer simple questions  Skin: No acute rashes or lesions  Access: Rt IJ permcath placed by Dr Lucky Cowboy on 29/93/71    Basic Metabolic Panel: Recent Labs  Lab 01/12/22 0519 01/13/22 0432 01/14/22 0542 01/15/22 0547 01/16/22 1003  NA 137 136 136 134* 135  K 3.9 3.7 3.8 3.6 3.7  CL 96* 97* 98 95* 96*  CO2 30 31 30  33* 33*  GLUCOSE 83 90 92 107* 103*  BUN 16 24*  25* 14 22*  CREATININE 4.36* 5.60* 5.65* 3.78* 4.74*  CALCIUM 8.9 8.4* 8.5* 8.4* 8.5*  PHOS  --   --   --   --  3.1     Liver Function Tests: Recent Labs  Lab 01/16/22 1003  ALBUMIN 2.9*    No results for input(s): LIPASE, AMYLASE in the last 168 hours.  No results for input(s): AMMONIA in the last 168 hours.  CBC: Recent Labs  Lab 01/10/22 0450 01/14/22 0542  WBC 10.8* 8.2  HGB 8.5* 7.9*  HCT 25.7* 24.1*  MCV 98.1 98.0  PLT 236 237     Cardiac Enzymes: No results for input(s): CKTOTAL, CKMB, CKMBINDEX, TROPONINI in the last 168 hours.  BNP: Invalid input(s): POCBNP  CBG: No results for input(s): GLUCAP in the last 168 hours.   Microbiology: Results for orders placed or performed during the hospital encounter of 12/22/21  Resp Panel by RT-PCR (Flu A&B, Covid) Nasopharyngeal Swab     Status: None   Collection Time: 12/22/21 11:18 PM   Specimen: Nasopharyngeal Swab; Nasopharyngeal(NP) swabs in vial transport medium  Result Value Ref Range Status   SARS Coronavirus 2 by RT PCR NEGATIVE NEGATIVE Final    Comment: (NOTE) SARS-CoV-2 target nucleic acids are NOT DETECTED.  The SARS-CoV-2 RNA is generally detectable in upper respiratory specimens during the acute phase of infection. The lowest concentration of SARS-CoV-2 viral copies this assay can detect is 138 copies/mL. A negative result does not preclude SARS-Cov-2 infection and should  not be used as the sole basis for treatment or other patient management decisions. A negative result may occur with  improper specimen collection/handling, submission of specimen other than nasopharyngeal swab, presence of viral mutation(s) within the areas targeted by this assay, and inadequate number of viral copies(<138 copies/mL). A negative result must be combined with clinical observations, patient history, and epidemiological information. The expected result is Negative.  Fact Sheet for Patients:   EntrepreneurPulse.com.au  Fact Sheet for Healthcare Providers:  IncredibleEmployment.be  This test is no t yet approved or cleared by the [place] FDA and  has been authorized for detection and/or diagnosis of SARS-CoV-2 by FDA under an Emergency Use Authorization (EUA). This EUA will remain  in effect (meaning this test can be used) for the duration of the COVID-19 declaration under Section 564(b)(1) of the Act, 21 U.S.C.section 360bbb-3(b)(1), unless the authorization is terminated  or revoked sooner.       Influenza A by PCR NEGATIVE NEGATIVE Final   Influenza B by PCR NEGATIVE NEGATIVE Final    Comment: (NOTE) The Xpert Xpress SARS-CoV-2/FLU/RSV plus assay is intended as an aid in the diagnosis of influenza from Nasopharyngeal swab specimens and should not be used as a sole basis for treatment. Nasal washings and aspirates are unacceptable for Xpert Xpress SARS-CoV-2/FLU/RSV testing.  Fact Sheet for Patients: EntrepreneurPulse.com.au  Fact Sheet for Healthcare Providers: IncredibleEmployment.be  This test is not yet approved or cleared by the [place] FDA and has been authorized for detection and/or diagnosis of SARS-CoV-2 by FDA under an Emergency Use Authorization (EUA). This EUA will remain in effect (meaning this test can be used) for the duration of the COVID-19 declaration under Section 564(b)(1) of the Act, 21 U.S.C. section 360bbb-3(b)(1), unless the authorization is terminated or revoked.  Performed at [facility], [address] [place], [place] [address]     Coagulation Studies: No results for input(s): LABPROT, INR in the last 72 hours.  Urinalysis: No results for input(s): COLORURINE, LABSPEC, PHURINE, GLUCOSEU, HGBUR, BILIRUBINUR, KETONESUR, PROTEINUR, UROBILINOGEN, NITRITE, LEUKOCYTESUR in the last 72 hours.  Invalid input(s): APPERANCEUR      Imaging: No results found.   Medications:      ARIPiprazole  1 mg Oral Daily   aspirin EC  81 mg Oral Daily   carvedilol  12.5 mg Oral BID WC   Chlorhexidine Gluconate Cloth  6 each Topical Q0600   diclofenac Sodium  2 g Topical QID   feeding supplement (NEPRO CARB STEADY)  237 mL Oral TID BM   heparin injection (subcutaneous)  5,000 Units Subcutaneous Q8H   multivitamin  1 tablet Oral QHS   pantoprazole  40 mg Oral BID   polyethylene glycol  34 g Oral Daily   rosuvastatin  10 mg Oral Daily   senna  1 tablet Oral QHS   sertraline  100 mg Oral Daily   sodium bicarbonate  650 mg Oral TID   tamsulosin  0.4 mg Oral Daily   acetaminophen **OR** acetaminophen, hydrALAZINE, ondansetron **OR** ondansetron (ZOFRAN) IV, ondansetron (ZOFRAN) IV, sorbitol, milk of mag, mineral oil, glycerin (SMOG) enema  Assessment/ Plan:  61 y.o. male with a past medical history significant for dementia, hypertension, heart failure, CKD 3a and CVA admitted to [facility] on 12/22/2021 with intractable vomiting.  Also noted to have mild leukocytosis and AKI.   #1  End-stage renal disease requiring hemodialysis.  Due to patient's lack of sustainable renal recovery, decreased urine output, we feel patient has now progressed  to end-stage renal disease. Patient continues to show little renal recovery during his hospitalization.  Patient is currently receiving dialysis at least twice weekly with fluctuating urination. Outpatient dialysis clinic search remains in progress.  Lab Results  Component Value Date   CREATININE 4.74 (H) 01/16/2022   CREATININE 3.78 (H) 01/15/2022   CREATININE 5.65 (H) 01/14/2022    #2 Recurrent vomiting  Likely multifactorial including advancing dementia ,medication side effect or functional dyspepsia.  GI is consulted.  EGD completed with no signs of active bleeding.  Did see gastritis, small hiatal hernia and LA grade B reflux esophagitis.   Currently prescribed pantoprazole 40 mg PO  BID  3. Anemia of chronic kidney disease  Normocytic Lab Results  Component Value Date   HGB 7.9 (L) 01/14/2022  Hgb remains decreased. Will order EPO with treatments.   4.   Hypertension, well controlled.  Blood pressure currently 165/81  Continue carvedilol    LOS: [place] 3/13/202312:50 PM

## 2022-01-16 NOTE — Progress Notes (Signed)
Mobility Specialist - Progress Note ? ? 01/16/22 1500  ?Mobility  ?Activity Refused mobility  ? ? ? ?Pt eating lunch on arrival, requesting to return later this date. Did re-attempt, however pt had just finished ambulating with PT.  ? ? ?Kathee Delton ?Mobility Specialist ?01/16/22, 3:20 PM ? ? ? ? ?

## 2022-01-17 DIAGNOSIS — R1110 Vomiting, unspecified: Secondary | ICD-10-CM | POA: Diagnosis not present

## 2022-01-17 LAB — CBC
HCT: 24.4 % — ABNORMAL LOW (ref 39.0–52.0)
Hemoglobin: 8.1 g/dL — ABNORMAL LOW (ref 13.0–17.0)
MCH: 32.7 pg (ref 26.0–34.0)
MCHC: 33.2 g/dL (ref 30.0–36.0)
MCV: 98.4 fL (ref 80.0–100.0)
Platelets: 262 10*3/uL (ref 150–400)
RBC: 2.48 MIL/uL — ABNORMAL LOW (ref 4.22–5.81)
RDW: 12.1 % (ref 11.5–15.5)
WBC: 8.8 10*3/uL (ref 4.0–10.5)
nRBC: 0 % (ref 0.0–0.2)

## 2022-01-17 LAB — RENAL FUNCTION PANEL
Albumin: 3.1 g/dL — ABNORMAL LOW (ref 3.5–5.0)
Anion gap: 8 (ref 5–15)
BUN: 27 mg/dL — ABNORMAL HIGH (ref 6–20)
CO2: 29 mmol/L (ref 22–32)
Calcium: 8.7 mg/dL — ABNORMAL LOW (ref 8.9–10.3)
Chloride: 98 mmol/L (ref 98–111)
Creatinine, Ser: 4.92 mg/dL — ABNORMAL HIGH (ref 0.61–1.24)
GFR, Estimated: 13 mL/min — ABNORMAL LOW (ref >60)
Glucose, Bld: 84 mg/dL (ref 70–99)
Phosphorus: 3.9 mg/dL (ref 2.5–4.6)
Potassium: 4 mmol/L (ref 3.5–5.1)
Sodium: 135 mmol/L (ref 135–145)

## 2022-01-17 MED ORDER — SODIUM BICARBONATE 650 MG PO TABS
650.0000 mg | ORAL_TABLET | Freq: Three times a day (TID) | ORAL | 0 refills | Status: AC
Start: 2022-01-17 — End: 2022-02-16

## 2022-01-17 MED ORDER — EPOETIN ALFA 10000 UNIT/ML IJ SOLN
10000.0000 [IU] | INTRAMUSCULAR | Status: DC
  Administered 2022-01-17: 10000 [IU] via INTRAVENOUS

## 2022-01-17 MED ORDER — EPOETIN ALFA 10000 UNIT/ML IJ SOLN: 10000.0000 [IU] | INTRAMUSCULAR | Status: DC

## 2022-01-17 MED ORDER — ROSUVASTATIN CALCIUM 10 MG PO TABS: 10.0000 mg | ORAL_TABLET | Freq: Every day | ORAL | 0 refills | Status: DC

## 2022-01-17 MED ORDER — EPOETIN ALFA 10000 UNIT/ML IJ SOLN
INTRAMUSCULAR | Status: AC
  Filled 2022-01-17: qty 1

## 2022-01-17 MED ORDER — ARIPIPRAZOLE 2 MG PO TABS: 1.0000 mg | ORAL_TABLET | Freq: Every day | ORAL | 0 refills | Status: DC

## 2022-01-17 MED ORDER — PANTOPRAZOLE SODIUM 40 MG PO TBEC: 40.0000 mg | DELAYED_RELEASE_TABLET | Freq: Every day | ORAL | 0 refills | Status: DC

## 2022-01-17 MED ORDER — HEPARIN SODIUM (PORCINE) 1000 UNIT/ML IJ SOLN
INTRAMUSCULAR | Status: AC
  Filled 2022-01-17: qty 10

## 2022-01-17 NOTE — Progress Notes (Signed)
PLACEMENT RESOLVED: ?[facility] MWF 10:30am, ?Start date 3/17 @ 9:45am. ?

## 2022-01-17 NOTE — TOC Transition Note (Signed)
Transition of Care (TOC) - CM/SW Discharge Note ? ? ?Patient Details  ?Name: ALSON MCPHEETERS ?MRN: [id number] ?Date of Birth: June 07, 1961 ? ?Transition of Care (TOC) CM/SW Contact:  ?Beverly Sessions, RN ?Phone Number: ?01/17/2022, 2:14 PM ? ? ?Clinical Narrative:    ? ?Patient to discharge today  ?Sister in room and will transport at discharge  ?Corene Cornea with [facility] notified of discharge  ?Elvera Bicker dialysis liaison notified of discharge ?Estill Bamberg has notified sister when patient's outpatient HD is scheduled ? ? ?  ?  ? ? ?Patient Goals and CMS Choice ?  ?  ?  ? ?Discharge Placement ?  ?           ?  ?  ?  ?  ? ?Discharge Plan and Services ?  ?  ?           ?DME Arranged: 3-N-1, Walker rolling ?DME Agency: [employer] ?Date DME Agency Contacted: 12/24/21 ?Time DME Agency Contacted: [phone] ?Representative spoke with at DME Agency: Brenton Grills ?  ?  ?  ?  ?  ? ?Social Determinants of Health (SDOH) Interventions ?  ? ? ?Readmission Risk Interventions ?No flowsheet data found. ? ? ? ? ?

## 2022-01-17 NOTE — Progress Notes (Signed)
PT Cancellation Note ? ?Patient Details ?Name: Randall [name] ?MRN: [id number] ?DOB: Jun 13, 1961 ? ? ?Cancelled Treatment:    Reason Eval/Treat Not Completed: Patient at procedure or test/unavailable. Pt off unit for HD. Will re-attempt at later time/date as medically appropriate and available. ? ? ?Salem Caster. Fairly IV, PT, DPT ?Physical Therapist- [place]  ?[facility]  ?01/17/2022, 10:45 AM ?

## 2022-01-17 NOTE — Progress Notes (Signed)
PT AVS reviewed with pt and pt sister. They both verbalized understanding of all DC teaching and instructions. PT DME has been delivered. PT will be going home with sister as transportation and has all pt belongings in his possession.  ?

## 2022-01-17 NOTE — Progress Notes (Signed)
Mobility Specialist - Progress Note ? ? 01/17/22 1400  ?Mobility  ?Activity Ambulated with assistance in room;Stood at bedside;Dangled on edge of bed  ?Level of Assistance Standby assist, set-up cues, supervision of patient - no hands on  ?Assistive Device None  ?Distance Ambulated (ft) 5 ft  ?Activity Response Tolerated well  ?$Mobility charge 1 Mobility  ? ? ? ?Pt participated in UB/LB dressing EOB with supervision. Able to don socks and shoes in figure 4 position. Pt ambulated to sink for deodorant. Pt left EOB with alarm set, needs in reach.  ? ? ?Kathee Delton ?Mobility Specialist ?01/17/22, 2:40 PM ? ? ? ? ?

## 2022-01-17 NOTE — Discharge Summary (Signed)
Physician Discharge Summary  ?Randall [name] [id number]:[id number] DOB: [birth year] DOA: 12/22/2021 ? ?PCP: Marlowe Aschoff, MD ? ?Admit date: 12/22/2021 ?Discharge date: 01/17/2022 ? ?Admitted From: Home ?Disposition: Home ? ?Recommendations for Outpatient Follow-up:  ?Follow up with PCP in 1-2 weeks ? ? ?Home Health: No ?Equipment/Devices: None ? ?Discharge Condition: Stable ?CODE STATUS: DNR ?Diet recommendation: Regular ? ?Brief/Interim Summary: ?Patient initially admitted to [facility] on 2/16 with chief complaint of intractable nausea and vomiting progressing over the past several weeks.  Patient had significant AKI on admission.  History of HFpEF, CKD stage IIIa and CVA.  Nephrology involved.  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  Started on intermittent hemodialysis. ?  ?Discharge delayed by lack of safe disposition plan.  Insurance barriers to establishing outpatient hemodialysis.  Per dialysis coordinator patient was assigned a outpatient HD chair time on 3/14.  He is discharged home in stable condition.  After discussion with bedside RN and the patient patient wishes to be a DNR.  This has been reflected in his epic orders and DNR form has been signed and left in chart ?  ? ? ? ?Discharge Diagnoses:  ?Principal Problem: ?  Recurrent vomiting ?Active Problems: ?  Leukocytosis ?  Dementia ([place]) ?  Chronic systolic CHF (congestive heart failure) ([place]) ?  History of CVA (cerebrovascular accident) ?  Acute renal failure superimposed on stage 3a chronic kidney disease ([place]) ?  Moderate major depression ([place]) ?  Intractable nausea and vomiting ?  Essential hypertension ? ?Acute renal failure superimposed on stage 3a chronic kidney disease ([place]) ?Good urinary output though doesn't appear to be accurately recorded by nursing. ?Nephrology was consulted and they decided to proceed with hemodialysis.  Received 3 sessions of initial dialysis with temporary catheter which was then switched with permanent cath.  GFR remains below 15 ?- next dialysis planned for 3/8, is getting dialyzed intermittently. Nephrology at work on outpatient chair ?- last dialysis was 3/8 ?- has right IJ tunneled dialysis catheter placed by vascular surgery on 2/24 ?Plan: ?Dialysis coordinator established outpatient HD chair time.  Patient discharged home in stable condition.  Was given instructions on wearing 1 to present for first outpatient hemodialysis ?  ?Recurrent vomiting- (present on admission) ?resolved. ?Recurrent episodes with second hospitalization in 2 weeks for the same ?No definite etiology identified. ?GI was consulted and patient underwent EGD on 12/26/2020 which shows esophagitis, some esophageal dysmotility, gastritis and few nonbleeding erosions in duodenum, biopsies were taken, which were negative. Vomiting and nausea resolved, tolerating diet ?-Continue with Protonix ?-Okay for diet as tolerated ?  ?Anemia ?Hgb 8s from 11s on admission.  ?No hematochezia or melena or other bleeding.  ?Likely 2/2 new renal dysfunction ?-Stable at time of discharge ?  ?Hypokalemia ?Resolved w/ supplementation ?  ?Essential hypertension- (present on admission) ?Blood pressure improved after adding hydralazine. ?-Hold Entresto as advised by nephrology while waiting for any renal function recovery. ?-Continue amlodipine  ?-Continue hydralazine ? ?  ?Moderate major depression ([place]) ?- Continue Abilify and Zoloft ?  ?  ?History of CVA (cerebrovascular accident) ?Continue aspirin and statin ?  ?Chronic diastolic CHF (congestive heart failure) ([place])- (present on admission) ?Currently euvolemic. Ef 50% on 2021 TTE w/ grade 1 dysfunction ?-Continue Coreg, Carvedilol  ?-holding entresto as above ?  ?Dementia ([place])- (present on admission) ?Continue Namenda and sertraline ?  ?Constipation ?Large BM 3/5 ?- cont miralax/senna ? ?Discharge Instructions ? ?Discharge Instructions   ? ? Diet - low sodium heart healthy  Complete by: As directed ?  ? Increase  activity slowly   Complete by: As directed ?  ? ?  ? ?Allergies as of 01/17/2022   ? ?   Reactions  ? Penicillins   ? Reaction in childhood  ? ?  ? ?  ?Medication List  ?  ? ?STOP taking these medications   ? ?amLODipine 10 MG tablet ?Commonly known as: NORVASC ?  ?sacubitril-valsartan 97-103 MG ?Commonly known as: ENTRESTO ?  ? ?  ? ?TAKE these medications   ? ?ARIPiprazole 2 MG tablet ?Commonly known as: ABILIFY ?Take 0.5 tablets (1 mg total) by mouth daily. ?  ?aspirin 81 MG EC tablet ?Take 81 mg by mouth daily. ?  ?carvedilol 25 MG tablet ?Commonly known as: COREG ?Take 25 mg by mouth 2 (two) times daily. ?  ?cyanocobalamin 1000 MCG tablet ?Take 1,000 mcg by mouth daily. ?  ?memantine 5 MG tablet ?Commonly known as: NAMENDA ?Take as directed by your doctor. ?  ?pantoprazole 40 MG tablet ?Commonly known as: PROTONIX ?Take 1 tablet (40 mg total) by mouth daily. ?  ?rosuvastatin 10 MG tablet ?Commonly known as: CRESTOR ?Take 1 tablet (10 mg total) by mouth daily. ?What changed:  ?medication strength ?how much to take ?  ?sertraline 100 MG tablet ?Commonly known as: ZOLOFT ?Take 100 mg by mouth daily. ?  ?sodium bicarbonate 650 MG tablet ?Take 1 tablet (650 mg total) by mouth 3 (three) times daily. ?  ?tamsulosin 0.4 MG Caps capsule ?Commonly known as: FLOMAX ?Take 0.4 mg by mouth daily. ?  ? ?  ? ?  ?  ? ? ?  ?Durable Medical Equipment  ?(From admission, onward)  ?  ? ? ?  ? ?  Start     Ordered  ? 12/24/21 1515  For home use only DME Walker rolling  Once       ?Question Answer Comment  ?Walker: With 5 Inch Wheels   ?Patient needs a walker to treat with the following condition AMS (altered mental status)   ?  ? 12/24/21 1514  ? 12/24/21 1515  For home use only DME Bedside commode  Once       ?Question:  Patient needs a bedside commode to treat with the following condition  Answer:  AMS (altered mental status)  ? 12/24/21 1514  ? ?  ?  ? ?  ? ? ?Allergies  ?Allergen Reactions  ? Penicillins   ?  Reaction in  childhood ?  ? ? ?Consultations: ?Nephrology ? ? ?Procedures/Studies: ?CT ABDOMEN PELVIS WO CONTRAST ? ?Result Date: 12/22/2021 ?CLINICAL DATA:  Abdominal pain, acute, nonlocalized EXAM: CT ABDOMEN AND PELVIS WITHOUT CONTRAST TECHNIQUE: Multidetector CT imaging of the abdomen and pelvis was performed following the standard protocol without IV contrast. RADIATION DOSE REDUCTION: This exam was performed according to the departmental dose-optimization program which includes automated exposure control, adjustment of the mA and/or kV according to patient size and/or use of iterative reconstruction technique. COMPARISON:  None. FINDINGS: Lower chest: No acute abnormality.  Possible tiny hiatal hernia. Hepatobiliary: No focal liver abnormality. No gallstones, gallbladder wall thickening, or pericholecystic fluid. No biliary dilatation. Pancreas: No focal lesion. Normal pancreatic contour. No surrounding inflammatory changes. No main pancreatic ductal dilatation. Spleen: Normal in size without focal abnormality. Adrenals/Urinary Tract: No adrenal nodule bilaterally. No nephrolithiasis and no hydronephrosis. No definite contour-deforming renal mass. No ureterolithiasis or hydroureter. The urinary bladder is unremarkable. Stomach/Bowel: Stomach is within normal limits. No evidence of bowel wall  thickening or dilatation. Stool throughout the colon. The appendix is not definitely identified with no inflammatory changes in the right lower quadrant to suggest acute appendicitis. Vascular/Lymphatic: No abdominal aorta or iliac aneurysm. At least moderate atherosclerotic plaque of the aorta and its branches. No abdominal, pelvic, or inguinal lymphadenopathy. Reproductive: Prostate is unremarkable. Other: No intraperitoneal free fluid. No intraperitoneal free gas. No organized fluid collection. Musculoskeletal: No abdominal wall hernia or abnormality. No suspicious lytic or blastic osseous lesions. No acute displaced fracture.  Multilevel degenerative changes of the spine. IMPRESSION: 1. Possible tiny hiatal hernia. 2. Constipation. 3. Otherwise no acute intra-abdominal or intrapelvic abnormality. 4.  Aortic Atherosclerosis (ICD10-I70.0). Elect

## 2022-01-17 NOTE — Progress Notes (Signed)
?Randall [name]  ?ROUNDING NOTE  ? ?Subjective:  ?Pt was admitted to [facility] on 12/22/2021 with intractable vomiting on 12/22/2021 with intractable vomiting which has been going for weeks.  He noted to have mild leukocytosis and AKI on admission patient has a past medical history significant for dementia, hypertension, HFpEF, CKD 3a and CVA.  We were asked to see the patient for AKI with a significantly elevated BUN and creatinine ? ?Patient seen resting quietly with sister at bedside ?Appetite remains poor ?Plan for dialysis later this morning ? ?Patient seen later during dialysis ?  ?HEMODIALYSIS FLOWSHEET: ? ?Blood Flow Rate (mL/min): 400 mL/min ?Arterial Pressure (mmHg): -180 mmHg ?Venous Pressure (mmHg): 100 mmHg ?Transmembrane Pressure (mmHg): 70 mmHg ?Ultrafiltration Rate (mL/min): 170 mL/min ?Dialysate Flow Rate (mL/min): 500 ml/min ?Conductivity: Machine : 13.6 ?Conductivity: Machine : 13.6 ?Dialysis Fluid Bolus: Normal Saline ?Bolus Amount (mL): 250 mL ? ? ? ?Objective:  ?Vital signs in last 24 hours:  ?Temp:  [97.6 ?F (36.4 ?C)-98.8 ?F (37.1 ?C)] 98.8 ?F (37.1 ?C) (03/14 0920) ?Pulse Rate:  [63-69] 63 (03/14 1000) ?Resp:  [14-18] 14 (03/14 1000) ?BP: (157-191)/(68-82) 191/82 (03/14 1000) ?SpO2:  [99 %-100 %] 99 % (03/14 0753) ?Weight:  [73.3 kg] 73.3 kg (03/14 0920) ? ?Weight change:  ?Filed Weights  ? 01/14/22 0902 01/14/22 1202 01/17/22 0920  ?Weight: 71.2 kg 69.3 kg 73.3 kg  ? ? ?Intake/Output: ?I/O last 3 completed shifts: ?In: 37 [P.O.:420] ?Out: 500 [Urine:500] ?  ?Intake/Output this shift: ? No intake/output data recorded. ? ?Physical Exam: ?General: No acute distress  ?Head: Moist oral mucosal membranes, hearing intact  ?Eyes: Anicteric  ?Lungs:  Respirations symmetrical ,unlabored, lungs clear to auscultation  ?Heart: S1S2 no rubs  ?Abdomen:  Soft, nontender  ?Extremities: No peripheral edema.  ?Neurologic: Awake,alert, able to answer simple questions  ?Skin: No acute rashes or lesions   ?Access: Rt IJ permcath placed by Dr Lucky Cowboy on 12/30/21  ? ? ?Basic Metabolic Panel: ?Recent Labs  ?Lab 01/13/22 ?0432 01/14/22 ?5027 01/15/22 ?7412 01/16/22 ?1003 01/17/22 ?0454  ?NA 136 136 134* 135 135  ?K 3.7 3.8 3.6 3.7 4.0  ?CL 97* 98 95* 96* 98  ?CO2 31 30 33* 33* 29  ?GLUCOSE 90 92 107* 103* 84  ?BUN 24* 25* 14 22* 27*  ?CREATININE 5.60* 5.65* 3.78* 4.74* 4.92*  ?CALCIUM 8.4* 8.5* 8.4* 8.5* 8.7*  ?PHOS  --   --   --  3.1 3.9  ? ? ? ?Liver Function Tests: ?Recent Labs  ?Lab 01/16/22 ?1003 01/17/22 ?0454  ?ALBUMIN 2.9* 3.1*  ? ? ?No results for input(s): LIPASE, AMYLASE in the last 168 hours. ? ?No results for input(s): AMMONIA in the last 168 hours. ? ?CBC: ?Recent Labs  ?Lab 01/14/22 ?8786 01/17/22 ?0454  ?WBC 8.2 8.8  ?HGB 7.9* 8.1*  ?HCT 24.1* 24.4*  ?MCV 98.0 98.4  ?PLT 237 262  ? ? ? ?Cardiac Enzymes: ?No results for input(s): CKTOTAL, CKMB, CKMBINDEX, TROPONINI in the last 168 hours. ? ?BNP: ?Invalid input(s): POCBNP ? ?CBG: ?No results for input(s): GLUCAP in the last 168 hours. ? ? ?Microbiology: ?Results for orders placed or performed during the hospital encounter of 12/22/21  ?Resp Panel by RT-PCR (Flu A&B, Covid) Nasopharyngeal Swab     Status: None  ? Collection Time: 12/22/21 11:18 PM  ? Specimen: Nasopharyngeal Swab; Nasopharyngeal(NP) swabs in vial transport medium  ?Result Value Ref Range Status  ? SARS Coronavirus 2 by RT PCR NEGATIVE NEGATIVE Final  ?  Comment: (NOTE) ?SARS-CoV-2 target nucleic acids are NOT DETECTED. ? ?The SARS-CoV-2 RNA is generally detectable in upper respiratory ?specimens during the acute phase of infection. The lowest ?concentration of SARS-CoV-2 viral copies this assay can detect is ?138 copies/mL. A negative result does not preclude SARS-Cov-2 ?infection and should not be used as the sole basis for treatment or ?other patient management decisions. A negative result may occur with  ?improper specimen collection/handling, submission of specimen other ?than  nasopharyngeal swab, presence of viral mutation(s) within the ?areas targeted by this assay, and inadequate number of viral ?copies(<138 copies/mL). A negative result must be combined with ?clinical observations, patient history, and epidemiological ?information. The expected result is Negative. ? ?Fact Sheet for Patients:  ?EntrepreneurPulse.com.au ? ?Fact Sheet for Healthcare Providers:  ?IncredibleEmployment.be ? ?This test is no t yet approved or cleared by the [place] FDA and  ?has been authorized for detection and/or diagnosis of SARS-CoV-2 by ?FDA under an Emergency Use Authorization (EUA). This EUA will remain  ?in effect (meaning this test can be used) for the duration of the ?COVID-19 declaration under Section 564(b)(1) of the Act, 21 ?U.S.C.section 360bbb-3(b)(1), unless the authorization is terminated  ?or revoked sooner.  ? ? ?  ? Influenza A by PCR NEGATIVE NEGATIVE Final  ? Influenza B by PCR NEGATIVE NEGATIVE Final  ?  Comment: (NOTE) ?The Xpert Xpress SARS-CoV-2/FLU/RSV plus assay is intended as an aid ?in the diagnosis of influenza from Nasopharyngeal swab specimens and ?should not be used as a sole basis for treatment. Nasal washings and ?aspirates are unacceptable for Xpert Xpress SARS-CoV-2/FLU/RSV ?testing. ? ?Fact Sheet for Patients: ?EntrepreneurPulse.com.au ? ?Fact Sheet for Healthcare Providers: ?IncredibleEmployment.be ? ?This test is not yet approved or cleared by the [place] FDA and ?has been authorized for detection and/or diagnosis of SARS-CoV-2 by ?FDA under an Emergency Use Authorization (EUA). This EUA will remain ?in effect (meaning this test can be used) for the duration of the ?COVID-19 declaration under Section 564(b)(1) of the Act, 21 U.S.C. ?section 360bbb-3(b)(1), unless the authorization is terminated or ?revoked. ? ?Performed at [facility], [place], ?[place]  [address] ?  ? ? ?Coagulation Studies: ?No results for input(s): LABPROT, INR in the last 72 hours. ? ?Urinalysis: ?No results for input(s): COLORURINE, LABSPEC, [place], GLUCOSEU, HGBUR, BILIRUBINUR, KETONESUR, PROTEINUR, UROBILINOGEN, NITRITE, LEUKOCYTESUR in the last 72 hours. ? ?Invalid input(s): APPERANCEUR ?  ? ? ?Imaging: ?No results found. ? ? ?Medications:  ? ? ? ? ARIPiprazole  1 mg Oral Daily  ? aspirin EC  81 mg Oral Daily  ? carvedilol  12.5 mg Oral BID WC  ? Chlorhexidine Gluconate Cloth  6 each Topical Q0600  ? diclofenac Sodium  2 g Topical QID  ? feeding supplement (NEPRO CARB STEADY)  237 mL Oral TID BM  ? heparin injection (subcutaneous)  5,000 Units Subcutaneous Q8H  ? heparin sodium (porcine)      ? multivitamin  1 tablet Oral QHS  ? pantoprazole  40 mg Oral BID  ? polyethylene glycol  34 g Oral Daily  ? rosuvastatin  10 mg Oral Daily  ? senna  1 tablet Oral QHS  ? sertraline  100 mg Oral Daily  ? sodium bicarbonate  650 mg Oral TID  ? tamsulosin  0.4 mg Oral Daily  ? ?acetaminophen **OR** acetaminophen, hydrALAZINE, ondansetron **OR** ondansetron (ZOFRAN) IV, ondansetron (ZOFRAN) IV, sorbitol, milk of mag, mineral oil, glycerin (SMOG) enema ? ?Assessment/ Plan:  ?61 y.o. male  with a past medical history significant for dementia, hypertension, heart failure, CKD 3a and CVA admitted to [facility] on 12/22/2021 with intractable vomiting.  Also noted to have mild leukocytosis and AKI.  ? ?#1  End-stage renal disease requiring hemodialysis.  Due to patient's lack of sustainable renal recovery, decreased urine output, we feel patient has now progressed to end-stage renal disease. ?Creatinine continues to rise slowly between dialysis treatment, indicating lack of filtration.  Plan to dialyze patient later today, no UF.  Urine output recorded 500 mL overnight. ?Dialysis coordinator currently awaiting placement at [facility]. Next treatment scheduled for Thursday. ? ?Lab Results  ?Component Value Date  ?  CREATININE 4.92 (H) 01/17/2022  ? CREATININE 4.74 (H) 01/16/2022  ? CREATININE 3.78 (H) 01/15/2022  ?  ?#2 Recurrent vomiting  ?Likely multifactorial including advancing dementia ,medication side effect or functional dyspep

## 2022-03-02 DIAGNOSIS — R54 Age-related physical debility: Secondary | ICD-10-CM | POA: Insufficient documentation

## 2022-03-07 DIAGNOSIS — K219 Gastro-esophageal reflux disease without esophagitis: Secondary | ICD-10-CM | POA: Insufficient documentation

## 2022-03-07 DIAGNOSIS — K2980 Duodenitis without bleeding: Secondary | ICD-10-CM | POA: Insufficient documentation

## 2022-06-01 ENCOUNTER — Encounter (INDEPENDENT_AMBULATORY_CARE_PROVIDER_SITE_OTHER): Payer: Self-pay | Admitting: *Deleted

## 2022-06-05 ENCOUNTER — Other Ambulatory Visit (INDEPENDENT_AMBULATORY_CARE_PROVIDER_SITE_OTHER): Payer: Self-pay | Admitting: Nurse Practitioner

## 2022-06-05 DIAGNOSIS — N186 End stage renal disease: Secondary | ICD-10-CM

## 2022-06-06 ENCOUNTER — Ambulatory Visit (INDEPENDENT_AMBULATORY_CARE_PROVIDER_SITE_OTHER): Payer: Medicaid Other

## 2022-06-06 ENCOUNTER — Ambulatory Visit (INDEPENDENT_AMBULATORY_CARE_PROVIDER_SITE_OTHER): Payer: Medicaid Other | Admitting: Vascular Surgery

## 2022-06-06 ENCOUNTER — Encounter (INDEPENDENT_AMBULATORY_CARE_PROVIDER_SITE_OTHER): Payer: Self-pay | Admitting: Vascular Surgery

## 2022-06-06 VITALS — BP 148/82 | HR 65 | Resp 16 | Wt 182.6 lb

## 2022-06-06 DIAGNOSIS — N186 End stage renal disease: Secondary | ICD-10-CM

## 2022-06-06 DIAGNOSIS — E785 Hyperlipidemia, unspecified: Secondary | ICD-10-CM

## 2022-06-06 DIAGNOSIS — I10 Essential (primary) hypertension: Secondary | ICD-10-CM

## 2022-06-06 DIAGNOSIS — Z992 Dependence on renal dialysis: Secondary | ICD-10-CM | POA: Diagnosis not present

## 2022-06-06 NOTE — Assessment & Plan Note (Signed)
Noninvasive studies show normal arterial flow in both upper extremities with multiphasic waveforms and no stenosis.  Vein mapping demonstrates adequate upper arm cephalic vein and basilic vein on the right side, but on the left side the left cephalic vein is marginal at best and the left basilic vein is slightly larger but still not ideal for fistula creation. We discussed options for access creation today and patient and his family are agreeable to proceed with placing an access which would be a brachiocephalic AV fistula in the right arm.  The left arm would likely have to be a graft, and we discussed that fistulas tend to be more durable than grafts and that is the reason and rationale for trying to place a fistula if possible.  He is okay with using his dominant arm.  Risks and benefits were discussed with the patient and his family and they are agreeable to proceed.

## 2022-06-06 NOTE — Assessment & Plan Note (Signed)
A cause of his renal failure and blood pressure control important in reducing the progression of atherosclerotic disease. On appropriate oral medications.

## 2022-06-06 NOTE — Progress Notes (Signed)
MRN : [id number]  Randall [name] is a 61 y.o. (December 08, 1960) male who presents with chief complaint of  Chief Complaint  Patient presents with   New Patient (Initial Visit)    Ref lateef consult vein mapp evaluation for fistula placement  .  History of Present Illness: Patient returns today in follow up of his dialysis access on referral from his nephrologist.  About 6 months ago, we placed a PermCath while he was hospitalized with renal failure.  Since that time, he has been using his right jugular PermCath without any major issues or problems.  No fevers or chills.  No significant arm swelling or pain.  No dysfunction of the catheter.  He is now here to discuss his permanent dialysis access options.  He is right-hand dominant.  Noninvasive studies show normal arterial flow in both upper extremities with multiphasic waveforms and no stenosis.  Vein mapping demonstrates adequate upper arm cephalic vein and basilic vein on the right side, but on the left side the left cephalic vein is marginal at best and the left basilic vein is slightly larger but still not ideal for fistula creation.  Current Outpatient Medications  Medication Sig Dispense Refill   aspirin 81 MG EC tablet Take 81 mg by mouth daily.     carvedilol (COREG) 25 MG tablet Take 25 mg by mouth 2 (two) times daily.     cyanocobalamin 1000 MCG tablet Take 1,000 mcg by mouth daily.     pantoprazole (PROTONIX) 40 MG tablet Take 1 tablet (40 mg total) by mouth daily. 30 tablet 0   rosuvastatin (CRESTOR) 10 MG tablet Take 1 tablet (10 mg total) by mouth daily. 30 tablet 0   sertraline (ZOLOFT) 100 MG tablet Take 100 mg by mouth daily.     tamsulosin (FLOMAX) 0.4 MG CAPS capsule Take 0.4 mg by mouth daily.     ARIPiprazole (ABILIFY) 2 MG tablet Take 0.5 tablets (1 mg total) by mouth daily. 15 tablet 0   memantine (NAMENDA) 5 MG tablet Take as directed by your doctor. (Patient not taking: Reported on 06/06/2022)  0   No current  facility-administered medications for this visit.    Past Medical History:  Diagnosis Date   Dementia ([place])    Diabetes mellitus without complication ([place])    Hypertension    Hypertensive urgency 12/09/2021    Past Surgical History:  Procedure Laterality Date   DIALYSIS/PERMA CATHETER INSERTION N/A 12/30/2021   Procedure: DIALYSIS/PERMA CATHETER INSERTION;  Surgeon: Algernon Huxley, MD;  Location: [place] [facility];  Service: Cardiovascular;  Laterality: N/A;   ESOPHAGOGASTRODUODENOSCOPY (EGD) WITH PROPOFOL N/A 12/26/2021   Procedure: ESOPHAGOGASTRODUODENOSCOPY (EGD) WITH PROPOFOL;  Surgeon: Annamaria Helling, DO;  Location: [facility] ENDOSCOPY;  Service: Gastroenterology;  Laterality: N/A;   TEMPORARY DIALYSIS CATHETER N/A 12/27/2021   Procedure: TEMPORARY DIALYSIS CATHETER;  Surgeon: Katha Cabal, MD;  Location: [place] [facility];  Service: Cardiovascular;  Laterality: N/A;     Social History   Tobacco Use   Smoking status: Former    Packs/day: 0.50    Types: Cigarettes  Substance Use Topics   Alcohol use: Never   Drug use: Never      Family History No bleeding disorders, clotting disorders, autoimmune diseases or aneurysms  Allergies  Allergen Reactions   Penicillins     Reaction in childhood      REVIEW OF SYSTEMS (Negative unless checked)  Constitutional: [] Weight loss  [] Fever  [] Chills Cardiac: [] Chest pain   []   Chest pressure   [] Palpitations   [] Shortness of breath when laying flat   [] Shortness of breath at rest   [x] Shortness of breath with exertion. Vascular:  [] Pain in legs with walking   [] Pain in legs at rest   [] Pain in legs when laying flat   [] Claudication   [] Pain in feet when walking  [] Pain in feet at rest  [] Pain in feet when laying flat   [] History of DVT   [] Phlebitis   [] Swelling in legs   [] Varicose veins   [] Non-healing ulcers Pulmonary:   [] Uses home oxygen   [] Productive cough   [] Hemoptysis   [] Wheeze  [] COPD   [] Asthma Neurologic:   [] Dizziness  [] Blackouts   [] Seizures   [] History of stroke   [] History of TIA  [] Aphasia   [] Temporary blindness   [] Dysphagia   [] Weakness or numbness in arms   [] Weakness or numbness in legs Musculoskeletal:  [] Arthritis   [] Joint swelling   [] Joint pain   [] Low back pain Hematologic:  [] Easy bruising  [] Easy bleeding   [] Hypercoagulable state   [x] Anemic   Gastrointestinal:  [] Blood in stool   [] Vomiting blood  [] Gastroesophageal reflux/heartburn   [] Abdominal pain Genitourinary:  [x] Chronic kidney disease   [] Difficult urination  [] Frequent urination  [] Burning with urination   [] Hematuria Skin:  [] Rashes   [] Ulcers   [] Wounds Psychological:  [] History of anxiety   []  History of major depression.  Physical Examination  BP (!) 148/82 (BP Location: Right Arm)   Pulse 65   Resp 16   Wt 182 lb 9.6 oz (82.8 kg)   BMI 26.97 kg/m  Gen:  WD/WN, NAD. Appears older than stated age. Head: [place]/AT, No temporalis wasting. Ear/Nose/Throat: Hearing grossly intact, nares w/o erythema or drainage Eyes: Conjunctiva clear. Sclera non-icteric Neck: Supple.  Trachea midline Pulmonary:  Good air movement, no use of accessory muscles.  Cardiac: RRR, no JVD Vascular: permcath in place in right chest Vessel Right Left  Radial Palpable Palpable           Musculoskeletal: M/S 5/5 throughout.  No deformity or atrophy.  Neurologic: Sensation grossly intact in extremities.  Symmetrical.  Speech is fluent.  Psychiatric: Judgment intact, Mood & affect appropriate for pt's clinical situation. Dermatologic: No rashes or ulcers noted.  No cellulitis or open wounds.      Labs No results found for this or any previous visit (from the past 2160 hour(s)).  Radiology No results found.  Assessment/Plan  ESRD on dialysis ([place]) Noninvasive studies show normal arterial flow in both upper extremities with multiphasic waveforms and no stenosis.  Vein mapping demonstrates adequate upper arm cephalic vein and  basilic vein on the right side, but on the left side the left cephalic vein is marginal at best and the left basilic vein is slightly larger but still not ideal for fistula creation. We discussed options for access creation today and patient and his family are agreeable to proceed with placing an access which would be a brachiocephalic AV fistula in the right arm.  The left arm would likely have to be a graft, and we discussed that fistulas tend to be more durable than grafts and that is the reason and rationale for trying to place a fistula if possible.  He is okay with using his dominant arm.  Risks and benefits were discussed with the patient and his family and they are agreeable to proceed.  Essential hypertension A cause of his renal failure and blood pressure control important in  reducing the progression of atherosclerotic disease. On appropriate oral medications.   Hyperlipidemia lipid control important in reducing the progression of atherosclerotic disease. Continue statin therapy     [name], MD  06/06/2022 10:58 AM    This note was created with Dragon medical transcription system.  Any errors from dictation are purely unintentional

## 2022-06-06 NOTE — H&P (View-Only) (Signed)
MRN : [id number]  Randall [name] is a 61 y.o. ([birth year]) male who presents with chief complaint of  Chief Complaint  Patient presents with   New Patient (Initial Visit)    Ref lateef consult vein mapp evaluation for fistula placement  .  History of Present Illness: Patient returns today in follow up of his dialysis access on referral from his nephrologist.  About 6 months ago, we placed a PermCath while he was hospitalized with renal failure.  Since that time, he has been using his right jugular PermCath without any major issues or problems.  No fevers or chills.  No significant arm swelling or pain.  No dysfunction of the catheter.  He is now here to discuss his permanent dialysis access options.  He is right-hand dominant.  Noninvasive studies show normal arterial flow in both upper extremities with multiphasic waveforms and no stenosis.  Vein mapping demonstrates adequate upper arm cephalic vein and basilic vein on the right side, but on the left side the left cephalic vein is marginal at best and the left basilic vein is slightly larger but still not ideal for fistula creation.  Current Outpatient Medications  Medication Sig Dispense Refill   aspirin 81 MG EC tablet Take 81 mg by mouth daily.     carvedilol (COREG) 25 MG tablet Take 25 mg by mouth 2 (two) times daily.     cyanocobalamin 1000 MCG tablet Take 1,000 mcg by mouth daily.     pantoprazole (PROTONIX) 40 MG tablet Take 1 tablet (40 mg total) by mouth daily. 30 tablet 0   rosuvastatin (CRESTOR) 10 MG tablet Take 1 tablet (10 mg total) by mouth daily. 30 tablet 0   sertraline (ZOLOFT) 100 MG tablet Take 100 mg by mouth daily.     tamsulosin (FLOMAX) 0.4 MG CAPS capsule Take 0.4 mg by mouth daily.     ARIPiprazole (ABILIFY) 2 MG tablet Take 0.5 tablets (1 mg total) by mouth daily. 15 tablet 0   memantine (NAMENDA) 5 MG tablet Take as directed by your doctor. (Patient not taking: Reported on 06/06/2022)  0   No current  facility-administered medications for this visit.    Past Medical History:  Diagnosis Date   Dementia ([place])    Diabetes mellitus without complication ([place])    Hypertension    Hypertensive urgency 12/09/2021    Past Surgical History:  Procedure Laterality Date   DIALYSIS/PERMA CATHETER INSERTION N/A 12/30/2021   Procedure: DIALYSIS/PERMA CATHETER INSERTION;  Surgeon: Algernon Huxley, MD;  Location: [place] [facility];  Service: Cardiovascular;  Laterality: N/A;   ESOPHAGOGASTRODUODENOSCOPY (EGD) WITH PROPOFOL N/A 12/26/2021   Procedure: ESOPHAGOGASTRODUODENOSCOPY (EGD) WITH PROPOFOL;  Surgeon: Annamaria Helling, DO;  Location: [facility] ENDOSCOPY;  Service: Gastroenterology;  Laterality: N/A;   TEMPORARY DIALYSIS CATHETER N/A 12/27/2021   Procedure: TEMPORARY DIALYSIS CATHETER;  Surgeon: Katha Cabal, MD;  Location: [place] [facility];  Service: Cardiovascular;  Laterality: N/A;     Social History   Tobacco Use   Smoking status: Former    Packs/day: 0.50    Types: Cigarettes  Substance Use Topics   Alcohol use: Never   Drug use: Never      Family History No bleeding disorders, clotting disorders, autoimmune diseases or aneurysms  Allergies  Allergen Reactions   Penicillins     Reaction in childhood      REVIEW OF SYSTEMS (Negative unless checked)  Constitutional: [] Weight loss  [] Fever  [] Chills Cardiac: [] Chest pain   []   Chest pressure   [] Palpitations   [] Shortness of breath when laying flat   [] Shortness of breath at rest   [x] Shortness of breath with exertion. Vascular:  [] Pain in legs with walking   [] Pain in legs at rest   [] Pain in legs when laying flat   [] Claudication   [] Pain in feet when walking  [] Pain in feet at rest  [] Pain in feet when laying flat   [] History of DVT   [] Phlebitis   [] Swelling in legs   [] Varicose veins   [] Non-healing ulcers Pulmonary:   [] Uses home oxygen   [] Productive cough   [] Hemoptysis   [] Wheeze  [] COPD   [] Asthma Neurologic:   [] Dizziness  [] Blackouts   [] Seizures   [] History of stroke   [] History of TIA  [] Aphasia   [] Temporary blindness   [] Dysphagia   [] Weakness or numbness in arms   [] Weakness or numbness in legs Musculoskeletal:  [] Arthritis   [] Joint swelling   [] Joint pain   [] Low back pain Hematologic:  [] Easy bruising  [] Easy bleeding   [] Hypercoagulable state   [x] Anemic   Gastrointestinal:  [] Blood in stool   [] Vomiting blood  [] Gastroesophageal reflux/heartburn   [] Abdominal pain Genitourinary:  [x] Chronic kidney disease   [] Difficult urination  [] Frequent urination  [] Burning with urination   [] Hematuria Skin:  [] Rashes   [] Ulcers   [] Wounds Psychological:  [] History of anxiety   []  History of major depression.  Physical Examination  BP (!) 148/82 (BP Location: Right Arm)   Pulse 65   Resp 16   Wt 182 lb 9.6 oz (82.8 kg)   BMI 26.97 kg/m  Gen:  WD/WN, NAD. Appears older than stated age. Head: [place]/AT, No temporalis wasting. Ear/Nose/Throat: Hearing grossly intact, nares w/o erythema or drainage Eyes: Conjunctiva clear. Sclera non-icteric Neck: Supple.  Trachea midline Pulmonary:  Good air movement, no use of accessory muscles.  Cardiac: RRR, no JVD Vascular: permcath in place in right chest Vessel Right Left  Radial Palpable Palpable           Musculoskeletal: M/S 5/5 throughout.  No deformity or atrophy.  Neurologic: Sensation grossly intact in extremities.  Symmetrical.  Speech is fluent.  Psychiatric: Judgment intact, Mood & affect appropriate for pt's clinical situation. Dermatologic: No rashes or ulcers noted.  No cellulitis or open wounds.      Labs No results found for this or any previous visit (from the past 2160 hour(s)).  Radiology No results found.  Assessment/Plan  ESRD on dialysis ([place]) Noninvasive studies show normal arterial flow in both upper extremities with multiphasic waveforms and no stenosis.  Vein mapping demonstrates adequate upper arm cephalic vein and  basilic vein on the right side, but on the left side the left cephalic vein is marginal at best and the left basilic vein is slightly larger but still not ideal for fistula creation. We discussed options for access creation today and patient and his family are agreeable to proceed with placing an access which would be a brachiocephalic AV fistula in the right arm.  The left arm would likely have to be a graft, and we discussed that fistulas tend to be more durable than grafts and that is the reason and rationale for trying to place a fistula if possible.  He is okay with using his dominant arm.  Risks and benefits were discussed with the patient and his family and they are agreeable to proceed.  Essential hypertension A cause of his renal failure and blood pressure control important in  reducing the progression of atherosclerotic disease. On appropriate oral medications.   Hyperlipidemia lipid control important in reducing the progression of atherosclerotic disease. Continue statin therapy     [name], MD  06/06/2022 10:58 AM    This note was created with Dragon medical transcription system.  Any errors from dictation are purely unintentional

## 2022-06-06 NOTE — Assessment & Plan Note (Signed)
lipid control important in reducing the progression of atherosclerotic disease. Continue statin therapy  

## 2022-06-07 ENCOUNTER — Telehealth (INDEPENDENT_AMBULATORY_CARE_PROVIDER_SITE_OTHER): Payer: Self-pay

## 2022-06-07 NOTE — Telephone Encounter (Signed)
Spoke with the patient's sister and the patient is scheduled on [facility]/22 at [facility]. Pre-op phone call is on 06/13/22 between 8-1 pm. Pre-surgical instructions were discussed and will be mailed.

## 2022-06-09 ENCOUNTER — Telehealth (INDEPENDENT_AMBULATORY_CARE_PROVIDER_SITE_OTHER): Payer: Self-pay

## 2022-06-09 NOTE — Telephone Encounter (Signed)
ERROR

## 2022-06-12 ENCOUNTER — Other Ambulatory Visit (INDEPENDENT_AMBULATORY_CARE_PROVIDER_SITE_OTHER): Payer: Self-pay | Admitting: Nurse Practitioner

## 2022-06-12 DIAGNOSIS — N186 End stage renal disease: Secondary | ICD-10-CM

## 2022-06-13 ENCOUNTER — Encounter
Admission: RE | Admit: 2022-06-13 | Discharge: 2022-06-13 | Disposition: A | Discharge status: Home / Self Care | Payer: 59 | Source: Ambulatory Visit | Attending: Vascular Surgery | Admitting: Vascular Surgery

## 2022-06-13 ENCOUNTER — Other Ambulatory Visit: Payer: Self-pay

## 2022-06-13 ENCOUNTER — Encounter: Payer: Self-pay | Admitting: Urgent Care

## 2022-06-13 DIAGNOSIS — Z992 Dependence on renal dialysis: Secondary | ICD-10-CM | POA: Diagnosis not present

## 2022-06-13 DIAGNOSIS — N186 End stage renal disease: Secondary | ICD-10-CM | POA: Insufficient documentation

## 2022-06-13 DIAGNOSIS — Z01812 Encounter for preprocedural laboratory examination: Secondary | ICD-10-CM

## 2022-06-13 DIAGNOSIS — Z01818 Encounter for other preprocedural examination: Secondary | ICD-10-CM | POA: Diagnosis not present

## 2022-06-13 DIAGNOSIS — Z01810 Encounter for preprocedural cardiovascular examination: Secondary | ICD-10-CM | POA: Diagnosis not present

## 2022-06-13 HISTORY — DX: Anemia, unspecified: D64.9

## 2022-06-13 HISTORY — DX: Cerebral infarction, unspecified: I63.9

## 2022-06-13 HISTORY — DX: Chronic kidney disease, unspecified: N18.9

## 2022-06-13 HISTORY — DX: Gastro-esophageal reflux disease without esophagitis: K21.9

## 2022-06-13 HISTORY — DX: Anxiety disorder, unspecified: F41.9

## 2022-06-13 HISTORY — DX: Heart failure, unspecified: I50.9

## 2022-06-13 HISTORY — DX: Pneumonia, unspecified organism: J18.9

## 2022-06-13 HISTORY — DX: Unspecified osteoarthritis, unspecified site: M19.90

## 2022-06-13 HISTORY — DX: Cardiac murmur, unspecified: R01.1

## 2022-06-13 HISTORY — DX: Depression, unspecified: F32.A

## 2022-06-13 LAB — TYPE AND SCREEN
ABO/RH(D): A POS
Antibody Screen: NEGATIVE

## 2022-06-13 LAB — PROTIME-INR
INR: 1.1 (ref 0.8–1.2)
Prothrombin Time: 14.4 seconds (ref 11.4–15.2)

## 2022-06-13 LAB — APTT: aPTT: 33 seconds (ref 24–36)

## 2022-06-13 NOTE — Patient Instructions (Addendum)
Your procedure is scheduled on: 06/15/22 - Thursday Report to the Registration Desk on the 1st floor of the [facility]. To find out your arrival time, please call [phone] between 1PM - 3PM on: 06/14/22 - Wednesday If your arrival time is 6:00 am, do not arrive prior to that time as the [facility] entrance doors do not open until 6:00 am.  REMEMBER: Instructions that are not followed completely may result in serious medical risk, up to and including death; or upon the discretion of your surgeon and anesthesiologist your surgery may need to be rescheduled.  Do not eat food or drink any fluids after midnight the night before surgery.  No gum chewing, lozengers or hard candies.  TAKE THESE MEDICATIONS THE MORNING OF SURGERY WITH A SIP OF WATER:  - carvedilol (COREG)  - pantoprazole (PROTONIX) , (take one the night before and one on the morning of surgery - helps to prevent nausea after surgery.) - rosuvastatin (CRESTOR) - sertraline (ZOLOFT)    One week prior to surgery: Stop Anti-inflammatories (NSAIDS) such as Advil, Aleve, Ibuprofen, Motrin, Naproxen, Naprosyn and Aspirin based products such as Excedrin, Goodys Powder, BC Powder.  Stop ANY OVER THE COUNTER supplements until after surgery.  You may take Tylenol if needed for pain up until the day of surgery.  No Alcohol for 24 hours before or after surgery.  No Smoking including e-cigarettes for 24 hours prior to surgery.  No chewable tobacco products for at least 6 hours prior to surgery.  No nicotine patches on the day of surgery.  Do not use any "recreational" drugs for at least a week prior to your surgery.  Please be advised that the combination of cocaine and anesthesia may have negative outcomes, up to and including death. If you test positive for cocaine, your surgery will be cancelled.  On the morning of surgery brush your teeth with toothpaste and water, you may rinse your mouth with mouthwash if you wish. Do  not swallow any toothpaste or mouthwash.  Use CHG Soap or wipes as directed on instruction sheet.  Do not wear jewelry, make-up, hairpins, clips or nail polish.  Do not wear lotions, powders, or perfumes.   Do not shave body from the neck down 48 hours prior to surgery just in case you cut yourself which could leave a site for infection.  Also, freshly shaved skin may become irritated if using the CHG soap.  Contact lenses, hearing aids and dentures may not be worn into surgery.  Do not bring valuables to the hospital. [facility] is not responsible for any missing/lost belongings or valuables.   Notify your doctor if there is any change in your medical condition (cold, fever, infection).  Wear comfortable clothing (specific to your surgery type) to the hospital.  After surgery, you can help prevent lung complications by doing breathing exercises.  Take deep breaths and cough every 1-2 hours. Your doctor may order a device called an Incentive Spirometer to help you take deep breaths. When coughing or sneezing, hold a pillow firmly against your incision with both hands. This is called "splinting." Doing this helps protect your incision. It also decreases belly discomfort.  If you are being admitted to the hospital overnight, leave your suitcase in the car. After surgery it may be brought to your room.  If you are being discharged the day of surgery, you will not be allowed to drive home. You will need a responsible adult (18 years or older) to drive  you home and stay with you that night.   If you are taking public transportation, you will need to have a responsible adult (18 years or older) with you. Please confirm with your physician that it is acceptable to use public transportation.   Please call the [facility]. at [phone] if you have any questions about these instructions.  Surgery Visitation Policy:  Patients undergoing a surgery or procedure may have  two family members or support persons with them as long as the person is not COVID-19 positive or experiencing its symptoms.   Inpatient Visitation:    Visiting hours are 7 a.m. to 8 p.m. Up to four visitors are allowed at one time in a patient room, including children. The visitors may rotate out with other people during the day. One designated support person (adult) may remain overnight.

## 2022-06-15 ENCOUNTER — Encounter: Payer: Self-pay | Admitting: Vascular Surgery

## 2022-06-15 ENCOUNTER — Other Ambulatory Visit: Payer: Self-pay

## 2022-06-15 ENCOUNTER — Encounter
Admission: RE | Disposition: A | Discharge status: Home / Self Care | Payer: Self-pay | Source: Home / Self Care | Attending: Vascular Surgery

## 2022-06-15 ENCOUNTER — Ambulatory Visit
Admission: RE | Admit: 2022-06-15 | Discharge: 2022-06-15 | Disposition: A | Discharge status: Home / Self Care | Payer: 59 | Attending: Vascular Surgery | Admitting: Vascular Surgery

## 2022-06-15 ENCOUNTER — Ambulatory Visit: Payer: 59 | Admitting: Urgent Care

## 2022-06-15 DIAGNOSIS — I132 Hypertensive heart and chronic kidney disease with heart failure and with stage 5 chronic kidney disease, or end stage renal disease: Secondary | ICD-10-CM | POA: Diagnosis not present

## 2022-06-15 DIAGNOSIS — E1122 Type 2 diabetes mellitus with diabetic chronic kidney disease: Secondary | ICD-10-CM | POA: Diagnosis not present

## 2022-06-15 DIAGNOSIS — Z01812 Encounter for preprocedural laboratory examination: Secondary | ICD-10-CM

## 2022-06-15 DIAGNOSIS — N186 End stage renal disease: Secondary | ICD-10-CM | POA: Diagnosis not present

## 2022-06-15 DIAGNOSIS — K219 Gastro-esophageal reflux disease without esophagitis: Secondary | ICD-10-CM | POA: Diagnosis not present

## 2022-06-15 DIAGNOSIS — Z992 Dependence on renal dialysis: Secondary | ICD-10-CM | POA: Diagnosis not present

## 2022-06-15 DIAGNOSIS — I509 Heart failure, unspecified: Secondary | ICD-10-CM | POA: Insufficient documentation

## 2022-06-15 DIAGNOSIS — Z79899 Other long term (current) drug therapy: Secondary | ICD-10-CM | POA: Diagnosis not present

## 2022-06-15 DIAGNOSIS — E785 Hyperlipidemia, unspecified: Secondary | ICD-10-CM | POA: Insufficient documentation

## 2022-06-15 HISTORY — PX: AV FISTULA PLACEMENT: SHX1204

## 2022-06-15 LAB — GLUCOSE, CAPILLARY: Glucose-Capillary: 87 mg/dL (ref 70–99)

## 2022-06-15 LAB — POCT I-STAT, CHEM 8
BUN: 28 mg/dL — ABNORMAL HIGH (ref 6–20)
Calcium, Ion: 1.19 mmol/L (ref 1.15–1.40)
Chloride: 98 mmol/L (ref 98–111)
Creatinine, Ser: 3.3 mg/dL — ABNORMAL HIGH (ref 0.61–1.24)
Glucose, Bld: 100 mg/dL — ABNORMAL HIGH (ref 70–99)
HCT: 41 % (ref 39.0–52.0)
Hemoglobin: 13.9 g/dL (ref 13.0–17.0)
Potassium: 4.3 mmol/L (ref 3.5–5.1)
Sodium: 137 mmol/L (ref 135–145)
TCO2: 27 mmol/L (ref 22–32)

## 2022-06-15 LAB — ABO/RH: ABO/RH(D): A POS

## 2022-06-15 SURGERY — ARTERIOVENOUS (AV) FISTULA CREATION
Anesthesia: General | Laterality: Right

## 2022-06-15 MED ORDER — SODIUM CHLORIDE 0.9 % IV SOLN
INTRAVENOUS | Status: DC | PRN
  Administered 2022-06-15: 200 mL

## 2022-06-15 MED ORDER — FENTANYL CITRATE (PF) 100 MCG/2ML IJ SOLN: 25.0000 ug | INTRAMUSCULAR | Status: DC | PRN

## 2022-06-15 MED ORDER — SODIUM CHLORIDE 0.9 % IV SOLN: INTRAVENOUS | Status: DC

## 2022-06-15 MED ORDER — DEXAMETHASONE SODIUM PHOSPHATE 10 MG/ML IJ SOLN
INTRAMUSCULAR | Status: DC | PRN
  Administered 2022-06-15: 5 mg via INTRAVENOUS

## 2022-06-15 MED ORDER — SUCCINYLCHOLINE CHLORIDE 200 MG/10ML IV SOSY
PREFILLED_SYRINGE | INTRAVENOUS | Status: DC | PRN
  Administered 2022-06-15: 100 mg via INTRAVENOUS

## 2022-06-15 MED ORDER — EPHEDRINE SULFATE (PRESSORS) 50 MG/ML IJ SOLN
INTRAMUSCULAR | Status: DC | PRN
  Administered 2022-06-15 (×2): 5 mg via INTRAVENOUS
  Administered 2022-06-15: 10 mg via INTRAVENOUS
  Administered 2022-06-15: 5 mg via INTRAVENOUS

## 2022-06-15 MED ORDER — ONDANSETRON HCL 4 MG/2ML IJ SOLN
INTRAMUSCULAR | Status: AC
  Filled 2022-06-15: qty 2

## 2022-06-15 MED ORDER — ROCURONIUM BROMIDE 10 MG/ML (PF) SYRINGE
PREFILLED_SYRINGE | INTRAVENOUS | Status: AC
  Filled 2022-06-15: qty 10

## 2022-06-15 MED ORDER — CEFAZOLIN SODIUM-DEXTROSE 2-4 GM/100ML-% IV SOLN
INTRAVENOUS | Status: AC
  Filled 2022-06-15: qty 100

## 2022-06-15 MED ORDER — CHLORHEXIDINE GLUCONATE CLOTH 2 % EX PADS: 6.0000 | MEDICATED_PAD | Freq: Once | CUTANEOUS | Status: DC

## 2022-06-15 MED ORDER — EPHEDRINE 5 MG/ML INJ
INTRAVENOUS | Status: AC
  Filled 2022-06-15: qty 5

## 2022-06-15 MED ORDER — ONDANSETRON HCL 4 MG/2ML IJ SOLN: 4.0000 mg | Freq: Four times a day (QID) | INTRAMUSCULAR | Status: DC | PRN

## 2022-06-15 MED ORDER — HYDROMORPHONE HCL 1 MG/ML IJ SOLN: 1.0000 mg | Freq: Once | INTRAMUSCULAR | Status: DC | PRN

## 2022-06-15 MED ORDER — FENTANYL CITRATE (PF) 100 MCG/2ML IJ SOLN
INTRAMUSCULAR | Status: AC
  Filled 2022-06-15: qty 2

## 2022-06-15 MED ORDER — HEPARIN SODIUM (PORCINE) 1000 UNIT/ML IJ SOLN
INTRAMUSCULAR | Status: DC | PRN
  Administered 2022-06-15: 3000 [IU] via INTRAVENOUS

## 2022-06-15 MED ORDER — OXYCODONE HCL 5 MG/5ML PO SOLN: 5.0000 mg | Freq: Once | ORAL | Status: DC | PRN

## 2022-06-15 MED ORDER — ONDANSETRON HCL 4 MG/2ML IJ SOLN
INTRAMUSCULAR | Status: DC | PRN
  Administered 2022-06-15: 4 mg via INTRAVENOUS

## 2022-06-15 MED ORDER — HYDROCODONE-ACETAMINOPHEN 5-325 MG PO TABS: 2.0000 | ORAL_TABLET | Freq: Four times a day (QID) | ORAL | 0 refills | Status: DC | PRN

## 2022-06-15 MED ORDER — LIDOCAINE HCL (CARDIAC) PF 100 MG/5ML IV SOSY
PREFILLED_SYRINGE | INTRAVENOUS | Status: DC | PRN
  Administered 2022-06-15: 100 mg via INTRAVENOUS

## 2022-06-15 MED ORDER — DEXAMETHASONE SODIUM PHOSPHATE 10 MG/ML IJ SOLN
INTRAMUSCULAR | Status: AC
  Filled 2022-06-15: qty 1

## 2022-06-15 MED ORDER — BUPIVACAINE-EPINEPHRINE (PF) 0.5% -1:200000 IJ SOLN
INTRAMUSCULAR | Status: AC
  Filled 2022-06-15: qty 30

## 2022-06-15 MED ORDER — PROPOFOL 10 MG/ML IV BOLUS
INTRAVENOUS | Status: DC | PRN
  Administered 2022-06-15: 150 mg via INTRAVENOUS

## 2022-06-15 MED ORDER — PROPOFOL 10 MG/ML IV BOLUS
INTRAVENOUS | Status: AC
  Filled 2022-06-15: qty 20

## 2022-06-15 MED ORDER — FENTANYL CITRATE (PF) 100 MCG/2ML IJ SOLN
INTRAMUSCULAR | Status: DC | PRN
Start: 2022-06-15 — End: 2022-06-15
  Administered 2022-06-15: 25 ug via INTRAVENOUS
  Administered 2022-06-15: 50 ug via INTRAVENOUS
  Administered 2022-06-15: 25 ug via INTRAVENOUS

## 2022-06-15 MED ORDER — MIDAZOLAM HCL 2 MG/2ML IJ SOLN
INTRAMUSCULAR | Status: DC | PRN
  Administered 2022-06-15: 1 mg via INTRAVENOUS

## 2022-06-15 MED ORDER — MIDAZOLAM HCL 2 MG/2ML IJ SOLN
INTRAMUSCULAR | Status: AC
  Filled 2022-06-15: qty 2

## 2022-06-15 MED ORDER — CHLORHEXIDINE GLUCONATE 0.12 % MT SOLN: 15.0000 mL | Freq: Once | OROMUCOSAL | Status: AC

## 2022-06-15 MED ORDER — OXYCODONE HCL 5 MG PO TABS: 5.0000 mg | ORAL_TABLET | Freq: Once | ORAL | Status: DC | PRN

## 2022-06-15 MED ORDER — CEFAZOLIN SODIUM-DEXTROSE 2-4 GM/100ML-% IV SOLN
2.0000 g | INTRAVENOUS | Status: AC
  Administered 2022-06-15: 2 g via INTRAVENOUS

## 2022-06-15 MED ORDER — HEPARIN SODIUM (PORCINE) 5000 UNIT/ML IJ SOLN
INTRAMUSCULAR | Status: AC
  Filled 2022-06-15: qty 1

## 2022-06-15 MED ORDER — CHLORHEXIDINE GLUCONATE 0.12 % MT SOLN
OROMUCOSAL | Status: AC
  Administered 2022-06-15: 15 mL via OROMUCOSAL
  Filled 2022-06-15: qty 15

## 2022-06-15 MED ORDER — PHENYLEPHRINE HCL-NACL 20-0.9 MG/250ML-% IV SOLN
INTRAVENOUS | Status: DC | PRN
  Administered 2022-06-15: 45 ug/min via INTRAVENOUS

## 2022-06-15 MED ORDER — LIDOCAINE HCL (PF) 2 % IJ SOLN
INTRAMUSCULAR | Status: AC
  Filled 2022-06-15: qty 5

## 2022-06-15 MED ORDER — CHLORHEXIDINE GLUCONATE CLOTH 2 % EX PADS
6.0000 | MEDICATED_PAD | Freq: Once | CUTANEOUS | Status: AC
  Administered 2022-06-15: 6 via TOPICAL

## 2022-06-15 MED ORDER — PHENYLEPHRINE 80 MCG/ML (10ML) SYRINGE FOR IV PUSH (FOR BLOOD PRESSURE SUPPORT)
PREFILLED_SYRINGE | INTRAVENOUS | Status: DC | PRN
  Administered 2022-06-15: 80 ug via INTRAVENOUS

## 2022-06-15 MED ORDER — ORAL CARE MOUTH RINSE: 15.0000 mL | Freq: Once | OROMUCOSAL | Status: AC

## 2022-06-15 SURGICAL SUPPLY — 49 items
BAG DECANTER FOR FLEXI CONT (MISCELLANEOUS) ×2 IMPLANT
BLADE SURG SZ11 CARB STEEL (BLADE) ×2 IMPLANT
BOOT SUTURE AID YELLOW STND (SUTURE) ×2 IMPLANT
BRUSH SCRUB EZ  4% CHG (MISCELLANEOUS) ×2
BRUSH SCRUB EZ 4% CHG (MISCELLANEOUS) ×1 IMPLANT
CHLORAPREP W/TINT 26 (MISCELLANEOUS) ×2 IMPLANT
CLIP SPRNG 6 S-JAW DBL (CLIP) ×1 IMPLANT
CLIP SPRNG 6MM S-JAW DBL (CLIP) ×2
DERMABOND ADVANCED (GAUZE/BANDAGES/DRESSINGS) ×1
DERMABOND ADVANCED .7 DNX12 (GAUZE/BANDAGES/DRESSINGS) ×1 IMPLANT
ELECT CAUTERY BLADE 6.4 (BLADE) ×2 IMPLANT
ELECT REM PT RETURN 9FT ADLT (ELECTROSURGICAL) ×2
ELECTRODE REM PT RTRN 9FT ADLT (ELECTROSURGICAL) ×1 IMPLANT
GLOVE BIO SURGEON STRL SZ7 (GLOVE) ×2 IMPLANT
GOWN STRL REUS W/ TWL LRG LVL3 (GOWN DISPOSABLE) ×1 IMPLANT
GOWN STRL REUS W/ TWL XL LVL3 (GOWN DISPOSABLE) ×2 IMPLANT
GOWN STRL REUS W/TWL LRG LVL3 (GOWN DISPOSABLE) ×2
GOWN STRL REUS W/TWL XL LVL3 (GOWN DISPOSABLE) ×4
HEMOSTAT SURGICEL 2X3 (HEMOSTASIS) ×2 IMPLANT
IV NS 500ML (IV SOLUTION) ×2
IV NS 500ML BAXH (IV SOLUTION) ×1 IMPLANT
KIT TURNOVER KIT A (KITS) ×2 IMPLANT
LOOP RED MAXI  1X406MM (MISCELLANEOUS) ×2
LOOP VESSEL MAXI 1X406 RED (MISCELLANEOUS) ×1 IMPLANT
LOOP VESSEL MINI 0.8X406 BLUE (MISCELLANEOUS) ×1 IMPLANT
LOOPS BLUE MINI 0.8X406MM (MISCELLANEOUS) ×2
MANIFOLD NEPTUNE II (INSTRUMENTS) ×2 IMPLANT
NDL FILTER BLUNT 18X1 1/2 (NEEDLE) ×1 IMPLANT
NEEDLE FILTER BLUNT 18X 1/2SAF (NEEDLE) ×1
NEEDLE FILTER BLUNT 18X1 1/2 (NEEDLE) ×1 IMPLANT
NS IRRIG 500ML POUR BTL (IV SOLUTION) ×2 IMPLANT
PACK EXTREMITY ARMC (MISCELLANEOUS) ×2 IMPLANT
PAD PREP 24X41 OB/GYN DISP (PERSONAL CARE ITEMS) ×2 IMPLANT
SOLUTION CELL SAVER (CLIP) ×1 IMPLANT
STOCKINETTE 48X4 2 PLY STRL (GAUZE/BANDAGES/DRESSINGS) ×1 IMPLANT
STOCKINETTE STRL 4IN 9604848 (GAUZE/BANDAGES/DRESSINGS) ×2 IMPLANT
SUT MNCRL AB 4-0 PS2 18 (SUTURE) ×2 IMPLANT
SUT PROLENE 6 0 BV (SUTURE) ×8 IMPLANT
SUT SILK 2 0 (SUTURE) ×2
SUT SILK 2-0 18XBRD TIE 12 (SUTURE) ×1 IMPLANT
SUT SILK 3 0 (SUTURE) ×2
SUT SILK 3-0 18XBRD TIE 12 (SUTURE) ×1 IMPLANT
SUT SILK 4 0 (SUTURE) ×2
SUT SILK 4-0 18XBRD TIE 12 (SUTURE) ×1 IMPLANT
SUT VIC AB 3-0 SH 27 (SUTURE) ×2
SUT VIC AB 3-0 SH 27X BRD (SUTURE) ×1 IMPLANT
SYR 20ML LL LF (SYRINGE) ×2 IMPLANT
TRAP FLUID SMOKE EVACUATOR (MISCELLANEOUS) ×2 IMPLANT
WATER STERILE IRR 500ML POUR (IV SOLUTION) ×2 IMPLANT

## 2022-06-15 NOTE — Anesthesia Procedure Notes (Signed)
Procedure Name: Intubation Date/Time: 06/15/2022 12:47 PM  Performed by: Loletha Grayer, CRNAPre-anesthesia Checklist: Patient identified, Patient being monitored, Timeout performed, Emergency Drugs available and Suction available Patient Re-evaluated:Patient Re-evaluated prior to induction Oxygen Delivery Method: Circle system utilized Preoxygenation: Pre-oxygenation with 100% oxygen Induction Type: IV induction and Rapid sequence Ventilation: Mask ventilation without difficulty Laryngoscope Size: 3 and McGraph Grade View: Grade I Tube type: Oral Tube size: 7.5 mm Number of attempts: 1 Airway Equipment and Method: Stylet Placement Confirmation: ETT inserted through vocal cords under direct vision, positive ETCO2 and breath sounds checked- equal and bilateral Secured at: 21 cm Tube secured with: Tape Dental Injury: Teeth and Oropharynx as per pre-operative assessment

## 2022-06-15 NOTE — Interval H&P Note (Signed)
History and Physical Interval Note:  06/15/2022 12:16 PM  Randall [name]  has presented today for surgery, with the diagnosis of ESRD.  The various methods of treatment have been discussed with the patient and family. After consideration of risks, benefits and other options for treatment, the patient has consented to  Procedure(s): ARTERIOVENOUS (AV) FISTULA CREATION (BRACHIALCEPHALIC) (Right) as a surgical intervention.  The patient's history has been reviewed, patient examined, no change in status, stable for surgery.  I have reviewed the patient's chart and labs.  Questions were answered to the patient's satisfaction.     [name]

## 2022-06-15 NOTE — Op Note (Signed)
[facility]   OPERATIVE NOTE   PROCEDURE: Right brachiocephalic arteriovenous fistula placement  PRE-OPERATIVE DIAGNOSIS: 1.  ESRD        POST-OPERATIVE DIAGNOSIS: 1. ESRD       SURGEON: [name], MD  ASSISTANT(S): none  ANESTHESIA: general  ESTIMATED BLOOD LOSS: 10 cc  FINDING(S): Adequate cephalic vein for fistula creation  SPECIMEN(S):  none  INDICATIONS:   Randall [name] is a 61 y.o. male who presents with renal failure in need of pemanent dialysis acces.  The patient is scheduled for right arm AVF placement.  The patient is aware the risks include but are not limited to: bleeding, infection, steal syndrome, nerve damage, ischemic monomelic neuropathy, failure to mature, and need for additional procedures.  The patient is aware of the risks of the procedure and elects to proceed forward.  DESCRIPTION: After full informed written consent was obtained from the patient, the patient was brought back to the operating room and placed supine upon the operating table.  Prior to induction, the patient received IV antibiotics.   After obtaining adequate anesthesia, the patient was then prepped and draped in the standard fashion for a right arm access procedure.  I made a curvilinear incision at the level of the antecubital fossa and dissected through the subcutaneous tissue and fascia to gain exposure of the brachial artery.  This was noted to be patent and adequate in size for fistula creation.  This was dissected out proximally and distally and prepared for control with vessel loops .  I then dissected out the cephalic vein.  This was noted to be patent and adequate in size for fistula creation.  I then gave the patient 3000 units of intravenous heparin.  The vein was marked for orientation and the distal segment of the vein was ligated with a  2-0 silk, and the vein was transected.  I then instilled the heparinized saline into the vein and clamped it.  At this  point, I reset my exposure of the brachial artery and pulled up control on the vessel loops.  I made an arteriotomy with a #11 blade, and then I extended the arteriotomy with a Potts scissor.  I injected heparinized saline proximal and distal to this arteriotomy.  The vein was then sewn to the artery in an end-to-side configuration with a running stitch of 6-0 Prolene.  Prior to completing this anastomosis, I allowed the vein and artery to backbleed.  There was no evidence of clot from any vessels.  I completed the anastomosis in the usual fashion and then released all vessel loops and clamps.  There was a palpable  thrill in the venous outflow, and there was a palpable pulse in the artery distal to the anastomosis.  At this point, I irrigated out the surgical wound.  Surgicel was placed. There was no further active bleeding.  The subcutaneous tissue was reapproximated with a running stitch of 3-0 Vicryl.  The skin was then closed with a 4-0 Monocryl suture.  The skin was then cleaned, dried, and reinforced with Dermabond.  The patient tolerated this procedure well and was taken to the recovery room in stable condition  COMPLICATIONS: None  CONDITION: Stable   [name]    06/15/2022, 2:10 PM  This note was created with Dragon Medical transcription system. Any errors in dictation are purely unintentional.

## 2022-06-15 NOTE — Discharge Instructions (Signed)

## 2022-06-15 NOTE — Transfer of Care (Signed)
Immediate Anesthesia Transfer of Care Note  Patient: Randall [name]  Procedure(s) Performed: ARTERIOVENOUS (AV) FISTULA CREATION (BRACHIALCEPHALIC) (Right)  Patient Location: PACU  Anesthesia Type:General  Level of Consciousness: awake  Airway & Oxygen Therapy: Patient Spontanous Breathing and Patient connected to face mask oxygen  Post-op Assessment: Report given to RN and Post -op Vital signs reviewed and stable  Post vital signs: Reviewed and stable  Last Vitals:  Vitals Value Taken Time  BP 164/68 06/15/22 1404  Temp    Pulse 61 06/15/22 1407  Resp 16 06/15/22 1407  SpO2 100 % 06/15/22 1407    Last Pain:  Vitals:   06/15/22 1125  TempSrc: Temporal  PainSc: 0-No pain         Complications: No notable events documented.

## 2022-06-15 NOTE — Anesthesia Preprocedure Evaluation (Signed)
Anesthesia Evaluation  Patient identified by MRN, date of birth, ID band Patient awake    Reviewed: Allergy & Precautions, NPO status , Patient's Chart, lab work & pertinent test results  History of Anesthesia Complications Negative for: history of anesthetic complications  Airway Mallampati: IV  TM Distance: >3 FB Neck ROM: full    Dental  (+) Poor Dentition, Missing   Pulmonary neg pulmonary ROS, neg shortness of breath, neg COPD, neg recent URI, Current Smoker and Patient abstained from smoking.,    Pulmonary exam normal        Cardiovascular hypertension, (-) angina+CHF  Normal cardiovascular exam+ Valvular Problems/Murmurs      Neuro/Psych PSYCHIATRIC DISORDERS CVA, Residual Symptoms    GI/Hepatic Neg liver ROS, GERD  Poorly Controlled,  Endo/Other  negative endocrine ROSdiabetes  Renal/GU      Musculoskeletal   Abdominal   Peds  Hematology negative hematology ROS (+)   Anesthesia Other Findings Past Medical History: No date: Anemia No date: Anxiety No date: Arthritis No date: CHF (congestive heart failure) (HCC) No date: Chronic kidney disease No date: Dementia ([place]) No date: Depression No date: Diabetes mellitus without complication (HCC) No date: GERD (gastroesophageal reflux disease) No date: Heart murmur No date: Hypertension 12/09/2021: Hypertensive urgency No date: Pneumonia No date: Stroke [facility])  Past Surgical History: No date: COLONOSCOPY W/ POLYPECTOMY No date: dental work 12/30/2021: DIALYSIS/PERMA CATHETER INSERTION; N/A     Comment:  Procedure: DIALYSIS/PERMA CATHETER INSERTION;  Surgeon:               Algernon Huxley, MD;  Location: [facility];                Service: Cardiovascular;  Laterality: N/A; 12/26/2021: ESOPHAGOGASTRODUODENOSCOPY (EGD) WITH PROPOFOL; N/A     Comment:  Procedure: ESOPHAGOGASTRODUODENOSCOPY (EGD) WITH               PROPOFOL;  Surgeon: Annamaria Helling, DO;  Location:              [facility];  Service: Gastroenterology;  Laterality:               N/A; 12/27/2021: TEMPORARY DIALYSIS CATHETER; N/A     Comment:  Procedure: TEMPORARY DIALYSIS CATHETER;  Surgeon:               Katha Cabal, MD;  Location: [facility];               Service: Cardiovascular;  Laterality: N/A; No date: TONSILLECTOMY     Comment:  adeniods removed  BMI    Body Mass Index: 26.96 kg/m      Reproductive/Obstetrics negative OB ROS                           Anesthesia Physical Anesthesia Plan  ASA: 3  Anesthesia Plan: General ETT   Post-op Pain Management:    Induction: Intravenous  PONV Risk Score and Plan: 2 and Ondansetron, Dexamethasone, Midazolam and Treatment may vary due to age or medical condition  Airway Management Planned: Oral ETT  Additional Equipment:   Intra-op Plan:   Post-operative Plan: Extubation in OR  Informed Consent: I have reviewed the patients History and Physical, chart, labs and discussed the procedure including the risks, benefits and alternatives for the proposed anesthesia with the patient or authorized representative who has indicated his/her understanding and acceptance.     Dental Advisory Given  Plan  Discussed with: Anesthesiologist, CRNA and Surgeon  Anesthesia Plan Comments: (Patient consented for risks of anesthesia including but not limited to:  - adverse reactions to medications - damage to eyes, teeth, lips or other oral mucosa - nerve damage due to positioning  - sore throat or hoarseness - Damage to heart, brain, nerves, lungs, other parts of body or loss of life  Patient voiced understanding.)       Anesthesia Quick Evaluation

## 2022-06-15 NOTE — Anesthesia Postprocedure Evaluation (Signed)
Anesthesia Post Note  Patient: Randall [name]  Procedure(s) Performed: ARTERIOVENOUS (AV) FISTULA CREATION (BRACHIALCEPHALIC) (Right)  Patient location during evaluation: PACU Anesthesia Type: General Level of consciousness: awake and alert Pain management: pain level controlled Vital Signs Assessment: post-procedure vital signs reviewed and stable Respiratory status: spontaneous breathing, nonlabored ventilation, respiratory function stable and patient connected to nasal cannula oxygen Cardiovascular status: blood pressure returned to baseline and stable Postop Assessment: no apparent nausea or vomiting Anesthetic complications: no   No notable events documented.   Last Vitals:  Vitals:   06/15/22 1440 06/15/22 1445  BP: (!) 171/77   Pulse: 63 63  Resp: (!) 7 (!) 8  Temp:    SpO2: 98% 98%    Last Pain:  Vitals:   06/15/22 1440  TempSrc:   PainSc: 0-No pain                 Dimas Millin

## 2022-06-21 ENCOUNTER — Emergency Department
Admission: EM | Admit: 2022-06-21 | Discharge: 2022-06-21 | Disposition: A | Discharge status: Home / Self Care | Payer: Medicaid Other | Attending: Emergency Medicine | Admitting: Emergency Medicine

## 2022-06-21 ENCOUNTER — Emergency Department: Payer: Medicaid Other

## 2022-06-21 DIAGNOSIS — Z20822 Contact with and (suspected) exposure to covid-19: Secondary | ICD-10-CM | POA: Diagnosis not present

## 2022-06-21 DIAGNOSIS — I509 Heart failure, unspecified: Secondary | ICD-10-CM | POA: Diagnosis not present

## 2022-06-21 DIAGNOSIS — F0390 Unspecified dementia without behavioral disturbance: Secondary | ICD-10-CM

## 2022-06-21 DIAGNOSIS — N186 End stage renal disease: Secondary | ICD-10-CM | POA: Diagnosis not present

## 2022-06-21 DIAGNOSIS — R410 Disorientation, unspecified: Secondary | ICD-10-CM | POA: Diagnosis present

## 2022-06-21 DIAGNOSIS — Z992 Dependence on renal dialysis: Secondary | ICD-10-CM | POA: Insufficient documentation

## 2022-06-21 DIAGNOSIS — R109 Unspecified abdominal pain: Secondary | ICD-10-CM | POA: Insufficient documentation

## 2022-06-21 LAB — COMPREHENSIVE METABOLIC PANEL
ALT: 16 U/L (ref 0–44)
AST: 25 U/L (ref 15–41)
Albumin: 3.9 g/dL (ref 3.5–5.0)
Alkaline Phosphatase: 87 U/L (ref 38–126)
Anion gap: 9 (ref 5–15)
BUN: 15 mg/dL (ref 6–20)
CO2: 29 mmol/L (ref 22–32)
Calcium: 8.6 mg/dL — ABNORMAL LOW (ref 8.9–10.3)
Chloride: 99 mmol/L (ref 98–111)
Creatinine, Ser: 2.05 mg/dL — ABNORMAL HIGH (ref 0.61–1.24)
GFR, Estimated: 36 mL/min — ABNORMAL LOW (ref >60)
Glucose, Bld: 95 mg/dL (ref 70–99)
Potassium: 3.8 mmol/L (ref 3.5–5.1)
Sodium: 137 mmol/L (ref 135–145)
Total Bilirubin: 0.6 mg/dL (ref 0.3–1.2)
Total Protein: 8 g/dL (ref 6.5–8.1)

## 2022-06-21 LAB — CBC
HCT: 37.1 % — ABNORMAL LOW (ref 39.0–52.0)
Hemoglobin: 12.3 g/dL — ABNORMAL LOW (ref 13.0–17.0)
MCH: 31.9 pg (ref 26.0–34.0)
MCHC: 33.2 g/dL (ref 30.0–36.0)
MCV: 96.1 fL (ref 80.0–100.0)
Platelets: 191 10*3/uL (ref 150–400)
RBC: 3.86 MIL/uL — ABNORMAL LOW (ref 4.22–5.81)
RDW: 12.7 % (ref 11.5–15.5)
WBC: 7.7 10*3/uL (ref 4.0–10.5)
nRBC: 0 % (ref 0.0–0.2)

## 2022-06-21 LAB — URINALYSIS, ROUTINE W REFLEX MICROSCOPIC
Bacteria, UA: NONE SEEN
Bilirubin Urine: NEGATIVE
Glucose, UA: NEGATIVE mg/dL
Hgb urine dipstick: NEGATIVE
Ketones, ur: NEGATIVE mg/dL
Leukocytes,Ua: NEGATIVE
Nitrite: NEGATIVE
Protein, ur: 100 mg/dL — AB
Specific Gravity, Urine: 1.016 (ref 1.005–1.030)
pH: 7 (ref 5.0–8.0)

## 2022-06-21 LAB — LIPASE, BLOOD: Lipase: 29 U/L (ref 11–51)

## 2022-06-21 LAB — CBG MONITORING, ED: Glucose-Capillary: 80 mg/dL (ref 70–99)

## 2022-06-21 LAB — SARS CORONAVIRUS 2 BY RT PCR: SARS Coronavirus 2 by RT PCR: NEGATIVE

## 2022-06-21 MED ORDER — ONDANSETRON HCL 4 MG/2ML IJ SOLN
4.0000 mg | Freq: Once | INTRAMUSCULAR | Status: AC
Start: 2022-06-21 — End: 2022-06-21
  Administered 2022-06-21: 4 mg via INTRAVENOUS
  Filled 2022-06-21: qty 2

## 2022-06-21 NOTE — ED Provider Notes (Signed)
[facility] Provider Note    Event Date/Time   First MD Initiated Contact with Patient 06/21/22 1508     (approximate)   History   Chief Complaint: Altered Mental Status   HPI  Randall [name] is a 62 y.o. male with a history of dementia, CHF, strokes, ESRD on hemodialysis who is brought to the ED due to increased confusion above his usual baseline of dementia.  Daughter at bedside notes that he has had some decreased oral intake lately, no vomiting or diarrhea or fever.  No falls or trauma.  Patient has been on dialysis for the past 3 months.  History is limited by patient's advanced dementia  To me patient endorses abdominal pain.     Physical Exam   Triage Vital Signs: ED Triage Vitals  Enc Vitals Group     BP 06/21/22 1503 (!) 189/65     Pulse Rate 06/21/22 1501 (!) 42     Resp 06/21/22 1501 16     Temp 06/21/22 1503 98.4 F (36.9 C)     Temp Source 06/21/22 1503 Oral     SpO2 06/21/22 1458 97 %     Weight 06/21/22 1502 180 lb 3.2 oz (81.7 kg)     Height 06/21/22 1502 5\' 9"  (1.753 m)     Head Circumference --      Peak Flow --      Pain Score --      Pain Loc --      Pain Edu? --      Excl. in [place]? --     Most recent vital signs: Vitals:   06/21/22 1501 06/21/22 1503  BP:  (!) 189/65  Pulse: (!) 42   Resp: 16   Temp:  98.4 F (36.9 C)  SpO2: 100%     General: Awake, no distress.  CV:  Good peripheral perfusion.  Regular rate and rhythm. Resp:  Normal effort.  Clear to auscultation bilaterally Abd:  No distention.  Soft, generalized tenderness without guarding Other:  Moist oral mucosa.  No lower extremity edema.   ED Results / Procedures / Treatments   Labs (all labs ordered are listed, but only abnormal results are displayed) Labs Reviewed  COMPREHENSIVE METABOLIC PANEL - Abnormal; Notable for the following components:      Result Value   Creatinine, Ser 2.05 (*)    Calcium 8.6 (*)    GFR, Estimated 36 (*)     All other components within normal limits  CBC - Abnormal; Notable for the following components:   RBC 3.86 (*)    Hemoglobin 12.3 (*)    HCT 37.1 (*)    All other components within normal limits  SARS CORONAVIRUS 2 BY RT PCR  LIPASE, BLOOD  URINALYSIS, ROUTINE W REFLEX MICROSCOPIC  CBG MONITORING, ED     EKG Normal sinus rhythm, rate of 68.  Normal axis and intervals.  Normal QRS ST segments and T waves.  No acute ischemic changes.   RADIOLOGY CT head interpreted by me, negative for mass or intracranial hemorrhage.  Radiology report reviewed.  CT abdomen pelvis unremarkable   PROCEDURES:  .1-3 Lead EKG Interpretation  Performed by: Carrie Mew, MD Authorized by: Carrie Mew, MD     Interpretation: normal     ECG rate:  70   ECG rate assessment: normal     Rhythm: sinus rhythm     Ectopy: none     Conduction: normal  MEDICATIONS ORDERED IN ED: Medications  ondansetron (ZOFRAN) injection 4 mg (4 mg Intravenous Given 06/21/22 1553)     IMPRESSION / MDM / ASSESSMENT AND PLAN / ED COURSE  I reviewed the triage vital signs and the nursing notes.                              Differential diagnosis includes, but is not limited to, intracranial mass, intracranial hemorrhage, diverticulitis, pancreatitis, gastritis, biliary disease, appendicitis, viral illness, progressive dementia, electrolyte abnormality, anemia  Patient's presentation is most consistent with acute presentation with potential threat to life or bodily function.  Patient presents with increased confusion compared to typical baseline.  Per notes from dialysis, patient had 1.5 L ultrafiltration today and almost completed his entire dialysis session.  Complaints and exam are suggestive of abdominal pathology.  CT head and abdomen are unremarkable.  Labs unremarkable.  Will p.o. trial.       FINAL CLINICAL IMPRESSION(S) / ED DIAGNOSES   Final diagnoses:  Confusion  Chronic dementia  ([place])  ESRD on hemodialysis ([place])     Rx / DC Orders   ED Discharge Orders     None        Note:  This document was prepared using Dragon voice recognition software and may include unintentional dictation errors.   Carrie Mew, MD 06/21/22 [id number]

## 2022-06-21 NOTE — ED Notes (Signed)
RN assumed care. Pt noted to be sitting on side of bed eating provided dinner tray. Patient's daughter is at bedside with patient.

## 2022-06-21 NOTE — ED Triage Notes (Signed)
Pt from dialysis. Received 3 hours of treatment, normally gets 4 hours. Pt daughter says he has been becoming increasingly more confused. Pt has dementia. Pt had a low bp reading of systolic 80 prior to a liter bolus. Pt has been hypertensive every since.

## 2022-06-21 NOTE — ED Notes (Signed)
Patient discharged at this time. Wheeled to lobby with family. Breathing unlabored speaking in full sentences. Pt and family verbalized understanding of all discharge and follow up teaching.  Discharged homed with all belongings.

## 2022-06-21 NOTE — Discharge Instructions (Addendum)
Your lab tests and CT scans today were all reassuring.  Continue taking all medications and follow usual diet, and follow-up with your primary care doctor for further evaluation of your symptoms.

## 2022-06-27 ENCOUNTER — Ambulatory Visit (INDEPENDENT_AMBULATORY_CARE_PROVIDER_SITE_OTHER): Payer: 59 | Admitting: Podiatry

## 2022-06-27 DIAGNOSIS — M79674 Pain in right toe(s): Secondary | ICD-10-CM | POA: Diagnosis not present

## 2022-06-27 DIAGNOSIS — B351 Tinea unguium: Secondary | ICD-10-CM | POA: Diagnosis not present

## 2022-06-27 DIAGNOSIS — M79675 Pain in left toe(s): Secondary | ICD-10-CM | POA: Diagnosis not present

## 2022-06-27 NOTE — Progress Notes (Signed)
   SUBJECTIVE Patient presents to office today complaining of elongated, thickened nails that cause pain while ambulating in shoes.  Patient is unable to trim their own nails. Patient is here for further evaluation and treatment.  Past Medical History:  Diagnosis Date   Anemia    Anxiety    Arthritis    CHF (congestive heart failure) (HCC)    Chronic kidney disease    Dementia ([place])    Depression    Diabetes mellitus without complication (HCC)    GERD (gastroesophageal reflux disease)    Heart murmur    Hypertension    Hypertensive urgency 12/09/2021   Pneumonia    Stroke [facility])     OBJECTIVE General Patient is awake, alert, and oriented x 3 and in no acute distress. Derm Skin is dry and supple bilateral. Negative open lesions or macerations. Remaining integument unremarkable. Nails are tender, long, thickened and dystrophic with subungual debris, consistent with onychomycosis, 1-5 bilateral. No signs of infection noted. Vasc  DP and PT pedal pulses palpable bilaterally. Temperature gradient within normal limits.  Neuro Epicritic and protective threshold sensation grossly intact bilaterally.  Musculoskeletal Exam No symptomatic pedal deformities noted bilateral. Muscular strength within normal limits.  ASSESSMENT 1.  Pain due to onychomycosis of toenails both  PLAN OF CARE 1. Patient evaluated today.  2. Instructed to maintain good pedal hygiene and foot care.  3. Mechanical debridement of nails 1-5 bilaterally performed using a nail nipper. Filed with dremel without incident.  4. Return to clinic in 3 mos.    Edrick Kins, DPM [facility]  Dr. Edrick Kins, DPM    [address], [place] [address]                Office [phone]  Fax [phone]

## 2022-07-18 ENCOUNTER — Other Ambulatory Visit (INDEPENDENT_AMBULATORY_CARE_PROVIDER_SITE_OTHER): Payer: Self-pay | Admitting: Vascular Surgery

## 2022-07-18 DIAGNOSIS — Z9889 Other specified postprocedural states: Secondary | ICD-10-CM

## 2022-07-18 DIAGNOSIS — N186 End stage renal disease: Secondary | ICD-10-CM

## 2022-07-20 ENCOUNTER — Ambulatory Visit (INDEPENDENT_AMBULATORY_CARE_PROVIDER_SITE_OTHER): Payer: 59 | Admitting: Nurse Practitioner

## 2022-07-20 ENCOUNTER — Ambulatory Visit (INDEPENDENT_AMBULATORY_CARE_PROVIDER_SITE_OTHER): Payer: 59

## 2022-07-20 ENCOUNTER — Encounter (INDEPENDENT_AMBULATORY_CARE_PROVIDER_SITE_OTHER): Payer: Self-pay | Admitting: Nurse Practitioner

## 2022-07-20 DIAGNOSIS — N186 End stage renal disease: Secondary | ICD-10-CM | POA: Diagnosis not present

## 2022-07-20 DIAGNOSIS — Z9889 Other specified postprocedural states: Secondary | ICD-10-CM

## 2022-07-21 ENCOUNTER — Encounter (INDEPENDENT_AMBULATORY_CARE_PROVIDER_SITE_OTHER): Payer: Self-pay | Admitting: Nurse Practitioner

## 2022-07-21 NOTE — Progress Notes (Signed)
Subjective:    Patient ID: Randall [name], male    DOB: 07/05/1961, 61 y.o.   MRN: [id number] No chief complaint on file.   Titus Drone is a 61 year old male who presents today for follow-up of a right brachiocephalic AV fistula placement.  He denies any excessive pain or symptoms.  No significant swelling.  He notes that he has no pain or issues in his hand.  He is mostly progressing well.  He currently is maintained via PermCath and notes it is functioning well.    Review of Systems  Skin:  Positive for wound.  All other systems reviewed and are negative.      Objective:   Physical Exam Vitals reviewed.  HENT:     Head: Normocephalic.  Cardiovascular:     Rate and Rhythm: Normal rate.     Pulses:          Radial pulses are 1+ on the right side and 1+ on the left side.  Pulmonary:     Effort: Pulmonary effort is normal.  Skin:    General: Skin is warm and dry.  Neurological:     Mental Status: He is alert and oriented to person, place, and time.  Psychiatric:        Mood and Affect: Mood normal.        Behavior: Behavior normal.        Thought Content: Thought content normal.        Judgment: Judgment normal.     BP (!) 150/59 (BP Location: Right Arm)   Pulse 66   Resp 17   Ht 5\' 9"  (1.753 m)   Wt 181 lb 6.4 oz (82.3 kg)   BMI 26.79 kg/m   Past Medical History:  Diagnosis Date   Anemia    Anxiety    Arthritis    CHF (congestive heart failure) (HCC)    Chronic kidney disease    Dementia (HCC)    Depression    Diabetes mellitus without complication (HCC)    GERD (gastroesophageal reflux disease)    Heart murmur    Hypertension    Hypertensive urgency 12/09/2021   Pneumonia    Stroke [facility])     Social History   Socioeconomic History   Marital status: Divorced    Spouse name: Not on file   Number of children: Not on file   Years of education: Not on file   Highest education level: Not on file  Occupational History   Not on file   Tobacco Use   Smoking status: Every Day    Packs/day: 0.25    Types: Cigars, Cigarettes   Smokeless tobacco: Not on file  Substance and Sexual Activity   Alcohol use: Yes    Alcohol/week: 1.0 standard drink of alcohol    Types: 1 Glasses of wine per week    Comment: rarely   Drug use: Never   Sexual activity: Not Currently  Other Topics Concern   Not on file  Social History Narrative   Shipes,Joann (Sister) lives with sister   Social Determinants of Health   Financial Resource Strain: Not on file  Food Insecurity: Not on file  Transportation Needs: Not on file  Physical Activity: Not on file  Stress: Not on file  Social Connections: Not on file  Intimate Partner Violence: Not on file    Past Surgical History:  Procedure Laterality Date   AV FISTULA PLACEMENT Right 06/15/2022   Procedure: ARTERIOVENOUS (AV) FISTULA CREATION (BRACHIALCEPHALIC);  Surgeon: Algernon Huxley, MD;  Location: [facility] ORS;  Service: Vascular;  Laterality: Right;   COLONOSCOPY W/ POLYPECTOMY     dental work     DIALYSIS/PERMA CATHETER INSERTION N/A 12/30/2021   Procedure: DIALYSIS/PERMA CATHETER INSERTION;  Surgeon: Algernon Huxley, MD;  Location: [place] [facility];  Service: Cardiovascular;  Laterality: N/A;   ESOPHAGOGASTRODUODENOSCOPY (EGD) WITH PROPOFOL N/A 12/26/2021   Procedure: ESOPHAGOGASTRODUODENOSCOPY (EGD) WITH PROPOFOL;  Surgeon: Annamaria Helling, DO;  Location: [facility] ENDOSCOPY;  Service: Gastroenterology;  Laterality: N/A;   TEMPORARY DIALYSIS CATHETER N/A 12/27/2021   Procedure: TEMPORARY DIALYSIS CATHETER;  Surgeon: Katha Cabal, MD;  Location: [place] [facility];  Service: Cardiovascular;  Laterality: N/A;   TONSILLECTOMY     adeniods removed    Family History  Problem Relation Age of Onset   Hypertension Mother    Dementia Mother    Hypertension Maternal Grandmother    Diabetes Maternal Grandmother    Stroke Maternal Grandmother    Heart attack Maternal Grandmother     Stroke Maternal Grandfather     Allergies  Allergen Reactions   Penicillins     Reaction in childhood        Latest Ref Rng & Units 06/21/2022    3:06 PM 06/15/2022   11:22 AM 01/17/2022    4:54 AM  CBC  WBC 4.0 - 10.5 K/uL 7.7   8.8   Hemoglobin 13.0 - 17.0 g/dL 12.3  13.9  8.1   Hematocrit 39.0 - 52.0 % 37.1  41.0  24.4   Platelets 150 - 400 K/uL 191   262       CMP     Component Value Date/Time   NA 137 06/21/2022 1506   K 3.8 06/21/2022 1506   CL 99 06/21/2022 1506   CO2 29 06/21/2022 1506   GLUCOSE 95 06/21/2022 1506   BUN 15 06/21/2022 1506   CREATININE 2.05 (H) 06/21/2022 1506   CALCIUM 8.6 (L) 06/21/2022 1506   PROT 8.0 06/21/2022 1506   ALBUMIN 3.9 06/21/2022 1506   AST 25 06/21/2022 1506   ALT 16 06/21/2022 1506   ALKPHOS 87 06/21/2022 1506   BILITOT 0.6 06/21/2022 1506   GFRNONAA 36 (L) 06/21/2022 1506     No results found.     Assessment & Plan:   1. ESRD (end stage renal disease) ([place]) The patient has a strong thrill and bruit however there is no evidence of stenosis near the arterial anastomosis.  There is also hematoma in place.  We will try to allow the patient several weeks to months resolve without issue.  We will have her return to repeat studies in 3 to 4 weeks. - VAS US DUPLEX DIALYSIS ACCESS (AVF, AVG)   Current Outpatient Medications on File Prior to Visit  Medication Sig Dispense Refill   aspirin 81 MG EC tablet Take 81 mg by mouth daily.     carvedilol (COREG) 25 MG tablet Take by mouth.     cyanocobalamin 1000 MCG tablet Take 1,000 mcg by mouth daily.     pantoprazole (PROTONIX) 40 MG tablet Take 1 tablet by mouth daily.     rosuvastatin (CRESTOR) 10 MG tablet Take 10 mg by mouth daily.     sacubitril-valsartan (ENTRESTO) 97-103 MG Take 1 tablet by mouth 2 (two) times daily.     sertraline (ZOLOFT) 100 MG tablet Take 100 mg by mouth daily.     tamsulosin (FLOMAX) 0.4 MG CAPS capsule Take 0.4 mg by mouth  daily.     No current  facility-administered medications on file prior to visit.    There are no Patient Instructions on file for this visit. No follow-ups on file.   Kris Hartmann, NP

## 2022-08-07 ENCOUNTER — Other Ambulatory Visit (INDEPENDENT_AMBULATORY_CARE_PROVIDER_SITE_OTHER): Payer: Self-pay | Admitting: Nurse Practitioner

## 2022-08-07 DIAGNOSIS — T148XXA Other injury of unspecified body region, initial encounter: Secondary | ICD-10-CM

## 2022-08-07 DIAGNOSIS — N186 End stage renal disease: Secondary | ICD-10-CM

## 2022-08-08 ENCOUNTER — Ambulatory Visit (INDEPENDENT_AMBULATORY_CARE_PROVIDER_SITE_OTHER): Payer: 59 | Admitting: Nurse Practitioner

## 2022-08-08 ENCOUNTER — Ambulatory Visit (INDEPENDENT_AMBULATORY_CARE_PROVIDER_SITE_OTHER): Payer: 59

## 2022-08-08 ENCOUNTER — Encounter (INDEPENDENT_AMBULATORY_CARE_PROVIDER_SITE_OTHER): Payer: Self-pay | Admitting: Nurse Practitioner

## 2022-08-08 VITALS — BP 116/71 | HR 68 | Resp 18 | Ht 67.0 in | Wt 187.2 lb

## 2022-08-08 DIAGNOSIS — T148XXA Other injury of unspecified body region, initial encounter: Secondary | ICD-10-CM

## 2022-08-08 DIAGNOSIS — E785 Hyperlipidemia, unspecified: Secondary | ICD-10-CM

## 2022-08-08 DIAGNOSIS — N186 End stage renal disease: Secondary | ICD-10-CM | POA: Diagnosis not present

## 2022-08-08 DIAGNOSIS — I10 Essential (primary) hypertension: Secondary | ICD-10-CM

## 2022-08-14 ENCOUNTER — Encounter (INDEPENDENT_AMBULATORY_CARE_PROVIDER_SITE_OTHER): Payer: Self-pay | Admitting: Nurse Practitioner

## 2022-08-14 NOTE — Progress Notes (Signed)
Subjective:    Patient ID: Randall [name], male    DOB: 1961-09-08, 61 y.o.   MRN: [id number] No chief complaint on file.   Randall [name] is a 61 year old male who presents today for follow-up of a right brachiocephalic AV fistula placement.  He denies any excessive pain or symptoms.  No significant swelling.  He notes that he has no pain or issues in his hand.  He is mostly progressing well.  He currently is maintained via PermCath and notes it is functioning well.  Previously the patient was noted to have a hematoma but this has resolved.  Today the patient has a flow volume of 1737.  Fistula appears to be patent throughout.    Review of Systems  All other systems reviewed and are negative.      Objective:   Physical Exam Vitals reviewed.  HENT:     Head: Normocephalic.  Cardiovascular:     Rate and Rhythm: Normal rate.     Pulses: Normal pulses.     Arteriovenous access: Right arteriovenous access is present.    Comments: Good thrill and bruit Pulmonary:     Effort: Pulmonary effort is normal.  Skin:    General: Skin is warm and dry.  Neurological:     Mental Status: He is alert and oriented to person, place, and time.  Psychiatric:        Mood and Affect: Mood normal.        Behavior: Behavior normal.        Thought Content: Thought content normal.        Judgment: Judgment normal.     BP 116/71 (BP Location: Left Arm)   Pulse 68   Resp 18   Ht 5\' 7"  (1.702 m)   Wt 187 lb 3.2 oz (84.9 kg)   BMI 29.32 kg/m   Past Medical History:  Diagnosis Date   Anemia    Anxiety    Arthritis    CHF (congestive heart failure) (HCC)    Chronic kidney disease    Dementia (HCC)    Depression    Diabetes mellitus without complication (HCC)    GERD (gastroesophageal reflux disease)    Heart murmur    Hypertension    Hypertensive urgency 12/09/2021   Pneumonia    Stroke [facility])     Social History   Socioeconomic History   Marital status: Divorced    Spouse  name: Not on file   Number of children: Not on file   Years of education: Not on file   Highest education level: Not on file  Occupational History   Not on file  Tobacco Use   Smoking status: Every Day    Packs/day: 0.25    Types: Cigars, Cigarettes   Smokeless tobacco: Not on file  Substance and Sexual Activity   Alcohol use: Yes    Alcohol/week: 1.0 standard drink of alcohol    Types: 1 Glasses of wine per week    Comment: rarely   Drug use: Never   Sexual activity: Not Currently  Other Topics Concern   Not on file  Social History Narrative   [name],Randall (Sister) lives with sister   Social Determinants of Health   Financial Resource Strain: Not on file  Food Insecurity: Not on file  Transportation Needs: Not on file  Physical Activity: Not on file  Stress: Not on file  Social Connections: Not on file  Intimate Partner Violence: Not on file    Past Surgical  History:  Procedure Laterality Date   AV FISTULA PLACEMENT Right 06/15/2022   Procedure: ARTERIOVENOUS (AV) FISTULA CREATION (BRACHIALCEPHALIC);  Surgeon: Algernon Huxley, MD;  Location: [facility] ORS;  Service: Vascular;  Laterality: Right;   COLONOSCOPY W/ POLYPECTOMY     dental work     DIALYSIS/PERMA CATHETER INSERTION N/A 12/30/2021   Procedure: DIALYSIS/PERMA CATHETER INSERTION;  Surgeon: Algernon Huxley, MD;  Location: [place] [facility];  Service: Cardiovascular;  Laterality: N/A;   ESOPHAGOGASTRODUODENOSCOPY (EGD) WITH PROPOFOL N/A 12/26/2021   Procedure: ESOPHAGOGASTRODUODENOSCOPY (EGD) WITH PROPOFOL;  Surgeon: Annamaria Helling, DO;  Location: [facility] ENDOSCOPY;  Service: Gastroenterology;  Laterality: N/A;   TEMPORARY DIALYSIS CATHETER N/A 12/27/2021   Procedure: TEMPORARY DIALYSIS CATHETER;  Surgeon: Katha Cabal, MD;  Location: [place] [facility];  Service: Cardiovascular;  Laterality: N/A;   TONSILLECTOMY     adeniods removed    Family History  Problem Relation Age of Onset   Hypertension Mother     Dementia Mother    Hypertension Maternal Grandmother    Diabetes Maternal Grandmother    Stroke Maternal Grandmother    Heart attack Maternal Grandmother    Stroke Maternal Grandfather     Allergies  Allergen Reactions   Penicillins     Reaction in childhood        Latest Ref Rng & Units 06/21/2022    3:06 PM 06/15/2022   11:22 AM 01/17/2022    4:54 AM  CBC  WBC 4.0 - 10.5 K/uL 7.7   8.8   Hemoglobin 13.0 - 17.0 g/dL 12.3  13.9  8.1   Hematocrit 39.0 - 52.0 % 37.1  41.0  24.4   Platelets 150 - 400 K/uL 191   262       CMP     Component Value Date/Time   NA 137 06/21/2022 1506   K 3.8 06/21/2022 1506   CL 99 06/21/2022 1506   CO2 29 06/21/2022 1506   GLUCOSE 95 06/21/2022 1506   BUN 15 06/21/2022 1506   CREATININE 2.05 (H) 06/21/2022 1506   CALCIUM 8.6 (L) 06/21/2022 1506   PROT 8.0 06/21/2022 1506   ALBUMIN 3.9 06/21/2022 1506   AST 25 06/21/2022 1506   ALT 16 06/21/2022 1506   ALKPHOS 87 06/21/2022 1506   BILITOT 0.6 06/21/2022 1506   GFRNONAA 36 (L) 06/21/2022 1506     No results found.     Assessment & Plan:   1. ESRD (end stage renal disease) ([place]) Today noninvasive studies show that the patient has adequate dialysis access.  We will fax a letter to his dialysis center indicating that it is available for use.  No evidence of significant stenosis noted.  We will have the patient return in 6 months with noninvasive studies or sooner if issues arise. - VAS US DUPLEX DIALYSIS ACCESS (AVF, AVG)  2. Hematoma Resolved - VAS US DUPLEX DIALYSIS ACCESS (AVF, AVG)  3. Hyperlipidemia, unspecified hyperlipidemia type Continue statin as ordered and reviewed, no changes at this time   4. Essential hypertension Continue antihypertensive medications as already ordered, these medications have been reviewed and there are no changes at this time.    Current Outpatient Medications on File Prior to Visit  Medication Sig Dispense Refill   aspirin 81 MG EC tablet  Take 81 mg by mouth daily.     carvedilol (COREG) 25 MG tablet Take by mouth.     cholecalciferol (VITAMIN D3) 25 MCG (1000 UNIT) tablet Take 1,000 Units by mouth  daily.     cyanocobalamin 1000 MCG tablet Take 1,000 mcg by mouth daily.     pantoprazole (PROTONIX) 40 MG tablet Take 1 tablet by mouth daily.     rosuvastatin (CRESTOR) 10 MG tablet Take 10 mg by mouth daily.     sertraline (ZOLOFT) 100 MG tablet Take 100 mg by mouth daily.     tamsulosin (FLOMAX) 0.4 MG CAPS capsule Take 0.4 mg by mouth daily.     thiamine (VITAMIN B1) 100 MG tablet Take 1 tablet by mouth daily.     sacubitril-valsartan (ENTRESTO) 97-103 MG Take 1 tablet by mouth 2 (two) times daily. (Patient not taking: Reported on 08/08/2022)     No current facility-administered medications on file prior to visit.    There are no Patient Instructions on file for this visit. No follow-ups on file.   Kris Hartmann, NP

## 2022-10-03 ENCOUNTER — Ambulatory Visit: Payer: Medicaid Other | Admitting: Podiatry

## 2022-10-19 ENCOUNTER — Ambulatory Visit
Admission: RE | Admit: 2022-10-19 | Discharge: 2022-10-19 | Disposition: A | Discharge status: Home / Self Care | Payer: Medicare Other | Attending: Vascular Surgery | Admitting: Vascular Surgery

## 2022-10-19 ENCOUNTER — Encounter
Admission: RE | Disposition: A | Discharge status: Home / Self Care | Payer: Self-pay | Source: Home / Self Care | Attending: Vascular Surgery

## 2022-10-19 DIAGNOSIS — Z95828 Presence of other vascular implants and grafts: Secondary | ICD-10-CM | POA: Diagnosis not present

## 2022-10-19 DIAGNOSIS — Z992 Dependence on renal dialysis: Secondary | ICD-10-CM | POA: Diagnosis not present

## 2022-10-19 DIAGNOSIS — I509 Heart failure, unspecified: Secondary | ICD-10-CM | POA: Insufficient documentation

## 2022-10-19 DIAGNOSIS — N186 End stage renal disease: Secondary | ICD-10-CM | POA: Diagnosis not present

## 2022-10-19 DIAGNOSIS — I132 Hypertensive heart and chronic kidney disease with heart failure and with stage 5 chronic kidney disease, or end stage renal disease: Secondary | ICD-10-CM | POA: Insufficient documentation

## 2022-10-19 DIAGNOSIS — I120 Hypertensive chronic kidney disease with stage 5 chronic kidney disease or end stage renal disease: Secondary | ICD-10-CM | POA: Diagnosis not present

## 2022-10-19 DIAGNOSIS — E1122 Type 2 diabetes mellitus with diabetic chronic kidney disease: Secondary | ICD-10-CM | POA: Insufficient documentation

## 2022-10-19 DIAGNOSIS — Z4901 Encounter for fitting and adjustment of extracorporeal dialysis catheter: Secondary | ICD-10-CM | POA: Insufficient documentation

## 2022-10-19 DIAGNOSIS — F17290 Nicotine dependence, other tobacco product, uncomplicated: Secondary | ICD-10-CM | POA: Diagnosis not present

## 2022-10-19 DIAGNOSIS — F17210 Nicotine dependence, cigarettes, uncomplicated: Secondary | ICD-10-CM | POA: Diagnosis not present

## 2022-10-19 HISTORY — PX: DIALYSIS/PERMA CATHETER REMOVAL: CATH118289

## 2022-10-19 SURGERY — DIALYSIS/PERMA CATHETER REMOVAL
Anesthesia: LOCAL

## 2022-10-19 SURGICAL SUPPLY — 2 items
FORCEPS HALSTEAD CVD 5IN STRL (INSTRUMENTS) IMPLANT
TRAY LACERAT/PLASTIC (MISCELLANEOUS) IMPLANT

## 2022-10-19 NOTE — H&P (Signed)
[facility] Admission History & Physical  MRN : [id number]  ANTHEM FRAZER is a 61 y.o. ([birth year]) male who presents with chief complaint of No chief complaint on file. Marland Kitchen  History of Present Illness: I am asked to evaluate the patient by the dialysis center. The patient was sent here because they have a nonfunctioning tunneled catheter and a functioning AVF.  The patient reports they're not been any problems with any of their dialysis runs. They are reporting good flows with good parameters at dialysis.   Patient denies pain or tenderness overlying the access.  There is no pain with dialysis.  The patient denies hand pain or finger pain consistent with steal syndrome.  No fevers or chills while on dialysis.    No current facility-administered medications for this encounter.    Past Medical History:  Diagnosis Date   Anemia    Anxiety    Arthritis    CHF (congestive heart failure) (HCC)    Chronic kidney disease    Dementia ([place])    Depression    Diabetes mellitus without complication (HCC)    GERD (gastroesophageal reflux disease)    Heart murmur    Hypertension    Hypertensive urgency 12/09/2021   Pneumonia    Stroke [facility])     Past Surgical History:  Procedure Laterality Date   AV FISTULA PLACEMENT Right 06/15/2022   Procedure: ARTERIOVENOUS (AV) FISTULA CREATION (BRACHIALCEPHALIC);  Surgeon: Algernon Huxley, MD;  Location: [facility] ORS;  Service: Vascular;  Laterality: Right;   COLONOSCOPY W/ POLYPECTOMY     dental work     DIALYSIS/PERMA CATHETER INSERTION N/A 12/30/2021   Procedure: DIALYSIS/PERMA CATHETER INSERTION;  Surgeon: Algernon Huxley, MD;  Location: [place] [facility];  Service: Cardiovascular;  Laterality: N/A;   ESOPHAGOGASTRODUODENOSCOPY (EGD) WITH PROPOFOL N/A 12/26/2021   Procedure: ESOPHAGOGASTRODUODENOSCOPY (EGD) WITH PROPOFOL;  Surgeon: Annamaria Helling, DO;  Location: [facility] ENDOSCOPY;  Service: Gastroenterology;  Laterality:  N/A;   TEMPORARY DIALYSIS CATHETER N/A 12/27/2021   Procedure: TEMPORARY DIALYSIS CATHETER;  Surgeon: Katha Cabal, MD;  Location: [place] [facility];  Service: Cardiovascular;  Laterality: N/A;   TONSILLECTOMY     adeniods removed    Social History   Tobacco Use   Smoking status: Every Day    Packs/day: 0.25    Types: Cigars, Cigarettes  Substance Use Topics   Alcohol use: Yes    Alcohol/week: 1.0 standard drink of alcohol    Types: 1 Glasses of wine per week    Comment: rarely   Drug use: Never    Family History  Problem Relation Age of Onset   Hypertension Mother    Dementia Mother    Hypertension Maternal Grandmother    Diabetes Maternal Grandmother    Stroke Maternal Grandmother    Heart attack Maternal Grandmother    Stroke Maternal Grandfather     No family history of bleeding or clotting disorders, autoimmune disease or porphyria  Allergies  Allergen Reactions   Penicillins     Reaction in childhood      REVIEW OF SYSTEMS (Negative unless checked)  Constitutional: [] Weight loss  [] Fever  [] Chills Cardiac: [] Chest pain   [] Chest pressure   [] Palpitations   [] Shortness of breath when laying flat   [] Shortness of breath at rest   [x] Shortness of breath with exertion. Vascular:  [] Pain in legs with walking   [] Pain in legs at rest   [] Pain in legs when laying flat   []   Claudication   [] Pain in feet when walking  [] Pain in feet at rest  [] Pain in feet when laying flat   [] History of DVT   [] Phlebitis   [] Swelling in legs   [] Varicose veins   [] Non-healing ulcers Pulmonary:   [] Uses home oxygen   [] Productive cough   [] Hemoptysis   [] Wheeze  [] COPD   [] Asthma Neurologic:  [] Dizziness  [] Blackouts   [] Seizures   [x] History of stroke   [] History of TIA  [] Aphasia   [] Temporary blindness   [] Dysphagia   [] Weakness or numbness in arms   [] Weakness or numbness in legs Musculoskeletal:  [x] Arthritis   [] Joint swelling   [] Joint pain   [] Low back pain Hematologic:   [] Easy bruising  [] Easy bleeding   [] Hypercoagulable state   [x] Anemic  [] Hepatitis Gastrointestinal:  [] Blood in stool   [] Vomiting blood  [x] Gastroesophageal reflux/heartburn   [] Difficulty swallowing. Genitourinary:  [x] Chronic kidney disease   [] Difficult urination  [] Frequent urination  [] Burning with urination   [] Blood in urine Skin:  [] Rashes   [] Ulcers   [] Wounds Psychological:  [] History of anxiety   []  History of major depression.  Physical Examination  Vitals:   10/19/22 1118  BP: (!) 166/69  Pulse: 65  Resp: 15  Temp: 98.6 F (37 C)  TempSrc: Oral  SpO2: 98%   There is no height or weight on file to calculate BMI. Gen: WD/WN, NAD Head: [place]/AT, No temporalis wasting. Prominent temp pulse not noted. Ear/Nose/Throat: Hearing grossly intact, nares w/o erythema or drainage, oropharynx w/o Erythema/Exudate,  Eyes: Conjunctiva clear, sclera non-icteric Neck: Trachea midline.  No JVD.  Pulmonary:  Good air movement, respirations not labored, no use of accessory muscles.  Cardiac: RRR, normal S1, S2. Vascular: thrill present in AVF Vessel Right Left  Radial Palpable Palpable   Musculoskeletal: M/S 5/5 throughout.  Extremities without ischemic changes.  No deformity or atrophy.  Neurologic: Sensation grossly intact in extremities.  Symmetrical.  Speech is fluent. Motor exam as listed above. Psychiatric: Judgment intact, Mood & affect appropriate for pt's clinical situation. Dermatologic: No rashes or ulcers noted.  No cellulitis or open wounds.    CBC Lab Results  Component Value Date   WBC 7.7 06/21/2022   HGB 12.3 (L) 06/21/2022   HCT 37.1 (L) 06/21/2022   MCV 96.1 06/21/2022   PLT 191 06/21/2022    BMET    Component Value Date/Time   NA 137 06/21/2022 1506   K 3.8 06/21/2022 1506   CL 99 06/21/2022 1506   CO2 29 06/21/2022 1506   GLUCOSE 95 06/21/2022 1506   BUN 15 06/21/2022 1506   CREATININE 2.05 (H) 06/21/2022 1506   CALCIUM 8.6 (L) 06/21/2022 1506    GFRNONAA 36 (L) 06/21/2022 1506   CrCl cannot be calculated (Patient's most recent lab result is older than the maximum 21 days allowed.).  COAG Lab Results  Component Value Date   INR 1.1 06/13/2022    Radiology No results found.  Assessment/Plan 1.  Complication dialysis device:  Patient's Tunneled catheter is not being used. The patient has an extremity access that is functioning well. Therefore, the patient will undergo removal of the tunneled catheter under local anesthesia.  The risks and benefits were described to the patient.  All questions were answered.  The patient agrees to proceed with angiography and intervention. Potassium will be drawn to ensure that it is an appropriate level prior to performing intervention. 2.  End-stage renal disease requiring hemodialysis:  Patient will continue dialysis  therapy without further interruption 3.  Hypertension:  Patient will continue medical management; nephrology is following no changes in oral medications. 4. Diabetes mellitus:  Glucose will be monitored and oral medications been held this morning once the patient has undergone the patient's procedure po intake will be reinitiated and again Accu-Cheks will be used to assess the blood glucose level and treat as needed. The patient will be restarted on the patient's usual hypoglycemic regime     [name], MD  10/19/2022 11:31 AM

## 2022-10-19 NOTE — Op Note (Signed)
Operative Note     Preoperative diagnosis:   1. ESRD with functional permanent access  Postoperative diagnosis:  1. ESRD with functional permanent access  Procedure:  Removal of right jugular Permcath  Surgeon:  [name], MD  Assistant: Annalee Genta, NP  Anesthesia:  Local  EBL:  Minimal  Indication for the Procedure:  The patient has a functional permanent dialysis access and no longer needs their permcath.  This can be removed.  Risks and benefits are discussed and informed consent is obtained.  Description of the Procedure:  The patient's right neck, chest and existing catheter were sterilely prepped and draped. The area around the catheter was anesthetized copiously with 1% lidocaine. The catheter was dissected out with curved hemostats until the cuff was freed from the surrounding fibrous sheath. The fiber sheath was transected, and the catheter was then removed in its entirety using gentle traction. Pressure was held and sterile dressings were placed. The patient tolerated the procedure well and was taken to the recovery room in stable condition.     [name]  10/19/2022, 12:55 PM This note was created with Dragon Medical transcription system. Any errors in dictation are purely unintentional.

## 2022-10-20 ENCOUNTER — Encounter: Payer: Self-pay | Admitting: Vascular Surgery

## 2023-01-15 ENCOUNTER — Other Ambulatory Visit: Payer: Self-pay

## 2023-01-15 ENCOUNTER — Emergency Department: Payer: Medicare HMO

## 2023-01-15 ENCOUNTER — Emergency Department
Admission: EM | Admit: 2023-01-15 | Discharge: 2023-01-15 | Disposition: A | Discharge status: Home / Self Care | Payer: Medicare HMO | Attending: Emergency Medicine | Admitting: Emergency Medicine

## 2023-01-15 ENCOUNTER — Encounter: Payer: Self-pay | Admitting: *Deleted

## 2023-01-15 DIAGNOSIS — I132 Hypertensive heart and chronic kidney disease with heart failure and with stage 5 chronic kidney disease, or end stage renal disease: Secondary | ICD-10-CM | POA: Diagnosis not present

## 2023-01-15 DIAGNOSIS — N186 End stage renal disease: Secondary | ICD-10-CM | POA: Diagnosis not present

## 2023-01-15 DIAGNOSIS — Z992 Dependence on renal dialysis: Secondary | ICD-10-CM | POA: Insufficient documentation

## 2023-01-15 DIAGNOSIS — I509 Heart failure, unspecified: Secondary | ICD-10-CM | POA: Insufficient documentation

## 2023-01-15 DIAGNOSIS — Z20822 Contact with and (suspected) exposure to covid-19: Secondary | ICD-10-CM | POA: Diagnosis not present

## 2023-01-15 DIAGNOSIS — Z91148 Patient's other noncompliance with medication regimen for other reason: Secondary | ICD-10-CM | POA: Insufficient documentation

## 2023-01-15 DIAGNOSIS — R531 Weakness: Secondary | ICD-10-CM | POA: Insufficient documentation

## 2023-01-15 DIAGNOSIS — R5383 Other fatigue: Secondary | ICD-10-CM

## 2023-01-15 DIAGNOSIS — Z8673 Personal history of transient ischemic attack (TIA), and cerebral infarction without residual deficits: Secondary | ICD-10-CM | POA: Diagnosis not present

## 2023-01-15 DIAGNOSIS — I10 Essential (primary) hypertension: Secondary | ICD-10-CM

## 2023-01-15 LAB — CBC
HCT: 34.7 % — ABNORMAL LOW (ref 39.0–52.0)
Hemoglobin: 11.5 g/dL — ABNORMAL LOW (ref 13.0–17.0)
MCH: 31.8 pg (ref 26.0–34.0)
MCHC: 33.1 g/dL (ref 30.0–36.0)
MCV: 95.9 fL (ref 80.0–100.0)
Platelets: 180 10*3/uL (ref 150–400)
RBC: 3.62 MIL/uL — ABNORMAL LOW (ref 4.22–5.81)
RDW: 13.4 % (ref 11.5–15.5)
WBC: 5.9 10*3/uL (ref 4.0–10.5)
nRBC: 0 % (ref 0.0–0.2)

## 2023-01-15 LAB — BASIC METABOLIC PANEL
Anion gap: 6 (ref 5–15)
BUN: 17 mg/dL (ref 8–23)
CO2: 28 mmol/L (ref 22–32)
Calcium: 8.5 mg/dL — ABNORMAL LOW (ref 8.9–10.3)
Chloride: 100 mmol/L (ref 98–111)
Creatinine, Ser: 2.14 mg/dL — ABNORMAL HIGH (ref 0.61–1.24)
GFR, Estimated: 34 mL/min — ABNORMAL LOW (ref >60)
Glucose, Bld: 90 mg/dL (ref 70–99)
Potassium: 3.4 mmol/L — ABNORMAL LOW (ref 3.5–5.1)
Sodium: 134 mmol/L — ABNORMAL LOW (ref 135–145)

## 2023-01-15 LAB — CBG MONITORING, ED: Glucose-Capillary: 89 mg/dL (ref 70–99)

## 2023-01-15 LAB — HEPATIC FUNCTION PANEL
ALT: 13 U/L (ref 0–44)
AST: 21 U/L (ref 15–41)
Albumin: 3.8 g/dL (ref 3.5–5.0)
Alkaline Phosphatase: 79 U/L (ref 38–126)
Bilirubin, Direct: 0.1 mg/dL (ref 0.0–0.2)
Indirect Bilirubin: 0.5 mg/dL (ref 0.3–0.9)
Total Bilirubin: 0.6 mg/dL (ref 0.3–1.2)
Total Protein: 7.7 g/dL (ref 6.5–8.1)

## 2023-01-15 LAB — TROPONIN I (HIGH SENSITIVITY)
Troponin I (High Sensitivity): 33 ng/L — ABNORMAL HIGH (ref <18)
Troponin I (High Sensitivity): 36 ng/L — ABNORMAL HIGH (ref <18)

## 2023-01-15 LAB — MAGNESIUM: Magnesium: 1.8 mg/dL (ref 1.7–2.4)

## 2023-01-15 LAB — RESP PANEL BY RT-PCR (RSV, FLU A&B, COVID)  RVPGX2
Influenza A by PCR: NEGATIVE
Influenza B by PCR: NEGATIVE
Resp Syncytial Virus by PCR: NEGATIVE
SARS Coronavirus 2 by RT PCR: NEGATIVE

## 2023-01-15 MED ORDER — CARVEDILOL 25 MG PO TABS
25.0000 mg | ORAL_TABLET | Freq: Once | ORAL | Status: AC
  Administered 2023-01-15: 25 mg via ORAL
  Filled 2023-01-15: qty 1

## 2023-01-15 MED ORDER — LABETALOL HCL 5 MG/ML IV SOLN
10.0000 mg | Freq: Once | INTRAVENOUS | Status: AC
  Administered 2023-01-15: 10 mg via INTRAVENOUS
  Filled 2023-01-15: qty 4

## 2023-01-15 NOTE — ED Provider Notes (Signed)
[facility] Provider Note    Event Date/Time   First MD Initiated Contact with Patient 01/15/23 1749     (approximate)   History   Dizziness and Weakness   HPI  Randall [name] is a 62 y.o. male   Past medical history of hypertension, CHF, end-stage renal diet disease on hemodialysis on Monday Wednesday Friday, GERD, prior stroke presents to the emergency department with generalized weakness.  Has felt very fatigued today.  Awoke feeling normal and skipped breakfast on the way to hemodialysis today.  Wife noticed that he was nodding off and appearing very tired.  No focality to his weakness.  No trauma. Chest pain, abdominal pain nausea vomiting or diarrhea.  He denies respiratory infectious symptoms GI or GU complaints.  He makes minimal urine. Other acute medical complaints.  Tolerated dialysis today without any issues.  Independent Historian contributed to assessment above: His wife who is at bedside       Physical Exam   Triage Vital Signs: ED Triage Vitals  Enc Vitals Group     BP 01/15/23 1704 (!) 177/91     Pulse Rate 01/15/23 1704 63     Resp 01/15/23 1704 20     Temp 01/15/23 1704 98.2 F (36.8 C)     Temp Source 01/15/23 1704 Oral     SpO2 01/15/23 1704 100 %     Weight 01/15/23 1700 185 lb 3 oz (84 kg)     Height 01/15/23 1700 '5\' 7"'$  (1.702 m)     Head Circumference --      Peak Flow --      Pain Score 01/15/23 1700 0     Pain Loc --      Pain Edu? --      Excl. in [place]? --     Most recent vital signs: Vitals:   01/15/23 2043 01/15/23 2106  BP: (!) 194/85 (!) 190/82  Pulse: 75 68  Resp: 18 15  Temp:    SpO2: 100% 100%    General: Awake, no distress.  CV:  Good peripheral perfusion.  Resp:  Normal effort.  Abd:  No distention.  Other:  Awake alert tired appearing but conversant, moving all extremities with equal strength sensation intact no facial asymmetry no dysarthria normal finger-nose.  Hypertensive [id number] to [id number]  systolic.  Afebrile.  Abdomen soft nontender lungs are clear.   ED Results / Procedures / Treatments   Labs (all labs ordered are listed, but only abnormal results are displayed) Labs Reviewed  BASIC METABOLIC PANEL - Abnormal; Notable for the following components:      Result Value   Sodium 134 (*)    Potassium 3.4 (*)    Creatinine, Ser 2.14 (*)    Calcium 8.5 (*)    GFR, Estimated 34 (*)    All other components within normal limits  CBC - Abnormal; Notable for the following components:   RBC 3.62 (*)    Hemoglobin 11.5 (*)    HCT 34.7 (*)    All other components within normal limits  TROPONIN I (HIGH SENSITIVITY) - Abnormal; Notable for the following components:   Troponin I (High Sensitivity) 33 (*)    All other components within normal limits  TROPONIN I (HIGH SENSITIVITY) - Abnormal; Notable for the following components:   Troponin I (High Sensitivity) 36 (*)    All other components within normal limits  RESP PANEL BY RT-PCR (RSV, FLU A&B, COVID)  [id number]  HEPATIC FUNCTION  PANEL  MAGNESIUM  CBG MONITORING, ED     I ordered and reviewed the above labs they are notable for creatinine at baseline, electrolytes otherwise largely unremarkable and H&H at baseline.  EKG  ED ECG REPORT I, Lucillie Garfinkel, the attending physician, personally viewed and interpreted this ECG.   Date: 01/15/2023  EKG Time: 1720  Rate: 63  Rhythm: nsr  Axis: nl  Intervals:none  ST&T Change: no stemi    RADIOLOGY I independently reviewed and interpreted CT of the head see no obvious bleeding or midline shift   PROCEDURES:  Critical Care performed: No  Procedures   MEDICATIONS ORDERED IN ED: Medications  labetalol (NORMODYNE) injection 10 mg (10 mg Intravenous Given 01/15/23 2048)     IMPRESSION / MDM / ASSESSMENT AND PLAN / ED COURSE  I reviewed the triage vital signs and the nursing notes.                                Patient's presentation is most consistent with acute  presentation with potential threat to life or bodily function.  Differential diagnosis includes, but is not limited to, hypoglycemia or other electrolyte disturbance, hypertensive emergency, head bleed, ACS, deconditioning, pneumonia   The patient is on the cardiac monitor to evaluate for evidence of arrhythmia and/or significant heart rate changes.  MDM: Patient with generalized fatigue for this morning poor p.o. intake all day.  No focality or other focal infectious symptoms or pain.  Answering questions appropriately here with family member at bedside.  I doubt ACS given no chest pain, doubt stroke given no focality.  Triage note states dizziness but the patient denies dizziness feeling just generalized fatigue and there is no discoordination on my exam normal finger-to-nose, no complaints of imbalance, vertiginous sx or other focal complaints; I doubt CVA.  CT of the head shows no acute abnormalities.  Troponins have been flat x 2.  EKG is nonischemic.  Labs largely within normal limits, though he is hypertensive he states that he does not take his antihypertensives on days he has dialysis.  I will give him a dose of IV labetalol and his home medications.  He has not eaten all day and this may contribute to his symptoms.   I considered hospitalization for admission or observation however given his evaluation which was unrevealing for any emergent pathology as above and generalized fatigue for 1 day and adequate follow-up that can be established by this patient and his family member at bedside, I think discharge with outpatient follow-up at this time is most appropriate.  His blood pressure is high but he did not take his blood pressure medications today we will medicate the emergency department prior to discharge recheck.        FINAL CLINICAL IMPRESSION(S) / ED DIAGNOSES   Final diagnoses:  Other fatigue  Uncontrolled hypertension     Rx / DC Orders   ED Discharge Orders      None        Note:  This document was prepared using Dragon voice recognition software and may include unintentional dictation errors.    Lucillie Garfinkel, MD 01/15/23 2109    Lucillie Garfinkel, MD 01/16/23 [place]

## 2023-01-15 NOTE — ED Notes (Signed)
Patient transported to CT 

## 2023-01-15 NOTE — ED Triage Notes (Signed)
Arrives from dialysis via [facility]>  EMS called for hypoglycemia, CBG:  104.   Patietn c/o feeling generally sick.  198/82 BP.  P:  61

## 2023-01-15 NOTE — ED Notes (Signed)
Fsbs 89

## 2023-01-15 NOTE — ED Notes (Signed)
Pt returned from CT °

## 2023-01-15 NOTE — Discharge Instructions (Addendum)
Take your blood pressure medications as prescribed and follow-up with your doctor to recheck your blood pressure and medication management as needed.  Thank you for choosing [place] for your health care today!  Please see your primary doctor this week for a follow up appointment.   Sometimes, in the early stages of certain disease courses it is difficult to detect in the emergency department evaluation -- so, it is important that you continue to monitor your symptoms and call your doctor right away or return to the emergency department if you develop any new or worsening symptoms.  Please go to the following website to schedule new (and existing) patient appointments:   [email or web address]  If you do not have a primary doctor try calling the following clinics to establish care:  If you have insurance:  [facility] [phone] [place] [address]   [facility]  [phone] [address] [place] [address]   If you do not have insurance:  [facility]  [phone] [address] [place] [address]   The following is another list of primary care offices in the area who are accepting new patients at this time.  Please reach out to one of them directly and let them know you would like to schedule an appointment to follow up on an Emergency Department visit, and/or to establish a new primary care provider (PCP).  There are likely other primary care clinics in the are who are accepting new patients, but this is an excellent place to start:  [place] physician: Dr Lavon Paganini [address] [place], [place] [address] [phone]  [facility] Lead Physician: Dr Steele Sizer [address], [place], [place] [address] [phone]  [place] Physician: Dr Park Liter [address] [place], [place] [address] [phone]  [facility] Lead Physician: Dr Dewaine Oats [place], [place], [place] [address] [phone]  [facility] Physician: Dr Halina Maidens [address] [place],  [address] [phone]   It was my pleasure to care for you today.   [place] Brunette Jacelyn Grip, MD

## 2023-01-15 NOTE — ED Triage Notes (Signed)
Pt brought in via ems from dialysis.  Pt c/o weakness.  Fsbs was low at dialysis.  Pt denies any pain.  Pt completed dialysis.  Family with pt.

## 2023-01-30 ENCOUNTER — Other Ambulatory Visit (INDEPENDENT_AMBULATORY_CARE_PROVIDER_SITE_OTHER): Payer: Self-pay | Admitting: Nurse Practitioner

## 2023-01-30 DIAGNOSIS — N186 End stage renal disease: Secondary | ICD-10-CM

## 2023-02-04 ENCOUNTER — Observation Stay: Payer: Medicare HMO

## 2023-02-04 ENCOUNTER — Emergency Department: Payer: Medicare HMO

## 2023-02-04 ENCOUNTER — Observation Stay
Admission: EM | Admit: 2023-02-04 | Discharge: 2023-02-08 | Disposition: A | Discharge status: Home Health | Payer: Medicare HMO | Attending: Internal Medicine | Admitting: Internal Medicine

## 2023-02-04 DIAGNOSIS — F03A0 Unspecified dementia, mild, without behavioral disturbance, psychotic disturbance, mood disturbance, and anxiety: Secondary | ICD-10-CM

## 2023-02-04 DIAGNOSIS — Z87891 Personal history of nicotine dependence: Secondary | ICD-10-CM | POA: Insufficient documentation

## 2023-02-04 DIAGNOSIS — Z8616 Personal history of COVID-19: Secondary | ICD-10-CM | POA: Diagnosis not present

## 2023-02-04 DIAGNOSIS — F02818 Dementia in other diseases classified elsewhere, unspecified severity, with other behavioral disturbance: Secondary | ICD-10-CM | POA: Insufficient documentation

## 2023-02-04 DIAGNOSIS — M6281 Muscle weakness (generalized): Secondary | ICD-10-CM | POA: Diagnosis not present

## 2023-02-04 DIAGNOSIS — Z7982 Long term (current) use of aspirin: Secondary | ICD-10-CM | POA: Diagnosis not present

## 2023-02-04 DIAGNOSIS — Z79899 Other long term (current) drug therapy: Secondary | ICD-10-CM | POA: Insufficient documentation

## 2023-02-04 DIAGNOSIS — G9340 Encephalopathy, unspecified: Secondary | ICD-10-CM | POA: Diagnosis not present

## 2023-02-04 DIAGNOSIS — G9341 Metabolic encephalopathy: Secondary | ICD-10-CM | POA: Diagnosis present

## 2023-02-04 DIAGNOSIS — R262 Difficulty in walking, not elsewhere classified: Secondary | ICD-10-CM | POA: Diagnosis not present

## 2023-02-04 DIAGNOSIS — I5022 Chronic systolic (congestive) heart failure: Secondary | ICD-10-CM | POA: Diagnosis not present

## 2023-02-04 DIAGNOSIS — Z8673 Personal history of transient ischemic attack (TIA), and cerebral infarction without residual deficits: Secondary | ICD-10-CM

## 2023-02-04 DIAGNOSIS — Z9181 History of falling: Secondary | ICD-10-CM | POA: Diagnosis not present

## 2023-02-04 DIAGNOSIS — Z992 Dependence on renal dialysis: Secondary | ICD-10-CM

## 2023-02-04 DIAGNOSIS — I132 Hypertensive heart and chronic kidney disease with heart failure and with stage 5 chronic kidney disease, or end stage renal disease: Secondary | ICD-10-CM | POA: Diagnosis not present

## 2023-02-04 DIAGNOSIS — R29810 Facial weakness: Secondary | ICD-10-CM | POA: Diagnosis not present

## 2023-02-04 DIAGNOSIS — F03918 Unspecified dementia, unspecified severity, with other behavioral disturbance: Secondary | ICD-10-CM | POA: Diagnosis present

## 2023-02-04 DIAGNOSIS — U071 COVID-19: Secondary | ICD-10-CM | POA: Diagnosis not present

## 2023-02-04 DIAGNOSIS — R2681 Unsteadiness on feet: Secondary | ICD-10-CM | POA: Insufficient documentation

## 2023-02-04 DIAGNOSIS — I10 Essential (primary) hypertension: Secondary | ICD-10-CM | POA: Diagnosis present

## 2023-02-04 DIAGNOSIS — E1122 Type 2 diabetes mellitus with diabetic chronic kidney disease: Secondary | ICD-10-CM | POA: Insufficient documentation

## 2023-02-04 DIAGNOSIS — E871 Hypo-osmolality and hyponatremia: Secondary | ICD-10-CM | POA: Insufficient documentation

## 2023-02-04 DIAGNOSIS — R531 Weakness: Secondary | ICD-10-CM | POA: Diagnosis present

## 2023-02-04 DIAGNOSIS — F0390 Unspecified dementia without behavioral disturbance: Secondary | ICD-10-CM | POA: Diagnosis present

## 2023-02-04 DIAGNOSIS — N186 End stage renal disease: Secondary | ICD-10-CM | POA: Diagnosis not present

## 2023-02-04 DIAGNOSIS — E119 Type 2 diabetes mellitus without complications: Secondary | ICD-10-CM

## 2023-02-04 DIAGNOSIS — R55 Syncope and collapse: Secondary | ICD-10-CM | POA: Insufficient documentation

## 2023-02-04 LAB — BASIC METABOLIC PANEL
Anion gap: 11 (ref 5–15)
BUN: 29 mg/dL — ABNORMAL HIGH (ref 8–23)
CO2: 28 mmol/L (ref 22–32)
Calcium: 8.6 mg/dL — ABNORMAL LOW (ref 8.9–10.3)
Chloride: 95 mmol/L — ABNORMAL LOW (ref 98–111)
Creatinine, Ser: 3.94 mg/dL — ABNORMAL HIGH (ref 0.61–1.24)
GFR, Estimated: 17 mL/min — ABNORMAL LOW (ref >60)
Glucose, Bld: 111 mg/dL — ABNORMAL HIGH (ref 70–99)
Potassium: 3.8 mmol/L (ref 3.5–5.1)
Sodium: 134 mmol/L — ABNORMAL LOW (ref 135–145)

## 2023-02-04 LAB — CBC WITH DIFFERENTIAL/PLATELET
Abs Immature Granulocytes: 0.01 10*3/uL (ref 0.00–0.07)
Basophils Absolute: 0 10*3/uL (ref 0.0–0.1)
Basophils Relative: 1 %
Eosinophils Absolute: 0.1 10*3/uL (ref 0.0–0.5)
Eosinophils Relative: 3 %
HCT: 32.8 % — ABNORMAL LOW (ref 39.0–52.0)
Hemoglobin: 10.9 g/dL — ABNORMAL LOW (ref 13.0–17.0)
Immature Granulocytes: 0 %
Lymphocytes Relative: 18 %
Lymphs Abs: 0.9 10*3/uL (ref 0.7–4.0)
MCH: 31.7 pg (ref 26.0–34.0)
MCHC: 33.2 g/dL (ref 30.0–36.0)
MCV: 95.3 fL (ref 80.0–100.0)
Monocytes Absolute: 1 10*3/uL (ref 0.1–1.0)
Monocytes Relative: 19 %
Neutro Abs: 3.1 10*3/uL (ref 1.7–7.7)
Neutrophils Relative %: 59 %
Platelets: 203 10*3/uL (ref 150–400)
RBC: 3.44 MIL/uL — ABNORMAL LOW (ref 4.22–5.81)
RDW: 13.4 % (ref 11.5–15.5)
WBC: 5.1 10*3/uL (ref 4.0–10.5)
nRBC: 0 % (ref 0.0–0.2)

## 2023-02-04 LAB — TROPONIN I (HIGH SENSITIVITY)
Troponin I (High Sensitivity): 45 ng/L — ABNORMAL HIGH (ref <18)
Troponin I (High Sensitivity): 45 ng/L — ABNORMAL HIGH (ref <18)

## 2023-02-04 LAB — RESP PANEL BY RT-PCR (RSV, FLU A&B, COVID)  RVPGX2
Influenza A by PCR: NEGATIVE
Influenza B by PCR: NEGATIVE
Resp Syncytial Virus by PCR: NEGATIVE
SARS Coronavirus 2 by RT PCR: POSITIVE — AB

## 2023-02-04 LAB — LIPID PANEL
Cholesterol: 149 mg/dL (ref 0–200)
HDL: 47 mg/dL (ref >40)
LDL Cholesterol: 86 mg/dL (ref 0–99)
Total CHOL/HDL Ratio: 3.2 RATIO
Triglycerides: 82 mg/dL (ref <150)
VLDL: 16 mg/dL (ref 0–40)

## 2023-02-04 LAB — GLUCOSE, CAPILLARY
Glucose-Capillary: 76 mg/dL (ref 70–99)
Glucose-Capillary: 73 mg/dL (ref 70–99)

## 2023-02-04 MED ORDER — ONDANSETRON HCL 4 MG PO TABS: 4.0000 mg | ORAL_TABLET | Freq: Four times a day (QID) | ORAL | Status: DC | PRN

## 2023-02-04 MED ORDER — POLYETHYLENE GLYCOL 3350 17 G PO PACK: 17.0000 g | PACK | Freq: Every day | ORAL | Status: DC | PRN

## 2023-02-04 MED ORDER — ENOXAPARIN SODIUM 30 MG/0.3ML IJ SOSY: 30.0000 mg | PREFILLED_SYRINGE | INTRAMUSCULAR | Status: DC

## 2023-02-04 MED ORDER — SODIUM CHLORIDE 0.9% FLUSH
3.0000 mL | Freq: Two times a day (BID) | INTRAVENOUS | Status: DC
  Administered 2023-02-04 – 2023-02-06 (×6): 3 mL via INTRAVENOUS

## 2023-02-04 MED ORDER — TAMSULOSIN HCL 0.4 MG PO CAPS
0.4000 mg | ORAL_CAPSULE | Freq: Every day | ORAL | Status: DC
  Administered 2023-02-04 – 2023-02-08 (×4): 0.4 mg via ORAL
  Filled 2023-02-04 (×5): qty 1

## 2023-02-04 MED ORDER — ONDANSETRON HCL 4 MG/2ML IJ SOLN: 4.0000 mg | Freq: Four times a day (QID) | INTRAMUSCULAR | Status: DC | PRN

## 2023-02-04 MED ORDER — ASPIRIN 81 MG PO TBEC
81.0000 mg | DELAYED_RELEASE_TABLET | Freq: Every day | ORAL | Status: DC
  Administered 2023-02-04 – 2023-02-08 (×4): 81 mg via ORAL
  Filled 2023-02-04 (×5): qty 1

## 2023-02-04 MED ORDER — INSULIN ASPART 100 UNIT/ML IJ SOLN
0.0000 [IU] | Freq: Three times a day (TID) | INTRAMUSCULAR | Status: DC
  Administered 2023-02-05: 1 [IU] via SUBCUTANEOUS
  Filled 2023-02-04: qty 1

## 2023-02-04 MED ORDER — HEPARIN SODIUM (PORCINE) 5000 UNIT/ML IJ SOLN
5000.0000 [IU] | Freq: Three times a day (TID) | INTRAMUSCULAR | Status: DC
  Administered 2023-02-04 – 2023-02-08 (×10): 5000 [IU] via SUBCUTANEOUS
  Filled 2023-02-04 (×10): qty 1

## 2023-02-04 MED ORDER — SERTRALINE HCL 50 MG PO TABS
100.0000 mg | ORAL_TABLET | Freq: Every day | ORAL | Status: DC
  Administered 2023-02-04 – 2023-02-08 (×4): 100 mg via ORAL
  Filled 2023-02-04 (×5): qty 2

## 2023-02-04 MED ORDER — ADULT MULTIVITAMIN W/MINERALS CH
1.0000 | ORAL_TABLET | Freq: Every day | ORAL | Status: DC
  Administered 2023-02-04 – 2023-02-08 (×4): 1 via ORAL
  Filled 2023-02-04 (×5): qty 1

## 2023-02-04 MED ORDER — ROSUVASTATIN CALCIUM 10 MG PO TABS
10.0000 mg | ORAL_TABLET | Freq: Every day | ORAL | Status: DC
  Administered 2023-02-04 – 2023-02-08 (×4): 10 mg via ORAL
  Filled 2023-02-04 (×6): qty 1

## 2023-02-04 MED ORDER — ACETAMINOPHEN 325 MG PO TABS: 650.0000 mg | ORAL_TABLET | Freq: Four times a day (QID) | ORAL | Status: DC | PRN

## 2023-02-04 MED ORDER — ACETAMINOPHEN 650 MG RE SUPP: 650.0000 mg | Freq: Four times a day (QID) | RECTAL | Status: DC | PRN

## 2023-02-04 MED ORDER — THIAMINE HCL 100 MG PO TABS
100.0000 mg | ORAL_TABLET | Freq: Every day | ORAL | Status: DC
  Administered 2023-02-05 – 2023-02-08 (×3): 100 mg via ORAL
  Filled 2023-02-04 (×8): qty 1

## 2023-02-04 MED ORDER — PANTOPRAZOLE SODIUM 20 MG PO TBEC
20.0000 mg | DELAYED_RELEASE_TABLET | Freq: Every day | ORAL | Status: DC
  Administered 2023-02-05 – 2023-02-08 (×3): 20 mg via ORAL
  Filled 2023-02-04 (×4): qty 1

## 2023-02-04 NOTE — Assessment & Plan Note (Signed)
Not on any medications at home  - SSI, very sensitive scale

## 2023-02-04 NOTE — Assessment & Plan Note (Signed)
Patient has a history of underlying dementia, however patient sister at bedside states he has been more altered over the last few days.  - Delirium precautions

## 2023-02-04 NOTE — Assessment & Plan Note (Signed)
Patient is presenting with increased generalized weakness, increased disorientation, likely in the setting of underlying infection with COVID-pain.  History of dementia is likely exacerbating symptoms.  - Management of COVID-19 as noted below - PT/OT - Dysphagia diet until improvement in mentation

## 2023-02-04 NOTE — Assessment & Plan Note (Signed)
Patient appears euvolemic on examination today.  He is not on any diuretics at this time.

## 2023-02-04 NOTE — Assessment & Plan Note (Addendum)
-   Hold home carvedilol until MRI results return.  If negative for CVA, will restart

## 2023-02-04 NOTE — Assessment & Plan Note (Signed)
Patient was exposed to COVID-19 approximately 3 to 4 days ago with symptoms beginning yesterday.  No evidence of hypoxia to necessitate use of Decadron.  Given ESRD, unable to use Paxlovid.  - Continuous pulse oximetry - Supportive management with Tylenol, as needed IV fluids

## 2023-02-04 NOTE — H&P (Addendum)
History and Physical    Patient: Randall [name] [id number] DOB: Mar 26, 1961 DOA: 02/04/2023 DOS: the patient was seen and examined on 02/04/2023 PCP: Thayer Headings, MD  Patient coming from: Home  Chief Complaint:  Chief Complaint  Patient presents with   Weakness   HPI: Randall [name] is a 62 y.o. male with medical history significant of dementia, ESRD on HD, HFpEF, hypertension, hyperlipidemia, type 2 diabetes, CVA, Bell's palsy who is presents to the ED due to generalized weakness and altered mental status.  History obtained from patient's sister at bedside with whom the patient lives.  Randall [name] states that last week, several family members were exposed to COVID-19 after someone was sick.  She notes that she started feeling sick shortly thereafter.  Randall [name] was tested on 02/01/2023 with a negative test.  In addition, he was tested at his dialysis center on 3/29 but is unsure about those results.  Last night, she noticed that he seemed weak all over in addition to more confused than usual.  She also noted a cough that seemed new.  Patient was endorsing dizziness but denied any shortness of breath.  At this time, patient denies any complaints.  Then this morning when she was feeding him breakfast, she saw that he seemed to be drooling out of the right side of his face and that the right side seemed more droopy.  She denies any other focal neurological deficits.  ED course: On arrival to the ED, patient was hypertensive at 150/69 with heart rate of 60.  He was saturating at 100% on room air.  He was afebrile at 98.4. Initial workup notable for COVID-19 PCR positive, WBC of 5.1, hemoglobin 10.9, sodium 134, chloride 95, glucose 111, BUN 29, creatinine 3.94 with GFR 17.  Troponin elevated at 45 with flat trend to 45.  Chest x-ray was obtained that did not show any acute cardiopulmonary disease.  TRH contacted for admission.  Review of Systems: As mentioned in the history of present  illness. All other systems reviewed and are negative.  Past Medical History:  Diagnosis Date   Anemia    Anxiety    Arthritis    CHF (congestive heart failure) (HCC)    Chronic kidney disease    Dementia ([place])    Depression    Diabetes mellitus without complication (HCC)    GERD (gastroesophageal reflux disease)    Heart murmur    Hypertension    Hypertensive urgency 12/09/2021   Pneumonia    Stroke [facility])    Past Surgical History:  Procedure Laterality Date   AV FISTULA PLACEMENT Right 06/15/2022   Procedure: ARTERIOVENOUS (AV) FISTULA CREATION (BRACHIALCEPHALIC);  Surgeon: Algernon Huxley, MD;  Location: [facility] ORS;  Service: Vascular;  Laterality: Right;   COLONOSCOPY W/ POLYPECTOMY     dental work     DIALYSIS/PERMA CATHETER INSERTION N/A 12/30/2021   Procedure: DIALYSIS/PERMA CATHETER INSERTION;  Surgeon: Algernon Huxley, MD;  Location: [place] CV LAB;  Service: Cardiovascular;  Laterality: N/A;   DIALYSIS/PERMA CATHETER REMOVAL N/A 10/19/2022   Procedure: DIALYSIS/PERMA CATHETER REMOVAL;  Surgeon: Algernon Huxley, MD;  Location: [place] CV LAB;  Service: Cardiovascular;  Laterality: N/A;   ESOPHAGOGASTRODUODENOSCOPY (EGD) WITH PROPOFOL N/A 12/26/2021   Procedure: ESOPHAGOGASTRODUODENOSCOPY (EGD) WITH PROPOFOL;  Surgeon: Annamaria Helling, DO;  Location: [facility] ENDOSCOPY;  Service: Gastroenterology;  Laterality: N/A;   TEMPORARY DIALYSIS CATHETER N/A 12/27/2021   Procedure: TEMPORARY DIALYSIS CATHETER;  Surgeon: Katha Cabal, MD;  Location:  [place] CV LAB;  Service: Cardiovascular;  Laterality: N/A;   TONSILLECTOMY     adeniods removed   Social History:  reports that he has quit smoking. His smoking use included cigars and cigarettes. He smoked an average of .25 packs per day. He does not have any smokeless tobacco history on file. He reports current alcohol use of about 1.0 standard drink of alcohol per week. He reports that he does not use drugs.  Allergies   Allergen Reactions   Penicillins     Reaction in childhood     Family History  Problem Relation Age of Onset   Hypertension Mother    Dementia Mother    Hypertension Maternal Grandmother    Diabetes Maternal Grandmother    Stroke Maternal Grandmother    Heart attack Maternal Grandmother    Stroke Maternal Grandfather     Prior to Admission medications   Medication Sig Start Date End Date Taking? Authorizing Provider  aspirin 81 MG EC tablet Take 81 mg by mouth daily. 09/04/21   [provider]  carvedilol (COREG) 25 MG tablet Take by mouth. 03/02/22 08/29/22  [provider]  cholecalciferol (VITAMIN D3) 25 MCG (1000 UNIT) tablet Take 1,000 Units by mouth daily.    [provider]  cyanocobalamin 1000 MCG tablet Take 1,000 mcg by mouth daily.    [provider]  pantoprazole (PROTONIX) 40 MG tablet Take 1 tablet by mouth daily. 04/13/22   [provider]  rosuvastatin (CRESTOR) 10 MG tablet Take 10 mg by mouth daily.    [provider]  sacubitril-valsartan (ENTRESTO) 97-103 MG Take 1 tablet by mouth 2 (two) times daily. Patient not taking: Reported on 08/08/2022    [provider]  sertraline (ZOLOFT) 100 MG tablet Take 100 mg by mouth daily. 08/19/21   [provider]  thiamine (VITAMIN B1) 100 MG tablet Take 1 tablet by mouth daily. 07/27/22 07/27/23  [provider]    Physical Exam: Vitals:   02/04/23 1330 02/04/23 1345 02/04/23 1430 02/04/23 1600  BP: (!) 163/59 (!) 163/59 (!) 165/63 (!) 176/66  Pulse:  (!) 57 60 79  Resp: 15 14 16  (!) 23  Temp:      TempSrc:      SpO2:  100% 100% 100%  Weight:      Height:       Physical Exam Vitals and nursing note reviewed.  Constitutional:      Appearance: He is obese. He is not ill-appearing.  HENT:     Head: Normocephalic and atraumatic.     Mouth/Throat:     Mouth: Mucous membranes are moist.     Pharynx: Oropharynx is clear.  Eyes:      Conjunctiva/sclera: Conjunctivae normal.     Pupils: Pupils are equal, round, and reactive to light.  Cardiovascular:     Rate and Rhythm: Normal rate and regular rhythm.     Heart sounds: No murmur heard.    No gallop.  Pulmonary:     Effort: Pulmonary effort is normal. No respiratory distress.     Breath sounds: Normal breath sounds. No wheezing, rhonchi or rales.  Abdominal:     General: Bowel sounds are normal.     Palpations: Abdomen is soft.  Musculoskeletal:     Right lower leg: No edema.     Left lower leg: No edema.  Skin:    General: Skin is warm and dry.  Neurological:     Mental Status: He is  alert.     Comments:  Patient is alert and oriented to person and place only. No dysarthria No facial droop at rest, however when asked to smile, left side of face with significantly less movement No visual field deficit Sensation intact throughout 5 out of 5 strength of bilateral upper extremity 5 out of 5 strength of left lower extremity 4 out of 5 strength right lower extremity  Psychiatric:        Behavior: Behavior is withdrawn.        Cognition and Memory: Cognition is impaired. Memory is impaired.    Data Reviewed: CBC with WBC of 5.1, hemoglobin of 10.9, platelets of 203 BMP with sodium of 134, potassium 3.8, chloride 95, bicarb 28, glucose 111, BUN 29, creatinine 3.94, anion gap 11 and GFR 17 Initial troponin elevated at 45 with flat trend to 45 Lipid panel with LDL of 86  EKG personally reviewed.  Sinus rhythm with rate of 61.  J-point elevation in lateral leads but otherwise no changes concerning for acute ischemia  DG Chest Port 1 View  Result Date: 02/04/2023 CLINICAL DATA:  Cough EXAM: PORTABLE CHEST 1 VIEW COMPARISON:  CXR 03/17/23 FINDINGS: No pleural effusion. No pneumothorax. Prominent cardiac contours, unchanged from prior. Unchanged mediastinal contours. No focal airspace opacity. No radiographically apparent displaced rib fractures. Visualized upper  abdomen is unremarkable. Degenerative changes of the bilateral AC joints. IMPRESSION: No focal airspace opacity.  No pulmonary edema.  No pleural effusion Electronically Signed   By: Marin Roberts M.D.   On: 02/04/2023 13:34    There are no new results to review at this time.  Assessment and Plan:  * Acute encephalopathy Patient is presenting with increased generalized weakness, increased disorientation, likely in the setting of underlying infection with COVID-pain.  History of dementia is likely exacerbating symptoms.  - Management of COVID-19 as noted below - PT/OT - Dysphagia diet until improvement in mentation  COVID-19 virus infection Patient was exposed to COVID-19 approximately 3 to 4 days ago with symptoms beginning yesterday.  No evidence of hypoxia to necessitate use of Decadron.  Given ESRD, unable to use Paxlovid.  - Continuous pulse oximetry - Supportive management with Tylenol, as needed IV fluids  Weakness on left side of face On examination, patient's left side of the face notably less than the right.  He has a history of Bell's palsy on the right however.  He also has a history of CVA with residual right-sided weakness that is present today as well.  Given history of CVA with multiple risk factors, will obtain MRI of the brain.  - MRI brain without contrast - Pending MRI results, will consider neurology consultation - Continue home aspirin and statin - Lipid panel pending  ESRD on dialysis [facility]) - Nephrology consulted; appreciate their recommendations - Dialysis scheduling per nephrology  Essential hypertension - Hold home carvedilol until MRI results return.  If negative for CVA, will restart  Chronic systolic CHF (congestive heart failure) ([place]) Patient appears euvolemic on examination today.  He is not on any diuretics at this time.  Dementia [facility]) Patient has a history of underlying dementia, however patient sister at bedside states he has been more altered  over the last few days.  - Delirium precautions  Advance Care Planning:   Code Status: DNR.  Per patient's MOST form on the chart, patient would not want CPR, however he would want full respiratory resuscitation.  Patient's sister at bedside states she would like to honor the  MOST form.  Consults: None  Family Communication: Patient sister updated bedside  Severity of Illness: The appropriate patient status for this patient is OBSERVATION. Observation status is judged to be reasonable and necessary in order to provide the required intensity of service to ensure the patient's safety. The patient's presenting symptoms, physical exam findings, and initial radiographic and laboratory data in the context of their medical condition is felt to place them at decreased risk for further clinical deterioration. Furthermore, it is anticipated that the patient will be medically stable for discharge from the hospital within 2 midnights of admission.   Author: Jose Persia, MD 02/04/2023 4:21 PM  For on call review [email or web address].

## 2023-02-04 NOTE — ED Provider Notes (Signed)
[facility] Provider Note    Event Date/Time   First MD Initiated Contact with Patient 02/04/23 1248     (approximate)   History   Weakness   HPI  Randall [name] is a 62 y.o. male with a history of hypertension, CHF, ESRD on dialysis M/W/F, GERD, and CVA who presents with generalized weakness and confusion over the last couple of days.  EMS reported that there is COVID in the patient's household.  The patient states he feels lightheaded and weak.  He also reports an increased cough.  He denies feeling short of breath and has no acute pain.  I reviewed the past medical records.  The patient was seen in the ED on 3/11 with generalized weakness.  Lab workup was unremarkable at that time.  He was most recently admitted a year ago due to intractable nausea and vomiting and AKI, which was the beginning of his ESRD.   Physical Exam   Triage Vital Signs: ED Triage Vitals [02/04/23 1246]  Enc Vitals Group     BP      Pulse      Resp      Temp      Temp src      SpO2 100 %     Weight      Height      Head Circumference      Peak Flow      Pain Score      Pain Loc      Pain Edu?      Excl. in [employer]?     Most recent vital signs: Vitals:   02/04/23 1345 02/04/23 1430  BP: (!) 163/59 (!) 165/63  Pulse: (!) 57 60  Resp: 14 16  Temp:    SpO2: 100% 100%     [name]: Alert, oriented x 2, no distress.  CV:  Good peripheral perfusion.  Resp:  Normal effort.  Lungs CTAB. Abd:  No distention.  Other:  Moist mucous membranes.  Motor intact in all extremities.  No significant peripheral edema.   ED Results / Procedures / Treatments   Labs (all labs ordered are listed, but only abnormal results are displayed) Labs Reviewed  RESP PANEL BY RT-PCR (RSV, FLU A&B, COVID)  [id number] - Abnormal; Notable for the following components:      Result Value   SARS Coronavirus 2 by RT PCR POSITIVE (*)    All other components within normal limits  BASIC METABOLIC  PANEL - Abnormal; Notable for the following components:   Sodium 134 (*)    Chloride 95 (*)    Glucose, Bld 111 (*)    BUN 29 (*)    Creatinine, Ser 3.94 (*)    Calcium 8.6 (*)    GFR, Estimated 17 (*)    All other components within normal limits  CBC WITH DIFFERENTIAL/PLATELET - Abnormal; Notable for the following components:   RBC 3.44 (*)    Hemoglobin 10.9 (*)    HCT 32.8 (*)    All other components within normal limits  TROPONIN I (HIGH SENSITIVITY) - Abnormal; Notable for the following components:   Troponin I (High Sensitivity) 45 (*)    All other components within normal limits  TROPONIN I (HIGH SENSITIVITY)     EKG  ED ECG REPORT I, Arta Silence, the attending physician, personally viewed and interpreted this ECG.  Date: 02/04/2023 EKG Time: 1250 Rate: 61 Rhythm: normal sinus rhythm QRS Axis: normal Intervals: normal ST/T Wave  abnormalities: normal Narrative Interpretation: no evidence of acute ischemia   RADIOLOGY  Chest x-ray: I independently viewed and interpreted the images; there is no focal consolidation or edema  PROCEDURES:  Critical Care performed: No  Procedures   MEDICATIONS ORDERED IN ED: Medications - No data to display   IMPRESSION / MDM / [place] / ED COURSE  I reviewed the triage vital signs and the nursing notes.  62 year old male with PMH as noted above presents with generalized weakness and possible increased confusion for the last couple of days; the patient also endorses a cough.  Differential diagnosis includes, but is not limited to, COVID-19, other viral syndrome, acute bronchitis, pneumonia, other infection, dehydration, electrolyte abnormality, other metabolic disturbance, less likely primary cardiac etiology.  We will obtain a respiratory panel, basic labs, cardiac enzymes, and reassess.  Patient's presentation is most consistent with acute presentation with potential threat to life or bodily  function.  The patient is on the cardiac monitor to evaluate for evidence of arrhythmia and/or significant heart rate changes.  ----------------------------------------- 3:34 PM on 02/04/2023 -----------------------------------------  Respiratory panel is positive for COVID.  The troponin is slightly elevated although this is not unexpected in ESRD.  Electrolytes are unremarkable at this time with a normal potassium.  The COVID is the likely etiology of the patient's symptoms.  On further discussion with his sister who is here now and who lives with him, the patient has been increasingly weak over the last couple days and today was unable to walk on his own or complete ADLs even with her assistance.  Due to this acute generalized weakness I will admit him for further management.  I consulted Dr. Charleen Kirks from the hospitalist service; based our discussion she agrees to admit the patient.   FINAL CLINICAL IMPRESSION(S) / ED DIAGNOSES   Final diagnoses:  Generalized weakness  COVID-19     Rx / DC Orders   ED Discharge Orders     None        Note:  This document was prepared using Dragon voice recognition software and may include unintentional dictation errors.    Arta Silence, MD 02/04/23 1535

## 2023-02-04 NOTE — ED Triage Notes (Signed)
Pt presents to the ED with [facility] from home. Pt has had weakness that pt states started last night. Pt does have hx of dementia and is Alert and oriented to person. Disoriented to situation, place and time. There is covid in the household. Pt tested negative for covid on Friday. Pt is a dialysis patient and last treatment was Friday. Wife states that he has been acting different since Friday. Pt does have a hx of a stroke with right sided deficits per EMS.

## 2023-02-04 NOTE — Assessment & Plan Note (Signed)
-   Nephrology consulted; appreciate their recommendations - Dialysis scheduling per nephrology

## 2023-02-04 NOTE — Assessment & Plan Note (Signed)
On examination, patient's left side of the face notably less than the right.  He has a history of Bell's palsy on the right however.  He also has a history of CVA with residual right-sided weakness that is present today as well.  Given history of CVA with multiple risk factors, will obtain MRI of the brain.  - MRI brain without contrast - Pending MRI results, will consider neurology consultation - Continue home aspirin and statin - Lipid panel pending

## 2023-02-04 NOTE — Progress Notes (Signed)
PHARMACIST - PHYSICIAN COMMUNICATION  CONCERNING:  Enoxaparin (Lovenox) for DVT Prophylaxis    RECOMMENDATION: Patient was prescribed enoxaprin 40mg  q24 hours for VTE prophylaxis.   Filed Weights   02/04/23 1249  Weight: 84 kg (185 lb 3 oz)    Body mass index is 29 kg/m.  Estimated Creatinine Clearance: 20.4 mL/min (A) (by C-G formula based on SCr of 3.94 mg/dL (H)).   Based on [place] patient is candidate for enoxaparin 0.5mg /kg TBW SQ every 24 hours based on BMI being >30.  Patient is candidate for enoxaparin 30mg  every 24 hours based on CrCl <31ml/min  DESCRIPTION: Pharmacy has adjusted enoxaparin dose per [facility] policy.  Patient is now receiving enoxaparin 30 mg every 24 hours    Bliss Behnke Rodriguez-Guzman PharmD, BCPS 02/04/2023 3:45 PM

## 2023-02-05 DIAGNOSIS — G9340 Encephalopathy, unspecified: Secondary | ICD-10-CM | POA: Diagnosis not present

## 2023-02-05 LAB — GLUCOSE, CAPILLARY
Glucose-Capillary: 152 mg/dL — ABNORMAL HIGH (ref 70–99)
Glucose-Capillary: 87 mg/dL (ref 70–99)
Glucose-Capillary: 92 mg/dL (ref 70–99)

## 2023-02-05 LAB — CBC
HCT: 33.2 % — ABNORMAL LOW (ref 39.0–52.0)
Hemoglobin: 10.9 g/dL — ABNORMAL LOW (ref 13.0–17.0)
MCH: 30.7 pg (ref 26.0–34.0)
MCHC: 32.8 g/dL (ref 30.0–36.0)
MCV: 93.5 fL (ref 80.0–100.0)
Platelets: 200 10*3/uL (ref 150–400)
RBC: 3.55 MIL/uL — ABNORMAL LOW (ref 4.22–5.81)
RDW: 13 % (ref 11.5–15.5)
WBC: 6.4 10*3/uL (ref 4.0–10.5)
nRBC: 0 % (ref 0.0–0.2)

## 2023-02-05 LAB — COMPREHENSIVE METABOLIC PANEL
ALT: 13 U/L (ref 0–44)
AST: 20 U/L (ref 15–41)
Albumin: 3.7 g/dL (ref 3.5–5.0)
Alkaline Phosphatase: 77 U/L (ref 38–126)
Anion gap: 10 (ref 5–15)
BUN: 37 mg/dL — ABNORMAL HIGH (ref 8–23)
CO2: 26 mmol/L (ref 22–32)
Calcium: 8.8 mg/dL — ABNORMAL LOW (ref 8.9–10.3)
Chloride: 98 mmol/L (ref 98–111)
Creatinine, Ser: 4.08 mg/dL — ABNORMAL HIGH (ref 0.61–1.24)
GFR, Estimated: 16 mL/min — ABNORMAL LOW (ref >60)
Glucose, Bld: 85 mg/dL (ref 70–99)
Potassium: 3.6 mmol/L (ref 3.5–5.1)
Sodium: 134 mmol/L — ABNORMAL LOW (ref 135–145)
Total Bilirubin: 0.8 mg/dL (ref 0.3–1.2)
Total Protein: 7.5 g/dL (ref 6.5–8.1)

## 2023-02-05 LAB — C-REACTIVE PROTEIN: CRP: 2 mg/dL — ABNORMAL HIGH (ref <1.0)

## 2023-02-05 LAB — HIV ANTIBODY (ROUTINE TESTING W REFLEX): HIV Screen 4th Generation wRfx: NONREACTIVE

## 2023-02-05 MED ORDER — CHLORHEXIDINE GLUCONATE CLOTH 2 % EX PADS
6.0000 | MEDICATED_PAD | Freq: Every day | CUTANEOUS | Status: DC
  Administered 2023-02-05 – 2023-02-08 (×4): 6 via TOPICAL

## 2023-02-05 MED ORDER — HEPARIN SODIUM (PORCINE) 1000 UNIT/ML IJ SOLN: 3000.0000 [IU] | Freq: Once | INTRAMUSCULAR | Status: DC

## 2023-02-05 MED ORDER — HEPARIN SODIUM (PORCINE) 1000 UNIT/ML DIALYSIS: 1000.0000 [IU] | INTRAMUSCULAR | Status: DC | PRN

## 2023-02-05 MED ORDER — ANTICOAGULANT SODIUM CITRATE 4% (200MG/5ML) IV SOLN: 5.0000 mL | Status: DC | PRN

## 2023-02-05 MED ORDER — PENTAFLUOROPROP-TETRAFLUOROETH EX AERO: 1.0000 | INHALATION_SPRAY | CUTANEOUS | Status: DC | PRN

## 2023-02-05 MED ORDER — CARVEDILOL 25 MG PO TABS
25.0000 mg | ORAL_TABLET | Freq: Two times a day (BID) | ORAL | Status: DC
  Administered 2023-02-05 – 2023-02-08 (×4): 25 mg via ORAL
  Filled 2023-02-05 (×4): qty 1

## 2023-02-05 MED ORDER — LIDOCAINE HCL (PF) 1 % IJ SOLN: 5.0000 mL | INTRAMUSCULAR | Status: DC | PRN

## 2023-02-05 MED ORDER — ALTEPLASE 2 MG IJ SOLR: 2.0000 mg | Freq: Once | INTRAMUSCULAR | Status: DC | PRN

## 2023-02-05 MED ORDER — LIDOCAINE-PRILOCAINE 2.5-2.5 % EX CREA: 1.0000 | TOPICAL_CREAM | CUTANEOUS | Status: DC | PRN

## 2023-02-05 NOTE — Progress Notes (Signed)
[facility]  ROUNDING NOTE   Subjective:   Randall [name] is a 62 year old male with past medical conditions including hypertension, type 2 diabetes, Bell's palsy, CVA, CHF, hyperlipidemia, and end-stage renal disease on hemodialysis.  Patient presents to the emergency department complaining of weakness and confusion.  Patient has been admitted under observation for Acute encephalopathy [G93.40] Generalized weakness [R53.1] COVID-19 [U07.1]  Patient is known to our practice and receives outpatient dialysis treatments at [facility] on a MWF schedule, supervised by Dr. Holley Raring.  Last dialysis treatment received on Friday.  Patient is seen sitting up in chair, barely touched breakfast tray at bedside.  Patient is alert but very slow to respond to simple questioning.  Chart review states patient's wife reported different behavior after dialysis on Friday.  Multiple family members have recently tested positive for COVID-19.  Patient also has history of stroke.  Continues to complain of weakness.  Denies pain or discomfort.  Denies chest or abdominal pain.  Denies shortness of breath, remains on room air.  No lower extremity edema.  Labs on ED arrival include sodium 134, glucose 111, BUN 29, creatinine 3.94 with calcium 8.6.  Hemoglobin 10.9.  Respiratory clinical was positive for COVID-19.  Chest x-ray negative for pulmonary edema or pleural effusion.  Brain MRI negative for any acute findings, age-related findings and previous strokes only.  We have been consulted to manage dialysis needs during this admission.   Objective:  Vital signs in last 24 hours:  Temp:  [98.3 F (36.8 C)-98.7 F (37.1 C)] 98.7 F (37.1 C) (04/01 1200) Pulse Rate:  [66-85] 85 (04/01 1200) Resp:  [16-23] 22 (04/01 1200) BP: (130-188)/(66-105) 148/75 (04/01 1200) SpO2:  [98 %-100 %] 100 % (04/01 1200)  Weight change:  Filed Weights   02/04/23 1249  Weight: 84 kg    Intake/Output: I/O last 3  completed shifts: In: -  Out: 200 [Urine:200]   Intake/Output this shift:  No intake/output data recorded.  Physical Exam: General: NAD, sitting in chair, withdrawn  Head: Normocephalic, atraumatic. Moist oral mucosal membranes  Eyes: Anicteric  Lungs:  Clear to auscultation, normal effort  Heart: Regular rate and rhythm  Abdomen:  Soft, nontender  Extremities: No peripheral edema.  Neurologic: Alert with delayed response, moving all four extremities  Skin: No lesions  Access: Right aVF    Basic Metabolic Panel: Recent Labs  Lab 02/04/23 1301 02/05/23 0348  NA 134* 134*  K 3.8 3.6  CL 95* 98  CO2 28 26  GLUCOSE 111* 85  BUN 29* 37*  CREATININE 3.94* 4.08*  CALCIUM 8.6* 8.8*    Liver Function Tests: Recent Labs  Lab 02/05/23 0348  AST 20  ALT 13  ALKPHOS 77  BILITOT 0.8  PROT 7.5  ALBUMIN 3.7   No results for input(s): "LIPASE", "AMYLASE" in the last 168 hours. No results for input(s): "AMMONIA" in the last 168 hours.  CBC: Recent Labs  Lab 02/04/23 1301 02/05/23 0348  WBC 5.1 6.4  NEUTROABS 3.1  --   HGB 10.9* 10.9*  HCT 32.8* 33.2*  MCV 95.3 93.5  PLT 203 200    Cardiac Enzymes: No results for input(s): "CKTOTAL", "CKMB", "CKMBINDEX", "TROPONINI" in the last 168 hours.  BNP: Invalid input(s): "POCBNP"  CBG: Recent Labs  Lab 02/04/23 1728 02/04/23 2217 02/05/23 1211  GLUCAP 76 58 152*    Microbiology: Results for orders placed or performed during the hospital encounter of 02/04/23  Resp panel by RT-PCR (RSV, Flu A&B,  Covid) Anterior Nasal Swab     Status: Abnormal   Collection Time: 02/04/23 12:57 PM   Specimen: Anterior Nasal Swab  Result Value Ref Range Status   SARS Coronavirus 2 by RT PCR POSITIVE (A) NEGATIVE Final    Comment: (NOTE) SARS-CoV-2 target nucleic acids are DETECTED.  The SARS-CoV-2 RNA is generally detectable in upper respiratory specimens during the acute phase of infection. Positive results are indicative of  the presence of the identified virus, but do not rule out bacterial infection or co-infection with other pathogens not detected by the test. Clinical correlation with patient history and other diagnostic information is necessary to determine patient infection status. The expected result is Negative.  Fact Sheet for Patients: EntrepreneurPulse.com.au  Fact Sheet for Healthcare Providers: IncredibleEmployment.be  This test is not yet approved or cleared by the [place] FDA and  has been authorized for detection and/or diagnosis of SARS-CoV-2 by FDA under an Emergency Use Authorization (EUA).  This EUA will remain in effect (meaning this test can be used) for the duration of  the COVID-19 declaration under Section 564(b)(1) of the A ct, 21 U.S.C. section 360bbb-3(b)(1), unless the authorization is terminated or revoked sooner.     Influenza A by PCR NEGATIVE NEGATIVE Final   Influenza B by PCR NEGATIVE NEGATIVE Final    Comment: (NOTE) The Xpert Xpress SARS-CoV-2/FLU/RSV plus assay is intended as an aid in the diagnosis of influenza from Nasopharyngeal swab specimens and should not be used as a sole basis for treatment. Nasal washings and aspirates are unacceptable for Xpert Xpress SARS-CoV-2/FLU/RSV testing.  Fact Sheet for Patients: EntrepreneurPulse.com.au  Fact Sheet for Healthcare Providers: IncredibleEmployment.be  This test is not yet approved or cleared by the [place] FDA and has been authorized for detection and/or diagnosis of SARS-CoV-2 by FDA under an Emergency Use Authorization (EUA). This EUA will remain in effect (meaning this test can be used) for the duration of the COVID-19 declaration under Section 564(b)(1) of the Act, 21 U.S.C. section 360bbb-3(b)(1), unless the authorization is terminated or revoked.     Resp Syncytial Virus by PCR NEGATIVE NEGATIVE Final    Comment:  (NOTE) Fact Sheet for Patients: EntrepreneurPulse.com.au  Fact Sheet for Healthcare Providers: IncredibleEmployment.be  This test is not yet approved or cleared by the [place] FDA and has been authorized for detection and/or diagnosis of SARS-CoV-2 by FDA under an Emergency Use Authorization (EUA). This EUA will remain in effect (meaning this test can be used) for the duration of the COVID-19 declaration under Section 564(b)(1) of the Act, 21 U.S.C. section 360bbb-3(b)(1), unless the authorization is terminated or revoked.  Performed at [facility], [address] [place], [place] [address]     Coagulation Studies: No results for input(s): "LABPROT", "INR" in the last 72 hours.  Urinalysis: No results for input(s): "COLORURINE", "LABSPEC", "PHURINE", "GLUCOSEU", "HGBUR", "BILIRUBINUR", "KETONESUR", "PROTEINUR", "UROBILINOGEN", "NITRITE", "LEUKOCYTESUR" in the last 72 hours.  Invalid input(s): "APPERANCEUR"    Imaging: MR BRAIN WO CONTRAST  Result Date: 02/04/2023 CLINICAL DATA:  Neuro deficit, acute, stroke suspected. Weakness. Altered mental status. EXAM: MRI HEAD WITHOUT CONTRAST TECHNIQUE: Multiplanar, multiecho pulse sequences of the brain and surrounding structures were obtained without intravenous contrast. COMPARISON:  01/15/2023 FINDINGS: Brain: Diffusion imaging does not show any acute or subacute infarction or other cause of restricted diffusion. Extensive chronic small-vessel ischemic changes are seen throughout the pons. Old lacunar infarction in the right pons. Few old small vessel cerebellar infarctions. Old small vessel infarctions  of the thalami. Advanced and confluent chronic small vessel ischemic changes throughout the cerebral hemispheric white matter. No cortical or large vessel territory infarction. No mass lesion, hydrocephalus or extra-axial collection. Hemosiderin deposition throughout the brain associated  with many of the old small vessel insults. Findings suggest chronic hypertensive encephalopathy. More extensive area of hemosiderin deposition in the region of the white matter posterior to the atrium of the right lateral ventricle. No evidence of acute hemorrhage. Vascular: Major vessels at the base of the brain show flow. Skull and upper cervical spine: Negative Sinuses/Orbits: Clear/normal Other: None IMPRESSION: No acute finding. Extensive chronic small-vessel ischemic changes throughout the brain as outlined above. Hemosiderin deposition throughout the brain in association with the old infarctions consistent with chronic hypertensive encephalopathy. Electronically Signed   By: Nelson Chimes M.D.   On: 02/04/2023 20:10   DG Chest Port 1 View  Result Date: 02/04/2023 CLINICAL DATA:  Cough EXAM: PORTABLE CHEST 1 VIEW COMPARISON:  CXR 03/17/23 FINDINGS: No pleural effusion. No pneumothorax. Prominent cardiac contours, unchanged from prior. Unchanged mediastinal contours. No focal airspace opacity. No radiographically apparent displaced rib fractures. Visualized upper abdomen is unremarkable. Degenerative changes of the bilateral AC joints. IMPRESSION: No focal airspace opacity.  No pulmonary edema.  No pleural effusion Electronically Signed   By: Marin Roberts M.D.   On: 02/04/2023 13:34     Medications:    anticoagulant sodium citrate      aspirin EC  81 mg Oral Daily   carvedilol  25 mg Oral BID WC   Chlorhexidine Gluconate Cloth  6 each Topical Q0600   heparin  5,000 Units Subcutaneous Q8H   insulin aspart  0-6 Units Subcutaneous TID WC   multivitamin with minerals  1 tablet Oral Daily   pantoprazole  20 mg Oral Daily   rosuvastatin  10 mg Oral Daily   sertraline  100 mg Oral Daily   sodium chloride flush  3 mL Intravenous Q12H   tamsulosin  0.4 mg Oral Daily   thiamine  100 mg Oral Daily   acetaminophen **OR** acetaminophen, alteplase, anticoagulant sodium citrate, heparin, lidocaine (PF),  lidocaine-prilocaine, ondansetron **OR** ondansetron (ZOFRAN) IV, pentafluoroprop-tetrafluoroeth, polyethylene glycol  Assessment/ Plan:  Randall [name] is a 62 y.o.  male with past medical conditions including hypertension, type 2 diabetes, Bell's palsy, CVA, CHF, hyperlipidemia, and end-stage renal disease on hemodialysis.  Patient presents to the emergency department complaining of weakness and confusion.  Patient has been admitted under observation for Acute encephalopathy [G93.40] Generalized weakness [R53.1] COVID-19 [U07.1]  [facility]/MWF/right aVF  Hyponatremia with end-stage renal disease on hemodialysis.  Sodium 134 on ED arrival.  No overt intervention needed at this time.  Patient will receive dialysis later today.  Next treatment scheduled for Wednesday.  2. Anemia of chronic kidney disease Lab Results  Component Value Date   HGB 10.9 (L) 02/05/2023    Hemoglobin within desired range.  Patient receives Mircera at outpatient clinic.  No need for ESA's at this time.  3. Secondary Hyperparathyroidism: with outpatient labs: None Lab Results  Component Value Date   CALCIUM 8.8 (L) 02/05/2023   CAION 1.19 06/15/2022   PHOS 3.9 01/17/2022    Calcium within desired range.  Will monitor bone minerals during this admission.  Currently prescribed cholecalciferol outpatient.  4.  Hypertension with chronic kidney disease.  Home regimen includes carvedilol and Entresto.  Entresto currently held.  Blood pressure currently stable for this patient.    LOS: 0    4/1/20242:50 PM

## 2023-02-05 NOTE — Progress Notes (Signed)
1945: Machine is alarming again for high TMP and prompted to end tx. Access is flushing and pulling well. No clots on cartridge. Dr. Juleen China notified and tx ended.

## 2023-02-05 NOTE — Evaluation (Signed)
Physical Therapy Evaluation Patient Details Name: Randall [name] MRN: [id number] DOB: January 29, 1961 Today's Date: 02/05/2023  History of Present Illness  Pt is a 62 y/o M admitted on 02/04/23 after presenting to the ED with c/o generalized weakness & AMS. Pt is being treated for acute encephalopathy 2/2 COVID-19. PMH: dementia, ESRD on HD, HFpEF, HTN, HLD, DM2, CVA, bell's palsy, anxiety, arthritis, CHF, heart murmur, stroke  Clinical Impression  Pt seen for PT evaluation with pt agreeable. Spoke with pt's sister via telephone who provides home set up info & PLOF. Prior to admission pt was ambulatory without AD but had 1 fall 3 weeks ago. On this date, pt is able to complete bed mobility & STS with min assist, & ambulate in room with HHA fade to no UE support with min assist. PT assisted pt with meal tray set up & pt left in recliner with all needs in reach. Pt would benefit from continued skilled PT treatment to address balance, endurance, strengthening, gait & safety with mobility.   Recommendations for follow up therapy are one component of a multi-disciplinary discharge planning process, led by the attending physician.  Recommendations may be updated based on patient status, additional functional criteria and insurance authorization.  Follow Up Recommendations       Assistance Recommended at Discharge Frequent or constant Supervision/Assistance  Patient can return home with the following  A little help with walking and/or transfers;A little help with bathing/dressing/bathroom;Assistance with cooking/housework;Assist for transportation;Help with stairs or ramp for entrance    Equipment Recommendations None recommended by PT (TBD)  Recommendations for Other Services       Functional Status Assessment Patient has had a recent decline in their functional status and demonstrates the ability to make significant improvements in function in a reasonable and predictable amount of time.      Precautions / Restrictions Precautions Precautions: Fall Restrictions Weight Bearing Restrictions: No      Mobility  Bed Mobility Overal bed mobility: Needs Assistance Bed Mobility: Supine to Sit     Supine to sit: Min assist          Transfers Overall transfer level: Needs assistance Equipment used: None Transfers: Sit to/from Stand Sit to Stand: Min guard           General transfer comment: STS from EOB & recliner with min assist    Ambulation/Gait Ambulation/Gait assistance: Min assist Gait Distance (Feet): 45 Feet (+ 20 ft) Assistive device: 1 person hand held assist, Lofstrands Gait Pattern/deviations: Decreased step length - right, Decreased step length - left, Decreased stride length, Decreased dorsiflexion - right, Decreased dorsiflexion - left Gait velocity: decreased     General Gait Details: Pt ambulates to door & back x 2, then again 1 more time.  Stairs            Wheelchair Mobility    Modified Rankin (Stroke Patients Only)       Balance Overall balance assessment: Needs assistance Sitting-balance support: Feet supported Sitting balance-Leahy Scale: Good     Standing balance support: No upper extremity supported, During functional activity, Single extremity supported Standing balance-Leahy Scale: Fair                               Pertinent Vitals/Pain Pain Assessment Pain Assessment: No/denies pain    Home Living Family/patient expects to be discharged to:: Private residence Living Arrangements: Other relatives (sister) Available Help at Discharge: Family;Available 24 hours/day  Type of Home: House Home Access: Stairs to enter Entrance Stairs-Rails: None Entrance Stairs-Number of Steps: 4-5   Home Layout: One level Home Equipment: None;Tub bench;Hand held shower head      Prior Function Prior Level of Function : Needs assist             Mobility Comments: Pt ambulating without AD, 1 fall ~3 weeks  ago. ADLs Comments: Sister helps pt with bathing & dressing     Hand Dominance        Extremity/Trunk Assessment   Upper Extremity Assessment Upper Extremity Assessment: Generalized weakness    Lower Extremity Assessment Lower Extremity Assessment: Generalized weakness       Communication   Communication:  (very soft spoken)  Cognition Arousal/Alertness: Awake/alert Behavior During Therapy: Flat affect Overall Cognitive Status: History of cognitive impairments - at baseline                                 General Comments: Pt with hx of dementia, oriented to his name & place ("hospital"), follows simple commands throughout session.        General Comments General comments (skin integrity, edema, etc.): Pt with difficulty swallowing last pill still in mouth - required several sips of drink & applesauce - nurse notified. PT assisted with meal tray set up.    Exercises     Assessment/Plan    PT Assessment Patient needs continued PT services  PT Problem List Decreased strength;Cardiopulmonary status limiting activity;Decreased range of motion;Decreased activity tolerance;Decreased balance;Decreased safety awareness;Decreased mobility;Decreased knowledge of use of DME;Decreased cognition;Decreased coordination       PT Treatment Interventions DME instruction;Therapeutic exercise;Balance training;Stair training;Neuromuscular re-education;Gait training;Functional mobility training;Therapeutic activities;Patient/family education    PT Goals (Current goals can be found in the Care Plan section)  Acute Rehab PT Goals Patient Stated Goal: get better PT Goal Formulation: With patient/family Time For Goal Achievement: 02/19/23 Potential to Achieve Goals: Good    Frequency Min 2X/week     Co-evaluation               AM-PAC PT "6 Clicks" Mobility  Outcome Measure Help needed turning from your back to your side while in a flat bed without using  bedrails?: None Help needed moving from lying on your back to sitting on the side of a flat bed without using bedrails?: A Little Help needed moving to and from a bed to a chair (including a wheelchair)?: A Little Help needed standing up from a chair using your arms (e.g., wheelchair or bedside chair)?: A Little Help needed to walk in hospital room?: A Little Help needed climbing 3-5 steps with a railing? : A Little 6 Click Score: 19    End of Session   Activity Tolerance: Patient tolerated treatment well Patient left: in chair;with chair alarm set;with call bell/phone within reach Nurse Communication: Mobility status PT Visit Diagnosis: Muscle weakness (generalized) (M62.81);Unsteadiness on feet (R26.81);Difficulty in walking, not elsewhere classified (R26.2)    Time: [id number] PT Time Calculation (min) (ACUTE ONLY): 23 min   Charges:   PT Evaluation $PT Eval Moderate Complexity: [place], PT, DPT 02/05/23, 10:19 AM   Waunita Schooner 02/05/2023, 10:17 AM

## 2023-02-05 NOTE — Evaluation (Signed)
Occupational Therapy Evaluation Patient Details Name: Randall [name] MRN: [id number] DOB: Oct 17, 1961 Today's Date: 02/05/2023   History of Present Illness Pt is a 62 y/o M admitted on 02/04/23 after presenting to the ED with c/o generalized weakness & AMS. Pt is being treated for acute encephalopathy 2/2 COVID-19. PMH: dementia, ESRD on HD, HFpEF, HTN, HLD, DM2, CVA, bell's palsy, anxiety, arthritis, CHF, heart murmur, stroke   Clinical Impression   Patient presenting with decreased Ind in self care,balance, functional mobility/transfers, endurance, and safety awareness. Patient reports living with sister and does not use AD at baseline. Pt reports being Ind for self care tasks and sister assisting with taking him to dialysis and assisting with IADLs.Pt seated in recliner chair and appears very fatigued and having difficulty staying awake. He requests to return to bed. Pt stands with min A and transfers with min guard. Patient will benefit from acute OT to increase overall independence in the areas of ADLs, functional mobility, and safety awareness in order to safely discharge.     Recommendations for follow up therapy are one component of a multi-disciplinary discharge planning process, led by the attending physician.  Recommendations may be updated based on patient status, additional functional criteria and insurance authorization.   Assistance Recommended at Discharge Intermittent Supervision/Assistance  Patient can return home with the following A little help with walking and/or transfers;A little help with bathing/dressing/bathroom;Assist for transportation;Assistance with cooking/housework;Help with stairs or ramp for entrance    Functional Status Assessment  Patient has had a recent decline in their functional status and demonstrates the ability to make significant improvements in function in a reasonable and predictable amount of time.  Equipment Recommendations  None recommended by OT        Precautions / Restrictions Precautions Precautions: Fall Restrictions Weight Bearing Restrictions: No      Mobility Bed Mobility Overal bed mobility: Needs Assistance Bed Mobility: Sit to Supine       Sit to supine: Min guard        Transfers Overall transfer level: Needs assistance Equipment used: None Transfers: Sit to/from Stand, Bed to chair/wheelchair/BSC Sit to Stand: Min assist     Step pivot transfers: Min guard     General transfer comment: sit <>stand from recliner chair with min lifting assistance and min guard for several steps to bed      Balance Overall balance assessment: Needs assistance Sitting-balance support: Feet supported Sitting balance-Leahy Scale: Good     Standing balance support: No upper extremity supported, During functional activity Standing balance-Leahy Scale: Fair                             ADL either performed or assessed with clinical judgement   ADL Overall ADL's : Needs assistance/impaired     Grooming: Wash/dry hands;Standing;Min guard               Lower Body Dressing: Min guard;Minimal assistance;Sit to/from stand   Toilet Transfer: Minimal assistance Armed forces technical officer Details (indicate cue type and reason): simulated         Functional mobility during ADLs: Min guard       Vision Patient Visual Report: No change from baseline              Pertinent Vitals/Pain Pain Assessment Pain Assessment: No/denies pain     Hand Dominance Right   Extremity/Trunk Assessment Upper Extremity Assessment Upper Extremity Assessment: Generalized weakness   Lower Extremity Assessment  Lower Extremity Assessment: Generalized weakness       Communication Communication Communication: No difficulties   Cognition Arousal/Alertness: Lethargic Behavior During Therapy: Flat affect Overall Cognitive Status: History of cognitive impairments - at baseline                                  General Comments: Pt oriented to self and location. He states month is "January" and that he takes dialysis on "Monday, Tuesday, and Wednesday". Pt follows simple commands with increased time to process.     General Comments  Pt with difficulty swallowing last pill still in mouth - required several sips of drink & applesauce - nurse notified. PT assisted with meal tray set up.            Home Living Family/patient expects to be discharged to:: Private residence Living Arrangements: Other relatives (sister) Available Help at Discharge: Family;Available 24 hours/day Type of Home: House Home Access: Stairs to enter [employer] of Steps: 4-5 Entrance Stairs-Rails: None Home Layout: One level     Bathroom Shower/Tub: Teacher, early years/pre: Standard     Home Equipment: Tub bench;Hand held shower head          Prior Functioning/Environment Prior Level of Function : Independent/Modified Independent             Mobility Comments: Pt ambulating without AD, 1 fall ~3 weeks ago. ADLs Comments: Pt endorsed being Ind in self care with sister taking him to appointments and HD. Sister performs all IADLs        OT Problem List: Decreased strength;Decreased activity tolerance;Decreased safety awareness;Impaired balance (sitting and/or standing);Decreased knowledge of use of DME or AE      OT Treatment/Interventions: Self-care/ADL training;Therapeutic exercise;Therapeutic activities;Energy conservation;DME and/or AE instruction;Patient/family education;Balance training    OT Goals(Current goals can be found in the care plan section) Acute Rehab OT Goals Patient Stated Goal: to feel better OT Goal Formulation: With patient Time For Goal Achievement: 02/19/23 Potential to Achieve Goals: Good ADL Goals Pt Will Perform Grooming: with modified independence;standing Pt Will Perform Lower Body Dressing: with modified independence;sit to/from stand Pt Will  Transfer to Toilet: with modified independence;ambulating Pt Will Perform Toileting - Clothing Manipulation and hygiene: with modified independence;sit to/from stand  OT Frequency: Min 1X/week       AM-PAC OT "6 Clicks" Daily Activity     Outcome Measure Help from another person eating meals?: None Help from another person taking care of personal grooming?: None Help from another person toileting, which includes using toliet, bedpan, or urinal?: A Little Help from another person bathing (including washing, rinsing, drying)?: A Little Help from another person to put on and taking off regular upper body clothing?: None Help from another person to put on and taking off regular lower body clothing?: A Little 6 Click Score: 21   End of Session Nurse Communication: Mobility status  Activity Tolerance: Patient limited by fatigue Patient left: in bed;with call bell/phone within reach;with bed alarm set  OT Visit Diagnosis: Unsteadiness on feet (R26.81);Muscle weakness (generalized) (M62.81);History of falling (Z91.81)                Time: [id number] OT Time Calculation (min): 16 min Charges:  OT General Charges $OT Visit: 1 Visit OT Evaluation $OT Eval Low Complexity: 1 Low OT Treatments $Therapeutic Activity: 8-22 mins  Darleen Crocker, MS, OTR/L , CBIS ascom [phone]  02/05/23, 11:17  AM  

## 2023-02-05 NOTE — TOC Initial Note (Signed)
Transition of Care [facility]) - Initial/Assessment Note    Patient Details  Name: Randall [name] MRN: [id number] Date of Birth: 1961-05-19  [facility] CM/SW Contact:    Beverly Sessions, RN Phone Number: 02/05/2023, 1:18 PM  Clinical Narrative:                    Admitted for: Acute encephalopathy  Admitted from: Home with sister [name] PCP: Wynell Balloon   Therapy recommending home health. Sister in agreement and states that she does not have a preference of home health agency.  Referral made to [facility] with [facility].  Referral for WC made Kristen with [employer]   Sister states that she will transport at discharge       Patient Goals and CMS Choice            Expected Discharge Plan and Services                                              Prior Living Arrangements/Services                       Activities of Daily Living      Permission Sought/Granted                  Emotional Assessment              Admission diagnosis:  Acute encephalopathy [G93.40] Generalized weakness [R53.1] COVID-19 [U07.1] Patient Active Problem List   Diagnosis Date Noted   Acute encephalopathy 02/04/2023   COVID-19 virus infection 02/04/2023   Weakness on left side of face 02/04/2023   Type 2 diabetes mellitus 02/04/2023   ESRD on dialysis 06/06/2022   Hyperlipidemia 06/06/2022   Gastroesophageal reflux disease 03/07/2022   Duodenitis 03/07/2022   Frailty 03/02/2022   Essential hypertension 12/31/2021   Intractable nausea and vomiting 12/24/2021   Recurrent vomiting 12/23/2021   Acute renal failure superimposed on stage 3a chronic kidney disease 12/23/2021   Leukocytosis 12/23/2021   Moderate major depression 12/23/2021   UTI (urinary tract infection) 12/10/2021   Dementia 99991111   Acute metabolic encephalopathy 99991111   Nausea and vomiting 12/09/2021   Elevated troponin 99991111   Chronic systolic CHF (congestive heart  failure) 12/09/2021   History of CVA (cerebrovascular accident) 12/09/2021   Stage 3a chronic kidney disease 12/09/2021   Elevated lipase 12/09/2021   Esophageal stenosis 03/02/2015   Hemorrhagic cerebrovascular accident (CVA) 02/16/2015   CVA (cerebral vascular accident) 11/06/2009   PCP:  Thayer Headings, MD Pharmacy:   [id number] [address], [place] [address] [place] [address] Phone: [phone] Fax: [place], [place] - [address] [place] [address] Phone: [phone] Fax: [phone]     Social Determinants of Health (SDOH) Social History: SDOH Screenings   Tobacco Use: Medium Risk (02/04/2023)   SDOH Interventions:     Readmission Risk Interventions     No data to display

## 2023-02-05 NOTE — Plan of Care (Signed)
Pt alert and oriented x 2. Stand at bedside 1 assist weakness noted in legs. Incontinent. Used urinal with assistance this am. Hemo m-w-f per report. Fistulat right arm. Heparin for dvt prevention. No prns given this shift. BS was 72.  Problem: Education: Goal: Ability to describe self-care measures that may prevent or decrease complications (Diabetes Survival Skills Education) will improve Outcome: Progressing Goal: Individualized Educational Video(s) Outcome: Progressing   Problem: Coping: Goal: Ability to adjust to condition or change in health will improve Outcome: Progressing   Problem: Fluid Volume: Goal: Ability to maintain a balanced intake and output will improve Outcome: Progressing   Problem: Health Behavior/Discharge Planning: Goal: Ability to identify and utilize available resources and services will improve Outcome: Progressing Goal: Ability to manage health-related needs will improve Outcome: Progressing   Problem: Metabolic: Goal: Ability to maintain appropriate glucose levels will improve Outcome: Progressing   Problem: Nutritional: Goal: Maintenance of adequate nutrition will improve Outcome: Progressing Goal: Progress toward achieving an optimal weight will improve Outcome: Progressing   Problem: Skin Integrity: Goal: Risk for impaired skin integrity will decrease Outcome: Progressing   Problem: Tissue Perfusion: Goal: Adequacy of tissue perfusion will improve Outcome: Progressing   Problem: Education: Goal: Knowledge of General Education information will improve Description: Including pain rating scale, medication(s)/side effects and non-pharmacologic comfort measures Outcome: Progressing   Problem: Health Behavior/Discharge Planning: Goal: Ability to manage health-related needs will improve Outcome: Progressing   Problem: Clinical Measurements: Goal: Ability to maintain clinical measurements within normal limits will improve Outcome:  Progressing Goal: Will remain free from infection Outcome: Progressing Goal: Diagnostic test results will improve Outcome: Progressing Goal: Respiratory complications will improve Outcome: Progressing Goal: Cardiovascular complication will be avoided Outcome: Progressing   Problem: Activity: Goal: Risk for activity intolerance will decrease Outcome: Progressing   Problem: Nutrition: Goal: Adequate nutrition will be maintained Outcome: Progressing   Problem: Coping: Goal: Level of anxiety will decrease Outcome: Progressing   Problem: Elimination: Goal: Will not experience complications related to bowel motility Outcome: Progressing Goal: Will not experience complications related to urinary retention Outcome: Progressing   Problem: Pain Managment: Goal: General experience of comfort will improve Outcome: Progressing   Problem: Safety: Goal: Ability to remain free from injury will improve Outcome: Progressing   Problem: Skin Integrity: Goal: Risk for impaired skin integrity will decrease Outcome: Progressing

## 2023-02-05 NOTE — Progress Notes (Signed)
HD treatment started for this patient following HD parameters. After an hour, at 1800, there were continuous TMP and pressure alarms, machine asked to change cartridge or end of treatment. Dr. Juleen China informed. Change cartridge was selected. New set up done and primed new lines for the second time. While priming lines, self tests failed and machine is asking for another change of cartridge. New set up and primed again, machine passed finally. At 1915, treatment re-started. Informed Dr. Juleen China about the delay of treatment.

## 2023-02-05 NOTE — Care Management Obs Status (Signed)
[place] NOTIFICATION   Patient Details  Name: Randall [name] MRN: [id number] Date of Birth: Mar 11, 1961   Medicare Observation Status Notification Given:  Yes    Beverly Sessions, RN 02/05/2023, 1:17 PM

## 2023-02-05 NOTE — Progress Notes (Signed)
Received patient in bed to unit.  Alert and oriented.  Informed consent signed and in chart.   TX duration: 1 hour  Patient tolerated well.  Transported back to the room  Alert, without acute distress.  Hand-off given to patient's nurse.   Access used: AVF right upper arm Access issues: None  Total UF removed: 0L Medication(s) given: None Post HD VS: 154/96 Post HD weight: 82.9kg   Randall [name] [facility]

## 2023-02-05 NOTE — Discharge Planning (Signed)
[place],  [id number] [phone]  Scheduled Days: Monday Wednesday and Friday  Treatment Time: 10:30am  Confirmed above schedule with clinic. Patient tested COVID + 3/31. Clinic is aware, there are no changes to above schedule for Isolation. Clinic's special instructions are: Patient needs to enter through side door.

## 2023-02-05 NOTE — Progress Notes (Signed)
Progress Note   Patient: Randall [name] [id number] DOB: 1961/01/11 DOA: 02/04/2023     0 DOS: the patient was seen and examined on 02/05/2023     Subjective:  Patient seen and examined at bedside this morning He tells me he is doing okay Denies nausea vomiting chest pain or cough Able to follow commands appropriately   Brief [name] course: HPI: Randall [name] is a 62 y.o. male with medical history significant of dementia, ESRD on HD, HFpEF, hypertension, hyperlipidemia, type 2 diabetes, CVA, Bell's palsy who is presents to the ED due to generalized weakness and altered mental status.  History obtained from patient's sister at bedside with whom the patient lives. On arrival to the ED, patient was hypertensive at 150/69 with heart rate of 60.  He was saturating at 100% on room air.  He was afebrile at 98.4. Initial workup notable for COVID-19 PCR positive, WBC of 5.1, hemoglobin 10.9, sodium 134, chloride 95, glucose 111, BUN 29, creatinine 3.94 with GFR 17.  Troponin elevated at 45 with flat trend to 45.  Chest x-ray was obtained that did not show any acute cardiopulmonary disease.     Physical Exam Vitals and nursing note reviewed.  Constitutional:      Appearance: He is obese. He is not ill-appearing.  HENT:     Head: Normocephalic and atraumatic.  Eyes:     Conjunctiva/sclera: Conjunctivae normal.  Cardiovascular:     Rate and Rhythm: Normal rate and regular rhythm.  Pulmonary:     Effort: Pulmonary effort is normal. No respiratory distress.     Breath sounds: Normal breath sounds. No wheezing, rhonchi or rales.  Abdominal:  CNS: Able to follow commands no weakness appreciated    Assessment and Plan:   Acute metabolic encephalopathy likely secondary to COVID-19 infection Patient is presenting with increased generalized weakness, increased disorientation, likely in the setting of underlying infection with COVID-19.   History of dementia is likely exacerbating  symptoms. MRI of the brain did not show acute intracranial pathology - Management of COVID-19 as noted below - PT/OT - Continue neurochecks   COVID-19 virus infection Patient was exposed to COVID-19 approximately 3 to 4 days before presentation    No evidence of hypoxia to necessitate use of Decadron.   Given ESRD, unable to use Paxlovid. - Continuous pulse oximetry - Supportive management with Tylenol, as needed IV fluids   Weakness on left side of face -Resolved Given history of CVA with multiple risk factors Randall [name] of the brain was obtained which did not show acute intracranial pathology - Continue home aspirin and statin - Lipid panel pending   ESRD on dialysis Randall [name]) - Nephrology consulted; appreciate their recommendations - Dialysis scheduling per nephrology   Essential hypertension - Home medication resumed   Chronic systolic CHF (congestive heart failure) (Randall [name]) Patient appears euvolemic on examination today.  He is not on any diuretics at this time. Continue home medications  Dementia Randall [name]) Patient has a history of underlying dementia, however patient sister at bedside states he has been more altered over the last few days.   - Delirium precautions   Advance Care Planning:    Code Status: DNR.  Per patient's MOST form on the chart, patient would not want CPR, however he would want full respiratory resuscitation.     Consults: None   Family Communication: Patient sister updated     Data Reviewed: Have reviewed the patient's MRI of the brain report that did not show acute intracranial  pathology.   Disposition: Home  Planned Discharge Destination: Home with Home Health   Time spent: 40 minutes    Vitals:   02/04/23 1722 02/04/23 2218 02/05/23 0452 02/05/23 1200  BP: (!) 130/105 (!) 188/84 (!) 159/70 (!) 148/75  Pulse: 66 71 80 85  Resp: 16 20 20  (!) 22  Temp: 98.3 F (36.8 C) 98.6 F (37 C)  98.7 F (37.1 C)  TempSrc: Oral   Oral  SpO2: 98% 100%  100% 100%  Weight:      Height:         Author: Verline Lema, MD 02/05/2023 2:55 PM  For on call review [email or web address].

## 2023-02-06 ENCOUNTER — Encounter (INDEPENDENT_AMBULATORY_CARE_PROVIDER_SITE_OTHER): Payer: Self-pay

## 2023-02-06 ENCOUNTER — Ambulatory Visit (INDEPENDENT_AMBULATORY_CARE_PROVIDER_SITE_OTHER): Payer: 59 | Admitting: Vascular Surgery

## 2023-02-06 ENCOUNTER — Encounter (INDEPENDENT_AMBULATORY_CARE_PROVIDER_SITE_OTHER): Payer: 59

## 2023-02-06 DIAGNOSIS — G9340 Encephalopathy, unspecified: Secondary | ICD-10-CM | POA: Diagnosis not present

## 2023-02-06 LAB — CBC WITH DIFFERENTIAL/PLATELET
Abs Immature Granulocytes: 0.04 10*3/uL (ref 0.00–0.07)
Basophils Absolute: 0 10*3/uL (ref 0.0–0.1)
Basophils Relative: 0 %
Eosinophils Absolute: 0.1 10*3/uL (ref 0.0–0.5)
Eosinophils Relative: 1 %
HCT: 34.1 % — ABNORMAL LOW (ref 39.0–52.0)
Hemoglobin: 11.5 g/dL — ABNORMAL LOW (ref 13.0–17.0)
Immature Granulocytes: 0 %
Lymphocytes Relative: 11 %
Lymphs Abs: 1 10*3/uL (ref 0.7–4.0)
MCH: 31.4 pg (ref 26.0–34.0)
MCHC: 33.7 g/dL (ref 30.0–36.0)
MCV: 93.2 fL (ref 80.0–100.0)
Monocytes Absolute: 0.9 10*3/uL (ref 0.1–1.0)
Monocytes Relative: 10 %
Neutro Abs: 7.2 10*3/uL (ref 1.7–7.7)
Neutrophils Relative %: 78 %
Platelets: 214 10*3/uL (ref 150–400)
RBC: 3.66 MIL/uL — ABNORMAL LOW (ref 4.22–5.81)
RDW: 13.1 % (ref 11.5–15.5)
WBC: 9.3 10*3/uL (ref 4.0–10.5)
nRBC: 0 % (ref 0.0–0.2)

## 2023-02-06 LAB — BASIC METABOLIC PANEL
Anion gap: 9 (ref 5–15)
BUN: 32 mg/dL — ABNORMAL HIGH (ref 8–23)
CO2: 26 mmol/L (ref 22–32)
Calcium: 8.5 mg/dL — ABNORMAL LOW (ref 8.9–10.3)
Chloride: 100 mmol/L (ref 98–111)
Creatinine, Ser: 3.5 mg/dL — ABNORMAL HIGH (ref 0.61–1.24)
GFR, Estimated: 19 mL/min — ABNORMAL LOW (ref >60)
Glucose, Bld: 89 mg/dL (ref 70–99)
Potassium: 3.8 mmol/L (ref 3.5–5.1)
Sodium: 135 mmol/L (ref 135–145)

## 2023-02-06 LAB — HEPATITIS B SURFACE ANTIGEN: Hepatitis B Surface Ag: NONREACTIVE

## 2023-02-06 LAB — GLUCOSE, CAPILLARY
Glucose-Capillary: 70 mg/dL (ref 70–99)
Glucose-Capillary: 122 mg/dL — ABNORMAL HIGH (ref 70–99)
Glucose-Capillary: 98 mg/dL (ref 70–99)
Glucose-Capillary: 105 mg/dL — ABNORMAL HIGH (ref 70–99)

## 2023-02-06 LAB — HEMOGLOBIN A1C
Hgb A1c MFr Bld: 5.1 % (ref 4.8–5.6)
Mean Plasma Glucose: 100 mg/dL

## 2023-02-06 NOTE — Progress Notes (Signed)
Physical Therapy Treatment Patient Details Name: Randall [name] MRN: [id number] DOB: October 27, 1961 Today's Date: 02/06/2023   History of Present Illness Randall [name] is a 62 y/o M admitted on 02/04/23 after presenting to the ED with c/o generalized weakness & AMS. Pt is being treated for acute encephalopathy 2/2 COVID-19. PMH: dementia, ESRD on HD, HFpEF, HTN, HLD, DM2, CVA, bell's palsy, anxiety, arthritis, CHF, heart murmur, stroke.    PT Comments    Pt in recliner on entry, asleep, does not alert particularly well when engaged and somnolence dominates session. Pt able to partake in 4 STS from recliner in session, stil requires minA to rise, and able to balance self somewhat, but remains weak and does not tolerate standing for long, which makes sense because he never fully wakes during session. Vitals assessed after persistent state- sats 100%, BP in goal range. Pt made comfortable in recliner at end of session to facilitate continued nap. RN made aware.    Recommendations for follow up therapy are one component of a multi-disciplinary discharge planning process, led by the attending physician.  Recommendations may be updated based on patient status, additional functional criteria and insurance authorization.  Follow Up Recommendations       Assistance Recommended at Discharge Frequent or constant Supervision/Assistance  Patient can return home with the following A little help with walking and/or transfers;A little help with bathing/dressing/bathroom;Assistance with cooking/housework;Assist for transportation;Help with stairs or ramp for entrance   Equipment Recommendations  None recommended by PT    Recommendations for Other Services       Precautions / Restrictions Precautions Precautions: Fall Restrictions Weight Bearing Restrictions: No     Mobility  Bed Mobility               General bed mobility comments: already in chair on entry    Transfers Overall  transfer level: Needs assistance Equipment used: 1 person hand held assist Transfers: Sit to/from Stand Sit to Stand: Min assist                Ambulation/Gait Ambulation/Gait assistance:  (unable to safely attempt due to somnolence today)                 Stairs             Wheelchair Mobility    Modified Rankin (Stroke Patients Only)       Balance                                            Cognition Arousal/Alertness: Lethargic Behavior During Therapy: Flat affect                                   General Comments: This session, lethargy/somnolence dominates, significant difficulty arousing for session mobility        Exercises      General Comments        Pertinent Vitals/Pain Pain Assessment Pain Assessment: No/denies pain    Home Living                          Prior Function            PT Goals (current goals can now be found in the care plan section) Acute Rehab PT Goals Patient Stated  Goal: get better PT Goal Formulation: With patient/family Time For Goal Achievement: 02/19/23 Potential to Achieve Goals: Good Progress towards PT goals: Not progressing toward goals - comment    Frequency    Min 2X/week      PT Plan Current plan remains appropriate    Co-evaluation              AM-PAC PT "6 Clicks" Mobility   Outcome Measure  Help needed turning from your back to your side while in a flat bed without using bedrails?: None Help needed moving from lying on your back to sitting on the side of a flat bed without using bedrails?: A Little Help needed moving to and from a bed to a chair (including a wheelchair)?: A Little Help needed standing up from a chair using your arms (e.g., wheelchair or bedside chair)?: A Little Help needed to walk in hospital room?: A Little Help needed climbing 3-5 steps with a railing? : A Little 6 Click Score: 19    End of Session    Activity Tolerance: Patient limited by lethargy Patient left: in chair;with chair alarm set;with call bell/phone within reach Nurse Communication: Mobility status PT Visit Diagnosis: Muscle weakness (generalized) (M62.81);Unsteadiness on feet (R26.81);Difficulty in walking, not elsewhere classified (R26.2);Other symptoms and signs involving the nervous system (R29.898)     Time: [id number] PT Time Calculation (min) (ACUTE ONLY): 23 min  Charges:  $Therapeutic Activity: 23-37 mins                    1:43 PM, 02/06/23 Etta Grandchild, PT, DPT Physical Therapist - [facility]  [phone] ([place])    Shickley C 02/06/2023, 1:41 PM

## 2023-02-06 NOTE — Progress Notes (Signed)
Progress Note   Patient: Randall [name] [id number] DOB: 1961-10-20 DOA: 02/04/2023     0 DOS: the patient was seen and examined on 02/06/2023     Subjective:  Patient seen and examined at bedside this morning He tells me he is doing okay Patient appeared confused this morning and only able to answer orientation questions x 2 Denies nausea vomiting chest pain or cough Able to follow commands appropriately     Brief hospital course: HPI: Randall [name] is a 62 y.o. male with medical history significant of dementia, ESRD on HD, HFpEF, hypertension, hyperlipidemia, type 2 diabetes, CVA, Bell's palsy who is presents to the ED due to generalized weakness and altered mental status.  History obtained from patient's sister at bedside with whom the patient lives. On arrival to the ED, patient was hypertensive at 150/69 with heart rate of 60.  He was saturating at 100% on room air.  He was afebrile at 98.4. Initial workup notable for COVID-19 PCR positive, WBC of 5.1, hemoglobin 10.9, sodium 134, chloride 95, glucose 111, BUN 29, creatinine 3.94 with GFR 17.  Troponin elevated at 45 with flat trend to 45.  Chest x-ray was obtained that did not show any acute cardiopulmonary disease.       Physical Exam Vitals and nursing note reviewed.  Constitutional:      Appearance: He is obese. He is not ill-appearing.  HENT:     Head: Normocephalic and atraumatic.  Eyes:     Conjunctiva/sclera: Conjunctivae normal.  Cardiovascular:     Rate and Rhythm: Normal rate and regular rhythm.  Pulmonary:     Effort: Pulmonary effort is normal. No respiratory distress.     Breath sounds: Normal breath sounds. No wheezing, rhonchi or rales.  Abdominal:  CNS: Able to follow commands no weakness appreciated only alert and oriented x 2       Assessment and Plan:    Acute metabolic encephalopathy likely secondary to COVID-19 infection Patient is presenting with increased generalized weakness, increased  disorientation, likely in the setting of underlying infection with COVID-19.   History of dementia is likely exacerbating symptoms. MRI of the brain did not show acute intracranial pathology - Management of COVID-19 as noted below - PT/OT - Continue neurochecks   COVID-19 virus infection Patient was exposed to COVID-19 approximately 3 to 4 days before presentation    No evidence of hypoxia to necessitate use of Decadron.   Given ESRD, unable to use Paxlovid. - Continuous pulse oximetry - Supportive management with Tylenol, as needed IV fluids   Weakness on left side of face -Resolved Given history of CVA with multiple risk factors Randall [name] of the brain was obtained which did not show acute intracranial pathology - Continue home aspirin and statin - Lipid panel pending   ESRD on dialysis [facility] 2) - Nephrology consulted; appreciate their recommendations - Dialysis scheduling per nephrology   Essential hypertension - Home medication resumed   Chronic systolic CHF (congestive heart failure) ([place]) Patient appears euvolemic on examination today.  He is not on any diuretics at this time. Continue home medications   Dementia [facility]) Patient has a history of underlying dementia, however patient sister at bedside states he has been more altered over the last few days. - Delirium precautions   Advance Care Planning:    Code Status: DNR.  Per patient's MOST form on the chart, patient would not want CPR, however he would want full respiratory resuscitation.     Consults: None  Family Communication: Patient sister updated      Data Reviewed: Have reviewed the patient's obesity as well as BMP results   Disposition: Home  Planned Discharge Destination: Home with Home Health     Time spent: 38 minutes        Vitals:   02/05/23 2100 02/06/23 0540 02/06/23 1014 02/06/23 1325  BP: (!) 171/91 (!) 152/68 (!) 145/73 134/64  Pulse: 69 89 72   Resp: 18 20 20    Temp: 98.8 F (37.1 C)  98.4 F (36.9 C)    TempSrc:      SpO2: 99% 100% 100% 100%  Weight:  86.3 kg    Height:        Author: Verline Lema, MD 02/06/2023 2:22 PM  For on call review [email or web address].

## 2023-02-06 NOTE — Progress Notes (Signed)
[facility]  ROUNDING NOTE   Subjective:   Randall [name] is a 62 year old male with past medical conditions including hypertension, type 2 diabetes, Bell's palsy, CVA, CHF, hyperlipidemia, and end-stage renal disease on hemodialysis.  Patient presents to the emergency department complaining of weakness and confusion.  Patient has been admitted under observation for Acute encephalopathy [G93.40] Generalized weakness [R53.1] COVID-19 [U07.1]  Patient is known to our practice and receives outpatient dialysis treatments at [facility] on a MWF schedule, supervised by Dr. Holley Raring.    Patient sitting up in bed RN at bedside during med pass Alert without verbal response Breakfast tray at bedside  Dialysis terminated yesterday due to multiple alarms Patient received 1 hour of treatment  Objective:  Vital signs in last 24 hours:  Temp:  [97.3 F (36.3 C)-98.8 F (37.1 C)] 98.4 F (36.9 C) (04/02 0540) Pulse Rate:  [64-89] 72 (04/02 1014) Resp:  [14-22] 20 (04/02 1014) BP: (145-194)/(57-106) 145/73 (04/02 1014) SpO2:  [95 %-100 %] 100 % (04/02 1014) Weight:  [82.9 kg-86.3 kg] 86.3 kg (04/02 0540)  Weight change: -1.1 kg Filed Weights   02/05/23 1700 02/05/23 1945 02/06/23 0540  Weight: 82.9 kg 82.9 kg 86.3 kg    Intake/Output: I/O last 3 completed shifts: In: -  Out: 200 [Urine:200]   Intake/Output this shift:  No intake/output data recorded.  Physical Exam: General: NAD  Head: Normocephalic, atraumatic. Moist oral mucosal membranes  Eyes: Anicteric  Lungs:  Clear to auscultation, normal effort  Heart: Regular rate and rhythm  Abdomen:  Soft, nontender  Extremities: No peripheral edema.  Neurologic: Alert with delayed response, moving all four extremities  Skin: No lesions  Access: Right aVF    Basic Metabolic Panel: Recent Labs  Lab 02/04/23 1301 02/05/23 0348 02/06/23 0656  NA 134* 134* 135  K 3.8 3.6 3.8  CL 95* 98 100  CO2 28 26 26    GLUCOSE 111* 85 89  BUN 29* 37* 32*  CREATININE 3.94* 4.08* 3.50*  CALCIUM 8.6* 8.8* 8.5*     Liver Function Tests: Recent Labs  Lab 02/05/23 0348  AST 20  ALT 13  ALKPHOS 77  BILITOT 0.8  PROT 7.5  ALBUMIN 3.7    No results for input(s): "LIPASE", "AMYLASE" in the last 168 hours. No results for input(s): "AMMONIA" in the last 168 hours.  CBC: Recent Labs  Lab 02/04/23 1301 02/05/23 0348 02/06/23 0656  WBC 5.1 6.4 9.3  NEUTROABS 3.1  --  7.2  HGB 10.9* 10.9* 11.5*  HCT 32.8* 33.2* 34.1*  MCV 95.3 93.5 93.2  PLT 203 200 214     Cardiac Enzymes: No results for input(s): "CKTOTAL", "CKMB", "CKMBINDEX", "TROPONINI" in the last 168 hours.  BNP: Invalid input(s): "POCBNP"  CBG: Recent Labs  Lab 02/05/23 1211 02/05/23 1752 02/05/23 2110 02/06/23 1009 02/06/23 1200  GLUCAP 152* 87 92 40 122*     Microbiology: Results for orders placed or performed during the hospital encounter of 02/04/23  Resp panel by RT-PCR (RSV, Flu A&B, Covid) Anterior Nasal Swab     Status: Abnormal   Collection Time: 02/04/23 12:57 PM   Specimen: Anterior Nasal Swab  Result Value Ref Range Status   SARS Coronavirus 2 by RT PCR POSITIVE (A) NEGATIVE Final    Comment: (NOTE) SARS-CoV-2 target nucleic acids are DETECTED.  The SARS-CoV-2 RNA is generally detectable in upper respiratory specimens during the acute phase of infection. Positive results are indicative of the presence of the identified virus,  but do not rule out bacterial infection or co-infection with other pathogens not detected by the test. Clinical correlation with patient history and other diagnostic information is necessary to determine patient infection status. The expected result is Negative.  Fact Sheet for Patients: EntrepreneurPulse.com.au  Fact Sheet for Healthcare Providers: IncredibleEmployment.be  This test is not yet approved or cleared by the [place] FDA  and  has been authorized for detection and/or diagnosis of SARS-CoV-2 by FDA under an Emergency Use Authorization (EUA).  This EUA will remain in effect (meaning this test can be used) for the duration of  the COVID-19 declaration under Section 564(b)(1) of the A ct, 21 U.S.C. section 360bbb-3(b)(1), unless the authorization is terminated or revoked sooner.     Influenza A by PCR NEGATIVE NEGATIVE Final   Influenza B by PCR NEGATIVE NEGATIVE Final    Comment: (NOTE) The Xpert Xpress SARS-CoV-2/FLU/RSV plus assay is intended as an aid in the diagnosis of influenza from Nasopharyngeal swab specimens and should not be used as a sole basis for treatment. Nasal washings and aspirates are unacceptable for Xpert Xpress SARS-CoV-2/FLU/RSV testing.  Fact Sheet for Patients: EntrepreneurPulse.com.au  Fact Sheet for Healthcare Providers: IncredibleEmployment.be  This test is not yet approved or cleared by the [place] FDA and has been authorized for detection and/or diagnosis of SARS-CoV-2 by FDA under an Emergency Use Authorization (EUA). This EUA will remain in effect (meaning this test can be used) for the duration of the COVID-19 declaration under Section 564(b)(1) of the Act, 21 U.S.C. section 360bbb-3(b)(1), unless the authorization is terminated or revoked.     Resp Syncytial Virus by PCR NEGATIVE NEGATIVE Final    Comment: (NOTE) Fact Sheet for Patients: EntrepreneurPulse.com.au  Fact Sheet for Healthcare Providers: IncredibleEmployment.be  This test is not yet approved or cleared by the [place] FDA and has been authorized for detection and/or diagnosis of SARS-CoV-2 by FDA under an Emergency Use Authorization (EUA). This EUA will remain in effect (meaning this test can be used) for the duration of the COVID-19 declaration under Section 564(b)(1) of the Act, 21 U.S.C. section 360bbb-3(b)(1),  unless the authorization is terminated or revoked.  Performed at [facility], [address] [place], [place] [address]     Coagulation Studies: No results for input(s): "LABPROT", "INR" in the last 72 hours.  Urinalysis: No results for input(s): "COLORURINE", "LABSPEC", "PHURINE", "GLUCOSEU", "HGBUR", "BILIRUBINUR", "KETONESUR", "PROTEINUR", "UROBILINOGEN", "NITRITE", "LEUKOCYTESUR" in the last 72 hours.  Invalid input(s): "APPERANCEUR"    Imaging: MR BRAIN WO CONTRAST  Result Date: 02/04/2023 CLINICAL DATA:  Neuro deficit, acute, stroke suspected. Weakness. Altered mental status. EXAM: MRI HEAD WITHOUT CONTRAST TECHNIQUE: Multiplanar, multiecho pulse sequences of the brain and surrounding structures were obtained without intravenous contrast. COMPARISON:  01/15/2023 FINDINGS: Brain: Diffusion imaging does not show any acute or subacute infarction or other cause of restricted diffusion. Extensive chronic small-vessel ischemic changes are seen throughout the pons. Old lacunar infarction in the right pons. Few old small vessel cerebellar infarctions. Old small vessel infarctions of the thalami. Advanced and confluent chronic small vessel ischemic changes throughout the cerebral hemispheric white matter. No cortical or large vessel territory infarction. No mass lesion, hydrocephalus or extra-axial collection. Hemosiderin deposition throughout the brain associated with many of the old small vessel insults. Findings suggest chronic hypertensive encephalopathy. More extensive area of hemosiderin deposition in the region of the white matter posterior to the atrium of the right lateral ventricle. No evidence of acute hemorrhage. Vascular: Major vessels  at the base of the brain show flow. Skull and upper cervical spine: Negative Sinuses/Orbits: Clear/normal Other: None IMPRESSION: No acute finding. Extensive chronic small-vessel ischemic changes throughout the brain as outlined above.  Hemosiderin deposition throughout the brain in association with the old infarctions consistent with chronic hypertensive encephalopathy. Electronically Signed   By: Nelson Chimes M.D.   On: 02/04/2023 20:10   DG Chest Port 1 View  Result Date: 02/04/2023 CLINICAL DATA:  Cough EXAM: PORTABLE CHEST 1 VIEW COMPARISON:  CXR 03/17/23 FINDINGS: No pleural effusion. No pneumothorax. Prominent cardiac contours, unchanged from prior. Unchanged mediastinal contours. No focal airspace opacity. No radiographically apparent displaced rib fractures. Visualized upper abdomen is unremarkable. Degenerative changes of the bilateral AC joints. IMPRESSION: No focal airspace opacity.  No pulmonary edema.  No pleural effusion Electronically Signed   By: Marin Roberts M.D.   On: 02/04/2023 13:34     Medications:    anticoagulant sodium citrate      aspirin EC  81 mg Oral Daily   carvedilol  25 mg Oral BID WC   Chlorhexidine Gluconate Cloth  6 each Topical Q0600   heparin  5,000 Units Subcutaneous Q8H   insulin aspart  0-6 Units Subcutaneous TID WC   multivitamin with minerals  1 tablet Oral Daily   pantoprazole  20 mg Oral Daily   rosuvastatin  10 mg Oral Daily   sertraline  100 mg Oral Daily   sodium chloride flush  3 mL Intravenous Q12H   tamsulosin  0.4 mg Oral Daily   thiamine  100 mg Oral Daily   acetaminophen **OR** acetaminophen, alteplase, anticoagulant sodium citrate, heparin, lidocaine (PF), lidocaine-prilocaine, ondansetron **OR** ondansetron (ZOFRAN) IV, pentafluoroprop-tetrafluoroeth, polyethylene glycol  Assessment/ Plan:  Randall [name] is a 62 y.o.  male with past medical conditions including hypertension, type 2 diabetes, Bell's palsy, CVA, CHF, hyperlipidemia, and end-stage renal disease on hemodialysis.  Patient presents to the emergency department complaining of weakness and confusion.  Patient has been admitted under observation for Acute encephalopathy [G93.40] Generalized weakness  [R53.1] COVID-19 [U07.1]  [facility]/MWF/right aVF  Hyponatremia with end-stage renal disease on hemodialysis.  Sodium 134 on ED arrival. Sodium corrected with dialysis. Treatment terminated after 1 hour yesterday due to increased permcth pressures.  Next treatment scheduled for Wednesday.  2. Anemia of chronic kidney disease Lab Results  Component Value Date   HGB 11.5 (L) 02/06/2023    Hemoglobin stable.  Patient receives Mircera at outpatient clinic.  No need for ESA's at this time.  3. Secondary Hyperparathyroidism: with outpatient labs: None Lab Results  Component Value Date   CALCIUM 8.5 (L) 02/06/2023   CAION 1.19 06/15/2022   PHOS 3.9 01/17/2022   Currently prescribed cholecalciferol outpatient. Bone minerals acceptable  4.  Hypertension with chronic kidney disease.  Home regimen includes carvedilol and Entresto.  Entresto currently held.    LOS: 0   4/2/20241:06 PM

## 2023-02-07 DIAGNOSIS — R55 Syncope and collapse: Secondary | ICD-10-CM | POA: Diagnosis not present

## 2023-02-07 DIAGNOSIS — E871 Hypo-osmolality and hyponatremia: Secondary | ICD-10-CM | POA: Insufficient documentation

## 2023-02-07 DIAGNOSIS — U071 COVID-19: Secondary | ICD-10-CM | POA: Diagnosis not present

## 2023-02-07 DIAGNOSIS — G9340 Encephalopathy, unspecified: Secondary | ICD-10-CM | POA: Diagnosis not present

## 2023-02-07 DIAGNOSIS — N186 End stage renal disease: Secondary | ICD-10-CM | POA: Diagnosis not present

## 2023-02-07 LAB — CBC WITH DIFFERENTIAL/PLATELET
Abs Immature Granulocytes: 0.03 10*3/uL (ref 0.00–0.07)
Basophils Absolute: 0 10*3/uL (ref 0.0–0.1)
Basophils Relative: 0 %
Eosinophils Absolute: 0.2 10*3/uL (ref 0.0–0.5)
Eosinophils Relative: 3 %
HCT: 31.9 % — ABNORMAL LOW (ref 39.0–52.0)
Hemoglobin: 10.7 g/dL — ABNORMAL LOW (ref 13.0–17.0)
Immature Granulocytes: 0 %
Lymphocytes Relative: 22 %
Lymphs Abs: 1.6 10*3/uL (ref 0.7–4.0)
MCH: 31.1 pg (ref 26.0–34.0)
MCHC: 33.5 g/dL (ref 30.0–36.0)
MCV: 92.7 fL (ref 80.0–100.0)
Monocytes Absolute: 0.8 10*3/uL (ref 0.1–1.0)
Monocytes Relative: 12 %
Neutro Abs: 4.5 10*3/uL (ref 1.7–7.7)
Neutrophils Relative %: 63 %
Platelets: 215 10*3/uL (ref 150–400)
RBC: 3.44 MIL/uL — ABNORMAL LOW (ref 4.22–5.81)
RDW: 13.3 % (ref 11.5–15.5)
WBC: 7.2 10*3/uL (ref 4.0–10.5)
nRBC: 0 % (ref 0.0–0.2)

## 2023-02-07 LAB — GLUCOSE, CAPILLARY
Glucose-Capillary: 95 mg/dL (ref 70–99)
Glucose-Capillary: 85 mg/dL (ref 70–99)
Glucose-Capillary: 84 mg/dL (ref 70–99)
Glucose-Capillary: 80 mg/dL (ref 70–99)
Glucose-Capillary: 167 mg/dL — ABNORMAL HIGH (ref 70–99)

## 2023-02-07 LAB — PHOSPHORUS: Phosphorus: 3.8 mg/dL (ref 2.5–4.6)

## 2023-02-07 LAB — HEPATITIS B SURFACE ANTIBODY, QUANTITATIVE: Hep B S AB Quant (Post): 2773 m[IU]/mL (ref >9.9)

## 2023-02-07 LAB — BASIC METABOLIC PANEL
Anion gap: 9 (ref 5–15)
BUN: 40 mg/dL — ABNORMAL HIGH (ref 8–23)
CO2: 23 mmol/L (ref 22–32)
Calcium: 8.6 mg/dL — ABNORMAL LOW (ref 8.9–10.3)
Chloride: 102 mmol/L (ref 98–111)
Creatinine, Ser: 4 mg/dL — ABNORMAL HIGH (ref 0.61–1.24)
GFR, Estimated: 16 mL/min — ABNORMAL LOW (ref >60)
Glucose, Bld: 89 mg/dL (ref 70–99)
Potassium: 3.9 mmol/L (ref 3.5–5.1)
Sodium: 134 mmol/L — ABNORMAL LOW (ref 135–145)

## 2023-02-07 NOTE — Progress Notes (Signed)
This is a no charge note  Patient is admitted due to acute metabolic encephalopathy and COVID-19 infection.  Patient had an MRI ofbrain which was negative for acute intracranial issues.  Patient is cleared for discharge, but per nurse report, after pt is transferred to his wheelchair, got downstairs to put him in the car, he started acting differently. Pt becomes more confused and is very lethargic.  No fully loss of consciousness.  Discharge order is canceled.  I was asked to see pt.. When I saw pt on the floor, he is lethargic, oriented to the person and place, not to time.  Per report, this is at his baseline.  Patient is agitated, does not allow me to do thorough examination, but patient is obviously moving all extremities.  He denies chest pain or abdominal pain.  No active respiratory distress, nausea, vomiting or diarrhea noted.  Vital sign: Blood pressure 185/83, heart rate 70, RR 20, oxygen saturation 100% on room air.  Temperature normal.  Assessment and plan: Etiology is not clear.  Per report, patient had dialysis with 1 L of fluid removed today.  Potential differential diagnosis is near syncope or delirium.  Patient had an MRI of the brain on 3/31 which was negative for acute intracranial issues. -Frequent neurocheck -Fall precaution -Observe closely   Ivor Costa, MD  [facility]   If 7PM-7AM, please contact night-coverage [email or web address] 02/07/2023, 5:29 PM

## 2023-02-07 NOTE — Progress Notes (Signed)
Mobility Specialist - Progress Note   02/07/23 1618  Mobility  Activity Dangled on edge of bed;Stood at bedside  Level of Assistance Moderate assist, patient does 50-74%  Assistive Device Front wheel walker  Activity Response Tolerated well  $Mobility charge 1 Mobility   Pt supine upon entry, utilizing RA. MS assisted Pt with clothing change. Pt transferred EOB ModA for BLE and trunk support. MS assisted donning socks, shirt and pants. Pt returned supine left with alarm set and needs within reach.   [place] Mobility Specialist 02/07/23 4:23 PM

## 2023-02-07 NOTE — Plan of Care (Signed)

## 2023-02-07 NOTE — Progress Notes (Signed)
Randall [name] to be D/C'd Home per MD order.  Discussed prescriptions and follow up appointments with the patient. Prescriptions given to patient, medication list explained in detail. Pt verbalized understanding.  Allergies as of 02/07/2023       Reactions   Penicillins    Reaction in childhood        Medication List     STOP taking these medications    pantoprazole 40 MG tablet Commonly known as: PROTONIX   sacubitril-valsartan 97-103 MG Commonly known as: ENTRESTO       TAKE these medications    aspirin EC 81 MG tablet Take 81 mg by mouth daily.   carvedilol 25 MG tablet Commonly known as: COREG Take 25 mg by mouth 2 (two) times daily with a meal.   cholecalciferol 25 MCG (1000 UNIT) tablet Commonly known as: VITAMIN D3 Take 1,000 Units by mouth daily.   cyanocobalamin 1000 MCG tablet Take 1,000 mcg by mouth daily.   lansoprazole 30 MG capsule Commonly known as: PREVACID Take 30 mg by mouth daily.   lidocaine-prilocaine cream Commonly known as: EMLA Apply 1 Application topically once.   rosuvastatin 10 MG tablet Commonly known as: CRESTOR Take 10 mg by mouth daily.   sertraline 100 MG tablet Commonly known as: ZOLOFT Take 100 mg by mouth daily.   tamsulosin 0.4 MG Caps capsule Commonly known as: FLOMAX Take 0.4 mg by mouth daily.   thiamine 100 MG tablet Commonly known as: VITAMIN B1 Take 1 tablet by mouth daily.               Durable Medical Equipment  (From admission, onward)           Start     Ordered   02/05/23 1309  For home use only DME lightweight manual wheelchair with seat cushion  Once       Comments: Patient suffers from dementia which impairs their ability to perform daily activities like dressing and toileting in the home.  A walker will not resolve  issue with performing activities of daily living. A wheelchair will allow patient to safely perform daily activities. Patient is not able to propel themselves in the  home using a standard weight wheelchair due to general weakness. Patient can self propel in the lightweight wheelchair. Length of need Lifetime. Accessories: elevating leg rests (ELRs), wheel locks, extensions and anti-tippers.   02/05/23 1308              Discharge Care Instructions  (From admission, onward)           Start     Ordered   02/07/23 0000  Discharge wound care:       Comments: none   02/07/23 1145            Vitals:   02/07/23 1217 02/07/23 1247  BP: (!) 169/67 (!) 185/53  Pulse: 70 70  Resp: 14 15  Temp:  98.3 F (36.8 C)  SpO2: 100% 100%    Skin clean, dry and intact without evidence of skin break down, no evidence of skin tears noted. IV catheter discontinued intact. Site without signs and symptoms of complications. Dressing and pressure applied. Pt denies pain at this time. No complaints noted.  An After Visit Summary was printed and given to the patient. Patient escorted via [place], and D/C home via private auto.  [place]

## 2023-02-07 NOTE — TOC Transition Note (Signed)
Transition of Care [facility]) - CM/SW Discharge Note   Patient Details  Name: Randall [name] MRN: [id number] Date of Birth: 1961/05/24  [facility]) CM/SW Contact:  Beverly Sessions, RN Phone Number: 02/07/2023, 2:05 PM   Clinical Narrative:     Patient to discharge today [name] with [name] notified of discharge        Patient Goals and CMS Choice      Discharge Placement                         Discharge Plan and Services Additional resources added to the After Visit Summary for                                       Social Determinants of Health (SDOH) Interventions SDOH Screenings   Tobacco Use: Medium Risk (02/04/2023)     Readmission Risk Interventions     No data to display

## 2023-02-07 NOTE — Discharge Summary (Signed)
Physician Discharge Summary   Patient: Randall [name] MRN: [id number] DOB: April 26, 1961  Admit date:     02/04/2023  Discharge date: 02/07/23  Discharge Physician: Sharen Hones   PCP: Thayer Headings, MD   Recommendations at discharge:   Follow-up with PCP 1 week.  Discharge Diagnoses: Principal Problem:   Acute encephalopathy Active Problems:   COVID-19 virus infection   Weakness on left side of face   Dementia with behavioral disturbance   Acute metabolic encephalopathy   Chronic systolic CHF (congestive heart failure)   History of CVA (cerebrovascular accident)   Essential hypertension   ESRD on dialysis   Type 2 diabetes mellitus   Hyponatremia  Resolved Problems:   * No resolved hospital problems. *  Hospital Course: Randall [name] is a 62 y.o. male with medical history significant of dementia, ESRD on HD, HFpEF, hypertension, hyperlipidemia, type 2 diabetes, CVA, Bell's palsy who is presents to the ED due to generalized weakness and altered mental status.  History obtained from patient's sister at bedside with whom the patient lives. On arrival to the ED, patient was hypertensive at 150/69 with heart rate of 60.  He was saturating at 100% on room air.  He was afebrile at 98.4. Initial workup notable for COVID-19 PCR positive, WBC of 5.1, hemoglobin 10.9, sodium 134, chloride 95, glucose 111, BUN 29, creatinine 3.94 with GFR 17.  Troponin elevated at 45 with flat trend to 45.  Chest x-ray was obtained that did not show any acute cardiopulmonary disease.   Patient was monitored in the hospital for 2 days, mental status had improved to baseline.  Discussed with patient sister, patient is medically stable to be discharged.  Will send home with PT/OT.  Assessment and Plan:  Acute metabolic encephalopathy likely secondary to COVID-19 infection Patient is presenting with increased generalized weakness, increased disorientation, likely in the setting of underlying infection  with COVID-19.   History of dementia is likely exacerbating symptoms. MRI of the brain did not show acute intracranial pathology Patient mental status has improved, medically stable to be discharged.   COVID-19 virus infection Patient did not develop any hypoxemia.  No pneumonia.  Still need to quarantine at home.  Weakness on left side of face MRI of the brain did not show any new stroke.     ESRD on dialysis [facility]) Follow-up with nephrology for continued hemodialysis.   Essential hypertension - Home medication resumed   Chronic systolic CHF (congestive heart failure) ([place]) Patient is euvolemic.   Dementia [facility]) PCP as outpatient.        Consultants: Nephrology Procedures performed: HD  Disposition: Home health Diet recommendation:  Discharge Diet Orders (From admission, onward)     Start     Ordered   02/07/23 0000  Diet - low sodium heart healthy        02/07/23 1145           Renal diet DISCHARGE MEDICATION: Allergies as of 02/07/2023       Reactions   Penicillins    Reaction in childhood        Medication List     STOP taking these medications    pantoprazole 40 MG tablet Commonly known as: PROTONIX   sacubitril-valsartan 97-103 MG Commonly known as: ENTRESTO       TAKE these medications    aspirin EC 81 MG tablet Take 81 mg by mouth daily.   carvedilol 25 MG tablet Commonly known as: COREG Take 25 mg by mouth  2 (two) times daily with a meal.   cholecalciferol 25 MCG (1000 UNIT) tablet Commonly known as: VITAMIN D3 Take 1,000 Units by mouth daily.   cyanocobalamin 1000 MCG tablet Take 1,000 mcg by mouth daily.   lansoprazole 30 MG capsule Commonly known as: PREVACID Take 30 mg by mouth daily.   lidocaine-prilocaine cream Commonly known as: EMLA Apply 1 Application topically once.   rosuvastatin 10 MG tablet Commonly known as: CRESTOR Take 10 mg by mouth daily.   sertraline 100 MG tablet Commonly known as: ZOLOFT Take  100 mg by mouth daily.   tamsulosin 0.4 MG Caps capsule Commonly known as: FLOMAX Take 0.4 mg by mouth daily.   thiamine 100 MG tablet Commonly known as: VITAMIN B1 Take 1 tablet by mouth daily.               Durable Medical Equipment  (From admission, onward)           Start     Ordered   02/05/23 1309  For home use only DME lightweight manual wheelchair with seat cushion  Once       Comments: Patient suffers from dementia which impairs their ability to perform daily activities like dressing and toileting in the home.  A walker will not resolve  issue with performing activities of daily living. A wheelchair will allow patient to safely perform daily activities. Patient is not able to propel themselves in the home using a standard weight wheelchair due to general weakness. Patient can self propel in the lightweight wheelchair. Length of need Lifetime. Accessories: elevating leg rests (ELRs), wheel locks, extensions and anti-tippers.   02/05/23 1308              Discharge Care Instructions  (From admission, onward)           Start     Ordered   02/07/23 0000  Discharge wound care:       Comments: none   02/07/23 1145            Follow-up Information     Thayer Headings, MD Follow up in 1 week(s).   Specialty: Internal Medicine Contact information: [place]  [phone]                Discharge Exam: Filed Weights   02/05/23 1945 02/06/23 0540 02/07/23 0839  Weight: 82.9 kg 86.3 kg 85.5 kg   General exam: Appears calm and comfortable  Respiratory system: Clear to auscultation. Respiratory effort normal. Cardiovascular system: S1 & S2 heard, RRR. No JVD, murmurs, rubs, gallops or clicks. No pedal edema. Gastrointestinal system: Abdomen is nondistended, soft and nontender. No organomegaly or masses felt. Normal bowel sounds heard. Central nervous system: Alert and oriented x2. No focal neurological  deficits. Extremities: Symmetric 5 x 5 power. Skin: No rashes, lesions or ulcers Psychiatry: Judgement and insight appear normal. Mood & affect appropriate.    Condition at discharge: good  The results of significant diagnostics from this hospitalization (including imaging, microbiology, ancillary and laboratory) are listed below for reference.   Imaging Studies: MR BRAIN WO CONTRAST  Result Date: 02/04/2023 CLINICAL DATA:  Neuro deficit, acute, stroke suspected. Weakness. Altered mental status. EXAM: MRI HEAD WITHOUT CONTRAST TECHNIQUE: Multiplanar, multiecho pulse sequences of the brain and surrounding structures were obtained without intravenous contrast. COMPARISON:  01/15/2023 FINDINGS: Brain: Diffusion imaging does not show any acute or subacute infarction or other cause of restricted diffusion. Extensive chronic small-vessel ischemic changes are seen  throughout the pons. Old lacunar infarction in the right pons. Few old small vessel cerebellar infarctions. Old small vessel infarctions of the thalami. Advanced and confluent chronic small vessel ischemic changes throughout the cerebral hemispheric white matter. No cortical or large vessel territory infarction. No mass lesion, hydrocephalus or extra-axial collection. Hemosiderin deposition throughout the brain associated with many of the old small vessel insults. Findings suggest chronic hypertensive encephalopathy. More extensive area of hemosiderin deposition in the region of the white matter posterior to the atrium of the right lateral ventricle. No evidence of acute hemorrhage. Vascular: Major vessels at the base of the brain show flow. Skull and upper cervical spine: Negative Sinuses/Orbits: Clear/normal Other: None IMPRESSION: No acute finding. Extensive chronic small-vessel ischemic changes throughout the brain as outlined above. Hemosiderin deposition throughout the brain in association with the old infarctions consistent with chronic  hypertensive encephalopathy. Electronically Signed   By: Nelson Chimes M.D.   On: 02/04/2023 20:10   DG Chest Port 1 View  Result Date: 02/04/2023 CLINICAL DATA:  Cough EXAM: PORTABLE CHEST 1 VIEW COMPARISON:  CXR 03/17/23 FINDINGS: No pleural effusion. No pneumothorax. Prominent cardiac contours, unchanged from prior. Unchanged mediastinal contours. No focal airspace opacity. No radiographically apparent displaced rib fractures. Visualized upper abdomen is unremarkable. Degenerative changes of the bilateral AC joints. IMPRESSION: No focal airspace opacity.  No pulmonary edema.  No pleural effusion Electronically Signed   By: Marin Roberts M.D.   On: 02/04/2023 13:34   CT Head Wo Contrast  Result Date: 01/15/2023 CLINICAL DATA:  General malaise EXAM: CT HEAD WITHOUT CONTRAST TECHNIQUE: Contiguous axial images were obtained from the base of the skull through the vertex without intravenous contrast. RADIATION DOSE REDUCTION: This exam was performed according to the departmental dose-optimization program which includes automated exposure control, adjustment of the mA and/or kV according to patient size and/or use of iterative reconstruction technique. COMPARISON:  06/21/2022 FINDINGS: Brain: No evidence of acute infarction, hemorrhage, mass, mass effect, or midline shift. No hydrocephalus or extra-axial fluid collection. Periventricular white matter changes, likely the sequela of chronic small vessel ischemic disease. Advanced cerebral atrophy for age. Vascular: No hyperdense vessel. Skull: Negative for fracture or focal lesion. Sinuses/Orbits: No acute finding. Other: The mastoid air cells are well aerated. IMPRESSION: No acute intracranial process. Electronically Signed   By: Merilyn Baba M.D.   On: 01/15/2023 19:22   DG Chest 2 View  Result Date: 01/15/2023 CLINICAL DATA:  Weakness, hypertension EXAM: CHEST - 2 VIEW COMPARISON:  12/09/2021 FINDINGS: The heart size and mediastinal contours are within  normal limits. Both lungs are clear. The visualized skeletal structures are unremarkable. IMPRESSION: No active cardiopulmonary disease. Electronically Signed   By: Randa Ngo M.D.   On: 01/15/2023 18:08    Microbiology: Results for orders placed or performed during the hospital encounter of 02/04/23  Resp panel by RT-PCR (RSV, Flu A&B, Covid) Anterior Nasal Swab     Status: Abnormal   Collection Time: 02/04/23 12:57 PM   Specimen: Anterior Nasal Swab  Result Value Ref Range Status   SARS Coronavirus 2 by RT PCR POSITIVE (A) NEGATIVE Final    Comment: (NOTE) SARS-CoV-2 target nucleic acids are DETECTED.  The SARS-CoV-2 RNA is generally detectable in upper respiratory specimens during the acute phase of infection. Positive results are indicative of the presence of the identified virus, but do not rule out bacterial infection or co-infection with other pathogens not detected by the test. Clinical correlation with patient history and other diagnostic  information is necessary to determine patient infection status. The expected result is Negative.  Fact Sheet for Patients: EntrepreneurPulse.com.au  Fact Sheet for Healthcare Providers: IncredibleEmployment.be  This test is not yet approved or cleared by the [place] FDA and  has been authorized for detection and/or diagnosis of SARS-CoV-2 by FDA under an Emergency Use Authorization (EUA).  This EUA will remain in effect (meaning this test can be used) for the duration of  the COVID-19 declaration under Section 564(b)(1) of the A ct, 21 U.S.C. section 360bbb-3(b)(1), unless the authorization is terminated or revoked sooner.     Influenza A by PCR NEGATIVE NEGATIVE Final   Influenza B by PCR NEGATIVE NEGATIVE Final    Comment: (NOTE) The Xpert Xpress SARS-CoV-2/FLU/RSV plus assay is intended as an aid in the diagnosis of influenza from Nasopharyngeal swab specimens and should not be used as  a sole basis for treatment. Nasal washings and aspirates are unacceptable for Xpert Xpress SARS-CoV-2/FLU/RSV testing.  Fact Sheet for Patients: EntrepreneurPulse.com.au  Fact Sheet for Healthcare Providers: IncredibleEmployment.be  This test is not yet approved or cleared by the [place] FDA and has been authorized for detection and/or diagnosis of SARS-CoV-2 by FDA under an Emergency Use Authorization (EUA). This EUA will remain in effect (meaning this test can be used) for the duration of the COVID-19 declaration under Section 564(b)(1) of the Act, 21 U.S.C. section 360bbb-3(b)(1), unless the authorization is terminated or revoked.     Resp Syncytial Virus by PCR NEGATIVE NEGATIVE Final    Comment: (NOTE) Fact Sheet for Patients: EntrepreneurPulse.com.au  Fact Sheet for Healthcare Providers: IncredibleEmployment.be  This test is not yet approved or cleared by the [place] FDA and has been authorized for detection and/or diagnosis of SARS-CoV-2 by FDA under an Emergency Use Authorization (EUA). This EUA will remain in effect (meaning this test can be used) for the duration of the COVID-19 declaration under Section 564(b)(1) of the Act, 21 U.S.C. section 360bbb-3(b)(1), unless the authorization is terminated or revoked.  Performed at [facility], [address] [place], [place] [address]     Labs: CBC: Recent Labs  Lab 02/04/23 1301 02/05/23 0348 02/06/23 0656 02/07/23 0916  WBC 5.1 6.4 9.3 7.2  NEUTROABS 3.1  --  7.2 4.5  HGB 10.9* 10.9* 11.5* 10.7*  HCT 32.8* 33.2* 34.1* 31.9*  MCV 95.3 93.5 93.2 92.7  PLT 203 200 214 [id number]   Basic Metabolic Panel: Recent Labs  Lab 02/04/23 1301 02/05/23 0348 02/06/23 0656 02/07/23 0916  NA 134* 134* 135 134*  K 3.8 3.6 3.8 3.9  CL 95* 98 100 102  CO2 28 26 26 23   GLUCOSE 111* 85 89 89  BUN 29* 37* 32* 40*  CREATININE 3.94*  4.08* 3.50* 4.00*  CALCIUM 8.6* 8.8* 8.5* 8.6*   Liver Function Tests: Recent Labs  Lab 02/05/23 0348  AST 20  ALT 13  ALKPHOS 77  BILITOT 0.8  PROT 7.5  ALBUMIN 3.7   CBG: Recent Labs  Lab 02/06/23 1200 02/06/23 1738 02/06/23 2109 02/07/23 0819 02/07/23 1126  GLUCAP 122* 98 105* 95 85    Discharge time spent: greater than 30 minutes.  Signed: Sharen Hones, MD [facility] 02/07/2023

## 2023-02-07 NOTE — Progress Notes (Signed)
Hemodialysis note  Received patient in bed to unit. Alert and oriented x 2.Patient has IV on his Right hand. HD NP notified. Floor RN notified that IV has to be removed/replaced.  Informed consent signed and in chart.  Treatment initiated: 0906 Treatment completed: 1217  Patient tolerated well. Transported back to room, alert without acute distress.  Report given to patient's RN.   Access used: RUA AVF Access issues: none  Total UF removed: 1L Medication(s) given:  none   [place] Constable RN [facility] Unit

## 2023-02-07 NOTE — Progress Notes (Signed)
[facility]  ROUNDING NOTE   Subjective:   [name] is a 62 year old male with past medical conditions including hypertension, type 2 diabetes, Bell's palsy, CVA, CHF, hyperlipidemia, and end-stage renal disease on hemodialysis.  Patient presents to the emergency department complaining of weakness and confusion.  Patient has been admitted under observation for Acute encephalopathy [G93.40] Generalized weakness [R53.1] COVID-19 [U07.1]  Patient is known to our practice and receives outpatient dialysis treatments at [facility] on a MWF schedule, supervised by Dr. Holley Raring.    Patient seen and evaluated during dialysis   HEMODIALYSIS FLOWSHEET:  Blood Flow Rate (mL/min): 350 mL/min Arterial Pressure (mmHg): -150 mmHg Venous Pressure (mmHg): 220 mmHg TMP (mmHg): 8 mmHg Ultrafiltration Rate (mL/min): 420 mL/min Dialysate Flow Rate (mL/min): 300 ml/min  Patient sitting up in bed, receiving dialysis at bedside Breakfast tray at bedside, fairly untouched Patient states he does not feel well, unable to verbalize symptoms  Objective:  Vital signs in last 24 hours:  Temp:  [97.9 F (36.6 C)-98.3 F (36.8 C)] 98.3 F (36.8 C) (04/03 0839) Pulse Rate:  [59-84] 70 (04/03 1130) Resp:  [17-20] 18 (04/03 1130) BP: (112-180)/(56-94) 137/93 (04/03 1130) SpO2:  [97 %-100 %] 100 % (04/03 1130) Weight:  [85.5 kg] 85.5 kg (04/03 0839)  Weight change:  Filed Weights   02/05/23 1945 02/06/23 0540 02/07/23 0839  Weight: 82.9 kg 86.3 kg 85.5 kg    Intake/Output: I/O last 3 completed shifts: In: 480 [P.O.:480] Out: 220 [Urine:220]   Intake/Output this shift:  No intake/output data recorded.  Physical Exam: General: NAD  Head: Normocephalic, atraumatic. Moist oral mucosal membranes  Eyes: Anicteric  Lungs:  Clear to auscultation, normal effort  Heart: Regular rate and rhythm  Abdomen:  Soft, nontender  Extremities: No peripheral edema.  Neurologic: Alert  with delayed response, moving all four extremities  Skin: No lesions  Access: Right aVF    Basic Metabolic Panel: Recent Labs  Lab 02/04/23 1301 02/05/23 0348 02/06/23 0656 02/07/23 0916  NA 134* 134* 135 134*  K 3.8 3.6 3.8 3.9  CL 95* 98 100 102  CO2 28 26 26 23   GLUCOSE 111* 85 89 89  BUN 29* 37* 32* 40*  CREATININE 3.94* 4.08* 3.50* 4.00*  CALCIUM 8.6* 8.8* 8.5* 8.6*     Liver Function Tests: Recent Labs  Lab 02/05/23 0348  AST 20  ALT 13  ALKPHOS 77  BILITOT 0.8  PROT 7.5  ALBUMIN 3.7    No results for input(s): "LIPASE", "AMYLASE" in the last 168 hours. No results for input(s): "AMMONIA" in the last 168 hours.  CBC: Recent Labs  Lab 02/04/23 1301 02/05/23 0348 02/06/23 0656 02/07/23 0916  WBC 5.1 6.4 9.3 7.2  NEUTROABS 3.1  --  7.2 4.5  HGB 10.9* 10.9* 11.5* 10.7*  HCT 32.8* 33.2* 34.1* 31.9*  MCV 95.3 93.5 93.2 92.7  PLT 203 200 214 215     Cardiac Enzymes: No results for input(s): "CKTOTAL", "CKMB", "CKMBINDEX", "TROPONINI" in the last 168 hours.  BNP: Invalid input(s): "POCBNP"  CBG: Recent Labs  Lab 02/06/23 1200 02/06/23 1738 02/06/23 2109 02/07/23 0819 02/07/23 1126  GLUCAP 122* 98 105* 95 83     Microbiology: Results for orders placed or performed during the hospital encounter of 02/04/23  Resp panel by RT-PCR (RSV, Flu A&B, Covid) Anterior Nasal Swab     Status: Abnormal   Collection Time: 02/04/23 12:57 PM   Specimen: Anterior Nasal Swab  Result Value Ref Range  Status   SARS Coronavirus 2 by RT PCR POSITIVE (A) NEGATIVE Final    Comment: (NOTE) SARS-CoV-2 target nucleic acids are DETECTED.  The SARS-CoV-2 RNA is generally detectable in upper respiratory specimens during the acute phase of infection. Positive results are indicative of the presence of the identified virus, but do not rule out bacterial infection or co-infection with other pathogens not detected by the test. Clinical correlation with patient history  and other diagnostic information is necessary to determine patient infection status. The expected result is Negative.  Fact Sheet for Patients: EntrepreneurPulse.com.au  Fact Sheet for Healthcare Providers: IncredibleEmployment.be  This test is not yet approved or cleared by the [place] FDA and  has been authorized for detection and/or diagnosis of SARS-CoV-2 by FDA under an Emergency Use Authorization (EUA).  This EUA will remain in effect (meaning this test can be used) for the duration of  the COVID-19 declaration under Section 564(b)(1) of the A ct, 21 U.S.C. section 360bbb-3(b)(1), unless the authorization is terminated or revoked sooner.     Influenza A by PCR NEGATIVE NEGATIVE Final   Influenza B by PCR NEGATIVE NEGATIVE Final    Comment: (NOTE) The Xpert Xpress SARS-CoV-2/FLU/RSV plus assay is intended as an aid in the diagnosis of influenza from Nasopharyngeal swab specimens and should not be used as a sole basis for treatment. Nasal washings and aspirates are unacceptable for Xpert Xpress SARS-CoV-2/FLU/RSV testing.  Fact Sheet for Patients: EntrepreneurPulse.com.au  Fact Sheet for Healthcare Providers: IncredibleEmployment.be  This test is not yet approved or cleared by the [place] FDA and has been authorized for detection and/or diagnosis of SARS-CoV-2 by FDA under an Emergency Use Authorization (EUA). This EUA will remain in effect (meaning this test can be used) for the duration of the COVID-19 declaration under Section 564(b)(1) of the Act, 21 U.S.C. section 360bbb-3(b)(1), unless the authorization is terminated or revoked.     Resp Syncytial Virus by PCR NEGATIVE NEGATIVE Final    Comment: (NOTE) Fact Sheet for Patients: EntrepreneurPulse.com.au  Fact Sheet for Healthcare Providers: IncredibleEmployment.be  This test is not yet  approved or cleared by the [place] FDA and has been authorized for detection and/or diagnosis of SARS-CoV-2 by FDA under an Emergency Use Authorization (EUA). This EUA will remain in effect (meaning this test can be used) for the duration of the COVID-19 declaration under Section 564(b)(1) of the Act, 21 U.S.C. section 360bbb-3(b)(1), unless the authorization is terminated or revoked.  Performed at [facility], [address] [place], [place] [address]     Coagulation Studies: No results for input(s): "LABPROT", "INR" in the last 72 hours.  Urinalysis: No results for input(s): "COLORURINE", "LABSPEC", "PHURINE", "GLUCOSEU", "HGBUR", "BILIRUBINUR", "KETONESUR", "PROTEINUR", "UROBILINOGEN", "NITRITE", "LEUKOCYTESUR" in the last 72 hours.  Invalid input(s): "APPERANCEUR"    Imaging: No results found.   Medications:    anticoagulant sodium citrate      aspirin EC  81 mg Oral Daily   carvedilol  25 mg Oral BID WC   Chlorhexidine Gluconate Cloth  6 each Topical Q0600   heparin  5,000 Units Subcutaneous Q8H   insulin aspart  0-6 Units Subcutaneous TID WC   multivitamin with minerals  1 tablet Oral Daily   pantoprazole  20 mg Oral Daily   rosuvastatin  10 mg Oral Daily   sertraline  100 mg Oral Daily   sodium chloride flush  3 mL Intravenous Q12H   tamsulosin  0.4 mg Oral Daily   thiamine  100 mg Oral Daily   acetaminophen **OR** acetaminophen, alteplase, anticoagulant sodium citrate, heparin, lidocaine (PF), lidocaine-prilocaine, ondansetron **OR** ondansetron (ZOFRAN) IV, pentafluoroprop-tetrafluoroeth, polyethylene glycol  Assessment/ Plan:  Mr. BARTT HA is a 62 y.o.  male with past medical conditions including hypertension, type 2 diabetes, Bell's palsy, CVA, CHF, hyperlipidemia, and end-stage renal disease on hemodialysis.  Patient presents to the emergency department complaining of weakness and confusion.  Patient has been admitted under  observation for Acute encephalopathy [G93.40] Generalized weakness [R53.1] COVID-19 [U07.1]  [facility]/MWF/right aVF  Hyponatremia with end-stage renal disease on hemodialysis.  Sodium 134 on ED arrival. Sodium corrected with dialysis.  Dialysis today, UF goal reduced from 2 L to 1 L due to poor oral intake and lack of edema.  Next treatment scheduled for Friday.  2. Anemia of chronic kidney disease Lab Results  Component Value Date   HGB 10.7 (L) 02/07/2023     Patient receives Mircera at outpatient clinic.  No need for ESA's at this time.  3. Secondary Hyperparathyroidism: with outpatient labs: None Lab Results  Component Value Date   CALCIUM 8.6 (L) 02/07/2023   CAION 1.19 06/15/2022   PHOS 3.9 01/17/2022   Currently prescribed cholecalciferol outpatient.  Calcium  within desired range.  Awaiting updated phosphorus level.  4.  Hypertension with chronic kidney disease.  Home regimen includes carvedilol and Entresto.  Entresto currently held.  Blood pressure stable during dialysis, 157/56.  LOS: 0   4/3/202411:51 AM

## 2023-02-08 DIAGNOSIS — G9340 Encephalopathy, unspecified: Secondary | ICD-10-CM | POA: Diagnosis not present

## 2023-02-08 DIAGNOSIS — U071 COVID-19: Secondary | ICD-10-CM | POA: Diagnosis not present

## 2023-02-08 DIAGNOSIS — R55 Syncope and collapse: Secondary | ICD-10-CM | POA: Insufficient documentation

## 2023-02-08 DIAGNOSIS — G9341 Metabolic encephalopathy: Secondary | ICD-10-CM

## 2023-02-08 DIAGNOSIS — N186 End stage renal disease: Secondary | ICD-10-CM | POA: Diagnosis not present

## 2023-02-08 DIAGNOSIS — Z992 Dependence on renal dialysis: Secondary | ICD-10-CM | POA: Diagnosis not present

## 2023-02-08 LAB — CBC WITH DIFFERENTIAL/PLATELET
Abs Immature Granulocytes: 0.02 10*3/uL (ref 0.00–0.07)
Basophils Absolute: 0 10*3/uL (ref 0.0–0.1)
Basophils Relative: 0 %
Eosinophils Absolute: 0.2 10*3/uL (ref 0.0–0.5)
Eosinophils Relative: 3 %
HCT: 33.9 % — ABNORMAL LOW (ref 39.0–52.0)
Hemoglobin: 11.1 g/dL — ABNORMAL LOW (ref 13.0–17.0)
Immature Granulocytes: 0 %
Lymphocytes Relative: 27 %
Lymphs Abs: 1.8 10*3/uL (ref 0.7–4.0)
MCH: 30.7 pg (ref 26.0–34.0)
MCHC: 32.7 g/dL (ref 30.0–36.0)
MCV: 93.9 fL (ref 80.0–100.0)
Monocytes Absolute: 0.8 10*3/uL (ref 0.1–1.0)
Monocytes Relative: 12 %
Neutro Abs: 3.9 10*3/uL (ref 1.7–7.7)
Neutrophils Relative %: 58 %
Platelets: 229 10*3/uL (ref 150–400)
RBC: 3.61 MIL/uL — ABNORMAL LOW (ref 4.22–5.81)
RDW: 12.9 % (ref 11.5–15.5)
WBC: 6.7 10*3/uL (ref 4.0–10.5)
nRBC: 0 % (ref 0.0–0.2)

## 2023-02-08 LAB — GLUCOSE, CAPILLARY: Glucose-Capillary: 75 mg/dL (ref 70–99)

## 2023-02-08 LAB — BASIC METABOLIC PANEL
Anion gap: 10 (ref 5–15)
BUN: 31 mg/dL — ABNORMAL HIGH (ref 8–23)
CO2: 29 mmol/L (ref 22–32)
Calcium: 9.1 mg/dL (ref 8.9–10.3)
Chloride: 98 mmol/L (ref 98–111)
Creatinine, Ser: 3.33 mg/dL — ABNORMAL HIGH (ref 0.61–1.24)
GFR, Estimated: 20 mL/min — ABNORMAL LOW (ref >60)
Glucose, Bld: 85 mg/dL (ref 70–99)
Potassium: 4.7 mmol/L (ref 3.5–5.1)
Sodium: 137 mmol/L (ref 135–145)

## 2023-02-08 NOTE — Progress Notes (Signed)
Physical Therapy Treatment Patient Details Name: Randall [name] MRN: [id number] DOB: [birth year] Today's Date: 02/08/2023   History of Present Illness Randall [name] is a 62 y/o M admitted on 02/04/23 after presenting to the ED with c/o generalized weakness & AMS. Pt is being treated for acute encephalopathy 2/2 COVID-19. PMH: dementia, ESRD on HD, HFpEF, HTN, HLD, DM2, CVA, bell's palsy, anxiety, arthritis, CHF, heart murmur, stroke.    PT Comments    Pt in chair on entry finishing breakfast. Author rushed to room for PT treatment after having heard that pt was alert and awake. Pt interactive and calm, agreeable to session. Pt appears weak with both transfers and AMB short in-room distances. He is able to rise to standing with verbal cues and standby assist for safety, walking demonstrates some apparent dragging of RLE at times, which possible contributed to an episode of forward LOB and shuffling that required author to step in to prevent fall to floor. Pt unable to walk much farther than 1ft due to extreme fatigue, but can repeat this after a short seated recovery interval. Will continue to follow.      Recommendations for follow up therapy are one component of a multi-disciplinary discharge planning process, led by the attending physician.  Recommendations may be updated based on patient status, additional functional criteria and insurance authorization.  Follow Up Recommendations       Assistance Recommended at Discharge Frequent or constant Supervision/Assistance  Patient can return home with the following A little help with walking and/or transfers;A little help with bathing/dressing/bathroom;Assistance with cooking/housework;Assist for transportation;Help with stairs or ramp for entrance   Equipment Recommendations  None recommended by PT    Recommendations for Other Services       Precautions / Restrictions       Mobility  Bed Mobility               General bed  mobility comments: already in chair on entry    Transfers Overall transfer level: Needs assistance Equipment used: Rolling walker (2 wheels) Transfers: Sit to/from Stand Sit to Stand: Min guard           General transfer comment: labored, difficult, appears balanced, cues for hand placement    Ambulation/Gait Ambulation/Gait assistance: Min guard, Min assist Gait Distance (Feet): 50 Feet (twice with rest break between) Assistive device: Rolling walker (2 wheels)         General Gait Details: dragging Rt leg on first walk, appears improved on 2nd walk   Stairs             Wheelchair Mobility    Modified Rankin (Stroke Patients Only)       Balance                                            Cognition Arousal/Alertness: Awake/alert Behavior During Therapy: WFL for tasks assessed/performed Overall Cognitive Status: History of cognitive impairments - at baseline                                          Exercises      General Comments        Pertinent Vitals/Pain Pain Assessment Pain Assessment: No/denies pain    Home Living  Prior Function            PT Goals (current goals can now be found in the care plan section) Acute Rehab PT Goals Patient Stated Goal: get better PT Goal Formulation: With patient/family Time For Goal Achievement: 02/19/23 Potential to Achieve Goals: Fair Progress towards PT goals: Progressing toward goals;Not progressing toward goals - comment    Frequency    Min 2X/week      PT Plan Current plan remains appropriate    Co-evaluation              AM-PAC PT "6 Clicks" Mobility   Outcome Measure  Help needed turning from your back to your side while in a flat bed without using bedrails?: None Help needed moving from lying on your back to sitting on the side of a flat bed without using bedrails?: A Little Help needed moving to and from  a bed to a chair (including a wheelchair)?: A Little Help needed standing up from a chair using your arms (e.g., wheelchair or bedside chair)?: A Little Help needed to walk in hospital room?: A Little Help needed climbing 3-5 steps with a railing? : A Little 6 Click Score: 19    End of Session Equipment Utilized During Treatment: Gait belt Activity Tolerance: Patient tolerated treatment well;Patient limited by fatigue Patient left: in chair;with chair alarm set;with call bell/phone within reach Nurse Communication: Mobility status PT Visit Diagnosis: Muscle weakness (generalized) (M62.81);Unsteadiness on feet (R26.81);Difficulty in walking, not elsewhere classified (R26.2);Other symptoms and signs involving the nervous system (R29.898)     Time: [id number] PT Time Calculation (min) (ACUTE ONLY): 16 min  Charges:  $Therapeutic Activity: 8-22 mins                    11:49 AM, 02/08/23 Etta Grandchild, PT, DPT Physical Therapist - [facility]  [phone] ([place])    [place] C 02/08/2023, 11:46 AM

## 2023-02-08 NOTE — Discharge Summary (Signed)
Physician Discharge Summary   Patient: Randall [name] MRN: [id number] DOB: Nov 03, 1961  Admit date:     02/04/2023  Discharge date: 02/08/23  Discharge Physician: Sharen Hones   PCP: Thayer Headings, MD   Recommendations at discharge:   Follow-up with PCP in 1 week.  Discharge Diagnoses: Principal Problem:   Acute encephalopathy Active Problems:   COVID-19 virus infection   Weakness on left side of face   Dementia with behavioral disturbance   Acute metabolic encephalopathy   Chronic systolic CHF (congestive heart failure)   History of CVA (cerebrovascular accident)   Essential hypertension   ESRD on dialysis   Type 2 diabetes mellitus   Hyponatremia   Syncope and collapse  Resolved Problems:   * No resolved hospital problems. *  Hospital Course: Randall [name] is a 62 y.o. male with medical history significant of dementia, ESRD on HD, HFpEF, hypertension, hyperlipidemia, type 2 diabetes, CVA, Bell's palsy who is presents to the ED due to generalized weakness and altered mental status.  History obtained from patient's sister at bedside with whom the patient lives. On arrival to the ED, patient was hypertensive at 150/69 with heart rate of 60.  He was saturating at 100% on room air.  He was afebrile at 98.4. Initial workup notable for COVID-19 PCR positive, WBC of 5.1, hemoglobin 10.9, sodium 134, chloride 95, glucose 111, BUN 29, creatinine 3.94 with GFR 17.  Troponin elevated at 45 with flat trend to 45.  Chest x-ray was obtained that did not show any acute cardiopulmonary disease.   Patient was monitored in the hospital for 2 days, mental status had improved to baseline.    Patient had short episode of unresponsiveness, appeared to be secondary to syncope.  He had dialysis, removed 1 L of fluids, he was sitting in the chair before transport.  This appears to be a vasovagal reaction due to dialysis.  Patient doing better today, medically stable to be discharged.  Assessment  and Plan: Syncope secondary to vasovagal reaction. Patient doing better today, not suspecting any cardiac cause.  Acute metabolic encephalopathy likely secondary to COVID-19 infection Patient is presenting with increased generalized weakness, increased disorientation, likely in the setting of underlying infection with COVID-19.   History of dementia is likely exacerbating symptoms. MRI of the brain did not show acute intracranial pathology Patient mental status has improved, medically stable to be discharged.   COVID-19 virus infection Patient did not develop any hypoxemia.  No pneumonia.  Still need to quarantine at home.   Weakness on left side of face MRI of the brain did not show any new stroke.     ESRD on dialysis [facility]) Follow-up with nephrology for continued hemodialysis.   Essential hypertension - Home medication resumed   Chronic systolic CHF (congestive heart failure) ([place]) Patient is euvolemic.   [facility]) PCP as outpatient.      Consultants: Nephrology Procedures performed: None  Disposition: Home health Diet recommendation:  Discharge Diet Orders (From admission, onward)     Start     Ordered   02/07/23 0000  Diet - low sodium heart healthy        02/07/23 1145           Renal diet DISCHARGE MEDICATION: Allergies as of 02/08/2023       Reactions   Penicillins    Reaction in childhood        Medication List     STOP taking these medications    pantoprazole  40 MG tablet Commonly known as: PROTONIX   sacubitril-valsartan 97-103 MG Commonly known as: ENTRESTO       TAKE these medications    aspirin EC 81 MG tablet Take 81 mg by mouth daily.   carvedilol 25 MG tablet Commonly known as: COREG Take 25 mg by mouth 2 (two) times daily with a meal.   cholecalciferol 25 MCG (1000 UNIT) tablet Commonly known as: VITAMIN D3 Take 1,000 Units by mouth daily.   cyanocobalamin 1000 MCG tablet Take 1,000 mcg by mouth daily.    lansoprazole 30 MG capsule Commonly known as: PREVACID Take 30 mg by mouth daily.   lidocaine-prilocaine cream Commonly known as: EMLA Apply 1 Application topically once.   rosuvastatin 10 MG tablet Commonly known as: CRESTOR Take 10 mg by mouth daily.   sertraline 100 MG tablet Commonly known as: ZOLOFT Take 100 mg by mouth daily.   tamsulosin 0.4 MG Caps capsule Commonly known as: FLOMAX Take 0.4 mg by mouth daily.   thiamine 100 MG tablet Commonly known as: VITAMIN B1 Take 1 tablet by mouth daily.               Durable Medical Equipment  (From admission, onward)           Start     Ordered   02/05/23 1309  For home use only DME lightweight manual wheelchair with seat cushion  Once       Comments: Patient suffers from dementia which impairs their ability to perform daily activities like dressing and toileting in the home.  A walker will not resolve  issue with performing activities of daily living. A wheelchair will allow patient to safely perform daily activities. Patient is not able to propel themselves in the home using a standard weight wheelchair due to general weakness. Patient can self propel in the lightweight wheelchair. Length of need Lifetime. Accessories: elevating leg rests (ELRs), wheel locks, extensions and anti-tippers.   02/05/23 1308              Discharge Care Instructions  (From admission, onward)           Start     Ordered   02/07/23 0000  Discharge wound care:       Comments: none   02/07/23 1145            Follow-up Information     Thayer Headings, MD. Go on 02/15/2023.   Specialty: Internal Medicine Why: You will have a appointment February 15, 2023 at 10:30am. Address is [address] [place], [place] [address]. Number [phone] Contact information: [place] [address] [phone]                Discharge Exam: Filed Weights   02/06/23 0540 02/07/23 0839 02/07/23 1247  Weight: 86.3  kg 85.5 kg 85 kg   General exam: Appears calm and comfortable  Respiratory system: Clear to auscultation. Respiratory effort normal. Cardiovascular system: S1 & S2 heard, RRR. No JVD, murmurs, rubs, gallops or clicks. No pedal edema. Gastrointestinal system: Abdomen is nondistended, soft and nontender. No organomegaly or masses felt. Normal bowel sounds heard. Central nervous system: Alert and oriented x2. No focal neurological deficits. Extremities: Symmetric 5 x 5 power. Skin: No rashes, lesions or ulcers Psychiatry: Judgement and insight appear normal. Mood & affect appropriate.    Condition at discharge: good  The results of significant diagnostics from this hospitalization (including imaging, microbiology, ancillary and laboratory) are listed below for  reference.   Imaging Studies: MR BRAIN WO CONTRAST  Result Date: 02/04/2023 CLINICAL DATA:  Neuro deficit, acute, stroke suspected. Weakness. Altered mental status. EXAM: MRI HEAD WITHOUT CONTRAST TECHNIQUE: Multiplanar, multiecho pulse sequences of the brain and surrounding structures were obtained without intravenous contrast. COMPARISON:  01/15/2023 FINDINGS: Brain: Diffusion imaging does not show any acute or subacute infarction or other cause of restricted diffusion. Extensive chronic small-vessel ischemic changes are seen throughout the pons. Old lacunar infarction in the right pons. Few old small vessel cerebellar infarctions. Old small vessel infarctions of the thalami. Advanced and confluent chronic small vessel ischemic changes throughout the cerebral hemispheric white matter. No cortical or large vessel territory infarction. No mass lesion, hydrocephalus or extra-axial collection. Hemosiderin deposition throughout the brain associated with many of the old small vessel insults. Findings suggest chronic hypertensive encephalopathy. More extensive area of hemosiderin deposition in the region of the white matter posterior to the atrium  of the right lateral ventricle. No evidence of acute hemorrhage. Vascular: Major vessels at the base of the brain show flow. Skull and upper cervical spine: Negative Sinuses/Orbits: Clear/normal Other: None IMPRESSION: No acute finding. Extensive chronic small-vessel ischemic changes throughout the brain as outlined above. Hemosiderin deposition throughout the brain in association with the old infarctions consistent with chronic hypertensive encephalopathy. Electronically Signed   By: Nelson Chimes M.D.   On: 02/04/2023 20:10   DG Chest Port 1 View  Result Date: 02/04/2023 CLINICAL DATA:  Cough EXAM: PORTABLE CHEST 1 VIEW COMPARISON:  CXR 03/17/23 FINDINGS: No pleural effusion. No pneumothorax. Prominent cardiac contours, unchanged from prior. Unchanged mediastinal contours. No focal airspace opacity. No radiographically apparent displaced rib fractures. Visualized upper abdomen is unremarkable. Degenerative changes of the bilateral AC joints. IMPRESSION: No focal airspace opacity.  No pulmonary edema.  No pleural effusion Electronically Signed   By: Marin Roberts M.D.   On: 02/04/2023 13:34   CT Head Wo Contrast  Result Date: 01/15/2023 CLINICAL DATA:  General malaise EXAM: CT HEAD WITHOUT CONTRAST TECHNIQUE: Contiguous axial images were obtained from the base of the skull through the vertex without intravenous contrast. RADIATION DOSE REDUCTION: This exam was performed according to the departmental dose-optimization program which includes automated exposure control, adjustment of the mA and/or kV according to patient size and/or use of iterative reconstruction technique. COMPARISON:  06/21/2022 FINDINGS: Brain: No evidence of acute infarction, hemorrhage, mass, mass effect, or midline shift. No hydrocephalus or extra-axial fluid collection. Periventricular white matter changes, likely the sequela of chronic small vessel ischemic disease. Advanced cerebral atrophy for age. Vascular: No hyperdense vessel.  Skull: Negative for fracture or focal lesion. Sinuses/Orbits: No acute finding. Other: The mastoid air cells are well aerated. IMPRESSION: No acute intracranial process. Electronically Signed   By: Merilyn Baba M.D.   On: 01/15/2023 19:22   DG Chest 2 View  Result Date: 01/15/2023 CLINICAL DATA:  Weakness, hypertension EXAM: CHEST - 2 VIEW COMPARISON:  12/09/2021 FINDINGS: The heart size and mediastinal contours are within normal limits. Both lungs are clear. The visualized skeletal structures are unremarkable. IMPRESSION: No active cardiopulmonary disease. Electronically Signed   By: Randa Ngo M.D.   On: 01/15/2023 18:08    Microbiology: Results for orders placed or performed during the hospital encounter of 02/04/23  Resp panel by RT-PCR (RSV, Flu A&B, Covid) Anterior Nasal Swab     Status: Abnormal   Collection Time: 02/04/23 12:57 PM   Specimen: Anterior Nasal Swab  Result Value Ref Range Status  SARS Coronavirus 2 by RT PCR POSITIVE (A) NEGATIVE Final    Comment: (NOTE) SARS-CoV-2 target nucleic acids are DETECTED.  The SARS-CoV-2 RNA is generally detectable in upper respiratory specimens during the acute phase of infection. Positive results are indicative of the presence of the identified virus, but do not rule out bacterial infection or co-infection with other pathogens not detected by the test. Clinical correlation with patient history and other diagnostic information is necessary to determine patient infection status. The expected result is Negative.  Fact Sheet for Patients: EntrepreneurPulse.com.au  Fact Sheet for Healthcare Providers: IncredibleEmployment.be  This test is not yet approved or cleared by the [place] FDA and  has been authorized for detection and/or diagnosis of SARS-CoV-2 by FDA under an Emergency Use Authorization (EUA).  This EUA will remain in effect (meaning this test can be used) for the duration of  the  COVID-19 declaration under Section 564(b)(1) of the A ct, 21 U.S.C. section 360bbb-3(b)(1), unless the authorization is terminated or revoked sooner.     Influenza A by PCR NEGATIVE NEGATIVE Final   Influenza B by PCR NEGATIVE NEGATIVE Final    Comment: (NOTE) The Xpert Xpress SARS-CoV-2/FLU/RSV plus assay is intended as an aid in the diagnosis of influenza from Nasopharyngeal swab specimens and should not be used as a sole basis for treatment. Nasal washings and aspirates are unacceptable for Xpert Xpress SARS-CoV-2/FLU/RSV testing.  Fact Sheet for Patients: EntrepreneurPulse.com.au  Fact Sheet for Healthcare Providers: IncredibleEmployment.be  This test is not yet approved or cleared by the [place] FDA and has been authorized for detection and/or diagnosis of SARS-CoV-2 by FDA under an Emergency Use Authorization (EUA). This EUA will remain in effect (meaning this test can be used) for the duration of the COVID-19 declaration under Section 564(b)(1) of the Act, 21 U.S.C. section 360bbb-3(b)(1), unless the authorization is terminated or revoked.     Resp Syncytial Virus by PCR NEGATIVE NEGATIVE Final    Comment: (NOTE) Fact Sheet for Patients: EntrepreneurPulse.com.au  Fact Sheet for Healthcare Providers: IncredibleEmployment.be  This test is not yet approved or cleared by the [place] FDA and has been authorized for detection and/or diagnosis of SARS-CoV-2 by FDA under an Emergency Use Authorization (EUA). This EUA will remain in effect (meaning this test can be used) for the duration of the COVID-19 declaration under Section 564(b)(1) of the Act, 21 U.S.C. section 360bbb-3(b)(1), unless the authorization is terminated or revoked.  Performed at [facility], [address] [place], [place] [address]     Labs: CBC: Recent Labs  Lab 02/04/23 1301 02/05/23 0348  02/06/23 0656 02/07/23 0916 02/08/23 0433  WBC 5.1 6.4 9.3 7.2 6.7  NEUTROABS 3.1  --  7.2 4.5 3.9  HGB 10.9* 10.9* 11.5* 10.7* 11.1*  HCT 32.8* 33.2* 34.1* 31.9* 33.9*  MCV 95.3 93.5 93.2 92.7 93.9  PLT 203 200 214 215 [id number]   Basic Metabolic Panel: Recent Labs  Lab 02/04/23 1301 02/05/23 0348 02/06/23 0656 02/07/23 0916 02/08/23 0433  NA 134* 134* 135 134* 137  K 3.8 3.6 3.8 3.9 4.7  CL 95* 98 100 102 98  CO2 28 26 26 23 29   GLUCOSE 111* 85 89 89 85  BUN 29* 37* 32* 40* 31*  CREATININE 3.94* 4.08* 3.50* 4.00* 3.33*  CALCIUM 8.6* 8.8* 8.5* 8.6* 9.1  PHOS  --   --   --  3.8  --    Liver Function Tests: Recent Labs  Lab 02/05/23 0348  AST  20  ALT 13  ALKPHOS 77  BILITOT 0.8  PROT 7.5  ALBUMIN 3.7   CBG: Recent Labs  Lab 02/07/23 1126 02/07/23 1602 02/07/23 1653 02/07/23 1958 02/08/23 0938  GLUCAP 85 84 80 167* 75    Discharge time spent: less than 30 minutes.  Signed: Sharen Hones, MD [facility] 02/08/2023

## 2023-02-08 NOTE — Progress Notes (Signed)
[facility]  ROUNDING NOTE   Subjective:   Randall [name] is a 62 year old male with past medical conditions including hypertension, type 2 diabetes, Bell's palsy, CVA, CHF, hyperlipidemia, and end-stage renal disease on hemodialysis.  Patient presents to the emergency department complaining of weakness and confusion.  Patient has been admitted under observation for Acute encephalopathy [G93.40] Generalized weakness [R53.1] COVID-19 [U07.1]  Patient is known to our practice and receives outpatient dialysis treatments at [facility] on a MWF schedule, supervised by Dr. Holley Raring.    Hemodialysis treatment yesterday. Tolerated treatment well.   Objective:  Vital signs in last 24 hours:  Temp:  [98 F (36.7 C)-98.9 F (37.2 C)] 98.9 F (37.2 C) (04/04 0837) Pulse Rate:  [72-73] 73 (04/04 0837) Resp:  [16] 16 (04/04 0837) BP: (118-169)/(69-76) 169/76 (04/04 0837) SpO2:  [100 %] 100 % (04/04 0837)  Weight change:  Filed Weights   02/06/23 0540 02/07/23 0839 02/07/23 1247  Weight: 86.3 kg 85.5 kg 85 kg    Intake/Output: I/O last 3 completed shifts: In: 720 [P.O.:720] Out: [id number] [Urine:220; Other:1000]   Intake/Output this shift:  No intake/output data recorded.  Physical Exam: General: NAD  Head: Normocephalic, atraumatic. Moist oral mucosal membranes  Eyes: Anicteric  Lungs:  Clear to auscultation, normal effort  Heart: Regular rate and rhythm  Abdomen:  Soft, nontender  Extremities: No peripheral edema.  Neurologic: Alert with delayed response, moving all four extremities  Skin: No lesions  Access: Right aVF    Basic Metabolic Panel: Recent Labs  Lab 02/04/23 1301 02/05/23 0348 02/06/23 0656 02/07/23 0916 02/08/23 0433  NA 134* 134* 135 134* 137  K 3.8 3.6 3.8 3.9 4.7  CL 95* 98 100 102 98  CO2 28 26 26 23 29   GLUCOSE 111* 85 89 89 85  BUN 29* 37* 32* 40* 31*  CREATININE 3.94* 4.08* 3.50* 4.00* 3.33*  CALCIUM 8.6* 8.8* 8.5* 8.6* 9.1   PHOS  --   --   --  3.8  --      Liver Function Tests: Recent Labs  Lab 02/05/23 0348  AST 20  ALT 13  ALKPHOS 77  BILITOT 0.8  PROT 7.5  ALBUMIN 3.7    No results for input(s): "LIPASE", "AMYLASE" in the last 168 hours. No results for input(s): "AMMONIA" in the last 168 hours.  CBC: Recent Labs  Lab 02/04/23 1301 02/05/23 0348 02/06/23 0656 02/07/23 0916 02/08/23 0433  WBC 5.1 6.4 9.3 7.2 6.7  NEUTROABS 3.1  --  7.2 4.5 3.9  HGB 10.9* 10.9* 11.5* 10.7* 11.1*  HCT 32.8* 33.2* 34.1* 31.9* 33.9*  MCV 95.3 93.5 93.2 92.7 93.9  PLT 203 200 214 215 229     Cardiac Enzymes: No results for input(s): "CKTOTAL", "CKMB", "CKMBINDEX", "TROPONINI" in the last 168 hours.  BNP: Invalid input(s): "POCBNP"  CBG: Recent Labs  Lab 02/07/23 1126 02/07/23 1602 02/07/23 1653 02/07/23 1958 02/08/23 0938  GLUCAP 85 84 80 167* 18     Microbiology: Results for orders placed or performed during the hospital encounter of 02/04/23  Resp panel by RT-PCR (RSV, Flu A&B, Covid) Anterior Nasal Swab     Status: Abnormal   Collection Time: 02/04/23 12:57 PM   Specimen: Anterior Nasal Swab  Result Value Ref Range Status   SARS Coronavirus 2 by RT PCR POSITIVE (A) NEGATIVE Final    Comment: (NOTE) SARS-CoV-2 target nucleic acids are DETECTED.  The SARS-CoV-2 RNA is generally detectable in upper respiratory specimens during the  acute phase of infection. Positive results are indicative of the presence of the identified virus, but do not rule out bacterial infection or co-infection with other pathogens not detected by the test. Clinical correlation with patient history and other diagnostic information is necessary to determine patient infection status. The expected result is Negative.  Fact Sheet for Patients: EntrepreneurPulse.com.au  Fact Sheet for Healthcare Providers: IncredibleEmployment.be  This test is not yet approved or cleared by  the [place] FDA and  has been authorized for detection and/or diagnosis of SARS-CoV-2 by FDA under an Emergency Use Authorization (EUA).  This EUA will remain in effect (meaning this test can be used) for the duration of  the COVID-19 declaration under Section 564(b)(1) of the A ct, 21 U.S.C. section 360bbb-3(b)(1), unless the authorization is terminated or revoked sooner.     Influenza A by PCR NEGATIVE NEGATIVE Final   Influenza B by PCR NEGATIVE NEGATIVE Final    Comment: (NOTE) The Xpert Xpress SARS-CoV-2/FLU/RSV plus assay is intended as an aid in the diagnosis of influenza from Nasopharyngeal swab specimens and should not be used as a sole basis for treatment. Nasal washings and aspirates are unacceptable for Xpert Xpress SARS-CoV-2/FLU/RSV testing.  Fact Sheet for Patients: EntrepreneurPulse.com.au  Fact Sheet for Healthcare Providers: IncredibleEmployment.be  This test is not yet approved or cleared by the [place] FDA and has been authorized for detection and/or diagnosis of SARS-CoV-2 by FDA under an Emergency Use Authorization (EUA). This EUA will remain in effect (meaning this test can be used) for the duration of the COVID-19 declaration under Section 564(b)(1) of the Act, 21 U.S.C. section 360bbb-3(b)(1), unless the authorization is terminated or revoked.     Resp Syncytial Virus by PCR NEGATIVE NEGATIVE Final    Comment: (NOTE) Fact Sheet for Patients: EntrepreneurPulse.com.au  Fact Sheet for Healthcare Providers: IncredibleEmployment.be  This test is not yet approved or cleared by the [place] FDA and has been authorized for detection and/or diagnosis of SARS-CoV-2 by FDA under an Emergency Use Authorization (EUA). This EUA will remain in effect (meaning this test can be used) for the duration of the COVID-19 declaration under Section 564(b)(1) of the Act, 21  U.S.C. section 360bbb-3(b)(1), unless the authorization is terminated or revoked.  Performed at [facility], [address] [place], [place] [address]     Coagulation Studies: No results for input(s): "LABPROT", "INR" in the last 72 hours.  Urinalysis: No results for input(s): "COLORURINE", "LABSPEC", "PHURINE", "GLUCOSEU", "HGBUR", "BILIRUBINUR", "KETONESUR", "PROTEINUR", "UROBILINOGEN", "NITRITE", "LEUKOCYTESUR" in the last 72 hours.  Invalid input(s): "APPERANCEUR"    Imaging: No results found.   Medications:    anticoagulant sodium citrate      aspirin EC  81 mg Oral Daily   carvedilol  25 mg Oral BID WC   Chlorhexidine Gluconate Cloth  6 each Topical Q0600   heparin  5,000 Units Subcutaneous Q8H   insulin aspart  0-6 Units Subcutaneous TID WC   multivitamin with minerals  1 tablet Oral Daily   pantoprazole  20 mg Oral Daily   rosuvastatin  10 mg Oral Daily   sertraline  100 mg Oral Daily   sodium chloride flush  3 mL Intravenous Q12H   tamsulosin  0.4 mg Oral Daily   thiamine  100 mg Oral Daily   acetaminophen **OR** acetaminophen, alteplase, anticoagulant sodium citrate, heparin, lidocaine (PF), lidocaine-prilocaine, ondansetron **OR** ondansetron (ZOFRAN) IV, pentafluoroprop-tetrafluoroeth, polyethylene glycol  Assessment/ Plan:  Randall [name] is a 62 y.o.  male with past medical conditions including hypertension, type 2 diabetes, Bell's palsy, CVA, CHF, hyperlipidemia, and end-stage renal disease on hemodialysis.  Patient presents to the emergency department complaining of weakness and confusion.  Patient has been admitted under observation for Acute encephalopathy [G93.40] Generalized weakness [R53.1] COVID-19 [U07.1]  [facility] [place]/MWF/right aVF  Hyponatremia with end-stage renal disease on hemodialysis.  Sodium 134 on ED arrival. Sodium corrected with dialysis.  Next treatment scheduled for Friday.  2. Anemia of chronic kidney  disease Lab Results  Component Value Date   HGB 11.1 (L) 02/08/2023     Patient receives Mircera at outpatient clinic.  No need for ESA's at this time.  3. Secondary Hyperparathyroidism: with outpatient labs: None Lab Results  Component Value Date   CALCIUM 9.1 02/08/2023   CAION 1.19 06/15/2022   PHOS 3.8 02/07/2023   Currently prescribed cholecalciferol outpatient.  Calcium and phosphorus within desired range.     4.  Hypertension with chronic kidney disease.  Home regimen includes carvedilol and Entresto.  Entresto currently held.  Blood pressure stable during dialysis.  LOS: 0 Keylah Darwish 4/4/20242:34 PM

## 2023-02-08 NOTE — Progress Notes (Signed)
Mobility Specialist - Progress Note   02/08/23 0940  Mobility  Activity Stood at bedside  Level of Assistance Minimal assist, patient does 75% or more  Assistive Device Front wheel walker  Activity Response Tolerated well  $Mobility charge 1 Mobility   Pt sitting in the recliner upon entry, utilizing RA. Pt more alert and responsive this date. Pt agreeable to standing and changing gown. Pt STS to RW MinA-SBA, tolerated well. Pt stood at the recliner for 1~2 mins while MS assisted in donning grown. Pt returned seated with needs within reach. NT and MD present upon exit.   Candie Mile Mobility Specialist 02/08/23 9:50 AM

## 2023-04-03 ENCOUNTER — Ambulatory Visit (INDEPENDENT_AMBULATORY_CARE_PROVIDER_SITE_OTHER): Payer: Medicare HMO | Admitting: Podiatry

## 2023-04-03 DIAGNOSIS — B351 Tinea unguium: Secondary | ICD-10-CM | POA: Diagnosis not present

## 2023-04-03 DIAGNOSIS — M79674 Pain in right toe(s): Secondary | ICD-10-CM | POA: Diagnosis not present

## 2023-04-03 DIAGNOSIS — M79675 Pain in left toe(s): Secondary | ICD-10-CM

## 2023-04-03 NOTE — Progress Notes (Signed)
   SUBJECTIVE Patient presents to office today complaining of elongated, thickened nails that cause pain while ambulating in shoes.  Patient is unable to trim their own nails. Patient is here for further evaluation and treatment.  Past Medical History:  Diagnosis Date   Anemia    Anxiety    Arthritis    CHF (congestive heart failure) (HCC)    Chronic kidney disease    Dementia (HCC)    Depression    Diabetes mellitus without complication (HCC)    GERD (gastroesophageal reflux disease)    Heart murmur    Hypertension    Hypertensive urgency 12/09/2021   Pneumonia    Stroke (HCC)     OBJECTIVE General Patient is awake, alert, and oriented x 3 and in no acute distress. Derm Skin is dry and supple bilateral. Negative open lesions or macerations. Remaining integument unremarkable. Nails are tender, long, thickened and dystrophic with subungual debris, consistent with onychomycosis, 1-5 bilateral. No signs of infection noted. Vasc  DP and PT pedal pulses palpable bilaterally. Temperature gradient within normal limits.  Neuro Epicritic and protective threshold sensation grossly intact bilaterally.  Musculoskeletal Exam No symptomatic pedal deformities noted bilateral. Muscular strength within normal limits.  ASSESSMENT 1.  Pain due to onychomycosis of toenails both  PLAN OF CARE 1. Patient evaluated today.  2. Instructed to maintain good pedal hygiene and foot care.  3. Mechanical debridement of nails 1-5 bilaterally performed using a nail nipper. Filed with dremel without incident.  4. Return to clinic in 3 mos.    Joelly Bolanos M. Sutter Ahlgren, DPM [facility]  Dr. Ethel Veronica M. Clancy Leiner, DPM    [address]                                     , [place] [address]                Office [phone]  Fax [phone]     

## 2023-06-30 ENCOUNTER — Emergency Department: Payer: Medicare HMO

## 2023-06-30 ENCOUNTER — Inpatient Hospital Stay
Admission: EM | Admit: 2023-06-30 | Discharge: 2023-07-08 | DRG: 871 | Disposition: A | Discharge status: Home Health | Payer: Medicare HMO | Attending: Hospitalist | Admitting: Hospitalist

## 2023-06-30 ENCOUNTER — Other Ambulatory Visit: Payer: Self-pay

## 2023-06-30 DIAGNOSIS — Z992 Dependence on renal dialysis: Secondary | ICD-10-CM

## 2023-06-30 DIAGNOSIS — G9340 Encephalopathy, unspecified: Secondary | ICD-10-CM | POA: Diagnosis present

## 2023-06-30 DIAGNOSIS — Z5986 Financial insecurity: Secondary | ICD-10-CM

## 2023-06-30 DIAGNOSIS — A4181 Sepsis due to Enterococcus: Principal | ICD-10-CM | POA: Diagnosis present

## 2023-06-30 DIAGNOSIS — I959 Hypotension, unspecified: Secondary | ICD-10-CM | POA: Diagnosis not present

## 2023-06-30 DIAGNOSIS — Z1152 Encounter for screening for COVID-19: Secondary | ICD-10-CM

## 2023-06-30 DIAGNOSIS — I132 Hypertensive heart and chronic kidney disease with heart failure and with stage 5 chronic kidney disease, or end stage renal disease: Secondary | ICD-10-CM | POA: Diagnosis present

## 2023-06-30 DIAGNOSIS — F32A Depression, unspecified: Secondary | ICD-10-CM | POA: Diagnosis present

## 2023-06-30 DIAGNOSIS — Z7982 Long term (current) use of aspirin: Secondary | ICD-10-CM

## 2023-06-30 DIAGNOSIS — R4182 Altered mental status, unspecified: Principal | ICD-10-CM

## 2023-06-30 DIAGNOSIS — J449 Chronic obstructive pulmonary disease, unspecified: Secondary | ICD-10-CM | POA: Diagnosis present

## 2023-06-30 DIAGNOSIS — N390 Urinary tract infection, site not specified: Secondary | ICD-10-CM | POA: Diagnosis present

## 2023-06-30 DIAGNOSIS — Z8673 Personal history of transient ischemic attack (TIA), and cerebral infarction without residual deficits: Secondary | ICD-10-CM

## 2023-06-30 DIAGNOSIS — Z79899 Other long term (current) drug therapy: Secondary | ICD-10-CM

## 2023-06-30 DIAGNOSIS — R7881 Bacteremia: Secondary | ICD-10-CM

## 2023-06-30 DIAGNOSIS — I2510 Atherosclerotic heart disease of native coronary artery without angina pectoris: Secondary | ICD-10-CM | POA: Diagnosis present

## 2023-06-30 DIAGNOSIS — R6520 Severe sepsis without septic shock: Secondary | ICD-10-CM | POA: Diagnosis present

## 2023-06-30 DIAGNOSIS — B952 Enterococcus as the cause of diseases classified elsewhere: Secondary | ICD-10-CM

## 2023-06-30 DIAGNOSIS — N3001 Acute cystitis with hematuria: Secondary | ICD-10-CM | POA: Diagnosis present

## 2023-06-30 DIAGNOSIS — N400 Enlarged prostate without lower urinary tract symptoms: Secondary | ICD-10-CM | POA: Diagnosis present

## 2023-06-30 DIAGNOSIS — Z66 Do not resuscitate: Secondary | ICD-10-CM | POA: Diagnosis present

## 2023-06-30 DIAGNOSIS — F419 Anxiety disorder, unspecified: Secondary | ICD-10-CM | POA: Diagnosis present

## 2023-06-30 DIAGNOSIS — N186 End stage renal disease: Secondary | ICD-10-CM

## 2023-06-30 DIAGNOSIS — Z6830 Body mass index (BMI) 30.0-30.9, adult: Secondary | ICD-10-CM

## 2023-06-30 DIAGNOSIS — E872 Acidosis, unspecified: Secondary | ICD-10-CM | POA: Diagnosis present

## 2023-06-30 DIAGNOSIS — Z880 Allergy status to penicillin: Secondary | ICD-10-CM

## 2023-06-30 DIAGNOSIS — Z823 Family history of stroke: Secondary | ICD-10-CM

## 2023-06-30 DIAGNOSIS — N2581 Secondary hyperparathyroidism of renal origin: Secondary | ICD-10-CM | POA: Diagnosis present

## 2023-06-30 DIAGNOSIS — F0390 Unspecified dementia without behavioral disturbance: Secondary | ICD-10-CM | POA: Diagnosis present

## 2023-06-30 DIAGNOSIS — G9341 Metabolic encephalopathy: Secondary | ICD-10-CM | POA: Diagnosis present

## 2023-06-30 DIAGNOSIS — Z833 Family history of diabetes mellitus: Secondary | ICD-10-CM

## 2023-06-30 DIAGNOSIS — E785 Hyperlipidemia, unspecified: Secondary | ICD-10-CM | POA: Diagnosis present

## 2023-06-30 DIAGNOSIS — E669 Obesity, unspecified: Secondary | ICD-10-CM | POA: Diagnosis present

## 2023-06-30 DIAGNOSIS — G510 Bell's palsy: Secondary | ICD-10-CM | POA: Diagnosis present

## 2023-06-30 DIAGNOSIS — K219 Gastro-esophageal reflux disease without esophagitis: Secondary | ICD-10-CM | POA: Diagnosis present

## 2023-06-30 DIAGNOSIS — E1122 Type 2 diabetes mellitus with diabetic chronic kidney disease: Secondary | ICD-10-CM | POA: Diagnosis present

## 2023-06-30 DIAGNOSIS — F05 Delirium due to known physiological condition: Secondary | ICD-10-CM | POA: Diagnosis not present

## 2023-06-30 DIAGNOSIS — I5032 Chronic diastolic (congestive) heart failure: Secondary | ICD-10-CM | POA: Diagnosis present

## 2023-06-30 DIAGNOSIS — D631 Anemia in chronic kidney disease: Secondary | ICD-10-CM | POA: Diagnosis present

## 2023-06-30 DIAGNOSIS — Z8249 Family history of ischemic heart disease and other diseases of the circulatory system: Secondary | ICD-10-CM

## 2023-06-30 DIAGNOSIS — A419 Sepsis, unspecified organism: Secondary | ICD-10-CM

## 2023-06-30 DIAGNOSIS — Z87891 Personal history of nicotine dependence: Secondary | ICD-10-CM

## 2023-06-30 LAB — COMPREHENSIVE METABOLIC PANEL
ALT: 9 U/L (ref 0–44)
AST: 18 U/L (ref 15–41)
Albumin: 2.9 g/dL — ABNORMAL LOW (ref 3.5–5.0)
Alkaline Phosphatase: 57 U/L (ref 38–126)
Anion gap: 9 (ref 5–15)
BUN: 17 mg/dL (ref 8–23)
CO2: 22 mmol/L (ref 22–32)
Calcium: 6.9 mg/dL — ABNORMAL LOW (ref 8.9–10.3)
Chloride: 108 mmol/L (ref 98–111)
Creatinine, Ser: 2.78 mg/dL — ABNORMAL HIGH (ref 0.61–1.24)
GFR, Estimated: 25 mL/min — ABNORMAL LOW (ref >60)
Glucose, Bld: 89 mg/dL (ref 70–99)
Potassium: 3.6 mmol/L (ref 3.5–5.1)
Sodium: 139 mmol/L (ref 135–145)
Total Bilirubin: 0.5 mg/dL (ref 0.3–1.2)
Total Protein: 6 g/dL — ABNORMAL LOW (ref 6.5–8.1)

## 2023-06-30 LAB — URINALYSIS, W/ REFLEX TO CULTURE (INFECTION SUSPECTED)
Bilirubin Urine: NEGATIVE
Glucose, UA: NEGATIVE mg/dL
Ketones, ur: NEGATIVE mg/dL
Nitrite: NEGATIVE
Protein, ur: >=300 mg/dL — AB
Specific Gravity, Urine: 1.015 (ref 1.005–1.030)
pH: 7 (ref 5.0–8.0)

## 2023-06-30 LAB — CBC WITH DIFFERENTIAL/PLATELET
Abs Immature Granulocytes: 0.03 10*3/uL (ref 0.00–0.07)
Basophils Absolute: 0 10*3/uL (ref 0.0–0.1)
Basophils Relative: 0 %
Eosinophils Absolute: 0.1 10*3/uL (ref 0.0–0.5)
Eosinophils Relative: 1 %
HCT: 31.1 % — ABNORMAL LOW (ref 39.0–52.0)
Hemoglobin: 9.9 g/dL — ABNORMAL LOW (ref 13.0–17.0)
Immature Granulocytes: 0 %
Lymphocytes Relative: 6 %
Lymphs Abs: 0.5 10*3/uL — ABNORMAL LOW (ref 0.7–4.0)
MCH: 31.6 pg (ref 26.0–34.0)
MCHC: 31.8 g/dL (ref 30.0–36.0)
MCV: 99.4 fL (ref 80.0–100.0)
Monocytes Absolute: 0.3 10*3/uL (ref 0.1–1.0)
Monocytes Relative: 3 %
Neutro Abs: 7.7 10*3/uL (ref 1.7–7.7)
Neutrophils Relative %: 90 %
Platelets: 156 10*3/uL (ref 150–400)
RBC: 3.13 MIL/uL — ABNORMAL LOW (ref 4.22–5.81)
RDW: 13.1 % (ref 11.5–15.5)
WBC: 8.6 10*3/uL (ref 4.0–10.5)
nRBC: 0 % (ref 0.0–0.2)

## 2023-06-30 LAB — CBG MONITORING, ED: Glucose-Capillary: 88 mg/dL (ref 70–99)

## 2023-06-30 LAB — LACTIC ACID, PLASMA
Lactic Acid, Venous: 2.8 mmol/L (ref 0.5–1.9)
Lactic Acid, Venous: 1.1 mmol/L (ref 0.5–1.9)

## 2023-06-30 LAB — RESP PANEL BY RT-PCR (RSV, FLU A&B, COVID)  RVPGX2
Influenza A by PCR: NEGATIVE
Influenza B by PCR: NEGATIVE
Resp Syncytial Virus by PCR: NEGATIVE
SARS Coronavirus 2 by RT PCR: NEGATIVE

## 2023-06-30 LAB — PROTIME-INR
INR: 1.1 (ref 0.8–1.2)
Prothrombin Time: 14.8 seconds (ref 11.4–15.2)

## 2023-06-30 LAB — APTT: aPTT: 28 seconds (ref 24–36)

## 2023-06-30 LAB — GLUCOSE, CAPILLARY: Glucose-Capillary: 144 mg/dL — ABNORMAL HIGH (ref 70–99)

## 2023-06-30 MED ORDER — TAMSULOSIN HCL 0.4 MG PO CAPS
0.4000 mg | ORAL_CAPSULE | Freq: Every day | ORAL | Status: DC
  Administered 2023-06-30 – 2023-07-07 (×8): 0.4 mg via ORAL
  Filled 2023-06-30 (×8): qty 1

## 2023-06-30 MED ORDER — TAMSULOSIN HCL 0.4 MG PO CAPS: 0.4000 mg | ORAL_CAPSULE | Freq: Every day | ORAL | Status: DC

## 2023-06-30 MED ORDER — VANCOMYCIN HCL IN DEXTROSE 1-5 GM/200ML-% IV SOLN
1000.0000 mg | Freq: Once | INTRAVENOUS | Status: DC
  Filled 2023-06-30: qty 200

## 2023-06-30 MED ORDER — SODIUM CHLORIDE 0.9 % IV SOLN
2.0000 g | INTRAVENOUS | Status: DC
  Administered 2023-07-01: 2 g via INTRAVENOUS
  Filled 2023-06-30 (×2): qty 20

## 2023-06-30 MED ORDER — PANTOPRAZOLE SODIUM 20 MG PO TBEC
20.0000 mg | DELAYED_RELEASE_TABLET | Freq: Every day | ORAL | Status: DC
  Administered 2023-07-01 – 2023-07-08 (×8): 20 mg via ORAL
  Filled 2023-06-30 (×9): qty 1

## 2023-06-30 MED ORDER — SODIUM CHLORIDE 0.9 % IV SOLN
2.0000 g | Freq: Once | INTRAVENOUS | Status: AC
  Administered 2023-06-30: 2 g via INTRAVENOUS
  Filled 2023-06-30: qty 12.5

## 2023-06-30 MED ORDER — ONDANSETRON HCL 4 MG/2ML IJ SOLN
4.0000 mg | Freq: Four times a day (QID) | INTRAMUSCULAR | Status: DC | PRN
  Administered 2023-07-05: 4 mg via INTRAVENOUS
  Filled 2023-06-30: qty 2

## 2023-06-30 MED ORDER — CARVEDILOL 25 MG PO TABS
25.0000 mg | ORAL_TABLET | Freq: Two times a day (BID) | ORAL | Status: DC
  Administered 2023-06-30 – 2023-07-02 (×3): 25 mg via ORAL
  Filled 2023-06-30 (×3): qty 1

## 2023-06-30 MED ORDER — ACETAMINOPHEN 325 MG RE SUPP
325.0000 mg | Freq: Once | RECTAL | Status: AC
  Administered 2023-06-30: 325 mg via RECTAL
  Filled 2023-06-30: qty 1

## 2023-06-30 MED ORDER — METRONIDAZOLE 500 MG/100ML IV SOLN
500.0000 mg | Freq: Once | INTRAVENOUS | Status: AC
  Administered 2023-06-30: 500 mg via INTRAVENOUS
  Filled 2023-06-30: qty 100

## 2023-06-30 MED ORDER — VANCOMYCIN HCL 2000 MG/400ML IV SOLN
2000.0000 mg | Freq: Once | INTRAVENOUS | Status: AC
  Administered 2023-06-30: 2000 mg via INTRAVENOUS
  Filled 2023-06-30: qty 400

## 2023-06-30 MED ORDER — LACTATED RINGERS IV SOLN: INTRAVENOUS | Status: DC

## 2023-06-30 MED ORDER — ONDANSETRON HCL 4 MG PO TABS: 4.0000 mg | ORAL_TABLET | Freq: Four times a day (QID) | ORAL | Status: DC | PRN

## 2023-06-30 MED ORDER — ROSUVASTATIN CALCIUM 10 MG PO TABS
10.0000 mg | ORAL_TABLET | Freq: Every evening | ORAL | Status: DC
  Administered 2023-06-30 – 2023-07-07 (×8): 10 mg via ORAL
  Filled 2023-06-30 (×9): qty 1

## 2023-06-30 MED ORDER — ASPIRIN 81 MG PO TBEC
81.0000 mg | DELAYED_RELEASE_TABLET | Freq: Every day | ORAL | Status: DC
  Administered 2023-06-30 – 2023-07-08 (×9): 81 mg via ORAL
  Filled 2023-06-30 (×9): qty 1

## 2023-06-30 MED ORDER — SODIUM CHLORIDE 0.9 % IV BOLUS (SEPSIS)
1000.0000 mL | Freq: Once | INTRAVENOUS | Status: AC
  Administered 2023-06-30: 1000 mL via INTRAVENOUS

## 2023-06-30 MED ORDER — HEPARIN SODIUM (PORCINE) 5000 UNIT/ML IJ SOLN
5000.0000 [IU] | Freq: Two times a day (BID) | INTRAMUSCULAR | Status: DC
  Administered 2023-06-30 – 2023-07-08 (×16): 5000 [IU] via SUBCUTANEOUS
  Filled 2023-06-30 (×16): qty 1

## 2023-06-30 MED ORDER — SERTRALINE HCL 50 MG PO TABS
100.0000 mg | ORAL_TABLET | Freq: Every day | ORAL | Status: DC
  Administered 2023-06-30 – 2023-07-08 (×9): 100 mg via ORAL
  Filled 2023-06-30 (×9): qty 2

## 2023-06-30 NOTE — ED Notes (Signed)
Advised nurse that patient has ready bed 

## 2023-06-30 NOTE — H&P (Signed)
History and Physical    BYRON SICKEL ZOX:[id number] DOB: 28-Sep-1961 DOA: 06/30/2023  PCP: Gabriel Earing, MD (Confirm with patient/family/NH records and if not entered, this has to be entered at [facility] point of entry) Patient coming from: Home  I have personally briefly reviewed patient's old medical records in [facility]  Chief Complaint: Patient confused.  HPI: Randall [name] is a 62 y.o. male with medical history significant of ESRD on HD MWF, chronic HFpEF, HTN, advanced dementia, BPH, brought in by caretaker for evaluation of confusion and possible UTI.  Patient has advanced dementia and unable to provide much of the history, most of the history provided by sister/caregiver at bedside.  Symptoms started yesterday evening after patient had a normal routine dialysis.  When sister changed patient underwear, she found some blood staining on his underwear, and patient became somewhat confused in the evening.  This morning patient became more confused and unable to communicate with his sister which is abnormal.  No fever as per sister.  ED Course: Patient was found zero 103.4, none tachycardia nonhypotensive breathing room air.  Lactic acid 2.8, WBC 8.6, K3.6.  CT head negative for acute findings.  Chest x-ray negative for acute infiltrates.  UA showed microscopic hematuria and WBC 21-50  Review of Systems: As per HPI otherwise 14 point review of systems negative.    Past Medical History:  Diagnosis Date   Anemia    Anxiety    Arthritis    CHF (congestive heart failure) (HCC)    Chronic kidney disease    Dementia (HCC)    Depression    Diabetes mellitus without complication (HCC)    GERD (gastroesophageal reflux disease)    Heart murmur    Hypertension    Hypertensive urgency 12/09/2021   Pneumonia    Stroke [facility])     Past Surgical History:  Procedure Laterality Date   AV FISTULA PLACEMENT Right 06/15/2022   Procedure: ARTERIOVENOUS (AV) FISTULA CREATION  (BRACHIALCEPHALIC);  Surgeon: Annice Needy, MD;  Location: [facility] ORS;  Service: Vascular;  Laterality: Right;   COLONOSCOPY W/ POLYPECTOMY     dental work     DIALYSIS/PERMA CATHETER INSERTION N/A 12/30/2021   Procedure: DIALYSIS/PERMA CATHETER INSERTION;  Surgeon: Annice Needy, MD;  Location: [facility] INVASIVE CV LAB;  Service: Cardiovascular;  Laterality: N/A;   DIALYSIS/PERMA CATHETER REMOVAL N/A 10/19/2022   Procedure: DIALYSIS/PERMA CATHETER REMOVAL;  Surgeon: Annice Needy, MD;  Location: [facility] INVASIVE CV LAB;  Service: Cardiovascular;  Laterality: N/A;   ESOPHAGOGASTRODUODENOSCOPY (EGD) WITH PROPOFOL N/A 12/26/2021   Procedure: ESOPHAGOGASTRODUODENOSCOPY (EGD) WITH PROPOFOL;  Surgeon: Jaynie Collins, DO;  Location: [facility] ENDOSCOPY;  Service: Gastroenterology;  Laterality: N/A;   TEMPORARY DIALYSIS CATHETER N/A 12/27/2021   Procedure: TEMPORARY DIALYSIS CATHETER;  Surgeon: Renford Dills, MD;  Location: [facility] INVASIVE CV LAB;  Service: Cardiovascular;  Laterality: N/A;   TONSILLECTOMY     adeniods removed     reports that he has quit smoking. His smoking use included cigars and cigarettes. He does not have any smokeless tobacco history on file. He reports current alcohol use of about 1.0 standard drink of alcohol per week. He reports that he does not use drugs.  Allergies  Allergen Reactions   Penicillins     Reaction in childhood     Family History  Problem Relation Age of Onset   Hypertension Mother    Dementia Mother    Hypertension Maternal Grandmother    Diabetes Maternal Grandmother  Stroke Maternal Grandmother    Heart attack Maternal Grandmother    Stroke Maternal Grandfather     Prior to Admission medications   Medication Sig Start Date End Date Taking? Authorizing Provider  aspirin 81 MG EC tablet Take 81 mg by mouth daily. 09/04/21  Yes [provider]  carvedilol (COREG) 25 MG tablet Take 25 mg by mouth 2 (two) times daily with a meal. 03/02/22  06/30/23 Yes [provider]  cholecalciferol (VITAMIN D3) 25 MCG (1000 UNIT) tablet Take 1,000 Units by mouth daily.   Yes [provider]  lansoprazole (PREVACID) 30 MG capsule Take 30 mg by mouth daily. 12/09/22  Yes [provider]  lidocaine-prilocaine (EMLA) cream Apply 1 Application topically once. 11/13/22  Yes [provider]  rosuvastatin (CRESTOR) 10 MG tablet Take 10 mg by mouth daily.   Yes [provider]  sertraline (ZOLOFT) 100 MG tablet Take 100 mg by mouth daily. 08/19/21  Yes [provider]  tamsulosin (FLOMAX) 0.4 MG CAPS capsule Take 0.4 mg by mouth daily.   Yes [provider]  cyanocobalamin 1000 MCG tablet Take 1,000 mcg by mouth daily. Patient not taking: Reported on 06/30/2023    [provider]  sevelamer carbonate (RENVELA) 800 MG tablet Take 800 mg by mouth 3 (three) times daily. Patient not taking: Reported on 06/30/2023 06/13/23   [provider]  thiamine (VITAMIN B1) 100 MG tablet Take 1 tablet by mouth daily. Patient not taking: Reported on 06/30/2023 07/27/22 07/27/23  [provider]    Physical Exam: Vitals:   06/30/23 1103 06/30/23 1115 06/30/23 1118 06/30/23 1130  BP: 121/69 109/71  120/68  Pulse: 89 88  88  Resp: 18 15  19   Temp:   99.4 F (37.4 C)   TempSrc:   Oral   SpO2: 100% 99%    Weight:        Constitutional: NAD, calm, comfortable Vitals:   06/30/23 1103 06/30/23 1115 06/30/23 1118 06/30/23 1130  BP: 121/69 109/71  120/68  Pulse: 89 88  88  Resp: 18 15  19   Temp:   99.4 F (37.4 C)   TempSrc:   Oral   SpO2: 100% 99%    Weight:       Eyes: PERRL, lids and conjunctivae normal ENMT: Mucous membranes are moist. Posterior pharynx clear of any exudate or lesions.Normal dentition.  Neck: normal, supple, no masses, no thyromegaly Respiratory: clear to auscultation bilaterally, no wheezing, no crackles. Normal respiratory effort. No accessory muscle use.   Cardiovascular: Regular rate and rhythm, no murmurs / rubs / gallops. No extremity edema. 2+ pedal pulses. No carotid bruits.  Abdomen: no tenderness, no masses palpated. No hepatosplenomegaly. Bowel sounds positive.  Musculoskeletal: no clubbing / cyanosis. No joint deformity upper and lower extremities. Good ROM, no contractures. Normal muscle tone.  Skin: no rashes, lesions, ulcers. No induration Neurologic: No facial droops, moving all limbs, following simple command Psychiatric: Awake, confused  Labs on Admission: I have personally reviewed following labs and imaging studies  CBC: Recent Labs  Lab 06/30/23 1008  WBC 8.6  NEUTROABS 7.7  HGB 9.9*  HCT 31.1*  MCV 99.4  PLT 156   Basic Metabolic Panel: Recent Labs  Lab 06/30/23 1008  NA 139  K 3.6  CL 108  CO2 22  GLUCOSE 89  BUN 17  CREATININE 2.78*  CALCIUM 6.9*   GFR: Estimated Creatinine Clearance: 29.6 mL/min (A) (by C-G formula based on SCr of 2.78 mg/dL (  H)). Liver Function Tests: Recent Labs  Lab 06/30/23 1008  AST 18  ALT 9  ALKPHOS 57  BILITOT 0.5  PROT 6.0*  ALBUMIN 2.9*   No results for input(s): "LIPASE", "AMYLASE" in the last 168 hours. No results for input(s): "AMMONIA" in the last 168 hours. Coagulation Profile: Recent Labs  Lab 06/30/23 1008  INR 1.1   Cardiac Enzymes: No results for input(s): "CKTOTAL", "CKMB", "CKMBINDEX", "TROPONINI" in the last 168 hours. BNP (last 3 results) No results for input(s): "PROBNP" in the last 8760 hours. HbA1C: No results for input(s): "HGBA1C" in the last 72 hours. CBG: Recent Labs  Lab 06/30/23 1006  GLUCAP 88   Lipid Profile: No results for input(s): "CHOL", "HDL", "LDLCALC", "TRIG", "CHOLHDL", "LDLDIRECT" in the last 72 hours. Thyroid Function Tests: No results for input(s): "TSH", "T4TOTAL", "FREET4", "T3FREE", "THYROIDAB" in the last 72 hours. Anemia Panel: No results for input(s): "VITAMINB12", "FOLATE", "FERRITIN", "TIBC", "IRON",  "RETICCTPCT" in the last 72 hours. Urine analysis:    Component Value Date/Time   COLORURINE YELLOW (A) 06/30/2023 1027   APPEARANCEUR HAZY (A) 06/30/2023 1027   LABSPEC 1.015 06/30/2023 1027   PHURINE 7.0 06/30/2023 1027   GLUCOSEU NEGATIVE 06/30/2023 1027   HGBUR SMALL (A) 06/30/2023 1027   BILIRUBINUR NEGATIVE 06/30/2023 1027   KETONESUR NEGATIVE 06/30/2023 1027   PROTEINUR >=300 (A) 06/30/2023 1027   NITRITE NEGATIVE 06/30/2023 1027   LEUKOCYTESUR LARGE (A) 06/30/2023 1027    Radiological Exams on Admission: CT Head Wo Contrast  Result Date: 06/30/2023 CLINICAL DATA:  Mental status change with unknown cause EXAM: CT HEAD WITHOUT CONTRAST TECHNIQUE: Contiguous axial images were obtained from the base of the skull through the vertex without intravenous contrast. RADIATION DOSE REDUCTION: This exam was performed according to the departmental dose-optimization program which includes automated exposure control, adjustment of the mA and/or kV according to patient size and/or use of iterative reconstruction technique. COMPARISON:  Brain MRI 04/06/2023 FINDINGS: Brain: No evidence of acute infarction, hemorrhage, hydrocephalus, extra-axial collection or mass lesion/mass effect. Extensive chronic small vessel ischemia in the cerebral white matter with confluent low-density. Chronic lacunar infarct in the right pons. Ischemia. Premature cerebral volume loss for age. Vascular: No hyperdense vessel or unexpected calcification. Skull: Normal. Negative for fracture or focal lesion. Sinuses/Orbits: No acute finding. IMPRESSION: 1. No acute or reversible finding. 2. Advanced chronic small vessel Electronically Signed   By: Tiburcio Pea M.D.   On: 06/30/2023 10:54   DG Chest Port 1 View  Result Date: 06/30/2023 CLINICAL DATA:  Questionable sepsis, evaluate for abnormality. EXAM: PORTABLE CHEST 1 VIEW COMPARISON:  One-view chest x-ray 02/04/2023 FINDINGS: The heart size is exaggerated by low lung  volumes. Atherosclerotic changes are present at the aortic arch. The lungs are clear. The visualized soft tissues and bony thorax are unremarkable. IMPRESSION: 1. Low lung volumes. 2. No acute cardiopulmonary disease. Electronically Signed   By: Marin Roberts M.D.   On: 06/30/2023 10:48    EKG: Independently reviewed.  Sinus, no acute ST changes.  Assessment/Plan Principal Problem:   Encephalopathy Active Problems:   Acute encephalopathy   UTI (urinary tract infection)   ESRD on dialysis (HCC)  (please populate well all problems here in Problem List. (For example, if patient is on BP meds at home and you resume or decide to hold them, it is a problem that needs to be her. Same for CAD, COPD, HLD and so on)  Sepsis, severe -Improving -Evidenced by fever, elevated lactic acid, with symptoms  and signs of endorgan damage of acute encephalopathy, and source of infection is UTI -Likely related to poorly controlled BPH, check PVR, continue Flomax -Clinically patient appears to be volume neutral, lactic acid level normalized after initial IV fluid bolus, will discontinue maintenance IV fluid -No recent urine culture to compare with, continue empiric ceftriaxone  ESRD on HD -Euvolemic, next HD on Monday  HTN Chronic HFpEF -Stable,  -Resume home med of Coreg  BPH -Check PVR -Continue Flomax  Advance dementia -Patient is DNR  DVT prophylaxis: Heparin subcu Code Status: Full code Family Communication: Sister at bedside Disposition Plan: Expect less than 2 midnight hospital stay Consults called: None  Admission status: Medsurg obs   Emeline General MD [facility] Pager [phone]  06/30/2023, 1:30 PM

## 2023-06-30 NOTE — Progress Notes (Signed)
Elink following for sepsis protocol. 

## 2023-06-30 NOTE — ED Provider Notes (Signed)
[facility] Provider Note    Event Date/Time   First MD Initiated Contact with Patient 06/30/23 [phone]     (approximate)   History   Altered Mental Status   HPI  Randall [name] is a 62 y.o. male with a history of CHF, end-stage renal disease, diabetes, CVA who presents with altered mental status.  Patient is unable to provide history.  Per EMS after breakfast patient became altered from baseline with episodes of nausea, patient does have dementia,     Physical Exam   Triage Vital Signs: ED Triage Vitals  Encounter Vitals Group     BP 06/30/23 0953 135/69     Systolic BP Percentile --      Diastolic BP Percentile --      Pulse Rate 06/30/23 0953 95     Resp 06/30/23 0953 20     Temp 06/30/23 0953 (!) 103.4 F (39.7 C)     Temp Source 06/30/23 0953 Oral     SpO2 06/30/23 0953 96 %     Weight 06/30/23 1014 88.2 kg (194 lb 7.1 oz)     Height --      Head Circumference --      Peak Flow --      Pain Score --      Pain Loc --      Pain Education --      Exclude from Growth Chart --     Most recent vital signs: Vitals:   06/30/23 1118 06/30/23 1130  BP:  120/68  Pulse:  88  Resp:  19  Temp: 99.4 F (37.4 C)   SpO2:       General: Not responding to commands, eyes open, withdraws from pain CV:  Good peripheral perfusion.  Resp:  Normal effort.  Abd:  No distention.  Soft, nontender Other:  No rash   ED Results / Procedures / Treatments   Labs (all labs ordered are listed, but only abnormal results are displayed) Labs Reviewed  LACTIC ACID, PLASMA - Abnormal; Notable for the following components:      Result Value   Lactic Acid, Venous 2.8 (*)    All other components within normal limits  COMPREHENSIVE METABOLIC PANEL - Abnormal; Notable for the following components:   Creatinine, Ser 2.78 (*)    Calcium 6.9 (*)    Total Protein 6.0 (*)    Albumin 2.9 (*)    GFR, Estimated 25 (*)    All other components within normal  limits  CBC WITH DIFFERENTIAL/PLATELET - Abnormal; Notable for the following components:   RBC 3.13 (*)    Hemoglobin 9.9 (*)    HCT 31.1 (*)    Lymphs Abs 0.5 (*)    All other components within normal limits  URINALYSIS, W/ REFLEX TO CULTURE (INFECTION SUSPECTED) - Abnormal; Notable for the following components:   Color, Urine YELLOW (*)    APPearance HAZY (*)    Hgb urine dipstick SMALL (*)    Protein, ur >=300 (*)    Leukocytes,Ua LARGE (*)    Bacteria, UA FEW (*)    All other components within normal limits  RESP PANEL BY RT-PCR (RSV, FLU A&B, COVID)  [id number]  CULTURE, BLOOD (ROUTINE X 2)  CULTURE, BLOOD (ROUTINE X 2)  URINE CULTURE  LACTIC ACID, PLASMA  PROTIME-INR  APTT  CBG MONITORING, ED     EKG  ED ECG REPORT I, Jene Every, the attending physician, personally viewed and interpreted this ECG.  Date: 06/30/2023  Rhythm: normal sinus rhythm QRS Axis: normal Intervals: normal ST/T Wave abnormalities: Nonspecific changes Narrative Interpretation: no evidence of acute ischemia    RADIOLOGY CT head viewed by me, no ICH    PROCEDURES:  Critical Care performed: yes  CRITICAL CARE Performed by: Jene Every   Total critical care time: 30 minutes  Critical care time was exclusive of separately billable procedures and treating other patients.  Critical care was necessary to treat or prevent imminent or life-threatening deterioration.  Critical care was time spent personally by me on the following activities: development of treatment plan with patient and/or surrogate as well as nursing, discussions with consultants, evaluation of patient's response to treatment, examination of patient, obtaining history from patient or surrogate, ordering and performing treatments and interventions, ordering and review of laboratory studies, ordering and review of radiographic studies, pulse oximetry and re-evaluation of patient's condition.   Procedures   MEDICATIONS  ORDERED IN ED: Medications  lactated ringers infusion ( Intravenous New Bag/Given 06/30/23 1036)  vancomycin (VANCOREADY) IVPB 2000 mg/400 mL (2,000 mg Intravenous New Bag/Given 06/30/23 1133)  aspirin EC tablet 81 mg (has no administration in time range)  carvedilol (COREG) tablet 25 mg (has no administration in time range)  rosuvastatin (CRESTOR) tablet 10 mg (has no administration in time range)  sertraline (ZOLOFT) tablet 100 mg (has no administration in time range)  pantoprazole (PROTONIX) EC tablet 20 mg (has no administration in time range)  tamsulosin (FLOMAX) capsule 0.4 mg (has no administration in time range)  heparin injection 5,000 Units (has no administration in time range)  ondansetron (ZOFRAN) tablet 4 mg (has no administration in time range)    Or  ondansetron (ZOFRAN) injection 4 mg (has no administration in time range)  cefTRIAXone (ROCEPHIN) 2 g in sodium chloride 0.9 % 100 mL IVPB (has no administration in time range)  sodium chloride 0.9 % bolus 1,000 mL (0 mLs Intravenous Stopped 06/30/23 1118)  ceFEPIme (MAXIPIME) 2 g in sodium chloride 0.9 % 100 mL IVPB (0 g Intravenous Stopped 06/30/23 1032)  metroNIDAZOLE (FLAGYL) IVPB 500 mg (0 mg Intravenous Stopped 06/30/23 1132)  acetaminophen (TYLENOL) suppository 325 mg (325 mg Rectal Given 06/30/23 1022)     IMPRESSION / MDM / ASSESSMENT AND PLAN / ED COURSE  I reviewed the triage vital signs and the nursing notes. Patient's presentation is most consistent with acute presentation with potential threat to life or bodily function.  Patient with reports of relatively abrupt onset of altered mental status.  Glucose checked, 88  Patient found to be for arrival to 103.4, suspicion for sepsis/infectious etiology.  Code sepsis activated.  Will also send for CT head  ----------------------------------------- 11:12 AM on 06/30/2023 ----------------------------------------- Spoke with family, they report actually patient has not been  feeling well over the last 24 hours with decreased p.o. intake  Lab work viewed, consistent with sepsis related to urinary tract infection, broad-spectrum antibiotics infusing, blood pressure stable, appropriate for admission to the hospitalist team      FINAL CLINICAL IMPRESSION(S) / ED DIAGNOSES   Final diagnoses:  Altered mental status, unspecified altered mental status type  Sepsis, due to unspecified organism, unspecified whether acute organ dysfunction present [facility])  Acute cystitis with hematuria     Rx / DC Orders   ED Discharge Orders     None        Note:  This document was prepared using Dragon voice recognition software and may include unintentional dictation errors.   Jene Every,  MD 06/30/23 1251

## 2023-06-30 NOTE — Progress Notes (Signed)
PHARMACY -  BRIEF ANTIBIOTIC NOTE   Pharmacy has received consult(s) for cefepime and vancomcyin  from an ED provider.  The patient's profile has been reviewed for ht/wt/allergies/indication/available labs.    Messaged RN @ 1010 requested patient weight for vancomycin dosing.   One time order(s) placed for Cefepime 2gm IV and Vancomycin 2g   Further antibiotics/pharmacy consults should be ordered by admitting physician if indicated.                       Thank you, Gardner Candle, PharmD, BCPS Clinical Pharmacist 06/30/2023 10:13 AM

## 2023-06-30 NOTE — Progress Notes (Signed)
CODE SEPSIS - PHARMACY COMMUNICATION  **Broad Spectrum Antibiotics should be administered within 1 hour of Sepsis diagnosis**  Time Code Sepsis Called/Page Received: 1011  Antibiotics Ordered: Vancomycin, Cefepime, Flagyl   Time of 1st antibiotic administration: 1015  Additional action taken by pharmacy: N/A  If necessary, Name of Provider/Nurse Contacted: N/A  Gardner Candle, PharmD, BCPS Clinical Pharmacist 06/30/2023 10:12 AM

## 2023-06-30 NOTE — ED Triage Notes (Addendum)
EMS states family took patient to breakfast, came home and patient became altered from baseline and started having episodes of vomiting; history of dementia. O2 mid 80's, placed on 2L [place].   4mg  Zofran 20G left hand

## 2023-07-01 DIAGNOSIS — N3001 Acute cystitis with hematuria: Secondary | ICD-10-CM

## 2023-07-01 DIAGNOSIS — G9340 Encephalopathy, unspecified: Secondary | ICD-10-CM | POA: Diagnosis not present

## 2023-07-01 DIAGNOSIS — A419 Sepsis, unspecified organism: Secondary | ICD-10-CM | POA: Diagnosis present

## 2023-07-01 LAB — BASIC METABOLIC PANEL
Anion gap: 9 (ref 5–15)
BUN: 29 mg/dL — ABNORMAL HIGH (ref 8–23)
CO2: 22 mmol/L (ref 22–32)
Calcium: 8.2 mg/dL — ABNORMAL LOW (ref 8.9–10.3)
Chloride: 100 mmol/L (ref 98–111)
Creatinine, Ser: 4.05 mg/dL — ABNORMAL HIGH (ref 0.61–1.24)
GFR, Estimated: 16 mL/min — ABNORMAL LOW (ref >60)
Glucose, Bld: 88 mg/dL (ref 70–99)
Potassium: 4 mmol/L (ref 3.5–5.1)
Sodium: 134 mmol/L — ABNORMAL LOW (ref 135–145)

## 2023-07-01 NOTE — Progress Notes (Addendum)
Progress Note    Randall [name]  BJY:[id number] DOB: [birth year]  DOA: 06/30/2023 PCP: Gabriel Earing, MD      Brief Narrative:    Medical records reviewed and are as summarized below:  Randall [name] is a 62 y.o. male  with medical history significant of ESRD on HD MWF, chronic HFpEF, HTN, advanced dementia, BPH, stroke, brought in by caretaker for evaluation of confusion and possible UTI.   Apparently, he became more confused after he had his routine hemodialysis on 06/29/2023.  His sister noticed blood stain in his underwear when he was changing the underwear.   He was febrile in the ED with temperature 103.4 F and tachypneic with respiratory rate of 22.  He was admitted to the hospital for severe sepsis secondary to acute UTI.   Assessment/Plan:   Principal Problem:   Encephalopathy Active Problems:   Acute encephalopathy   UTI (urinary tract infection)   ESRD on dialysis (HCC)   Body mass index is 30.64 kg/m.  (Obesity)    Sepsis secondary to acute UTI: Lactic acid was 2.8 on admission.  Continue IV ceftriaxone.  Urine culture is growing Enterococcus faecalis.  Follow-up susceptibility report.  No growth on blood cultures thus far.   ESRD on HD: Consulted Dr. Wynelle Link, nephrologist, for hemodialysis tomorrow.   Chronic diastolic CHF: Compensated   Acute metabolic encephalopathy on underlying dementia: He appears to be at baseline.  Continue supportive care.   Other comorbidities include BPH, hypertension, history of stroke on aspirin  Diet Order             Diet renal with fluid restriction Fluid restriction: 1200 mL Fluid; Room service appropriate? Yes; Fluid consistency: Thin  Diet effective now                            Consultants: None  Procedures: None    Medications:    aspirin EC  81 mg Oral Daily   carvedilol  25 mg Oral BID WC   heparin  5,000 Units Subcutaneous Q12H   pantoprazole  20 mg Oral Daily    rosuvastatin  10 mg Oral QPM   sertraline  100 mg Oral Daily   tamsulosin  0.4 mg Oral QHS   Continuous Infusions:  cefTRIAXone (ROCEPHIN)  IV 2 g (07/01/23 0847)     Anti-infectives (From admission, onward)    Start     Dose/Rate Route Frequency Ordered Stop   07/01/23 1000  cefTRIAXone (ROCEPHIN) 2 g in sodium chloride 0.9 % 100 mL IVPB        2 g 200 mL/hr over 30 Minutes Intravenous Every 24 hours 06/30/23 1222     06/30/23 1030  vancomycin (VANCOREADY) IVPB 2000 mg/400 mL        2,000 mg 200 mL/hr over 120 Minutes Intravenous  Once 06/30/23 1022 06/30/23 1338   06/30/23 1015  ceFEPIme (MAXIPIME) 2 g in sodium chloride 0.9 % 100 mL IVPB        2 g 200 mL/hr over 30 Minutes Intravenous  Once 06/30/23 1008 06/30/23 1032   06/30/23 1015  metroNIDAZOLE (FLAGYL) IVPB 500 mg        500 mg 100 mL/hr over 60 Minutes Intravenous  Once 06/30/23 1008 06/30/23 1132   06/30/23 1015  vancomycin (VANCOCIN) IVPB 1000 mg/200 mL premix  Status:  Discontinued        1,000 mg 200 mL/hr over 60 Minutes Intravenous  Once 06/30/23 1008 06/30/23 1022              Family Communication/Anticipated D/C date and plan/Code Status   DVT prophylaxis: heparin injection 5,000 Units Start: 06/30/23 1230 SCDs Start: 06/30/23 1220     Code Status: DNR  Family Communication: None Disposition Plan: Plan to discharge home   Status is: Observation The patient will require care spanning > 2 midnights and should be moved to inpatient because: Severe sepsis secondary to acute UTI on IV antibiotics       Subjective:   Interval events noted.  He has no complaints.  However, he has dementia and is unable to provide an adequate history.  Objective:    Vitals:   06/30/23 1628 06/30/23 1954 07/01/23 0616 07/01/23 0823  BP: 101/69 133/75 (!) 126/59 133/70  Pulse: 80 84 80 79  Resp: 18 18 18 16   Temp: 98.7 F (37.1 C) 98.6 F (37 C)  97.8 F (36.6 C)  TempSrc: Oral   Oral  SpO2: 100% 98%  99% 96%  Weight:      Height:       No data found.   Intake/Output Summary (Last 24 hours) at 07/01/2023 0906 Last data filed at 06/30/2023 1421 Gross per 24 hour  Intake 2282.92 ml  Output --  Net 2282.92 ml   Filed Weights   06/30/23 1014 06/30/23 1450  Weight: 88.2 kg 94.1 kg    Exam:   GEN: NAD SKIN: Warm and dry EYES: No pallor or icterus ENT: MMM CV: RRR PULM: CTA B ABD: soft, obese, NT, +BS CNS: AAO x 2,(person and place) non focal EXT: No edema or tenderness       Data Reviewed:   I have personally reviewed following labs and imaging studies:  Labs: Labs show the following:   Basic Metabolic Panel: Recent Labs  Lab 06/30/23 1008 07/01/23 0507  NA 139 134*  K 3.6 4.0  CL 108 100  CO2 22 22  GLUCOSE 89 88  BUN 17 29*  CREATININE 2.78* 4.05*  CALCIUM 6.9* 8.2*   GFR Estimated Creatinine Clearance: 21.7 mL/min (A) (by C-G formula based on SCr of 4.05 mg/dL (H)). Liver Function Tests: Recent Labs  Lab 06/30/23 1008  AST 18  ALT 9  ALKPHOS 57  BILITOT 0.5  PROT 6.0*  ALBUMIN 2.9*   No results for input(s): "LIPASE", "AMYLASE" in the last 168 hours. No results for input(s): "AMMONIA" in the last 168 hours. Coagulation profile Recent Labs  Lab 06/30/23 1008  INR 1.1    CBC: Recent Labs  Lab 06/30/23 1008  WBC 8.6  NEUTROABS 7.7  HGB 9.9*  HCT 31.1*  MCV 99.4  PLT 156   Cardiac Enzymes: No results for input(s): "CKTOTAL", "CKMB", "CKMBINDEX", "TROPONINI" in the last 168 hours. BNP (last 3 results) No results for input(s): "PROBNP" in the last 8760 hours. CBG: Recent Labs  Lab 06/30/23 1006 06/30/23 1629  GLUCAP 88 144*   D-Dimer: No results for input(s): "DDIMER" in the last 72 hours. Hgb A1c: No results for input(s): "HGBA1C" in the last 72 hours. Lipid Profile: No results for input(s): "CHOL", "HDL", "LDLCALC", "TRIG", "CHOLHDL", "LDLDIRECT" in the last 72 hours. Thyroid function studies: No results for  input(s): "TSH", "T4TOTAL", "T3FREE", "THYROIDAB" in the last 72 hours.  Invalid input(s): "FREET3" Anemia work up: No results for input(s): "VITAMINB12", "FOLATE", "FERRITIN", "TIBC", "IRON", "RETICCTPCT" in the last 72 hours. Sepsis Labs: Recent Labs  Lab 06/30/23 1008 06/30/23 1027  WBC 8.6  --   LATICACIDVEN 2.8* 1.1    Microbiology Recent Results (from the past 240 hour(s))  Resp panel by RT-PCR (RSV, Flu A&B, Covid) Anterior Nasal Swab     Status: None   Collection Time: 06/30/23 10:08 AM   Specimen: Anterior Nasal Swab  Result Value Ref Range Status   SARS Coronavirus 2 by RT PCR NEGATIVE NEGATIVE Final    Comment: (NOTE) SARS-CoV-2 target nucleic acids are NOT DETECTED.  The SARS-CoV-2 RNA is generally detectable in upper respiratory specimens during the acute phase of infection. The lowest concentration of SARS-CoV-2 viral copies this assay can detect is 138 copies/mL. A negative result does not preclude SARS-Cov-2 infection and should not be used as the sole basis for treatment or other patient management decisions. A negative result may occur with  improper specimen collection/handling, submission of specimen other than nasopharyngeal swab, presence of viral mutation(s) within the areas targeted by this assay, and inadequate number of viral copies(<138 copies/mL). A negative result must be combined with clinical observations, patient history, and epidemiological information. The expected result is Negative.  Fact Sheet for Patients:  BloggerCourse.com  Fact Sheet for Healthcare Providers:  SeriousBroker.it  This test is no t yet approved or cleared by the [place] FDA and  has been authorized for detection and/or diagnosis of SARS-CoV-2 by FDA under an Emergency Use Authorization (EUA). This EUA will remain  in effect (meaning this test can be used) for the duration of the COVID-19 declaration under  Section 564(b)(1) of the Act, 21 U.S.C.section 360bbb-3(b)(1), unless the authorization is terminated  or revoked sooner.       Influenza A by PCR NEGATIVE NEGATIVE Final   Influenza B by PCR NEGATIVE NEGATIVE Final    Comment: (NOTE) The Xpert Xpress SARS-CoV-2/FLU/RSV plus assay is intended as an aid in the diagnosis of influenza from Nasopharyngeal swab specimens and should not be used as a sole basis for treatment. Nasal washings and aspirates are unacceptable for Xpert Xpress SARS-CoV-2/FLU/RSV testing.  Fact Sheet for Patients: BloggerCourse.com  Fact Sheet for Healthcare Providers: SeriousBroker.it  This test is not yet approved or cleared by the [place] FDA and has been authorized for detection and/or diagnosis of SARS-CoV-2 by FDA under an Emergency Use Authorization (EUA). This EUA will remain in effect (meaning this test can be used) for the duration of the COVID-19 declaration under Section 564(b)(1) of the Act, 21 U.S.C. section 360bbb-3(b)(1), unless the authorization is terminated or revoked.     Resp Syncytial Virus by PCR NEGATIVE NEGATIVE Final    Comment: (NOTE) Fact Sheet for Patients: BloggerCourse.com  Fact Sheet for Healthcare Providers: SeriousBroker.it  This test is not yet approved or cleared by the [place] FDA and has been authorized for detection and/or diagnosis of SARS-CoV-2 by FDA under an Emergency Use Authorization (EUA). This EUA will remain in effect (meaning this test can be used) for the duration of the COVID-19 declaration under Section 564(b)(1) of the Act, 21 U.S.C. section 360bbb-3(b)(1), unless the authorization is terminated or revoked.  Performed at [facility], [address] [place], [place] [address]   Blood Culture (routine x 2)     Status: None (Preliminary result)   Collection Time: 06/30/23 10:27  AM   Specimen: BLOOD  Result Value Ref Range Status   Specimen Description BLOOD BLOOD LEFT ARM  Final   Special Requests   Final    BOTTLES DRAWN AEROBIC AND ANAEROBIC Blood Culture adequate volume  Culture   Final    NO GROWTH < 24 HOURS Performed at [facility], [address] [place], [place] [address]    Report Status PENDING  Incomplete  Blood Culture (routine x 2)     Status: None (Preliminary result)   Collection Time: 06/30/23 10:27 AM   Specimen: BLOOD  Result Value Ref Range Status   Specimen Description BLOOD LEFT ANTECUBITAL  Final   Special Requests   Final    BOTTLES DRAWN AEROBIC AND ANAEROBIC Blood Culture adequate volume   Culture   Final    NO GROWTH < 24 HOURS Performed at [facility], [address], [place], [place] [address]    Report Status PENDING  Incomplete    Procedures and diagnostic studies:  CT Head Wo Contrast  Result Date: 06/30/2023 CLINICAL DATA:  Mental status change with unknown cause EXAM: CT HEAD WITHOUT CONTRAST TECHNIQUE: Contiguous axial images were obtained from the base of the skull through the vertex without intravenous contrast. RADIATION DOSE REDUCTION: This exam was performed according to the departmental dose-optimization program which includes automated exposure control, adjustment of the mA and/or kV according to patient size and/or use of iterative reconstruction technique. COMPARISON:  Brain MRI 04/06/2023 FINDINGS: Brain: No evidence of acute infarction, hemorrhage, hydrocephalus, extra-axial collection or mass lesion/mass effect. Extensive chronic small vessel ischemia in the cerebral white matter with confluent low-density. Chronic lacunar infarct in the right pons. Ischemia. Premature cerebral volume loss for age. Vascular: No hyperdense vessel or unexpected calcification. Skull: Normal. Negative for fracture or focal lesion. Sinuses/Orbits: No acute finding. IMPRESSION: 1. No acute or reversible finding. 2.  Advanced chronic small vessel Electronically Signed   By: Tiburcio Pea M.D.   On: 06/30/2023 10:54   DG Chest Port 1 View  Result Date: 06/30/2023 CLINICAL DATA:  Questionable sepsis, evaluate for abnormality. EXAM: PORTABLE CHEST 1 VIEW COMPARISON:  One-view chest x-ray 02/04/2023 FINDINGS: The heart size is exaggerated by low lung volumes. Atherosclerotic changes are present at the aortic arch. The lungs are clear. The visualized soft tissues and bony thorax are unremarkable. IMPRESSION: 1. Low lung volumes. 2. No acute cardiopulmonary disease. Electronically Signed   By: Marin Roberts M.D.   On: 06/30/2023 10:48               LOS: 0 days   Marteze Vecchio  [facility]   Pager on [email or web address]. If 7PM-7AM, please contact night-coverage at [email or web address]     07/01/2023, 9:06 AM

## 2023-07-01 NOTE — Plan of Care (Signed)
Pt alert to self, denies any c/o pain. Pt eating well and swallows pills whole. Pt is renal pt, on renal diet with 1200 fluid restriction.  Problem: Education: Goal: Knowledge of General Education information will improve Description: Including pain rating scale, medication(s)/side effects and non-pharmacologic comfort measures Outcome: Not Progressing   Problem: Health Behavior/Discharge Planning: Goal: Ability to manage health-related needs will improve Outcome: Not Progressing   Problem: Clinical Measurements: Goal: Ability to maintain clinical measurements within normal limits will improve Outcome: Not Progressing Goal: Will remain free from infection Outcome: Not Progressing Goal: Diagnostic test results will improve Outcome: Not Progressing Goal: Respiratory complications will improve Outcome: Not Progressing Goal: Cardiovascular complication will be avoided Outcome: Not Progressing   Problem: Activity: Goal: Risk for activity intolerance will decrease Outcome: Not Progressing   Problem: Nutrition: Goal: Adequate nutrition will be maintained Outcome: Not Progressing   Problem: Coping: Goal: Level of anxiety will decrease Outcome: Not Progressing   Problem: Elimination: Goal: Will not experience complications related to bowel motility Outcome: Not Progressing Goal: Will not experience complications related to urinary retention Outcome: Not Progressing   Problem: Pain Managment: Goal: General experience of comfort will improve Outcome: Not Progressing   Problem: Safety: Goal: Ability to remain free from injury will improve Outcome: Not Progressing   Problem: Skin Integrity: Goal: Risk for impaired skin integrity will decrease Outcome: Not Progressing

## 2023-07-01 NOTE — Plan of Care (Signed)

## 2023-07-01 NOTE — TOC Progression Note (Signed)
Transition of Care [facility]) - Progression Note    Patient Details  Name: Randall [name] MRN: [id number] Date of Birth: [birth year]  Transition of Care [facility]) CM/SW Contact  Bing Quarry, RN Phone Number: 07/01/2023, 3:07 PM  Clinical Narrative:  8/25: 3pm. Attempted to reach out to sister to give MOON notice as 26 hours and 46 minutes as patient has dementia and was confused or intermittently confused. Tried cell phone twice but VM box is full. Provider just put note in stating patient should be changed to inpatient status. Pending UR input.   Gabriel Cirri MSN RN CM  Transitions of Care Department [facility] [phone] Weekends Only          Expected Discharge Plan and Services                                               Social Determinants of Health (SDOH) Interventions SDOH Screenings   Food Insecurity: No Food Insecurity (06/30/2023)  Housing: Low Risk  (06/30/2023)  Transportation Needs: No Transportation Needs (06/30/2023)  Utilities: Not At Risk (06/30/2023)  Financial Resource Strain: High Risk (01/26/2022)   Received from [facility], [facility]  Physical Activity: Insufficiently Active (08/18/2021)   Received from [facility], [facility]  Social Connections: Moderately Integrated (08/18/2021)   Received from [facility], [facility]  Stress: Stress Concern Present (01/26/2022)   Received from [facility], [facility]  Tobacco Use: Medium Risk (06/30/2023)  Health Literacy: Low Risk  (04/19/2023)   Received from [facility]    Readmission Risk Interventions     No data to display

## 2023-07-01 NOTE — Care Management Obs Status (Signed)
MEDICARE OBSERVATION STATUS NOTIFICATION   Patient Details  Name: Randall [name] MRN: [id number] Date of Birth: 06/30/1961   Medicare Observation Status Notification Given:  Yes    Bing Quarry, RN 07/01/2023, 3:36 PM

## 2023-07-02 DIAGNOSIS — R7881 Bacteremia: Secondary | ICD-10-CM | POA: Diagnosis not present

## 2023-07-02 DIAGNOSIS — F32A Depression, unspecified: Secondary | ICD-10-CM | POA: Diagnosis present

## 2023-07-02 DIAGNOSIS — B952 Enterococcus as the cause of diseases classified elsewhere: Secondary | ICD-10-CM | POA: Diagnosis not present

## 2023-07-02 DIAGNOSIS — F05 Delirium due to known physiological condition: Secondary | ICD-10-CM | POA: Diagnosis not present

## 2023-07-02 DIAGNOSIS — F419 Anxiety disorder, unspecified: Secondary | ICD-10-CM | POA: Diagnosis present

## 2023-07-02 DIAGNOSIS — E1122 Type 2 diabetes mellitus with diabetic chronic kidney disease: Secondary | ICD-10-CM | POA: Diagnosis present

## 2023-07-02 DIAGNOSIS — I5032 Chronic diastolic (congestive) heart failure: Secondary | ICD-10-CM | POA: Diagnosis present

## 2023-07-02 DIAGNOSIS — Z66 Do not resuscitate: Secondary | ICD-10-CM | POA: Diagnosis present

## 2023-07-02 DIAGNOSIS — K219 Gastro-esophageal reflux disease without esophagitis: Secondary | ICD-10-CM | POA: Diagnosis present

## 2023-07-02 DIAGNOSIS — G510 Bell's palsy: Secondary | ICD-10-CM | POA: Diagnosis present

## 2023-07-02 DIAGNOSIS — A419 Sepsis, unspecified organism: Secondary | ICD-10-CM

## 2023-07-02 DIAGNOSIS — A4181 Sepsis due to Enterococcus: Secondary | ICD-10-CM | POA: Diagnosis present

## 2023-07-02 DIAGNOSIS — R6520 Severe sepsis without septic shock: Secondary | ICD-10-CM | POA: Diagnosis present

## 2023-07-02 DIAGNOSIS — I132 Hypertensive heart and chronic kidney disease with heart failure and with stage 5 chronic kidney disease, or end stage renal disease: Secondary | ICD-10-CM | POA: Diagnosis present

## 2023-07-02 DIAGNOSIS — D631 Anemia in chronic kidney disease: Secondary | ICD-10-CM | POA: Diagnosis present

## 2023-07-02 DIAGNOSIS — N3001 Acute cystitis with hematuria: Secondary | ICD-10-CM | POA: Diagnosis present

## 2023-07-02 DIAGNOSIS — Z1152 Encounter for screening for COVID-19: Secondary | ICD-10-CM | POA: Diagnosis not present

## 2023-07-02 DIAGNOSIS — E872 Acidosis, unspecified: Secondary | ICD-10-CM | POA: Diagnosis present

## 2023-07-02 DIAGNOSIS — E669 Obesity, unspecified: Secondary | ICD-10-CM | POA: Diagnosis present

## 2023-07-02 DIAGNOSIS — Z992 Dependence on renal dialysis: Secondary | ICD-10-CM | POA: Diagnosis not present

## 2023-07-02 DIAGNOSIS — N186 End stage renal disease: Secondary | ICD-10-CM | POA: Diagnosis present

## 2023-07-02 DIAGNOSIS — N2581 Secondary hyperparathyroidism of renal origin: Secondary | ICD-10-CM | POA: Diagnosis present

## 2023-07-02 DIAGNOSIS — G9340 Encephalopathy, unspecified: Secondary | ICD-10-CM | POA: Diagnosis present

## 2023-07-02 DIAGNOSIS — E785 Hyperlipidemia, unspecified: Secondary | ICD-10-CM | POA: Diagnosis present

## 2023-07-02 DIAGNOSIS — J449 Chronic obstructive pulmonary disease, unspecified: Secondary | ICD-10-CM | POA: Diagnosis present

## 2023-07-02 DIAGNOSIS — F0390 Unspecified dementia without behavioral disturbance: Secondary | ICD-10-CM | POA: Diagnosis present

## 2023-07-02 DIAGNOSIS — I959 Hypotension, unspecified: Secondary | ICD-10-CM | POA: Diagnosis not present

## 2023-07-02 DIAGNOSIS — G9341 Metabolic encephalopathy: Secondary | ICD-10-CM | POA: Diagnosis present

## 2023-07-02 LAB — RENAL FUNCTION PANEL
Albumin: 2.8 g/dL — ABNORMAL LOW (ref 3.5–5.0)
Anion gap: 10 (ref 5–15)
BUN: 31 mg/dL — ABNORMAL HIGH (ref 8–23)
CO2: 22 mmol/L (ref 22–32)
Calcium: 8.5 mg/dL — ABNORMAL LOW (ref 8.9–10.3)
Chloride: 104 mmol/L (ref 98–111)
Creatinine, Ser: 4.27 mg/dL — ABNORMAL HIGH (ref 0.61–1.24)
GFR, Estimated: 15 mL/min — ABNORMAL LOW (ref >60)
Glucose, Bld: 82 mg/dL (ref 70–99)
Phosphorus: 3.2 mg/dL (ref 2.5–4.6)
Potassium: 4.2 mmol/L (ref 3.5–5.1)
Sodium: 136 mmol/L (ref 135–145)

## 2023-07-02 LAB — CBC
HCT: 27.6 % — ABNORMAL LOW (ref 39.0–52.0)
Hemoglobin: 9.4 g/dL — ABNORMAL LOW (ref 13.0–17.0)
MCH: 32.3 pg (ref 26.0–34.0)
MCHC: 34.1 g/dL (ref 30.0–36.0)
MCV: 94.8 fL (ref 80.0–100.0)
Platelets: 161 10*3/uL (ref 150–400)
RBC: 2.91 MIL/uL — ABNORMAL LOW (ref 4.22–5.81)
RDW: 13.2 % (ref 11.5–15.5)
WBC: 7.4 10*3/uL (ref 4.0–10.5)
nRBC: 0 % (ref 0.0–0.2)

## 2023-07-02 LAB — BLOOD CULTURE ID PANEL (REFLEXED) - BCID2

## 2023-07-02 LAB — GLUCOSE, CAPILLARY: Glucose-Capillary: 145 mg/dL — ABNORMAL HIGH (ref 70–99)

## 2023-07-02 LAB — HEPATITIS B SURFACE ANTIGEN: Hepatitis B Surface Ag: NONREACTIVE

## 2023-07-02 LAB — URINE CULTURE: Culture: >=100000 — AB

## 2023-07-02 MED ORDER — VANCOMYCIN HCL IN DEXTROSE 1-5 GM/200ML-% IV SOLN
1000.0000 mg | INTRAVENOUS | Status: DC
  Administered 2023-07-02: 1000 mg via INTRAVENOUS
  Filled 2023-07-02: qty 200

## 2023-07-02 MED ORDER — VANCOMYCIN HCL 2000 MG/400ML IV SOLN
2000.0000 mg | Freq: Once | INTRAVENOUS | Status: DC
  Filled 2023-07-02: qty 400

## 2023-07-02 MED ORDER — PENTAFLUOROPROP-TETRAFLUOROETH EX AERO: 1.0000 | INHALATION_SPRAY | CUTANEOUS | Status: DC | PRN

## 2023-07-02 MED ORDER — LIDOCAINE-PRILOCAINE 2.5-2.5 % EX CREA: 1.0000 | TOPICAL_CREAM | CUTANEOUS | Status: DC | PRN

## 2023-07-02 MED ORDER — LIDOCAINE HCL (PF) 1 % IJ SOLN
5.0000 mL | INTRAMUSCULAR | Status: DC | PRN
  Filled 2023-07-02: qty 5

## 2023-07-02 MED ORDER — VANCOMYCIN HCL IN DEXTROSE 1-5 GM/200ML-% IV SOLN
1000.0000 mg | Freq: Once | INTRAVENOUS | Status: AC
  Administered 2023-07-02: 1000 mg via INTRAVENOUS
  Filled 2023-07-02: qty 200

## 2023-07-02 MED ORDER — HEPARIN SODIUM (PORCINE) 1000 UNIT/ML DIALYSIS: 1000.0000 [IU] | INTRAMUSCULAR | Status: DC | PRN

## 2023-07-02 MED ORDER — CARVEDILOL 25 MG PO TABS
25.0000 mg | ORAL_TABLET | Freq: Two times a day (BID) | ORAL | Status: DC
  Administered 2023-07-02 – 2023-07-06 (×6): 25 mg via ORAL
  Filled 2023-07-02 (×7): qty 1

## 2023-07-02 MED ORDER — ALTEPLASE 2 MG IJ SOLR: 2.0000 mg | Freq: Once | INTRAMUSCULAR | Status: DC | PRN

## 2023-07-02 MED ORDER — ANTICOAGULANT SODIUM CITRATE 4% (200MG/5ML) IV SOLN
5.0000 mL | Status: DC | PRN
  Filled 2023-07-02: qty 5

## 2023-07-02 MED ORDER — SODIUM CHLORIDE 0.9 % IV SOLN: 2.0000 g | Freq: Three times a day (TID) | INTRAVENOUS | Status: DC

## 2023-07-02 MED ORDER — VANCOMYCIN VARIABLE DOSE PER UNSTABLE RENAL FUNCTION (PHARMACIST DOSING): Status: DC

## 2023-07-02 NOTE — Plan of Care (Signed)

## 2023-07-02 NOTE — Progress Notes (Signed)
Pharmacy Antibiotic Note  Randall [name] is a 62 y.o. male admitted on 06/30/2023 with bacteremia.  Pharmacy has been consulted for Vancomycin dosing.  E Faecalis growing in 1 of 4 bottles, no resistance detected.  Pt has PCN    Plan: Vancomycin 1 gm QHD. Will check vancomycin random 8/28 with AM labs  Height: 5\' 9"  (175.3 cm) Weight: 91.2 kg (201 lb 1 oz) IBW/kg (Calculated) : 70.7  Temp (24hrs), Avg:98.7 F (37.1 C), Min:98.2 F (36.8 C), Max:99.9 F (37.7 C)  Recent Labs  Lab 06/30/23 1008 06/30/23 1027 07/01/23 0507 07/02/23 0717  WBC 8.6  --   --  7.4  CREATININE 2.78*  --  4.05* 4.27*  LATICACIDVEN 2.8* 1.1  --   --     Estimated Creatinine Clearance: 20.3 mL/min (A) (by C-G formula based on SCr of 4.27 mg/dL (H)).    Allergies  Allergen Reactions   Penicillins     Reaction in childhood     Antimicrobials this admission:  Vancomycin >>   Dose adjustments this admission: N/a  Microbiology results: Ucx E. Faecalis (S vanc, ampicillin) Bcx 1/4 E. faecalis  Thank you for allowing pharmacy to be a part of this patient's care.   Elliot Gurney, PharmD, BCPS Clinical Pharmacist  07/02/2023 2:54 PM

## 2023-07-02 NOTE — Progress Notes (Signed)
[facility]  - [place] [facility]   PATIENT NAME: Randall [name]    MR#:  [id number]  DATE OF BIRTH:  03-16-1961  SUBJECTIVE:  patient seen in dialysis earlier. No family at bedside. Apparently came in with confusion and is being treated for positive blood cultures. Patient has advanced dementia unable to provide much of history.   VITALS:  Blood pressure (!) 166/54, pulse 66, temperature 98.2 F (36.8 C), temperature source Oral, resp. rate 11, height 5\' 9"  (1.753 m), weight 91.2 kg, SpO2 100%.  PHYSICAL EXAMINATION:  limited due to participation and patient seen and dialysis GENERAL:  62 y.o.-year-old patient with no acute distress.  LUNGS: Normal breath sounds bilaterally, no wheezing CARDIOVASCULAR: S1, S2 normal. No murmur   ABDOMEN: Soft, nontender, nondistended. Bowel sounds present.  EXTREMITIES: No  edema b/l.    NEUROLOGIC: nonfocal  patient is awake, unable to participate in conversation today  LABORATORY PANEL:  CBC Recent Labs  Lab 07/02/23 0717  WBC 7.4  HGB 9.4*  HCT 27.6*  PLT 161    Chemistries  Recent Labs  Lab 06/30/23 1008 07/01/23 0507 07/02/23 0717  NA 139   < > 136  K 3.6   < > 4.2  CL 108   < > 104  CO2 22   < > 22  GLUCOSE 89   < > 82  BUN 17   < > 31*  CREATININE 2.78*   < > 4.27*  CALCIUM 6.9*   < > 8.5*  AST 18  --   --   ALT 9  --   --   ALKPHOS 57  --   --   BILITOT 0.5  --   --    < > = values in this interval not displayed.   Assessment and Plan  Randall [name] is a 62 y.o. male  with medical history significant of ESRD on HD MWF, chronic HFpEF, HTN, advanced dementia, BPH, stroke, brought in by caretaker for evaluation of confusion and possible UTI.    Apparently, he became more confused after he had his routine hemodialysis on 06/29/2023.  His sister noticed blood stain in his underwear when he was changing the underwear.Pt was febrile in the ED with temperature 103.4 F and tachypneic with  respiratory rate of 22.  He was admitted to the hospital for severe sepsis secondary to acute UTI.   Severe Sepsis secondary to acute UTI:  --Lactic acid was 2.8 on admission.   --BC 1/2 GPC  --Urine culture is growing Enterococcus faecalis.   -- ID consultation  appreciated--recommends TTE, Bladder scan and PVR  --Patient currently on IV vancomycin -- patient afebrile. White count 7.4. -- Chest x-ray negative for pneumonia   ESRD on HD: Consulted Dr. Wynelle Link, nephrologist, for inhouse  hemodialysis.   Chronic diastolic CHF: Compensated   Acute metabolic encephalopathy on underlying dementia: He appears to be at baseline.  Continue supportive care.    BPH --cont flomax  Hypertension --cont coreg   history of stroke on aspirin +statin  PT/OT to see pt-- per sister who lives with patient at home patient ambulates using walker. Sister transport same to dialysis back-and-forth.  Procedures: Family communication :sister Randall [name] Consults : infectious disease, nephrology CODE STATUS: DNR DVT Prophylaxis : heparin Level of care: Med-Surg  The patient will require care spanning > 2 midnights and should be moved to inpatient because: Sepsis    TOTAL TIME TAKING CARE OF THIS PATIENT: 35 minutes.  >  50% time spent on counselling and coordination of care  Note: This dictation was prepared with Dragon dictation along with smaller phrase technology. Any transcriptional errors that result from this process are unintentional.  Randall [name] M.D    [facility]   CC: Primary care physician; Randall Earing, MD

## 2023-07-02 NOTE — Progress Notes (Signed)
Hemodialysis note  Received patient in bed to unit. Alert and oriented.  Informed consent signed and in chart.  Treatment initiated: 1610 Treatment completed: 1245  Patient tolerated well. Transported back to room, alert without acute distress.  Report given to patient's RN.   Access used: RUA AVF Access issues: none  Total UF removed: 2L Medication(s) given:  Vancomycin 1000 mg IV  Post HD weight: 91.2 kg   [name]

## 2023-07-02 NOTE — Progress Notes (Signed)
PHARMACY - PHYSICIAN COMMUNICATION CRITICAL VALUE ALERT - BLOOD CULTURE IDENTIFICATION (BCID)  Randall [name] is an 62 y.o. male who presented to [facility] on 06/30/2023 with a chief complaint of UTI, sepsis.  Assessment:  E faecalis in 1 of 4 bottles , no resistance.  PCN allergy.  (include suspected source if known)  Name of physician (or Provider) Contacted: [place]  Current antibiotics: Ceftriaxone 2 gm IV Q24H  Changes to prescribed antibiotics recommended:  Will d/c ceftriaxone and start Vanc ; MD wants to avoid ampicillin b/c PCN allergy, unable to confirm pt has tolerated PCN's before.   Results for orders placed or performed during the hospital encounter of 06/30/23  Blood Culture ID Panel (Reflexed) (Collected: 06/30/2023 10:27 AM)  Result Value Ref Range   Enterococcus faecalis DETECTED (A) NOT DETECTED   Enterococcus Faecium NOT DETECTED NOT DETECTED   Listeria monocytogenes NOT DETECTED NOT DETECTED   Staphylococcus species NOT DETECTED NOT DETECTED   Staphylococcus aureus (BCID) NOT DETECTED NOT DETECTED   Staphylococcus epidermidis NOT DETECTED NOT DETECTED   Staphylococcus lugdunensis NOT DETECTED NOT DETECTED   Streptococcus species NOT DETECTED NOT DETECTED   Streptococcus agalactiae NOT DETECTED NOT DETECTED   Streptococcus pneumoniae NOT DETECTED NOT DETECTED   Streptococcus pyogenes NOT DETECTED NOT DETECTED   A.calcoaceticus-baumannii NOT DETECTED NOT DETECTED   Bacteroides fragilis NOT DETECTED NOT DETECTED   Enterobacterales NOT DETECTED NOT DETECTED   Enterobacter cloacae complex NOT DETECTED NOT DETECTED   Escherichia coli NOT DETECTED NOT DETECTED   Klebsiella aerogenes NOT DETECTED NOT DETECTED   Klebsiella oxytoca NOT DETECTED NOT DETECTED   Klebsiella pneumoniae NOT DETECTED NOT DETECTED   Proteus species NOT DETECTED NOT DETECTED   Salmonella species NOT DETECTED NOT DETECTED   Serratia marcescens NOT DETECTED NOT DETECTED   Haemophilus  influenzae NOT DETECTED NOT DETECTED   Neisseria meningitidis NOT DETECTED NOT DETECTED   Pseudomonas aeruginosa NOT DETECTED NOT DETECTED   Stenotrophomonas maltophilia NOT DETECTED NOT DETECTED   Candida albicans NOT DETECTED NOT DETECTED   Candida auris NOT DETECTED NOT DETECTED   Candida glabrata NOT DETECTED NOT DETECTED   Candida krusei NOT DETECTED NOT DETECTED   Candida parapsilosis NOT DETECTED NOT DETECTED   Candida tropicalis NOT DETECTED NOT DETECTED   Cryptococcus neoformans/gattii NOT DETECTED NOT DETECTED   Vancomycin resistance NOT DETECTED NOT DETECTED    Kanton Kamel D 07/02/2023  6:28 AM

## 2023-07-02 NOTE — Progress Notes (Signed)
Pharmacy Antibiotic Note  Randall [name] is a 62 y.o. male admitted on 06/30/2023 with bacteremia.  Pharmacy has been consulted for Vancomycin dosing.  E Faecalis growing in 1 of 4 bottles, no resistance detected.   Pt has PCN   Plan: Vancomycin 2 gm IV X 1 ordered on 8/26 as loading dose.  Vancomycin 1 gm IV Q MWF - HD ordered to start on 8/26 following HD session.   Height: 5\' 9"  (175.3 cm) Weight: 94.1 kg (207 lb 8 oz) IBW/kg (Calculated) : 70.7  Temp (24hrs), Avg:98.8 F (37.1 C), Min:97.8 F (36.6 C), Max:99.9 F (37.7 C)  Recent Labs  Lab 06/30/23 1008 06/30/23 1027 07/01/23 0507  WBC 8.6  --   --   CREATININE 2.78*  --  4.05*  LATICACIDVEN 2.8* 1.1  --     Estimated Creatinine Clearance: 21.7 mL/min (A) (by C-G formula based on SCr of 4.05 mg/dL (H)).    Allergies  Allergen Reactions   Penicillins     Reaction in childhood     Antimicrobials this admission:   >>    >>   Dose adjustments this admission:   Microbiology results:  BCx:   UCx:    Sputum:    MRSA PCR:   Thank you for allowing pharmacy to be a part of this patient's care.  Haylie Mccutcheon D 07/02/2023 6:30 AM

## 2023-07-02 NOTE — Progress Notes (Signed)
[facility]  ROUNDING NOTE   Subjective:   Randall [name]  is a 62 year old male with past medical conditions including hypertension, type 2 diabetes, Bell's palsy, CVA, CHF, hyperlipidemia, and end-stage renal disease on hemodialysis.  Patient presents to the emergency department complaining of weakness and confusion.  Patient has been admitted under observation Encephalopathy [G93.40] Acute cystitis with hematuria [N30.01] Altered mental status, unspecified altered mental status type [R41.82] Sepsis, due to unspecified organism, unspecified whether acute organ dysfunction present [facility]) [A41.9]  Patient is known to our practice and receives outpatient dialysis treatments at [facility] on a MWF schedule, supervised by Dr. Cherylann Ratel. Patient is a poor historian and chart review used for this visit.   Patient seen and evaluated during dialysis.   HEMODIALYSIS FLOWSHEET:  Blood Flow Rate (mL/min): 400 mL/min Arterial Pressure (mmHg): -60.81 mmHg Venous Pressure (mmHg): 250.28 mmHg TMP (mmHg): 9.9 mmHg Ultrafiltration Rate (mL/min): 838 mL/min Dialysate Flow Rate (mL/min): 300 ml/min  Patient was brought to the emergency room by a caregiver due to altered mental status. He was taken out to breakfast with family and returned home with vomiting and altered.   UA appears hazy with leukocytes and protein. Urine culture positive for enterococcus faecalis, positive blood culture for the same. Chest xray only shows low volumes. CT head only shows small vessel changes.    Objective:  Vital signs in last 24 hours:  Temp:  [98.2 F (36.8 C)-99.9 F (37.7 C)] 98.2 F (36.8 C) (08/26 1245) Pulse Rate:  [64-67] 66 (08/26 1245) Resp:  [11-19] 11 (08/26 1245) BP: (109-179)/(48-88) 166/54 (08/26 1245) SpO2:  [98 %-100 %] 100 % (08/26 1245) Weight:  [91.2 kg-92.7 kg] 91.2 kg (08/26 1245)  Weight change:  Filed Weights   06/30/23 1450 07/02/23 0856 07/02/23 1245  Weight:  94.1 kg 92.7 kg 91.2 kg    Intake/Output: I/O last 3 completed shifts: In: 120 [P.O.:120] Out: -    Intake/Output this shift:  Total I/O In: -  Out: 2000 [Other:2000]  Physical Exam: General: NAD  Head: Normocephalic, atraumatic. Moist oral mucosal membranes  Eyes: Anicteric  Lungs:  Clear to auscultation, normal effort  Heart: Regular rate and rhythm  Abdomen:  Soft, nontender  Extremities:  No peripheral edema.  Neurologic: Nonfocal, moving all four extremities  Skin: No lesions  Access: Rt AVF    Basic Metabolic Panel: Recent Labs  Lab 06/30/23 1008 07/01/23 0507 07/02/23 0717  NA 139 134* 136  K 3.6 4.0 4.2  CL 108 100 104  CO2 22 22 22   GLUCOSE 89 88 82  BUN 17 29* 31*  CREATININE 2.78* 4.05* 4.27*  CALCIUM 6.9* 8.2* 8.5*  PHOS  --   --  3.2    Liver Function Tests: Recent Labs  Lab 06/30/23 1008 07/02/23 0717  AST 18  --   ALT 9  --   ALKPHOS 57  --   BILITOT 0.5  --   PROT 6.0*  --   ALBUMIN 2.9* 2.8*   No results for input(s): "LIPASE", "AMYLASE" in the last 168 hours. No results for input(s): "AMMONIA" in the last 168 hours.  CBC: Recent Labs  Lab 06/30/23 1008 07/02/23 0717  WBC 8.6 7.4  NEUTROABS 7.7  --   HGB 9.9* 9.4*  HCT 31.1* 27.6*  MCV 99.4 94.8  PLT 156 161    Cardiac Enzymes: No results for input(s): "CKTOTAL", "CKMB", "CKMBINDEX", "TROPONINI" in the last 168 hours.  BNP: Invalid input(s): "POCBNP"  CBG: Recent  Labs  Lab 06/30/23 1006 06/30/23 1629  GLUCAP 88 144*    Microbiology: Results for orders placed or performed during the hospital encounter of 06/30/23  Resp panel by RT-PCR (RSV, Flu A&B, Covid) Anterior Nasal Swab     Status: None   Collection Time: 06/30/23 10:08 AM   Specimen: Anterior Nasal Swab  Result Value Ref Range Status   SARS Coronavirus 2 by RT PCR NEGATIVE NEGATIVE Final    Comment: (NOTE) SARS-CoV-2 target nucleic acids are NOT DETECTED.  The SARS-CoV-2 RNA is generally detectable  in upper respiratory specimens during the acute phase of infection. The lowest concentration of SARS-CoV-2 viral copies this assay can detect is 138 copies/mL. A negative result does not preclude SARS-Cov-2 infection and should not be used as the sole basis for treatment or other patient management decisions. A negative result may occur with  improper specimen collection/handling, submission of specimen other than nasopharyngeal swab, presence of viral mutation(s) within the areas targeted by this assay, and inadequate number of viral copies(<138 copies/mL). A negative result must be combined with clinical observations, patient history, and epidemiological information. The expected result is Negative.  Fact Sheet for Patients:  BloggerCourse.com  Fact Sheet for Healthcare Providers:  SeriousBroker.it  This test is no t yet approved or cleared by the [place] FDA and  has been authorized for detection and/or diagnosis of SARS-CoV-2 by FDA under an Emergency Use Authorization (EUA). This EUA will remain  in effect (meaning this test can be used) for the duration of the COVID-19 declaration under Section 564(b)(1) of the Act, 21 U.S.C.section 360bbb-3(b)(1), unless the authorization is terminated  or revoked sooner.       Influenza A by PCR NEGATIVE NEGATIVE Final   Influenza B by PCR NEGATIVE NEGATIVE Final    Comment: (NOTE) The Xpert Xpress SARS-CoV-2/FLU/RSV plus assay is intended as an aid in the diagnosis of influenza from Nasopharyngeal swab specimens and should not be used as a sole basis for treatment. Nasal washings and aspirates are unacceptable for Xpert Xpress SARS-CoV-2/FLU/RSV testing.  Fact Sheet for Patients: BloggerCourse.com  Fact Sheet for Healthcare Providers: SeriousBroker.it  This test is not yet approved or cleared by the [place] FDA and has  been authorized for detection and/or diagnosis of SARS-CoV-2 by FDA under an Emergency Use Authorization (EUA). This EUA will remain in effect (meaning this test can be used) for the duration of the COVID-19 declaration under Section 564(b)(1) of the Act, 21 U.S.C. section 360bbb-3(b)(1), unless the authorization is terminated or revoked.     Resp Syncytial Virus by PCR NEGATIVE NEGATIVE Final    Comment: (NOTE) Fact Sheet for Patients: BloggerCourse.com  Fact Sheet for Healthcare Providers: SeriousBroker.it  This test is not yet approved or cleared by the [place] FDA and has been authorized for detection and/or diagnosis of SARS-CoV-2 by FDA under an Emergency Use Authorization (EUA). This EUA will remain in effect (meaning this test can be used) for the duration of the COVID-19 declaration under Section 564(b)(1) of the Act, 21 U.S.C. section 360bbb-3(b)(1), unless the authorization is terminated or revoked.  Performed at [facility], [address] [place], [place] [address]   Blood Culture (routine x 2)     Status: None (Preliminary result)   Collection Time: 06/30/23 10:27 AM   Specimen: BLOOD  Result Value Ref Range Status   Specimen Description   Final    BLOOD BLOOD LEFT ARM Performed at [facility], [address]  [place], [place] [address]    Special Requests   Final    BOTTLES DRAWN AEROBIC AND ANAEROBIC Blood Culture adequate volume Performed at [facility], [address] [place], [place] [address]    Culture  Setup Time   Final    GRAM POSITIVE COCCI AEROBIC BOTTLE ONLY Organism ID to follow CRITICAL RESULT CALLED TO, READ BACK BY AND VERIFIED WITH: JASON ROBBINS @ 0604 07/01/23 LFD GRAM STAIN REVIEWED-AGREE WITH RESULT Performed at [facility], [address] [place], [place] [address]    Culture GRAM POSITIVE COCCI  Final   Report Status PENDING   Incomplete  Blood Culture (routine x 2)     Status: None (Preliminary result)   Collection Time: 06/30/23 10:27 AM   Specimen: BLOOD  Result Value Ref Range Status   Specimen Description BLOOD LEFT ANTECUBITAL  Final   Special Requests   Final    BOTTLES DRAWN AEROBIC AND ANAEROBIC Blood Culture adequate volume   Culture   Final    NO GROWTH 2 DAYS Performed at [facility], [address] [place], [place] [address]    Report Status PENDING  Incomplete  Urine Culture     Status: Abnormal   Collection Time: 06/30/23 10:27 AM   Specimen: Urine, Random  Result Value Ref Range Status   Specimen Description   Final    URINE, RANDOM Performed at [facility], [address] [place], [place] [address]    Special Requests   Final    NONE Reflexed from [id number] Performed at [facility], [address] [place], [place] [address]    Culture >=100,000 COLONIES/mL ENTEROCOCCUS FAECALIS (A)  Final   Report Status 07/02/2023 FINAL  Final   Organism ID, Bacteria ENTEROCOCCUS FAECALIS (A)  Final      Susceptibility   Enterococcus faecalis - MIC*    AMPICILLIN <=2 SENSITIVE Sensitive     NITROFURANTOIN <=16 SENSITIVE Sensitive     VANCOMYCIN <=0.5 SENSITIVE Sensitive     * >=100,000 COLONIES/mL ENTEROCOCCUS FAECALIS  Blood Culture ID Panel (Reflexed)     Status: Abnormal   Collection Time: 06/30/23 10:27 AM  Result Value Ref Range Status   Enterococcus faecalis DETECTED (A) NOT DETECTED Final    Comment: CRITICAL RESULT CALLED TO, READ BACK BY AND VERIFIED WITH: JASON ROBBINS @ 0604 07/01/23 LFD    Enterococcus Faecium NOT DETECTED NOT DETECTED Final   Listeria monocytogenes NOT DETECTED NOT DETECTED Final   Staphylococcus species NOT DETECTED NOT DETECTED Final   Staphylococcus aureus (BCID) NOT DETECTED NOT DETECTED Final   Staphylococcus epidermidis NOT DETECTED NOT DETECTED Final   Staphylococcus lugdunensis NOT DETECTED NOT DETECTED Final    Streptococcus species NOT DETECTED NOT DETECTED Final   Streptococcus agalactiae NOT DETECTED NOT DETECTED Final   Streptococcus pneumoniae NOT DETECTED NOT DETECTED Final   Streptococcus pyogenes NOT DETECTED NOT DETECTED Final   A.calcoaceticus-baumannii NOT DETECTED NOT DETECTED Final   Bacteroides fragilis NOT DETECTED NOT DETECTED Final   Enterobacterales NOT DETECTED NOT DETECTED Final   Enterobacter cloacae complex NOT DETECTED NOT DETECTED Final   Escherichia coli NOT DETECTED NOT DETECTED Final   Klebsiella aerogenes NOT DETECTED NOT DETECTED Final   Klebsiella oxytoca NOT DETECTED NOT DETECTED Final   Klebsiella pneumoniae NOT DETECTED NOT DETECTED Final   Proteus species NOT DETECTED NOT DETECTED Final   Salmonella species NOT DETECTED NOT DETECTED Final   Serratia marcescens NOT DETECTED NOT DETECTED Final   Haemophilus  influenzae NOT DETECTED NOT DETECTED Final   Neisseria meningitidis NOT DETECTED NOT DETECTED Final   Pseudomonas aeruginosa NOT DETECTED NOT DETECTED Final   Stenotrophomonas maltophilia NOT DETECTED NOT DETECTED Final   Candida albicans NOT DETECTED NOT DETECTED Final   Candida auris NOT DETECTED NOT DETECTED Final   Candida glabrata NOT DETECTED NOT DETECTED Final   Candida krusei NOT DETECTED NOT DETECTED Final   Candida parapsilosis NOT DETECTED NOT DETECTED Final   Candida tropicalis NOT DETECTED NOT DETECTED Final   Cryptococcus neoformans/gattii NOT DETECTED NOT DETECTED Final   Vancomycin resistance NOT DETECTED NOT DETECTED Final    Comment: Performed at [facility], [address] [place], [place] [address]    Coagulation Studies: Recent Labs    06/30/23 1008  LABPROT 14.8  INR 1.1    Urinalysis: Recent Labs    06/30/23 1027  COLORURINE YELLOW*  LABSPEC 1.015  PHURINE 7.0  GLUCOSEU NEGATIVE  HGBUR SMALL*  BILIRUBINUR NEGATIVE  KETONESUR NEGATIVE  PROTEINUR >=300*  NITRITE NEGATIVE  LEUKOCYTESUR LARGE*       Imaging: No results found.   Medications:    vancomycin Stopped (07/02/23 1232)   vancomycin      aspirin EC  81 mg Oral Daily   carvedilol  25 mg Oral BID WC   heparin  5,000 Units Subcutaneous Q12H   pantoprazole  20 mg Oral Daily   rosuvastatin  10 mg Oral QPM   sertraline  100 mg Oral Daily   tamsulosin  0.4 mg Oral QHS   ondansetron **OR** ondansetron (ZOFRAN) IV  Assessment/ Plan:  Randall [name] is a 62 y.o.  male past medical conditions including hypertension, type 2 diabetes, Bell's palsy, CVA, CHF, hyperlipidemia, and end-stage renal disease on hemodialysis.  Patient presents to the emergency department complaining of weakness and confusion.  Patient has been admitted under observation Encephalopathy [G93.40] Acute cystitis with hematuria [N30.01] Altered mental status, unspecified altered mental status type [R41.82] Sepsis, due to unspecified organism, unspecified whether acute organ dysfunction present (HCC) [A41.9]   Sepsis secondary due to UTI. Urine and blood culture growing Enterococcus faecalis. Continue IV cefriaxone.   2. End stage renal disease on hemodialysis. Treatment received today, UF goal 2L as tolerated. Next treatment scheduled for Wednesday  3. Anemia of chronic kidney disease Lab Results  Component Value Date   HGB 9.4 (L) 07/02/2023    Hgb within desired range for renal patient, 9-11. Patient receives Mircera at outpatient clinic.   4. Hypertension with chronic kidney disease. Home regimen includes carvedilol. Also on tamsulosin. Receiving these medications.   5. Secondary Hyperparathyroidism: with outpatient labs: none available  Lab Results  Component Value Date   CALCIUM 8.5 (L) 07/02/2023   CAION 1.19 06/15/2022   PHOS 3.2 07/02/2023    Patient receives cholecalciferol and sevelamer outpatient. Will continue to monitor bone minerals during this admission.    LOS: 0   8/26/20241:30 PM

## 2023-07-02 NOTE — Plan of Care (Signed)

## 2023-07-02 NOTE — Consult Note (Signed)
NAME: Randall [name]  DOB: 1961/11/06  MRN: [id number]  Date/Time: 07/02/2023 1:48 PM  REQUESTING PROVIDER: Dr.PAtel Subjective:  REASON FOR CONSULT: enterococcus bacteremia ? Randall [name] is a 62 y.o. with a history of end-stage renal disease on dialysis, diabetes mellitus, chronic systolic CHF, dementia with behavioral disturbance, CVA, presented to the ED on 06/30/2023 from home after becoming altered from baseline and also starting to have episodes of vomiting. In the ED vitals BP of 135/69, temperature 103.4, pulse 95, respiratory 20, sats 96%.  Patient was not able to provide any history. Labs revealed WBC of 8.6, lactate 2.8, potassium 3.6.  UA showed WBC of 21-50. Patient was started on IV antibiotics and IV fluids I am seeing the patient as blood cultures positive for Enterococcus faecalis.:  Past Medical History:  Diagnosis Date   Anemia    Anxiety    Arthritis    CHF (congestive heart failure) (HCC)    Chronic kidney disease    Dementia (HCC)    Depression    Diabetes mellitus without complication (HCC)    GERD (gastroesophageal reflux disease)    Heart murmur    Hypertension    Hypertensive urgency 12/09/2021   Pneumonia    Stroke [facility])     Past Surgical History:  Procedure Laterality Date   AV FISTULA PLACEMENT Right 06/15/2022   Procedure: ARTERIOVENOUS (AV) FISTULA CREATION (BRACHIALCEPHALIC);  Surgeon: Annice Needy, MD;  Location: [facility] ORS;  Service: Vascular;  Laterality: Right;   COLONOSCOPY W/ POLYPECTOMY     dental work     DIALYSIS/PERMA CATHETER INSERTION N/A 12/30/2021   Procedure: DIALYSIS/PERMA CATHETER INSERTION;  Surgeon: Annice Needy, MD;  Location: [facility] INVASIVE CV LAB;  Service: Cardiovascular;  Laterality: N/A;   DIALYSIS/PERMA CATHETER REMOVAL N/A 10/19/2022   Procedure: DIALYSIS/PERMA CATHETER REMOVAL;  Surgeon: Annice Needy, MD;  Location: [facility] INVASIVE CV LAB;  Service: Cardiovascular;  Laterality: N/A;   ESOPHAGOGASTRODUODENOSCOPY  (EGD) WITH PROPOFOL N/A 12/26/2021   Procedure: ESOPHAGOGASTRODUODENOSCOPY (EGD) WITH PROPOFOL;  Surgeon: Jaynie Collins, DO;  Location: [facility] ENDOSCOPY;  Service: Gastroenterology;  Laterality: N/A;   TEMPORARY DIALYSIS CATHETER N/A 12/27/2021   Procedure: TEMPORARY DIALYSIS CATHETER;  Surgeon: Renford Dills, MD;  Location: [facility] INVASIVE CV LAB;  Service: Cardiovascular;  Laterality: N/A;   TONSILLECTOMY     adeniods removed    Social History   Socioeconomic History   Marital status: Divorced    Spouse name: Not on file   Number of children: Not on file   Years of education: Not on file   Highest education level: Not on file  Occupational History   Not on file  Tobacco Use   Smoking status: Former    Current packs/day: 0.25    Types: Cigars, Cigarettes   Smokeless tobacco: Not on file  Substance and Sexual Activity   Alcohol use: Yes    Alcohol/week: 1.0 standard drink of alcohol    Types: 1 Glasses of wine per week    Comment: rarely   Drug use: Never   Sexual activity: Not Currently  Other Topics Concern   Not on file  Social History Narrative   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   Social Determinants of Health   Financial Resource Strain: High Risk (01/26/2022)   Received [facility], [facility]   Overall Financial Resource Strain (CARDIA)    Difficulty of Paying Living Expenses: Hard  Food Insecurity: No Food Insecurity (06/30/2023)   Hunger Vital Sign  Worried About Programme researcher, broadcasting/film/video in the Last Year: Never true    Ran Out of Food in the Last Year: Never true  Transportation Needs: No Transportation Needs (06/30/2023)   PRAPARE - Administrator, Civil Service (Medical): No    Lack of Transportation (Non-Medical): No  Physical Activity: Insufficiently Active (08/18/2021)   Received from [facility], [facility]   Exercise Vital Sign    Days of Exercise per Week: 2 days    Minutes of Exercise per Session: 20  min  Stress: Stress Concern Present (01/26/2022)   Received from [facility], [facility] of Occupational Health - Occupational Stress Questionnaire    Feeling of Stress : To some extent  Social Connections: Moderately Integrated (08/18/2021)   Received from [facility], [facility]   Social Connection and Isolation Panel [NHANES]    Frequency of Communication with Friends and Family: More than three times a week    Frequency of Social Gatherings with Friends and Family: Once a week    Attends Religious Services: 1 to 4 times per year    Active Member of [employer] or Organizations: Yes    Attends Banker Meetings: Never    Marital Status: Separated  Intimate Partner Violence: Not At Risk (06/30/2023)   Humiliation, Afraid, Rape, and Kick questionnaire    Fear of Current or Ex-Partner: No    Emotionally Abused: No    Physically Abused: No    Sexually Abused: No    Family History  Problem Relation Age of Onset   Hypertension Mother    Dementia Mother    Hypertension Maternal Grandmother    Diabetes Maternal Grandmother    Stroke Maternal Grandmother    Heart attack Maternal Grandmother    Stroke Maternal Grandfather    Allergies  Allergen Reactions   Penicillins     Reaction in childhood    I? Current Facility-Administered Medications  Medication Dose Route Frequency Provider Last Rate Last Admin   aspirin EC tablet 81 mg  81 mg Oral Daily Mikey College T, MD   81 mg at 07/01/23 0843   carvedilol (COREG) tablet 25 mg  25 mg Oral BID WC Mikey College T, MD   25 mg at 07/01/23 0843   heparin injection 5,000 Units  5,000 Units Subcutaneous Q12H Mikey College T, MD   5,000 Units at 07/01/23 2108   ondansetron (ZOFRAN) tablet 4 mg  4 mg Oral Q6H PRN Mikey College T, MD       Or   ondansetron [facility]) injection 4 mg  4 mg Intravenous Q6H PRN Mikey College T, MD       pantoprazole (PROTONIX) EC tablet 20 mg  20 mg Oral Daily Mikey College T, MD   20  mg at 07/01/23 0842   rosuvastatin (CRESTOR) tablet 10 mg  10 mg Oral QPM Mikey College T, MD   10 mg at 07/01/23 1744   sertraline (ZOLOFT) tablet 100 mg  100 mg Oral Daily Mikey College T, MD   100 mg at 07/01/23 0843   tamsulosin (FLOMAX) capsule 0.4 mg  0.4 mg Oral QHS Marrion Coy, MD   0.4 mg at 07/01/23 2109   vancomycin (VANCOCIN) IVPB 1000 mg/200 mL premix  1,000 mg Intravenous Q M,W,F-HD Enedina Finner, MD   Stopped at 07/02/23 1232   vancomycin (VANCOCIN) IVPB 1000 mg/200 mL premix  1,000 mg Intravenous Once Littie Deeds, [facility]  Abtx:  Anti-infectives (From admission, onward)    Start     Dose/Rate Route Frequency Ordered Stop   07/02/23 1330  vancomycin (VANCOCIN) IVPB 1000 mg/200 mL premix        1,000 mg 200 mL/hr over 60 Minutes Intravenous  Once 07/02/23 1320     07/02/23 1200  vancomycin (VANCOCIN) IVPB 1000 mg/200 mL premix        1,000 mg 200 mL/hr over 60 Minutes Intravenous Every M-W-F (Hemodialysis) 07/02/23 0628     07/02/23 0715  vancomycin (VANCOREADY) IVPB 2000 mg/400 mL  Status:  Discontinued        2,000 mg 200 mL/hr over 120 Minutes Intravenous  Once 07/02/23 0626 07/02/23 1320   07/02/23 0700  ampicillin (OMNIPEN) 2 g in sodium chloride 0.9 % 100 mL IVPB  Status:  Discontinued        2 g 300 mL/hr over 20 Minutes Intravenous Every 8 hours 07/02/23 0614 07/02/23 0624   07/01/23 1000  cefTRIAXone (ROCEPHIN) 2 g in sodium chloride 0.9 % 100 mL IVPB  Status:  Discontinued        2 g 200 mL/hr over 30 Minutes Intravenous Every 24 hours 06/30/23 1222 07/02/23 0611   06/30/23 1030  vancomycin (VANCOREADY) IVPB 2000 mg/400 mL        2,000 mg 200 mL/hr over 120 Minutes Intravenous  Once 06/30/23 1022 06/30/23 1338   06/30/23 1015  ceFEPIme (MAXIPIME) 2 g in sodium chloride 0.9 % 100 mL IVPB        2 g 200 mL/hr over 30 Minutes Intravenous  Once 06/30/23 1008 06/30/23 1032   06/30/23 1015  metroNIDAZOLE (FLAGYL) IVPB 500 mg        500 mg 100 mL/hr over 60 Minutes  Intravenous  Once 06/30/23 1008 06/30/23 1132   06/30/23 1015  vancomycin (VANCOCIN) IVPB 1000 mg/200 mL premix  Status:  Discontinued        1,000 mg 200 mL/hr over 60 Minutes Intravenous  Once 06/30/23 1008 06/30/23 1022       REVIEW OF SYSTEMS: pt is a limited historian- sister at bed side Const:  fever,  chills, negative weight loss Eyes: negative diplopia or visual changes, negative eye pain ENT: negative coryza, negative sore throat Resp: negative cough, hemoptysis, dyspnea Cards: negative for chest pain, palpitations, lower extremity edema GU: sister noted blood in his diaper GI: lower abdominal discomfort Skin: negative for rash and pruritus Heme: negative for easy bruising and gum/nose bleeding MS: weakness Neurolo:memory problems Psych: negative for feelings of anxiety, depression  Endocrine: negative for thyroid, diabetes Allergy/Immunology-PCN allergy- says he has taken amoxicillin? Objective:  VITALS:  BP (!) 166/54 (BP Location: Left Arm)   Pulse 66   Temp 98.2 F (36.8 C) (Oral)   Resp 11   Ht 5\' 9"  (1.753 m)   Wt 91.2 kg   SpO2 100%   BMI 29.69 kg/m   PHYSICAL EXAM:  General: Alert, cooperative, no distress, appears stated age.  Head: Normocephalic, without obvious abnormality, atraumatic. Eyes: Conjunctivae clear, anicteric sclerae. Pupils are equal ENT Nares normal. No drainage or sinus tenderness. Lips, mucosa, and tongue normal. No Thrush Neck: Supple, symmetrical, no adenopathy, thyroid: non tender no carotid bruit and no JVD.  Lungs: Clear to auscultation bilaterally. No Wheezing or Rhonchi. No rales. Heart: Regular rate and rhythm, systolic murmur Abdomen: Soft, non-tender,not distended. Bowel sounds normal. No masses Extremities: atraumatic, no cyanosis. No edema. No clubbing Skin: No rashes or lesions. Or bruising Lymph:  Cervical, supraclavicular normal. Neurologic: Grossly non-focal Pertinent Labs Lab Results CBC    Component Value  Date/Time   WBC 7.4 07/02/2023 0717   RBC 2.91 (L) 07/02/2023 0717   HGB 9.4 (L) 07/02/2023 0717   HCT 27.6 (L) 07/02/2023 0717   PLT 161 07/02/2023 0717   MCV 94.8 07/02/2023 0717   MCH 32.3 07/02/2023 0717   MCHC 34.1 07/02/2023 0717   RDW 13.2 07/02/2023 0717   LYMPHSABS 0.5 (L) 06/30/2023 1008   MONOABS 0.3 06/30/2023 1008   EOSABS 0.1 06/30/2023 1008   BASOSABS 0.0 06/30/2023 1008       Latest Ref Rng & Units 07/02/2023    7:17 AM 07/01/2023    5:07 AM 06/30/2023   10:08 AM  CMP  Glucose 70 - 99 mg/dL 82  88  89   BUN 8 - 23 mg/dL 31  29  17    Creatinine 0.61 - 1.24 mg/dL 1.61  0.96  0.45   Sodium 135 - 145 mmol/L 136  134  139   Potassium 3.5 - 5.1 mmol/L 4.2  4.0  3.6   Chloride 98 - 111 mmol/L 104  100  108   CO2 22 - 32 mmol/L 22  22  22    Calcium 8.9 - 10.3 mg/dL 8.5  8.2  6.9   Total Protein 6.5 - 8.1 g/dL   6.0   Total Bilirubin 0.3 - 1.2 mg/dL   0.5   Alkaline Phos 38 - 126 U/L   57   AST 15 - 41 U/L   18   ALT 0 - 44 U/L   9       Microbiology: Recent Results (from the past 240 hour(s))  Resp panel by RT-PCR (RSV, Flu A&B, Covid) Anterior Nasal Swab     Status: None   Collection Time: 06/30/23 10:08 AM   Specimen: Anterior Nasal Swab  Result Value Ref Range Status   SARS Coronavirus 2 by RT PCR NEGATIVE NEGATIVE Final    Comment: (NOTE) SARS-CoV-2 target nucleic acids are NOT DETECTED.  The SARS-CoV-2 RNA is generally detectable in upper respiratory specimens during the acute phase of infection. The lowest concentration of SARS-CoV-2 viral copies this assay can detect is 138 copies/mL. A negative result does not preclude SARS-Cov-2 infection and should not be used as the sole basis for treatment or other patient management decisions. A negative result may occur with  improper specimen collection/handling, submission of specimen other than nasopharyngeal swab, presence of viral mutation(s) within the areas targeted by this assay, and inadequate  number of viral copies(<138 copies/mL). A negative result must be combined with clinical observations, patient history, and epidemiological information. The expected result is Negative.  Fact Sheet for Patients:  BloggerCourse.com  Fact Sheet for Healthcare Providers:  SeriousBroker.it  This test is no t yet approved or cleared by the [place] FDA and  has been authorized for detection and/or diagnosis of SARS-CoV-2 by FDA under an Emergency Use Authorization (EUA). This EUA will remain  in effect (meaning this test can be used) for the duration of the COVID-19 declaration under Section 564(b)(1) of the Act, 21 U.S.C.section 360bbb-3(b)(1), unless the authorization is terminated  or revoked sooner.       Influenza A by PCR NEGATIVE NEGATIVE Final   Influenza B by PCR NEGATIVE NEGATIVE Final    Comment: (NOTE) The Xpert Xpress SARS-CoV-2/FLU/RSV plus assay is intended as an aid in the diagnosis of influenza from Nasopharyngeal swab specimens and should not be  used as a sole basis for treatment. Nasal washings and aspirates are unacceptable for Xpert Xpress SARS-CoV-2/FLU/RSV testing.  Fact Sheet for Patients: BloggerCourse.com  Fact Sheet for Healthcare Providers: SeriousBroker.it  This test is not yet approved or cleared by the [place] FDA and has been authorized for detection and/or diagnosis of SARS-CoV-2 by FDA under an Emergency Use Authorization (EUA). This EUA will remain in effect (meaning this test can be used) for the duration of the COVID-19 declaration under Section 564(b)(1) of the Act, 21 U.S.C. section 360bbb-3(b)(1), unless the authorization is terminated or revoked.     Resp Syncytial Virus by PCR NEGATIVE NEGATIVE Final    Comment: (NOTE) Fact Sheet for Patients: BloggerCourse.com  Fact Sheet for Healthcare  Providers: SeriousBroker.it  This test is not yet approved or cleared by the [place] FDA and has been authorized for detection and/or diagnosis of SARS-CoV-2 by FDA under an Emergency Use Authorization (EUA). This EUA will remain in effect (meaning this test can be used) for the duration of the COVID-19 declaration under Section 564(b)(1) of the Act, 21 U.S.C. section 360bbb-3(b)(1), unless the authorization is terminated or revoked.  Performed at [facility], [address] [place], [place] [address]   Blood Culture (routine x 2)     Status: None (Preliminary result)   Collection Time: 06/30/23 10:27 AM   Specimen: BLOOD  Result Value Ref Range Status   Specimen Description   Final    BLOOD BLOOD LEFT ARM Performed at [facility], [address], [place], [place] [address]    Special Requests   Final    BOTTLES DRAWN AEROBIC AND ANAEROBIC Blood Culture adequate volume Performed at [facility], [address] [place], [place] [address]    Culture  Setup Time   Final    GRAM POSITIVE COCCI AEROBIC BOTTLE ONLY Organism ID to follow CRITICAL RESULT CALLED TO, READ BACK BY AND VERIFIED WITH: JASON ROBBINS @ 0604 07/01/23 LFD GRAM STAIN REVIEWED-AGREE WITH RESULT Performed at [facility], [address] [place], [place] [address]    Culture GRAM POSITIVE COCCI  Final   Report Status PENDING  Incomplete  Blood Culture (routine x 2)     Status: None (Preliminary result)   Collection Time: 06/30/23 10:27 AM   Specimen: BLOOD  Result Value Ref Range Status   Specimen Description BLOOD LEFT ANTECUBITAL  Final   Special Requests   Final    BOTTLES DRAWN AEROBIC AND ANAEROBIC Blood Culture adequate volume   Culture   Final    NO GROWTH 2 DAYS Performed at [facility], [address], [place], [place] [address]    Report Status PENDING  Incomplete  Urine Culture     Status: Abnormal   Collection  Time: 06/30/23 10:27 AM   Specimen: Urine, Random  Result Value Ref Range Status   Specimen Description   Final    URINE, RANDOM Performed at [facility], [address], [place], [place] [address]    Special Requests   Final    NONE Reflexed from [id number] Performed at [facility], [address] [place], [place] [address]    Culture >=100,000 COLONIES/mL ENTEROCOCCUS FAECALIS (A)  Final   Report Status 07/02/2023 FINAL  Final   Organism ID, Bacteria ENTEROCOCCUS FAECALIS (A)  Final      Susceptibility   Enterococcus faecalis - MIC*    AMPICILLIN <=2 SENSITIVE Sensitive     NITROFURANTOIN <=16 SENSITIVE Sensitive  VANCOMYCIN <=0.5 SENSITIVE Sensitive     * >=100,000 COLONIES/mL ENTEROCOCCUS FAECALIS  Blood Culture ID Panel (Reflexed)     Status: Abnormal   Collection Time: 06/30/23 10:27 AM  Result Value Ref Range Status   Enterococcus faecalis DETECTED (A) NOT DETECTED Final    Comment: CRITICAL RESULT CALLED TO, READ BACK BY AND VERIFIED WITH: JASON ROBBINS @ 0604 07/01/23 LFD    Enterococcus Faecium NOT DETECTED NOT DETECTED Final   Listeria monocytogenes NOT DETECTED NOT DETECTED Final   Staphylococcus species NOT DETECTED NOT DETECTED Final   Staphylococcus aureus (BCID) NOT DETECTED NOT DETECTED Final   Staphylococcus epidermidis NOT DETECTED NOT DETECTED Final   Staphylococcus lugdunensis NOT DETECTED NOT DETECTED Final   Streptococcus species NOT DETECTED NOT DETECTED Final   Streptococcus agalactiae NOT DETECTED NOT DETECTED Final   Streptococcus pneumoniae NOT DETECTED NOT DETECTED Final   Streptococcus pyogenes NOT DETECTED NOT DETECTED Final   A.calcoaceticus-baumannii NOT DETECTED NOT DETECTED Final   Bacteroides fragilis NOT DETECTED NOT DETECTED Final   Enterobacterales NOT DETECTED NOT DETECTED Final   Enterobacter cloacae complex NOT DETECTED NOT DETECTED Final   Escherichia coli NOT DETECTED NOT DETECTED Final   Klebsiella aerogenes  NOT DETECTED NOT DETECTED Final   Klebsiella oxytoca NOT DETECTED NOT DETECTED Final   Klebsiella pneumoniae NOT DETECTED NOT DETECTED Final   Proteus species NOT DETECTED NOT DETECTED Final   Salmonella species NOT DETECTED NOT DETECTED Final   Serratia marcescens NOT DETECTED NOT DETECTED Final   Haemophilus influenzae NOT DETECTED NOT DETECTED Final   Neisseria meningitidis NOT DETECTED NOT DETECTED Final   Pseudomonas aeruginosa NOT DETECTED NOT DETECTED Final   Stenotrophomonas maltophilia NOT DETECTED NOT DETECTED Final   Candida albicans NOT DETECTED NOT DETECTED Final   Candida auris NOT DETECTED NOT DETECTED Final   Candida glabrata NOT DETECTED NOT DETECTED Final   Candida krusei NOT DETECTED NOT DETECTED Final   Candida parapsilosis NOT DETECTED NOT DETECTED Final   Candida tropicalis NOT DETECTED NOT DETECTED Final   Cryptococcus neoformans/gattii NOT DETECTED NOT DETECTED Final   Vancomycin resistance NOT DETECTED NOT DETECTED Final    Comment: Performed at [facility], [address] [place], [place] [address]    IMAGING RESULTS: CXR- low lung volumes  CT head- advanced chronic small vessel disease I have personally reviewed the films ? Impression/Recommendation ? ?Enterococcus fecalis bacteremia The bacteria is present in the urine as well As he is on dialysys need to r/o endocarditis Will get 2 d echo Will get bladder scan to look for residue US renal to look for any hydronephrosis, renal stone  Dementia  ESRD on dialysis  H/o VCA Chronic CHF  Anemia   DM - management as per primary team ? ___________________________________________________ Discussed with sister and requesting provider Note:  This document was prepared using Dragon voice recognition software and may include unintentional dictation errors.

## 2023-07-03 ENCOUNTER — Inpatient Hospital Stay: Payer: Medicare HMO

## 2023-07-03 ENCOUNTER — Inpatient Hospital Stay: Admit: 2023-07-03 | Payer: Medicare HMO

## 2023-07-03 DIAGNOSIS — B952 Enterococcus as the cause of diseases classified elsewhere: Secondary | ICD-10-CM | POA: Diagnosis not present

## 2023-07-03 DIAGNOSIS — R6520 Severe sepsis without septic shock: Secondary | ICD-10-CM | POA: Diagnosis not present

## 2023-07-03 DIAGNOSIS — R7881 Bacteremia: Secondary | ICD-10-CM

## 2023-07-03 DIAGNOSIS — A419 Sepsis, unspecified organism: Secondary | ICD-10-CM | POA: Diagnosis not present

## 2023-07-03 LAB — HEPATITIS B SURFACE ANTIBODY, QUANTITATIVE: Hep B S AB Quant (Post): 1548 m[IU]/mL

## 2023-07-03 LAB — ECHOCARDIOGRAM COMPLETE
AR max vel: 1.94 cm2
AV Area VTI: 2.12 cm2
AV Area mean vel: 1.85 cm2
AV Mean grad: 5.7 mmHg
AV Peak grad: 10.2 mmHg
Ao pk vel: 1.59 m/s
Area-P 1/2: 2.5 cm2
Height: 69 in
MV VTI: 2.03 cm2
S' Lateral: 2.8 cm
Weight: 3216.95 oz

## 2023-07-03 LAB — GLUCOSE, CAPILLARY: Glucose-Capillary: 120 mg/dL — ABNORMAL HIGH (ref 70–99)

## 2023-07-03 NOTE — Progress Notes (Signed)
Patient refused bladder scan, MD notified

## 2023-07-03 NOTE — Progress Notes (Signed)
PT Cancellation Note  Patient Details Name: Randall [name] MRN: [id number] DOB: Mar 16, 1961   Cancelled Treatment:    Reason Eval/Treat Not Completed: Fatigue/lethargy limiting ability to participate (Consult received and chart reviewed.  Patient sleeping soundly; awakens brielfy to max cuing, but unable to maintain level alertness for participation with session. RN informed/aware. Will continue to follow and initiate as appropriate.)   [name] H. Manson Passey, PT, DPT, NCS 07/03/23, 3:25 PM [phone]

## 2023-07-03 NOTE — Evaluation (Signed)
Occupational Therapy Evaluation Patient Details Name: Randall [name] MRN: [id number] DOB: Jan 22, 1961 Today's Date: 07/03/2023   History of Present Illness Randall [name] is a 62 y.o. male with medical history significant of ESRD on HD MWF, chronic HFpEF, HTN, advanced dementia, BPH, brought in by caretaker for evaluation of confusion and possible UTI.     Patient has advanced dementia and unable to provide much of the history, most of the history provided by sister/caregiver at bedside.  Symptoms started yesterday evening after patient had a normal routine dialysis.  When sister changed patient underwear, she found some blood staining on his underwear, and patient became somewhat confused in the evening.  This morning patient became more confused and unable to communicate with his sister which is abnormal.  No fever as per sister.   Clinical Impression   Patient received for OT evaluation. See flowsheet below for details of function. Generally, patient requiring supervision/MIN A for bed mobility, CGA with RW (4 ft sidesteps) for functional mobility, and set up-MAX A for ADLs. Patient will benefit from continued OT while in acute care.       If plan is discharge home, recommend the following: A little help with walking and/or transfers;A lot of help with bathing/dressing/bathroom;Assistance with cooking/housework;Direct supervision/assist for medications management;Direct supervision/assist for financial management;Assist for transportation;Help with stairs or ramp for entrance;Supervision due to cognitive status    Functional Status Assessment  Patient has had a recent decline in their functional status and demonstrates the ability to make significant improvements in function in a reasonable and predictable amount of time.  Equipment Recommendations  None recommended by OT    Recommendations for Other Services       Precautions / Restrictions Precautions Precautions:  Fall Restrictions Weight Bearing Restrictions: No      Mobility Bed Mobility Overal bed mobility: Needs Assistance Bed Mobility: Supine to Sit, Sit to Supine     Supine to sit: Supervision, HOB elevated, Used rails (cues) Sit to supine: Min assist   General bed mobility comments: OT assisted pt with repositioning in bed, as he was unable to sequence through repositioning with cues.    Transfers Overall transfer level: Needs assistance Equipment used: Rolling walker (2 wheels) Transfers: Sit to/from Stand Sit to Stand: Contact guard assist, From elevated surface           General transfer comment: from EOB      Balance Overall balance assessment: Mild deficits observed, not formally tested                                         ADL either performed or assessed with clinical judgement   ADL Overall ADL's : At baseline;Needs assistance/impaired Eating/Feeding: Supervision/ safety;Set up;Bed level Eating/Feeding Details (indicate cue type and reason): Pt eating scrambled eggs with poor posture at bed level when OT arrived. Seated at EOB, pt able to eat a few more bites and take medication, however posterior lean noted after a few minutes of pt fatiguing.             Upper Body Dressing : Moderate assistance Upper Body Dressing Details (indicate cue type and reason): assisted to change gown, as original gown was wet. Pt having difficulty with finding proper holes for arms in gown; needing assist.                   General  ADL Comments: Pt requires significant assist for ADLs at basline; anticipate pt is at/near his functional baseline based on session today; will benefit from working with PT on further mobility.     Vision Baseline Vision/History: 1 Wears glasses (wearing glasses during session)       Perception         Praxis         Pertinent Vitals/Pain Pain Assessment Pain Assessment: No/denies pain     Extremity/Trunk  Assessment Upper Extremity Assessment Upper Extremity Assessment: Generalized weakness (Unable to fully flex BIL shoulders through ROM. Was able to reach up to "high five" OT at approx 90 degrees shoulder flexion. Able to self-feed slowly during session.)   Lower Extremity Assessment Lower Extremity Assessment: Generalized weakness;Defer to PT evaluation       Communication Communication Communication: Difficulty communicating thoughts/reduced clarity of speech Cueing Techniques: Verbal cues;Gestural cues;Tactile cues;Visual cues   Cognition Arousal: Alert Behavior During Therapy: WFL for tasks assessed/performed Overall Cognitive Status: History of cognitive impairments - at baseline                                 General Comments: Did not explicitly ask orientation questions. Pt is able to state that his sister is at bedside and he lives with her. Pt able to follow commands with visual, verbal, and tactile cues     General Comments  Pt able to side step approx 4 feet at edge of bed (towards L) with OT cues and CGA at RW; pt moving slowly.    Exercises     Shoulder Instructions      Home Living Family/patient expects to be discharged to:: Private residence Living Arrangements: Other relatives (pt lives at sister's house) Available Help at Discharge: Family;Available 24 hours/day Type of Home: House Home Access: Stairs to enter [employer] of Steps: 4 Entrance Stairs-Rails: None Home Layout: One level     Bathroom Shower/Tub: Chief Strategy Officer: Standard (with BSC over toilet)     Home Equipment: Tub bench;Hand held shower head;BSC/3in1;Rolling Walker (2 wheels);Transport chair   Additional Comments: All history provided by pt's sister at bedside, as pt has dementia and was providing incorrect information upon OT asking him. Sister is retired and IT trainer.      Prior Functioning/Environment Prior Level of Function : Needs  assist             Mobility Comments: Sister states that pt walks in the home without AD; when going out to HD pt uses RW. Recent difficulty with navigating stairs to enter. ADLs Comments: Sister states she assists with dressing, grooming (sister helps clean pt's dentures), bathing (sometimes sponge bathing and sometimes showering with tub transfer bench into shower; whichever is feasible that day). Pt is typically able to eat and take himself to the bathroom without assistance. Sister assists with all IADLs, including driving, medication management, household chores, and food preparation. Pt enjoys watching chef shows and Gunsmoke. Pt goes to HD MWF. Has lived with the sister for "about a year or two" per sister.        OT Problem List: Decreased strength;Decreased activity tolerance;Impaired balance (sitting and/or standing)      OT Treatment/Interventions: Self-care/ADL training;Therapeutic exercise;Therapeutic activities;Patient/family education    OT Goals(Current goals can be found in the care plan section) Acute Rehab OT Goals Patient Stated Goal: Pt did not state OT Goal Formulation: With family Time  For Goal Achievement: 07/17/23 Potential to Achieve Goals: Good ADL Goals Pt Will Perform Upper Body Dressing: with min assist;sitting Pt Will Transfer to Toilet: with modified independence;bedside commode;ambulating Pt Will Perform Toileting - Clothing Manipulation and hygiene: with modified independence;sit to/from stand  OT Frequency: Min 1X/week    Co-evaluation              AM-PAC OT "6 Clicks" Daily Activity     Outcome Measure Help from another person eating meals?: None Help from another person taking care of personal grooming?: A Little Help from another person toileting, which includes using toliet, bedpan, or urinal?: A Little Help from another person bathing (including washing, rinsing, drying)?: A Lot Help from another person to put on and taking off regular  upper body clothing?: A Little Help from another person to put on and taking off regular lower body clothing?: A Lot 6 Click Score: 17   End of Session Equipment Utilized During Treatment: Gait belt;Rolling walker (2 wheels) Nurse Communication: Mobility status  Activity Tolerance: Patient tolerated treatment well Patient left: in bed;with call bell/phone within reach;with bed alarm set;with family/visitor present  OT Visit Diagnosis: Unsteadiness on feet (R26.81)                Time: 4098-1191 OT Time Calculation (min): 32 min Charges:  OT General Charges $OT Visit: 1 Visit OT Evaluation $OT Eval Moderate Complexity: 1 Mod OT Treatments $Therapeutic Activity: 8-22 mins  Linward Foster, MS, OTR/L  Alvester Morin 07/03/2023, 12:05 PM

## 2023-07-03 NOTE — Progress Notes (Signed)
Pharmacy Antibiotic Note  Randall [name] is a 62 y.o. male admitted on 06/30/2023 with sepsis secondary to an acute UTI. Patient was brought in by caretaker for evaluation of confusion. PMH is significant for advanced dementia, ESRD on HD MWF, chronic HFpEF, HTN, stroke, & BPH. E. faecalis growing in 1 of 4 bottles, no resistance detected. Pharmacy has been consulted for vancomycin dosing.  Plan: Continue vancomycin 1 gm QHD Will check vancomycin random 8/28 with AM labs Pharmacy will continue to monitor and dose adjust appropriately  Height: 5\' 9"  (175.3 cm) Weight: 91.2 kg (201 lb 1 oz) IBW/kg (Calculated) : 70.7  Temp (24hrs), Avg:98.6 F (37 C), Min:98.1 F (36.7 C), Max:99.5 F (37.5 C)  Recent Labs  Lab 06/30/23 1008 06/30/23 1027 07/01/23 0507 07/02/23 0717  WBC 8.6  --   --  7.4  CREATININE 2.78*  --  4.05* 4.27*  LATICACIDVEN 2.8* 1.1  --   --     Estimated Creatinine Clearance: 20.3 mL/min (A) (by C-G formula based on SCr of 4.27 mg/dL (H)).    Allergies  Allergen Reactions   Penicillins     Reaction in childhood - unknown reaction Patient states he has tolerated amoxicillin.      Antimicrobials this admission: Vancomycin 8/24 >>   Dose adjustments this admission:  Microbiology results: 8/24 Ucx: E. Faecalis (S vanc, ampicillin) 8/24 Bcx: 1/4 E. faecalis  Thank you for allowing pharmacy to be a part of this patient's care.  Littie Deeds, PharmD PGY1 Pharmacy Resident 07/03/2023 4:15 PM

## 2023-07-03 NOTE — Discharge Planning (Signed)
ESTABLISHED HEMODIALYSIS  Outpatient Facility  [facility] [address] [place], [place] [address] [phone]  Scheduled Days: Monday Wednesday and Friday  Treatment Time: 10:30am  Dimas Chyle Dialysis Coordinator II  [facility] Cell: [phone] eFax: [phone] [email or web address]

## 2023-07-03 NOTE — Progress Notes (Signed)
[facility]  ROUNDING NOTE   Subjective:   Randall [name]  is a 63 year old male with past medical conditions including hypertension, type 2 diabetes, Bell's palsy, CVA, CHF, hyperlipidemia, and end-stage renal disease on hemodialysis.  Patient presents to the emergency department complaining of weakness and confusion.  Patient has been admitted under observation Encephalopathy [G93.40] Acute cystitis with hematuria [N30.01] Altered mental status, unspecified altered mental status type [R41.82] Sepsis, due to unspecified organism, unspecified whether acute organ dysfunction present [facility]) [A41.9]  Patient is known to our practice and receives outpatient dialysis treatments at [facility] on a MWF schedule, supervised by Dr. Cherylann Ratel. Patient is a poor historian and chart review used for this visit.   Patient seen sitting up in bed Alert and oriented to self  Going to renal ultrasound    Objective:  Vital signs in last 24 hours:  Temp:  [98.1 F (36.7 C)-99.5 F (37.5 C)] 98.1 F (36.7 C) (08/27 0803) Pulse Rate:  [59-67] 59 (08/27 0803) Resp:  [11-19] 17 (08/27 0803) BP: (130-179)/(50-63) 167/63 (08/27 0803) SpO2:  [98 %-100 %] 99 % (08/27 0803) Weight:  [91.2 kg] 91.2 kg (08/26 1245)  Weight change:  Filed Weights   06/30/23 1450 07/02/23 0856 07/02/23 1245  Weight: 94.1 kg 92.7 kg 91.2 kg    Intake/Output: I/O last 3 completed shifts: In: 120 [P.O.:120] Out: 2000 [Other:2000]   Intake/Output this shift:  No intake/output data recorded.  Physical Exam: General: NAD  Head: Normocephalic, atraumatic. Moist oral mucosal membranes  Eyes: Anicteric  Lungs:  Clear to auscultation, normal effort  Heart: Regular rate and rhythm  Abdomen:  Soft, nontender  Extremities:  No peripheral edema.  Neurologic: Nonfocal, moving all four extremities  Skin: No lesions  Access: Rt AVF    Basic Metabolic Panel: Recent Labs  Lab 06/30/23 1008 07/01/23 0507  07/02/23 0717  NA 139 134* 136  K 3.6 4.0 4.2  CL 108 100 104  CO2 22 22 22   GLUCOSE 89 88 82  BUN 17 29* 31*  CREATININE 2.78* 4.05* 4.27*  CALCIUM 6.9* 8.2* 8.5*  PHOS  --   --  3.2    Liver Function Tests: Recent Labs  Lab 06/30/23 1008 07/02/23 0717  AST 18  --   ALT 9  --   ALKPHOS 57  --   BILITOT 0.5  --   PROT 6.0*  --   ALBUMIN 2.9* 2.8*   No results for input(s): "LIPASE", "AMYLASE" in the last 168 hours. No results for input(s): "AMMONIA" in the last 168 hours.  CBC: Recent Labs  Lab 06/30/23 1008 07/02/23 0717  WBC 8.6 7.4  NEUTROABS 7.7  --   HGB 9.9* 9.4*  HCT 31.1* 27.6*  MCV 99.4 94.8  PLT 156 161    Cardiac Enzymes: No results for input(s): "CKTOTAL", "CKMB", "CKMBINDEX", "TROPONINI" in the last 168 hours.  BNP: Invalid input(s): "POCBNP"  CBG: Recent Labs  Lab 06/30/23 1006 06/30/23 1629 07/02/23 2228  GLUCAP 88 144* 145*    Microbiology: Results for orders placed or performed during the hospital encounter of 06/30/23  Resp panel by RT-PCR (RSV, Flu A&B, Covid) Anterior Nasal Swab     Status: None   Collection Time: 06/30/23 10:08 AM   Specimen: Anterior Nasal Swab  Result Value Ref Range Status   SARS Coronavirus 2 by RT PCR NEGATIVE NEGATIVE Final    Comment: (NOTE) SARS-CoV-2 target nucleic acids are NOT DETECTED.  The SARS-CoV-2 RNA is generally  detectable in upper respiratory specimens during the acute phase of infection. The lowest concentration of SARS-CoV-2 viral copies this assay can detect is 138 copies/mL. A negative result does not preclude SARS-Cov-2 infection and should not be used as the sole basis for treatment or other patient management decisions. A negative result may occur with  improper specimen collection/handling, submission of specimen other than nasopharyngeal swab, presence of viral mutation(s) within the areas targeted by this assay, and inadequate number of viral copies(<138 copies/mL). A negative  result must be combined with clinical observations, patient history, and epidemiological information. The expected result is Negative.  Fact Sheet for Patients:  BloggerCourse.com  Fact Sheet for Healthcare Providers:  SeriousBroker.it  This test is no t yet approved or cleared by the [place] FDA and  has been authorized for detection and/or diagnosis of SARS-CoV-2 by FDA under an Emergency Use Authorization (EUA). This EUA will remain  in effect (meaning this test can be used) for the duration of the COVID-19 declaration under Section 564(b)(1) of the Act, 21 U.S.C.section 360bbb-3(b)(1), unless the authorization is terminated  or revoked sooner.       Influenza A by PCR NEGATIVE NEGATIVE Final   Influenza B by PCR NEGATIVE NEGATIVE Final    Comment: (NOTE) The Xpert Xpress SARS-CoV-2/FLU/RSV plus assay is intended as an aid in the diagnosis of influenza from Nasopharyngeal swab specimens and should not be used as a sole basis for treatment. Nasal washings and aspirates are unacceptable for Xpert Xpress SARS-CoV-2/FLU/RSV testing.  Fact Sheet for Patients: BloggerCourse.com  Fact Sheet for Healthcare Providers: SeriousBroker.it  This test is not yet approved or cleared by the [place] FDA and has been authorized for detection and/or diagnosis of SARS-CoV-2 by FDA under an Emergency Use Authorization (EUA). This EUA will remain in effect (meaning this test can be used) for the duration of the COVID-19 declaration under Section 564(b)(1) of the Act, 21 U.S.C. section 360bbb-3(b)(1), unless the authorization is terminated or revoked.     Resp Syncytial Virus by PCR NEGATIVE NEGATIVE Final    Comment: (NOTE) Fact Sheet for Patients: BloggerCourse.com  Fact Sheet for Healthcare Providers: SeriousBroker.it  This  test is not yet approved or cleared by the [place] FDA and has been authorized for detection and/or diagnosis of SARS-CoV-2 by FDA under an Emergency Use Authorization (EUA). This EUA will remain in effect (meaning this test can be used) for the duration of the COVID-19 declaration under Section 564(b)(1) of the Act, 21 U.S.C. section 360bbb-3(b)(1), unless the authorization is terminated or revoked.  Performed at [facility], [address] [place], [place] [address]   Blood Culture (routine x 2)     Status: None (Preliminary result)   Collection Time: 06/30/23 10:27 AM   Specimen: BLOOD  Result Value Ref Range Status   Specimen Description   Final    BLOOD BLOOD LEFT ARM Performed at [facility], [address], [place], [place] [address]    Special Requests   Final    BOTTLES DRAWN AEROBIC AND ANAEROBIC Blood Culture adequate volume Performed at [facility], [address] [place], [place] [address]    Culture  Setup Time   Final    GRAM POSITIVE COCCI AEROBIC BOTTLE ONLY Organism ID to follow CRITICAL RESULT CALLED TO, READ BACK BY AND VERIFIED WITH: JASON ROBBINS @ 0604 07/01/23 LFD GRAM STAIN REVIEWED-AGREE WITH RESULT Performed at [facility], [address], [place], [place] [address]  Culture GRAM POSITIVE COCCI  Final   Report Status PENDING  Incomplete  Blood Culture (routine x 2)     Status: None (Preliminary result)   Collection Time: 06/30/23 10:27 AM   Specimen: BLOOD  Result Value Ref Range Status   Specimen Description BLOOD LEFT ANTECUBITAL  Final   Special Requests   Final    BOTTLES DRAWN AEROBIC AND ANAEROBIC Blood Culture adequate volume   Culture   Final    NO GROWTH 3 DAYS Performed at [facility], [address] [place], [place] [address]    Report Status PENDING  Incomplete  Urine Culture     Status: Abnormal   Collection Time: 06/30/23 10:27 AM   Specimen: Urine, Random  Result  Value Ref Range Status   Specimen Description   Final    URINE, RANDOM Performed at [facility], [address] [place], [place] [address]    Special Requests   Final    NONE Reflexed from [phone] Performed at [facility], [address] [place], [place] [address]    Culture >=100,000 COLONIES/mL ENTEROCOCCUS FAECALIS (A)  Final   Report Status 07/02/2023 FINAL  Final   Organism ID, Bacteria ENTEROCOCCUS FAECALIS (A)  Final      Susceptibility   Enterococcus faecalis - MIC*    AMPICILLIN <=2 SENSITIVE Sensitive     NITROFURANTOIN <=16 SENSITIVE Sensitive     VANCOMYCIN <=0.5 SENSITIVE Sensitive     * >=100,000 COLONIES/mL ENTEROCOCCUS FAECALIS  Blood Culture ID Panel (Reflexed)     Status: Abnormal   Collection Time: 06/30/23 10:27 AM  Result Value Ref Range Status   Enterococcus faecalis DETECTED (A) NOT DETECTED Final    Comment: CRITICAL RESULT CALLED TO, READ BACK BY AND VERIFIED WITH: JASON ROBBINS @ 0604 07/01/23 LFD    Enterococcus Faecium NOT DETECTED NOT DETECTED Final   Listeria monocytogenes NOT DETECTED NOT DETECTED Final   Staphylococcus species NOT DETECTED NOT DETECTED Final   Staphylococcus aureus (BCID) NOT DETECTED NOT DETECTED Final   Staphylococcus epidermidis NOT DETECTED NOT DETECTED Final   Staphylococcus lugdunensis NOT DETECTED NOT DETECTED Final   Streptococcus species NOT DETECTED NOT DETECTED Final   Streptococcus agalactiae NOT DETECTED NOT DETECTED Final   Streptococcus pneumoniae NOT DETECTED NOT DETECTED Final   Streptococcus pyogenes NOT DETECTED NOT DETECTED Final   A.calcoaceticus-baumannii NOT DETECTED NOT DETECTED Final   Bacteroides fragilis NOT DETECTED NOT DETECTED Final   Enterobacterales NOT DETECTED NOT DETECTED Final   Enterobacter cloacae complex NOT DETECTED NOT DETECTED Final   Escherichia coli NOT DETECTED NOT DETECTED Final   Klebsiella aerogenes NOT DETECTED NOT DETECTED Final   Klebsiella oxytoca NOT  DETECTED NOT DETECTED Final   Klebsiella pneumoniae NOT DETECTED NOT DETECTED Final   Proteus species NOT DETECTED NOT DETECTED Final   Salmonella species NOT DETECTED NOT DETECTED Final   Serratia marcescens NOT DETECTED NOT DETECTED Final   Haemophilus influenzae NOT DETECTED NOT DETECTED Final   Neisseria meningitidis NOT DETECTED NOT DETECTED Final   Pseudomonas aeruginosa NOT DETECTED NOT DETECTED Final   Stenotrophomonas maltophilia NOT DETECTED NOT DETECTED Final   Candida albicans NOT DETECTED NOT DETECTED Final   Candida auris NOT DETECTED NOT DETECTED Final   Candida glabrata NOT DETECTED NOT DETECTED Final   Candida krusei NOT DETECTED NOT DETECTED Final   Candida parapsilosis NOT DETECTED NOT DETECTED Final   Candida tropicalis NOT DETECTED NOT DETECTED Final   Cryptococcus neoformans/gattii NOT DETECTED NOT DETECTED  Final   Vancomycin resistance NOT DETECTED NOT DETECTED Final    Comment: Performed at [facility], [address] [place], [place] [address]    Coagulation Studies: No results for input(s): "LABPROT", "INR" in the last 72 hours.   Urinalysis: Recent Labs    06/30/23 1027  COLORURINE YELLOW*  LABSPEC 1.015  PHURINE 7.0  GLUCOSEU NEGATIVE  HGBUR SMALL*  BILIRUBINUR NEGATIVE  KETONESUR NEGATIVE  PROTEINUR >=300*  NITRITE NEGATIVE  LEUKOCYTESUR LARGE*      Imaging: No results found.   Medications:      aspirin EC  81 mg Oral Daily   carvedilol  25 mg Oral BID WC   heparin  5,000 Units Subcutaneous Q12H   pantoprazole  20 mg Oral Daily   rosuvastatin  10 mg Oral QPM   sertraline  100 mg Oral Daily   tamsulosin  0.4 mg Oral QHS   vancomycin variable dose per unstable renal function (pharmacist dosing)   Does not apply See admin instructions   ondansetron **OR** ondansetron (ZOFRAN) IV  Assessment/ Plan:  Randall [name] is a 62 y.o.  male past medical conditions including hypertension, type 2 diabetes, Bell's palsy,  CVA, CHF, hyperlipidemia, and end-stage renal disease on hemodialysis.  Patient presents to the emergency department complaining of weakness and confusion.  Patient has been admitted under observation Encephalopathy [G93.40] Acute cystitis with hematuria [N30.01] Altered mental status, unspecified altered mental status type [R41.82] Sepsis, due to unspecified organism, unspecified whether acute organ dysfunction present (HCC) [A41.9]   Sepsis secondary due to UTI. Urine and blood culture growing Enterococcus faecalis. IV cefriaxone changed to vancomycin.   2. End stage renal disease on hemodialysis. Dialysis received yesterday, UF 2L achieved.  Next treatment scheduled for Wednesday  3. Anemia of chronic kidney disease Lab Results  Component Value Date   HGB 9.4 (L) 07/02/2023    Hgb within desired range for renal patient, 9-11. Patient receives Mircera at outpatient clinic.   4. Hypertension with chronic kidney disease. Home regimen includes carvedilol. Also on tamsulosin. Receiving these medications.   Blood pressure slightly elevated. Continue current regimen.   5. Secondary Hyperparathyroidism: with outpatient labs: none available  Lab Results  Component Value Date   CALCIUM 8.5 (L) 07/02/2023   CAION 1.19 06/15/2022   PHOS 3.2 07/02/2023    Patient receives cholecalciferol and sevelamer outpatient.Bone minerals acceptable.    LOS: 1   8/27/202410:16 AM

## 2023-07-03 NOTE — Progress Notes (Signed)
[facility]  -  at [facility]   PATIENT NAME: Randall [name]    MR#:  [id number]  DATE OF BIRTH:  1961-07-26  SUBJECTIVE:  patient seen earlier. No family at bedside. Apparently came in with confusion and is being treated for positive blood cultures. Patient has advanced dementia unable to provide much of history.   VITALS:  Blood pressure (!) 167/63, pulse (!) 59, temperature 98.1 F (36.7 C), temperature source Oral, resp. rate 17, height 5\' 9"  (1.753 m), weight 91.2 kg, SpO2 99%.  PHYSICAL EXAMINATION:  Limited GENERAL:  62 y.o.-year-old patient with no acute distress.  LUNGS: Normal breath sounds bilaterally, no wheezing CARDIOVASCULAR: S1, S2 normal. No murmur   ABDOMEN: Soft, nontender, nondistended. Bowel sounds present.  EXTREMITIES: No  edema b/l.    NEUROLOGIC: nonfocal  patient is awake  LABORATORY PANEL:  CBC Recent Labs  Lab 07/02/23 0717  WBC 7.4  HGB 9.4*  HCT 27.6*  PLT 161    Chemistries  Recent Labs  Lab 06/30/23 1008 07/01/23 0507 07/02/23 0717  NA 139   < > 136  K 3.6   < > 4.2  CL 108   < > 104  CO2 22   < > 22  GLUCOSE 89   < > 82  BUN 17   < > 31*  CREATININE 2.78*   < > 4.27*  CALCIUM 6.9*   < > 8.5*  AST 18  --   --   ALT 9  --   --   ALKPHOS 57  --   --   BILITOT 0.5  --   --    < > = values in this interval not displayed.   Assessment and Plan  Randall [name] is a 62 y.o. male  with medical history significant of ESRD on HD MWF, chronic HFpEF, HTN, advanced dementia, BPH, stroke, brought in by caretaker for evaluation of confusion and possible UTI.    Apparently, he became more confused after he had his routine hemodialysis on 06/29/2023.  His sister noticed blood stain in his underwear when he was changing the underwear.Pt was febrile in the ED with temperature 103.4 F and tachypneic with respiratory rate of 22.  He was admitted to the hospital for severe sepsis secondary to acute UTI.   Severe  Sepsis secondary to acute UTI:  --Lactic acid was 2.8 on admission.   --BC 1/2 Enterococcus faecalis --Urine culture is growing Enterococcus faecalis.   -- ID consultation  appreciated--recommends TTE, Bladder scan and PVR  --Patient currently on IV vancomycin -- patient afebrile. White count 7.4. -- Chest x-ray negative for pneumonia --TTE --EF 60%  --Renal USG-- nothing acute --Bladder scan 230 cc --pt did not allow I and out cath   ESRD on HD: Consulted Dr. Wynelle Link, nephrologist, for inhouse  hemodialysis.   Chronic diastolic CHF: Compensated   Acute metabolic encephalopathy on underlying dementia: He appears to be at baseline.  Continue supportive care.    BPH --cont flomax  Hypertension --cont coreg   history of stroke on aspirin +statin  As baseline- per sister who lives with patient at home patient ambulates using walker. Sister transport  pt to dialysis back-and-forth.  Await PT/to see  Procedures: Family communication :sister Chyrl Civatte on 8/26 Consults : infectious disease, nephrology CODE STATUS: DNR DVT Prophylaxis : heparin Level of care: Med-Surg  The patient will require care spanning > 2 midnights and should be moved to inpatient because: Sepsis  TOTAL TIME TAKING CARE OF THIS PATIENT: 35 minutes.  >50% time spent on counselling and coordination of care  Note: This dictation was prepared with Dragon dictation along with smaller phrase technology. Any transcriptional errors that result from this process are unintentional.  Enedina Finner M.D    [facility]   CC: Primary care physician; Gabriel Earing, MD

## 2023-07-03 NOTE — Progress Notes (Addendum)
Pts bladder scan is . MD notified and orders to I and o Cath but pt refused. MD notified. Pt also removed his piv.

## 2023-07-03 NOTE — Progress Notes (Signed)
Date of Admission:  06/30/2023   Total days of antibiotics ***        Day ***        Day ***        Day ***   ID: Randall [name] is a 62 y.o. male with  *** Principal Problem:   Severe sepsis (HCC) Active Problems:   UTI (urinary tract infection)   ESRD on dialysis (HCC)   Acute encephalopathy   Encephalopathy    Subjective: ***  Medications:   aspirin EC  81 mg Oral Daily   carvedilol  25 mg Oral BID WC   heparin  5,000 Units Subcutaneous Q12H   pantoprazole  20 mg Oral Daily   rosuvastatin  10 mg Oral QPM   sertraline  100 mg Oral Daily   tamsulosin  0.4 mg Oral QHS   vancomycin variable dose per unstable renal function (pharmacist dosing)   Does not apply See admin instructions    Objective: Vital signs in last 24 hours: Patient Vitals for the past 24 hrs:  BP Temp Temp src Pulse Resp SpO2  07/03/23 0803 (!) 167/63 98.1 F (36.7 C) Oral (!) 59 17 99 %  07/03/23 0526 (!) 179/59 98.1 F (36.7 C) Oral (!) 59 -- 100 %  07/02/23 2226 130/63 99.5 F (37.5 C) Oral 67 -- 100 %     LDA Foley Central lines Other catheters  PHYSICAL EXAM:  General: Alert, cooperative, no distress, appears stated age.  Head: Normocephalic, without obvious abnormality, atraumatic. Eyes: Conjunctivae clear, anicteric sclerae. Pupils are equal ENT Nares normal. No drainage or sinus tenderness. Lips, mucosa, and tongue normal. No Thrush Neck: Supple, symmetrical, no adenopathy, thyroid: non tender no carotid bruit and no JVD. Back: No CVA tenderness. Lungs: Clear to auscultation bilaterally. No Wheezing or Rhonchi. No rales. Heart: Regular rate and rhythm, no murmur, rub or gallop. Abdomen: Soft, non-tender,not distended. Bowel sounds normal. No masses Extremities: atraumatic, no cyanosis. No edema. No clubbing Skin: No rashes or lesions. Or bruising Lymph: Cervical, supraclavicular normal. Neurologic: Grossly non-focal  Lab Results    Latest Ref Rng & Units 07/02/2023     7:17 AM 06/30/2023   10:08 AM 02/08/2023    4:33 AM  CBC  WBC 4.0 - 10.5 K/uL 7.4  8.6  6.7   Hemoglobin 13.0 - 17.0 g/dL 9.4  9.9  16.1   Hematocrit 39.0 - 52.0 % 27.6  31.1  33.9   Platelets 150 - 400 K/uL 161  156  229        Latest Ref Rng & Units 07/02/2023    7:17 AM 07/01/2023    5:07 AM 06/30/2023   10:08 AM  CMP  Glucose 70 - 99 mg/dL 82  88  89   BUN 8 - 23 mg/dL 31  29  17    Creatinine 0.61 - 1.24 mg/dL 0.96  0.45  4.09   Sodium 135 - 145 mmol/L 136  134  139   Potassium 3.5 - 5.1 mmol/L 4.2  4.0  3.6   Chloride 98 - 111 mmol/L 104  100  108   CO2 22 - 32 mmol/L 22  22  22    Calcium 8.9 - 10.3 mg/dL 8.5  8.2  6.9   Total Protein 6.5 - 8.1 g/dL   6.0   Total Bilirubin 0.3 - 1.2 mg/dL   0.5   Alkaline Phos 38 - 126 U/L   57   AST 15 - 41 U/L  18   ALT 0 - 44 U/L   9       Microbiology:  Studies/Results: US RENAL  Result Date: 07/03/2023 CLINICAL DATA:  Hematuria EXAM: RENAL / URINARY TRACT ULTRASOUND COMPLETE COMPARISON:  None Available. FINDINGS: Right Kidney: Renal measurements: 9.0 x 4.7 x 4.5 cm = volume: 98 mL. Echogenicity within normal limits. No mass or hydronephrosis visualized. Left Kidney: Renal measurements: 8.6 x 5.0 x 4.7 cm = volume: 104 mL. Echogenicity within normal limits. No mass or hydronephrosis visualized. Bladder: Appears normal for degree of bladder distention. Other: None. IMPRESSION: No hydronephrosis. Electronically Signed   By: Jearld Lesch M.D.   On: 07/03/2023 13:26   ECHOCARDIOGRAM COMPLETE  Result Date: 07/03/2023    ECHOCARDIOGRAM REPORT   Patient Name:   Randall [name] Date of Exam: 07/03/2023 Medical Rec #:  [id number]          Height:       69.0 in Accession #:    [id number]         Weight:       201.1 lb Date of Birth:  10-28-1961          BSA:          2.071 m Patient Age:    61 years           BP:           167/63 mmHg Patient Gender: M                  HR:           59 bpm. Exam Location:  [facility] Procedure: 2D Echo, Cardiac Doppler  and Color Doppler Indications:     Bacteremia R78.81  History:         Patient has no prior history of Echocardiogram examinations.                  CHF, Signs/Symptoms:Murmur; Risk Factors:Hypertension and                  Diabetes.  Sonographer:     Cristela Blue Referring Phys:  [id number] SONA PATEL Diagnosing Phys: Yvonne Kendall MD IMPRESSIONS  1. Left ventricular ejection fraction, by estimation, is 60 to 65%. The left ventricle has normal function. Left ventricular endocardial border not optimally defined to evaluate regional wall motion. There is severe left ventricular hypertrophy. Left ventricular diastolic parameters are consistent with Grade II diastolic dysfunction (pseudonormalization).  2. Right ventricular systolic function is normal. The right ventricular size is normal. Tricuspid regurgitation signal is inadequate for assessing PA pressure.  3. The mitral valve is normal in structure. Trivial mitral valve regurgitation. No evidence of mitral stenosis.  4. The aortic valve is tricuspid. Aortic valve regurgitation is not visualized. No aortic stenosis is present.  5. The inferior vena cava is normal in size with greater than 50% respiratory variability, suggesting right atrial pressure of 3 mmHg. FINDINGS  Left Ventricle: Left ventricular ejection fraction, by estimation, is 60 to 65%. The left ventricle has normal function. Left ventricular endocardial border not optimally defined to evaluate regional wall motion. The left ventricular internal cavity size was normal in size. There is severe left ventricular hypertrophy. Left ventricular diastolic parameters are consistent with Grade II diastolic dysfunction (pseudonormalization). Right Ventricle: The right ventricular size is normal. No increase in right ventricular wall thickness. Right ventricular systolic function is normal. Tricuspid regurgitation signal is inadequate for assessing PA pressure. Left Atrium: Left atrial size was  normal in size. Right  Atrium: Right atrial size was not well visualized. Pericardium: There is no evidence of pericardial effusion. Mitral Valve: The mitral valve is normal in structure. Trivial mitral valve regurgitation. No evidence of mitral valve stenosis. MV peak gradient, 4.4 mmHg. The mean mitral valve gradient is 2.0 mmHg. Tricuspid Valve: The tricuspid valve is not well visualized. Tricuspid valve regurgitation is trivial. Aortic Valve: The aortic valve is tricuspid. There is mild aortic valve annular calcification. Aortic valve regurgitation is not visualized. No aortic stenosis is present. Aortic valve mean gradient measures 5.7 mmHg. Aortic valve peak gradient measures 10.2 mmHg. Aortic valve area, by VTI measures 2.12 cm. Pulmonic Valve: The pulmonic valve was not well visualized. Pulmonic valve regurgitation is not visualized. No evidence of pulmonic stenosis. Aorta: The aortic root is normal in size and structure. Pulmonary Artery: The pulmonary artery is not well seen. Venous: The inferior vena cava is normal in size with greater than 50% respiratory variability, suggesting right atrial pressure of 3 mmHg. IAS/Shunts: The interatrial septum was not well visualized.  LEFT VENTRICLE PLAX 2D LVIDd:         4.40 cm   Diastology LVIDs:         2.80 cm   LV e' medial:    6.20 cm/s LV PW:         1.70 cm   LV E/e' medial:  14.5 LV IVS:        1.50 cm   LV e' lateral:   8.38 cm/s LVOT diam:     2.20 cm   LV E/e' lateral: 10.8 LV SV:         77 LV SV Index:   37 LVOT Area:     3.80 cm  RIGHT VENTRICLE RV Basal diam:  3.50 cm RV Mid diam:    3.10 cm LEFT ATRIUM             Index        RIGHT ATRIUM          Index LA diam:        3.20 cm 1.55 cm/m   RA Area:     7.20 cm LA Vol (A2C):   25.5 ml 12.31 ml/m  RA Volume:   11.50 ml 5.55 ml/m LA Vol (A4C):   21.0 ml 10.14 ml/m LA Biplane Vol: 24.4 ml 11.78 ml/m  AORTIC VALVE AV Area (Vmax):    1.94 cm AV Area (Vmean):   1.85 cm AV Area (VTI):     2.12 cm AV Vmax:            159.33 cm/s AV Vmean:          113.000 cm/s AV VTI:            0.362 m AV Peak Grad:      10.2 mmHg AV Mean Grad:      5.7 mmHg LVOT Vmax:         81.40 cm/s LVOT Vmean:        55.000 cm/s LVOT VTI:          0.202 m LVOT/AV VTI ratio: 0.56  AORTA Ao Root diam: 3.30 cm MITRAL VALVE MV Area (PHT): 2.50 cm    SHUNTS MV Area VTI:   2.03 cm    Systemic VTI:  0.20 m MV Peak grad:  4.4 mmHg    Systemic Diam: 2.20 cm MV Mean grad:  2.0 mmHg MV Vmax:       1.05  m/s MV Vmean:      67.3 cm/s MV Decel Time: 303 msec MV E velocity: 90.10 cm/s MV A velocity: 90.70 cm/s MV E/A ratio:  0.99 Christopher End MD Electronically signed by Yvonne Kendall MD Signature Date/Time: 07/03/2023/1:10:59 PM    Final      Assessment/Plan:  Enterococcus fecalis bacteremia The bacteria is present in the urine as well As he is on dialysys need to r/o endocarditis Will get 2 d echo Will get bladder scan to look for residue US renal to look for any hydronephrosis, renal stone   Dementia   ESRD on dialysis   H/o VCA Chronic CHF   Anemia     DM - management as per primary team

## 2023-07-03 NOTE — Progress Notes (Signed)
*  PRELIMINARY RESULTS* Echocardiogram 2D Echocardiogram has been performed.  Cristela Blue 07/03/2023, 10:58 AM

## 2023-07-03 NOTE — Plan of Care (Deleted)
  Problem: Education: Goal: Knowledge of General Education information will improve Description: Including pain rating scale, medication(s)/side effects and non-pharmacologic comfort measures 07/03/2023 0022 by Randa Evens, RN Outcome: Progressing 07/02/2023 2224 by Randa Evens, RN Outcome: Progressing   Problem: Health Behavior/Discharge Planning: Goal: Ability to manage health-related needs will improve 07/03/2023 0022 by Randa Evens, RN Outcome: Progressing 07/02/2023 2224 by Randa Evens, RN Outcome: Progressing   Problem: Clinical Measurements: Goal: Ability to maintain clinical measurements within normal limits will improve 07/03/2023 0022 by Randa Evens, RN Outcome: Progressing 07/02/2023 2224 by Randa Evens, RN Outcome: Progressing Goal: Will remain free from infection 07/03/2023 0022 by Randa Evens, RN Outcome: Progressing 07/02/2023 2224 by Randa Evens, RN Outcome: Progressing Goal: Diagnostic test results will improve 07/03/2023 0022 by Randa Evens, RN Outcome: Progressing 07/02/2023 2224 by Randa Evens, RN Outcome: Progressing Goal: Respiratory complications will improve 07/03/2023 0022 by Randa Evens, RN Outcome: Progressing 07/02/2023 2224 by Randa Evens, RN Outcome: Progressing Goal: Cardiovascular complication will be avoided 07/03/2023 0022 by Randa Evens, RN Outcome: Progressing 07/02/2023 2224 by Randa Evens, RN Outcome: Progressing   Problem: Activity: Goal: Risk for activity intolerance will decrease 07/03/2023 0022 by Randa Evens, RN Outcome: Progressing 07/02/2023 2224 by Randa Evens, RN Outcome: Progressing   Problem: Nutrition: Goal: Adequate nutrition will be maintained 07/03/2023 0022 by Randa Evens, RN Outcome: Progressing 07/02/2023 2224 by Randa Evens, RN Outcome: Progressing   Problem: Coping: Goal: Level of anxiety will decrease 07/03/2023 0022 by Randa Evens, RN Outcome: Progressing 07/02/2023 2224 by Randa Evens, RN Outcome: Progressing   Problem: Elimination: Goal: Will not experience complications related to bowel motility 07/03/2023 0022 by Randa Evens, RN Outcome: Progressing 07/02/2023 2224 by Randa Evens, RN Outcome: Progressing Goal: Will not experience complications related to urinary retention 07/03/2023 0022 by Randa Evens, RN Outcome: Progressing 07/02/2023 2224 by Randa Evens, RN Outcome: Progressing   Problem: Pain Managment: Goal: General experience of comfort will improve 07/03/2023 0022 by Randa Evens, RN Outcome: Progressing 07/02/2023 2224 by Randa Evens, RN Outcome: Progressing   Problem: Safety: Goal: Ability to remain free from injury will improve 07/03/2023 0022 by Randa Evens, RN Outcome: Progressing 07/02/2023 2224 by Randa Evens, RN Outcome: Progressing   Problem: Skin Integrity: Goal: Risk for impaired skin integrity will decrease 07/03/2023 0022 by Randa Evens, RN Outcome: Progressing 07/02/2023 2224 by Randa Evens, RN Outcome: Progressing

## 2023-07-04 DIAGNOSIS — B952 Enterococcus as the cause of diseases classified elsewhere: Secondary | ICD-10-CM | POA: Diagnosis not present

## 2023-07-04 DIAGNOSIS — R6520 Severe sepsis without septic shock: Secondary | ICD-10-CM | POA: Diagnosis not present

## 2023-07-04 DIAGNOSIS — N186 End stage renal disease: Secondary | ICD-10-CM | POA: Diagnosis not present

## 2023-07-04 DIAGNOSIS — A419 Sepsis, unspecified organism: Secondary | ICD-10-CM | POA: Diagnosis not present

## 2023-07-04 DIAGNOSIS — N3001 Acute cystitis with hematuria: Secondary | ICD-10-CM | POA: Diagnosis not present

## 2023-07-04 DIAGNOSIS — R7881 Bacteremia: Secondary | ICD-10-CM | POA: Diagnosis not present

## 2023-07-04 LAB — CULTURE, BLOOD (ROUTINE X 2): Special Requests: ADEQUATE

## 2023-07-04 LAB — CBC
HCT: 28.6 % — ABNORMAL LOW (ref 39.0–52.0)
Hemoglobin: 9.8 g/dL — ABNORMAL LOW (ref 13.0–17.0)
MCH: 31.9 pg (ref 26.0–34.0)
MCHC: 34.3 g/dL (ref 30.0–36.0)
MCV: 93.2 fL (ref 80.0–100.0)
Platelets: 189 10*3/uL (ref 150–400)
RBC: 3.07 MIL/uL — ABNORMAL LOW (ref 4.22–5.81)
RDW: 12.8 % (ref 11.5–15.5)
WBC: 6.4 10*3/uL (ref 4.0–10.5)
nRBC: 0 % (ref 0.0–0.2)

## 2023-07-04 LAB — VANCOMYCIN, RANDOM: Vancomycin Rm: 19 ug/mL

## 2023-07-04 LAB — CREATININE, SERUM
Creatinine, Ser: 3.68 mg/dL — ABNORMAL HIGH (ref 0.61–1.24)
GFR, Estimated: 18 mL/min — ABNORMAL LOW (ref >60)

## 2023-07-04 MED ORDER — PENTAFLUOROPROP-TETRAFLUOROETH EX AERO: 1.0000 | INHALATION_SPRAY | CUTANEOUS | Status: DC | PRN

## 2023-07-04 MED ORDER — HEPARIN SODIUM (PORCINE) 1000 UNIT/ML DIALYSIS: 1000.0000 [IU] | INTRAMUSCULAR | Status: DC | PRN

## 2023-07-04 MED ORDER — VANCOMYCIN HCL IN DEXTROSE 1-5 GM/200ML-% IV SOLN
1000.0000 mg | INTRAVENOUS | Status: DC
  Administered 2023-07-04 – 2023-07-06 (×2): 1000 mg via INTRAVENOUS
  Filled 2023-07-04 (×3): qty 200

## 2023-07-04 MED ORDER — VANCOMYCIN HCL IN DEXTROSE 1-5 GM/200ML-% IV SOLN
1000.0000 mg | Freq: Once | INTRAVENOUS | Status: DC
  Filled 2023-07-04: qty 200

## 2023-07-04 MED ORDER — LIDOCAINE-PRILOCAINE 2.5-2.5 % EX CREA: 1.0000 | TOPICAL_CREAM | CUTANEOUS | Status: DC | PRN

## 2023-07-04 NOTE — Plan of Care (Signed)

## 2023-07-04 NOTE — Discharge Planning (Signed)
Patient's OPHD clinic, [facility], has been updated on patient's status and possible need for IV ABX at discharge.   Dimas Chyle Dialysis Coordinator II  [facility] Cell: [phone] eFax: [phone] [email or web address]

## 2023-07-04 NOTE — Progress Notes (Signed)
Hemodialysis Note  Received patient in bed to unit. Alert and oriented. Informed consent signed and in chart.   Treatment initiated:0850 Treatment completed: 1247  Patient tolerated treatment well. Transported back to the room alert, without acute distress. Report given to patient's RN.  Access used: RUA AVF  Access issues: None   Total UF removed: 2000 ml Medications given: None  Post HD VS: stable  Post HD weight: 96.3 kg   Lupita Raider [facility]

## 2023-07-04 NOTE — Progress Notes (Signed)
PT Cancellation Note  Patient Details Name: Randall [name] MRN: [id number] DOB: [birth year]   Cancelled Treatment:    Reason Eval/Treat Not Completed: Fatigue/lethargy limiting ability to participate (Tiwce attempted evaluation. Pt in HD in AM. Pt fatigued, hypoactive, somnolent upon each visit. Used sister Randall [name] as a point of reference for current state. Will attempt again next day.)  5:07 PM, 07/04/23 Rosamaria Lints, PT, DPT Physical Therapist - [facility]  [phone] (ASCOM)    [place] C 07/04/2023, 5:07 PM

## 2023-07-04 NOTE — Progress Notes (Addendum)
OT Cancellation Note  Patient Details Name: Randall [name] MRN: [id number] DOB: Jan 12, 1961   Cancelled Treatment:    Reason Eval/Treat Not Completed: Patient at procedure or test/ unavailable (Pt. out of room for a procedure when attempted OT treatment this morning. Will reattempt at a later time, or date.)  Randall Messier, MS, OTR/L 07/04/2023, 8:55 AM

## 2023-07-04 NOTE — Progress Notes (Signed)
Date of Admission:  06/30/2023     ID: Randall [name] is a 62 y.o. male  Principal Problem:   Severe sepsis (HCC) Active Problems:   UTI (urinary tract infection)   ESRD on dialysis (HCC)   Acute encephalopathy   Encephalopathy   Bacteremia due to Enterococcus    Subjective: No specific complaints  Medications:   aspirin EC  81 mg Oral Daily   carvedilol  25 mg Oral BID WC   heparin  5,000 Units Subcutaneous Q12H   pantoprazole  20 mg Oral Daily   rosuvastatin  10 mg Oral QPM   sertraline  100 mg Oral Daily   tamsulosin  0.4 mg Oral QHS   vancomycin variable dose per unstable renal function (pharmacist dosing)   Does not apply See admin instructions    Objective: Vital signs in last 24 hours: Patient Vitals for the past 24 hrs:  BP Temp Temp src Pulse Resp SpO2 Weight  07/04/23 1230 (!) 122/59 98 F (36.7 C) Oral 69 14 99 % --  07/04/23 1200 (!) 124/58 -- -- 77 20 100 % --  07/04/23 1130 (!) 119/57 -- -- 71 20 96 % --  07/04/23 1100 113/63 -- -- 72 17 100 % --  07/04/23 1030 (!) 125/58 -- -- 66 19 100 % --  07/04/23 1000 126/61 -- -- 65 (!) 25 100 % --  07/04/23 0930 (!) 144/68 -- -- (!) 59 19 99 % --  07/04/23 0900 (!) 170/66 -- -- 60 11 97 % --  07/04/23 0855 (!) 170/66 -- -- 63 18 96 % --  07/04/23 0854 -- -- -- -- 18 -- --  07/04/23 0853 -- -- -- (!) 57 17 100 % --  07/04/23 0852 -- -- -- (!) 57 13 100 % --  07/04/23 0845 (!) 167/70 98.3 F (36.8 C) Oral 67 15 100 % 98.3 kg  07/04/23 0821 (!) 135/56 98 F (36.7 C) -- 66 16 100 % --  07/04/23 0512 (!) 160/59 98 F (36.7 C) Oral 66 18 100 % --  07/03/23 1934 (!) 149/58 98.4 F (36.9 C) -- -- 15 100 % --  07/03/23 1746 (!) 132/50 98.3 F (36.8 C) -- (!) 58 19 100 % --      PHYSICAL EXAM:  General: Awake  no distress, appears stated age.  Lungs: Clear to auscultation bilaterally. No Wheezing or Rhonchi. No rales. Heart: Regular rate and rhythm, no murmur, rub or gallop. Abdomen: Soft,  non-tender,not distended. Bowel sounds normal. No masses Extremities: atraumatic, no cyanosis. No edema. No clubbing Skin: No rashes or lesions. Or bruising Lymph: Cervical, supraclavicular normal. Neurologic: Grossly non-focal  Lab Results    Latest Ref Rng & Units 07/04/2023    7:58 AM 07/02/2023    7:17 AM 06/30/2023   10:08 AM  CBC  WBC 4.0 - 10.5 K/uL 6.4  7.4  8.6   Hemoglobin 13.0 - 17.0 g/dL 9.8  9.4  9.9   Hematocrit 39.0 - 52.0 % 28.6  27.6  31.1   Platelets 150 - 400 K/uL 189  161  156        Latest Ref Rng & Units 07/04/2023    4:55 AM 07/02/2023    7:17 AM 07/01/2023    5:07 AM  CMP  Glucose 70 - 99 mg/dL  82  88   BUN 8 - 23 mg/dL  31  29   Creatinine 8.65 - 1.24 mg/dL 7.84  6.96  2.95  Sodium 135 - 145 mmol/L  136  134   Potassium 3.5 - 5.1 mmol/L  4.2  4.0   Chloride 98 - 111 mmol/L  104  100   CO2 22 - 32 mmol/L  22  22   Calcium 8.9 - 10.3 mg/dL  8.5  8.2       Microbiology: BC- enterococcus Repeat NG Urine enterococcus Studies/Results: US RENAL  Result Date: 07/03/2023 CLINICAL DATA:  Hematuria EXAM: RENAL / URINARY TRACT ULTRASOUND COMPLETE COMPARISON:  None Available. FINDINGS: Right Kidney: Renal measurements: 9.0 x 4.7 x 4.5 cm = volume: 98 mL. Echogenicity within normal limits. No mass or hydronephrosis visualized. Left Kidney: Renal measurements: 8.6 x 5.0 x 4.7 cm = volume: 104 mL. Echogenicity within normal limits. No mass or hydronephrosis visualized. Bladder: Appears normal for degree of bladder distention. Other: None. IMPRESSION: No hydronephrosis. Electronically Signed   By: Jearld Lesch M.D.   On: 07/03/2023 13:26   ECHOCARDIOGRAM COMPLETE  Result Date: 07/03/2023    ECHOCARDIOGRAM REPORT   Patient Name:   Randall [name] Date of Exam: 07/03/2023 Medical Rec #:  [id number]          Height:       69.0 in Accession #:    [id number]         Weight:       201.1 lb Date of Birth:  1961-02-13          BSA:          2.071 m Patient Age:    61 years            BP:           167/63 mmHg Patient Gender: M                  HR:           59 bpm. Exam Location:  [facility] Procedure: 2D Echo, Cardiac Doppler and Color Doppler Indications:     Bacteremia R78.81  History:         Patient has no prior history of Echocardiogram examinations.                  CHF, Signs/Symptoms:Murmur; Risk Factors:Hypertension and                  Diabetes.  Sonographer:     Cristela Blue Referring Phys:  [id number] SONA PATEL Diagnosing Phys: Yvonne Kendall MD IMPRESSIONS  1. Left ventricular ejection fraction, by estimation, is 60 to 65%. The left ventricle has normal function. Left ventricular endocardial border not optimally defined to evaluate regional wall motion. There is severe left ventricular hypertrophy. Left ventricular diastolic parameters are consistent with Grade II diastolic dysfunction (pseudonormalization).  2. Right ventricular systolic function is normal. The right ventricular size is normal. Tricuspid regurgitation signal is inadequate for assessing PA pressure.  3. The mitral valve is normal in structure. Trivial mitral valve regurgitation. No evidence of mitral stenosis.  4. The aortic valve is tricuspid. Aortic valve regurgitation is not visualized. No aortic stenosis is present.  5. The inferior vena cava is normal in size with greater than 50% respiratory variability, suggesting right atrial pressure of 3 mmHg. FINDINGS  Left Ventricle: Left ventricular ejection fraction, by estimation, is 60 to 65%. The left ventricle has normal function. Left ventricular endocardial border not optimally defined to evaluate regional wall motion. The left ventricular internal cavity size was normal in size. There is severe left ventricular hypertrophy. Left  ventricular diastolic parameters are consistent with Grade II diastolic dysfunction (pseudonormalization). Right Ventricle: The right ventricular size is normal. No increase in right ventricular wall thickness. Right ventricular systolic  function is normal. Tricuspid regurgitation signal is inadequate for assessing PA pressure. Left Atrium: Left atrial size was normal in size. Right Atrium: Right atrial size was not well visualized. Pericardium: There is no evidence of pericardial effusion. Mitral Valve: The mitral valve is normal in structure. Trivial mitral valve regurgitation. No evidence of mitral valve stenosis. MV peak gradient, 4.4 mmHg. The mean mitral valve gradient is 2.0 mmHg. Tricuspid Valve: The tricuspid valve is not well visualized. Tricuspid valve regurgitation is trivial. Aortic Valve: The aortic valve is tricuspid. There is mild aortic valve annular calcification. Aortic valve regurgitation is not visualized. No aortic stenosis is present. Aortic valve mean gradient measures 5.7 mmHg. Aortic valve peak gradient measures 10.2 mmHg. Aortic valve area, by VTI measures 2.12 cm. Pulmonic Valve: The pulmonic valve was not well visualized. Pulmonic valve regurgitation is not visualized. No evidence of pulmonic stenosis. Aorta: The aortic root is normal in size and structure. Pulmonary Artery: The pulmonary artery is not well seen. Venous: The inferior vena cava is normal in size with greater than 50% respiratory variability, suggesting right atrial pressure of 3 mmHg. IAS/Shunts: The interatrial septum was not well visualized.  LEFT VENTRICLE PLAX 2D LVIDd:         4.40 cm   Diastology LVIDs:         2.80 cm   LV e' medial:    6.20 cm/s LV PW:         1.70 cm   LV E/e' medial:  14.5 LV IVS:        1.50 cm   LV e' lateral:   8.38 cm/s LVOT diam:     2.20 cm   LV E/e' lateral: 10.8 LV SV:         77 LV SV Index:   37 LVOT Area:     3.80 cm  RIGHT VENTRICLE RV Basal diam:  3.50 cm RV Mid diam:    3.10 cm LEFT ATRIUM             Index        RIGHT ATRIUM          Index LA diam:        3.20 cm 1.55 cm/m   RA Area:     7.20 cm LA Vol (A2C):   25.5 ml 12.31 ml/m  RA Volume:   11.50 ml 5.55 ml/m LA Vol (A4C):   21.0 ml 10.14 ml/m LA  Biplane Vol: 24.4 ml 11.78 ml/m  AORTIC VALVE AV Area (Vmax):    1.94 cm AV Area (Vmean):   1.85 cm AV Area (VTI):     2.12 cm AV Vmax:           159.33 cm/s AV Vmean:          113.000 cm/s AV VTI:            0.362 m AV Peak Grad:      10.2 mmHg AV Mean Grad:      5.7 mmHg LVOT Vmax:         81.40 cm/s LVOT Vmean:        55.000 cm/s LVOT VTI:          0.202 m LVOT/AV VTI ratio: 0.56  AORTA Ao Root diam: 3.30 cm MITRAL VALVE MV Area (PHT): 2.50 cm  SHUNTS MV Area VTI:   2.03 cm    Systemic VTI:  0.20 m MV Peak grad:  4.4 mmHg    Systemic Diam: 2.20 cm MV Mean grad:  2.0 mmHg MV Vmax:       1.05 m/s MV Vmean:      67.3 cm/s MV Decel Time: 303 msec MV E velocity: 90.10 cm/s MV A velocity: 90.70 cm/s MV E/A ratio:  0.99 Christopher End MD Electronically signed by Yvonne Kendall MD Signature Date/Time: 07/03/2023/1:10:59 PM    Final      Assessment/Plan:  Enterococcus fecalis bacteremia With complicated UTI- hematuria and cystitis Eventhough he is on  dialysis he still makes urine Renal US no hydronephrosis Bladder scan residue reported in the flow chart as 58 Apparently there was another reading of 230 Will treat with Iv for 2 weeks- need to follow up with urology as he has BPH Can do vancomycin during dialysis until 07/15/23    Dementia   ESRD on dialysis   H/o CVA  Chronic CHF   Anemia     DM - management as per primary team  Discussed with sister and hospitalist

## 2023-07-04 NOTE — Progress Notes (Signed)
  PROGRESS NOTE    Randall [name]  ZOX:[id number] DOB: [birth year] DOA: 06/30/2023 PCP: Gabriel Earing, MD  120A/120A-AA  LOS: 2 days   Brief hospital course:   Assessment & Plan: Randall [name] is a 62 y.o. male  with medical history significant of ESRD on HD MWF, chronic HFpEF, HTN, advanced dementia, BPH, stroke, brought in by caretaker for evaluation of confusion and possible UTI.    Apparently, he became more confused after he had his routine hemodialysis on 06/29/2023.  His sister noticed blood stain in his underwear when he was changing the underwear.Pt was febrile in the ED with temperature 103.4 F and tachypneic with respiratory rate of 22.  He was admitted to the hospital for severe sepsis secondary to acute UTI.    Severe Sepsis secondary to  acute UTI Enterococcus faecalis bacteremia --Lactic acid was 2.8 on admission.   --BC 1/2 Enterococcus faecalis --Urine culture is growing Enterococcus faecalis.   -- ID consultation --TTE without vegetation --Bladder scan 230 cc --pt did not allow I and out cath Plan: --cont IV vanc --cont bladder scan   ESRD on HD:  --iHD per nephro   Chronic diastolic CHF: Compensated   Acute metabolic encephalopathy on underlying dementia:  currently appears to be at baseline.      BPH --cont flomax   Hypertension --cont coreg    history of stroke  --cont ASA and statin    As baseline- per sister who lives with patient at home patient ambulates using walker. Sister transport  pt to dialysis back-and-forth.   DVT prophylaxis: Heparin SQ Code Status: DNR  Family Communication: sister updated at bedside today Level of care: Med-Surg Dispo:   The patient is from: home Anticipated d/c is to: home Anticipated d/c date is: 2-3 days   Subjective and Interval History:  Pt denied pain.  Sister reported pt had better appetite.   Objective: Vitals:   07/04/23 1230 07/04/23 1254 07/04/23 1333 07/04/23 1533  BP: (!) 122/59   120/64 126/67  Pulse: 69 70 69 68  Resp: 14 16 18 14   Temp: 98 F (36.7 C)  98.9 F (37.2 C) 98.2 F (36.8 C)  TempSrc: Oral     SpO2: 99% 100% 100% 100%  Weight: 96 kg     Height:        Intake/Output Summary (Last 24 hours) at 07/04/2023 1834 Last data filed at 07/04/2023 1230 Gross per 24 hour  Intake --  Output 2000 ml  Net -2000 ml   Filed Weights   07/02/23 1245 07/04/23 0845 07/04/23 1230  Weight: 91.2 kg 98.3 kg 96 kg    Examination:   Constitutional: NAD, alert, oriented HEENT: conjunctivae and lids normal, EOMI CV: No cyanosis.   RESP: normal respiratory effort, on RA Neuro: II - XII grossly intact.   Psych: flat mood and affect.     Data Reviewed: I have personally reviewed labs and imaging studies  Time spent: 35 minutes  Darlin Priestly, MD [facility] If 7PM-7AM, please contact night-coverage 07/04/2023, 6:34 PM

## 2023-07-04 NOTE — Progress Notes (Signed)
Pharmacy Antibiotic Note  Randall [name] is a 62 y.o. male admitted on 06/30/2023 with sepsis secondary to an acute UTI. Patient was brought in by caretaker for evaluation of confusion. PMH is significant for advanced dementia, ESRD on HD MWF, chronic HFpEF, HTN, stroke, & BPH. E. faecalis growing in 1 of 4 bottles, no resistance detected. Pharmacy has been consulted for vancomycin dosing.  Plan: 8/28 0455 vanco random level = 19 Vancomycin 1 gm QHD Pharmacy will continue to monitor and dose adjust appropriately  Height: 5\' 9"  (175.3 cm) Weight: 98.3 kg (216 lb 11.4 oz) IBW/kg (Calculated) : 70.7  Temp (24hrs), Avg:98.3 F (36.8 C), Min:98 F (36.7 C), Max:98.9 F (37.2 C)  Recent Labs  Lab 06/30/23 1008 06/30/23 1027 07/01/23 0507 07/02/23 0717 07/04/23 0455 07/04/23 0758  WBC 8.6  --   --  7.4  --  6.4  CREATININE 2.78*  --  4.05* 4.27* 3.68*  --   LATICACIDVEN 2.8* 1.1  --   --   --   --   VANCORANDOM  --   --   --   --  19  --     Estimated Creatinine Clearance: 24.4 mL/min (A) (by C-G formula based on SCr of 3.68 mg/dL (H)).    Allergies  Allergen Reactions   Penicillins     Reaction in childhood - unknown reaction Patient states he has tolerated amoxicillin.      Antimicrobials this admission: Cefepime 8/24 x 1 Metronidazole 8/24 x 1 Ceftriaxone 8/25 x 1 Vancomycin 8/24 >>   Dose adjustments this admission:  Microbiology results: 8/24 Ucx: E. Faecalis (S vanc, ampicillin) 8/24 Bcx: 1/4 E. Faecalis 8/27 Bcx: NG < 12h  Thank you for allowing pharmacy to be a part of this patient's care.  Littie Deeds, PharmD PGY1 Pharmacy Resident 07/04/2023 2:28 PM

## 2023-07-04 NOTE — Progress Notes (Signed)
[facility]  ROUNDING NOTE   Subjective:   LLIAM [name]  is a 62 year old male with past medical conditions including hypertension, type 2 diabetes, Bell's palsy, CVA, CHF, hyperlipidemia, and end-stage renal disease on hemodialysis.  Patient presents to the emergency department complaining of weakness and confusion.  Patient has been admitted under observation Encephalopathy [G93.40] Acute cystitis with hematuria [N30.01] Altered mental status, unspecified altered mental status type [R41.82] Sepsis, due to unspecified organism, unspecified whether acute organ dysfunction present [facility]) [A41.9]  Patient is known to our practice and receives outpatient dialysis treatments at [facility] on a MWF schedule, supervised by Dr. Cherylann Ratel. Patient is a poor historian and chart review used for this visit.   Patient seen and evaluated during dialysis    HEMODIALYSIS FLOWSHEET:  Blood Flow Rate (mL/min): 400 mL/min Arterial Pressure (mmHg): -190 mmHg Venous Pressure (mmHg): 230 mmHg TMP (mmHg): 10 mmHg Ultrafiltration Rate (mL/min): 781 mL/min Dialysate Flow Rate (mL/min): 300 ml/min  Able to respond to simple questions  Denies pain  Objective:  Vital signs in last 24 hours:  Temp:  [98 F (36.7 C)-98.4 F (36.9 C)] 98.3 F (36.8 C) (08/28 0845) Pulse Rate:  [57-72] 71 (08/28 1130) Resp:  [11-25] 20 (08/28 1130) BP: (113-170)/(50-70) 119/57 (08/28 1130) SpO2:  [96 %-100 %] 96 % (08/28 1130) Weight:  [98.3 kg] 98.3 kg (08/28 0845)  Weight change:  Filed Weights   07/02/23 0856 07/02/23 1245 07/04/23 0845  Weight: 92.7 kg 91.2 kg 98.3 kg    Intake/Output: I/O last 3 completed shifts: In: 240 [P.O.:240] Out: -    Intake/Output this shift:  No intake/output data recorded.  Physical Exam: General: NAD  Head: Normocephalic, atraumatic. Moist oral mucosal membranes  Eyes: Anicteric  Lungs:  Clear to auscultation, normal effort  Heart: Regular rate and  rhythm  Abdomen:  Soft, nontender  Extremities:  No peripheral edema.  Neurologic: Alert with delayed response, moving all four extremities  Skin: No lesions  Access: Rt AVF    Basic Metabolic Panel: Recent Labs  Lab 06/30/23 1008 07/01/23 0507 07/02/23 0717 07/04/23 0455  NA 139 134* 136  --   K 3.6 4.0 4.2  --   CL 108 100 104  --   CO2 22 22 22   --   GLUCOSE 89 88 82  --   BUN 17 29* 31*  --   CREATININE 2.78* 4.05* 4.27* 3.68*  CALCIUM 6.9* 8.2* 8.5*  --   PHOS  --   --  3.2  --     Liver Function Tests: Recent Labs  Lab 06/30/23 1008 07/02/23 0717  AST 18  --   ALT 9  --   ALKPHOS 57  --   BILITOT 0.5  --   PROT 6.0*  --   ALBUMIN 2.9* 2.8*   No results for input(s): "LIPASE", "AMYLASE" in the last 168 hours. No results for input(s): "AMMONIA" in the last 168 hours.  CBC: Recent Labs  Lab 06/30/23 1008 07/02/23 0717 07/04/23 0758  WBC 8.6 7.4 6.4  NEUTROABS 7.7  --   --   HGB 9.9* 9.4* 9.8*  HCT 31.1* 27.6* 28.6*  MCV 99.4 94.8 93.2  PLT 156 161 189    Cardiac Enzymes: No results for input(s): "CKTOTAL", "CKMB", "CKMBINDEX", "TROPONINI" in the last 168 hours.  BNP: Invalid input(s): "POCBNP"  CBG: Recent Labs  Lab 06/30/23 1006 06/30/23 1629 07/02/23 2228 07/03/23 1538  GLUCAP 88 144* 145* 120*    Microbiology:  Results for orders placed or performed during the hospital encounter of 06/30/23  Resp panel by RT-PCR (RSV, Flu A&B, Covid) Anterior Nasal Swab     Status: None   Collection Time: 06/30/23 10:08 AM   Specimen: Anterior Nasal Swab  Result Value Ref Range Status   SARS Coronavirus 2 by RT PCR NEGATIVE NEGATIVE Final    Comment: (NOTE) SARS-CoV-2 target nucleic acids are NOT DETECTED.  The SARS-CoV-2 RNA is generally detectable in upper respiratory specimens during the acute phase of infection. The lowest concentration of SARS-CoV-2 viral copies this assay can detect is 138 copies/mL. A negative result does not preclude  SARS-Cov-2 infection and should not be used as the sole basis for treatment or other patient management decisions. A negative result may occur with  improper specimen collection/handling, submission of specimen other than nasopharyngeal swab, presence of viral mutation(s) within the areas targeted by this assay, and inadequate number of viral copies(<138 copies/mL). A negative result must be combined with clinical observations, patient history, and epidemiological information. The expected result is Negative.  Fact Sheet for Patients:  BloggerCourse.com  Fact Sheet for Healthcare Providers:  SeriousBroker.it  This test is no t yet approved or cleared by the [place] FDA and  has been authorized for detection and/or diagnosis of SARS-CoV-2 by FDA under an Emergency Use Authorization (EUA). This EUA will remain  in effect (meaning this test can be used) for the duration of the COVID-19 declaration under Section 564(b)(1) of the Act, 21 U.S.C.section 360bbb-3(b)(1), unless the authorization is terminated  or revoked sooner.       Influenza A by PCR NEGATIVE NEGATIVE Final   Influenza B by PCR NEGATIVE NEGATIVE Final    Comment: (NOTE) The Xpert Xpress SARS-CoV-2/FLU/RSV plus assay is intended as an aid in the diagnosis of influenza from Nasopharyngeal swab specimens and should not be used as a sole basis for treatment. Nasal washings and aspirates are unacceptable for Xpert Xpress SARS-CoV-2/FLU/RSV testing.  Fact Sheet for Patients: BloggerCourse.com  Fact Sheet for Healthcare Providers: SeriousBroker.it  This test is not yet approved or cleared by the [place] FDA and has been authorized for detection and/or diagnosis of SARS-CoV-2 by FDA under an Emergency Use Authorization (EUA). This EUA will remain in effect (meaning this test can be used) for the duration of  the COVID-19 declaration under Section 564(b)(1) of the Act, 21 U.S.C. section 360bbb-3(b)(1), unless the authorization is terminated or revoked.     Resp Syncytial Virus by PCR NEGATIVE NEGATIVE Final    Comment: (NOTE) Fact Sheet for Patients: BloggerCourse.com  Fact Sheet for Healthcare Providers: SeriousBroker.it  This test is not yet approved or cleared by the [place] FDA and has been authorized for detection and/or diagnosis of SARS-CoV-2 by FDA under an Emergency Use Authorization (EUA). This EUA will remain in effect (meaning this test can be used) for the duration of the COVID-19 declaration under Section 564(b)(1) of the Act, 21 U.S.C. section 360bbb-3(b)(1), unless the authorization is terminated or revoked.  Performed at [facility], [address] [place], [place] [address]   Blood Culture (routine x 2)     Status: Abnormal   Collection Time: 06/30/23 10:27 AM   Specimen: BLOOD  Result Value Ref Range Status   Specimen Description   Final    BLOOD BLOOD LEFT ARM Performed at [facility], [address], [place], [place] [address]    Special Requests   Final    BOTTLES DRAWN AEROBIC  AND ANAEROBIC Blood Culture adequate volume Performed at [facility], [address] [place], [place] [address]    Culture  Setup Time   Final    GRAM POSITIVE COCCI AEROBIC BOTTLE ONLY Organism ID to follow CRITICAL RESULT CALLED TO, READ BACK BY AND VERIFIED WITH: JASON ROBBINS @ 0604 07/01/23 LFD GRAM STAIN REVIEWED-AGREE WITH RESULT Performed at [facility], [address] [place], [place] [address]    Culture ENTEROCOCCUS FAECALIS (A)  Final   Report Status 07/04/2023 FINAL  Final   Organism ID, Bacteria ENTEROCOCCUS FAECALIS  Final      Susceptibility   Enterococcus faecalis - MIC*    AMPICILLIN <=2 SENSITIVE Sensitive     VANCOMYCIN 1 SENSITIVE Sensitive     GENTAMICIN  SYNERGY SENSITIVE Sensitive     * ENTEROCOCCUS FAECALIS  Blood Culture (routine x 2)     Status: None (Preliminary result)   Collection Time: 06/30/23 10:27 AM   Specimen: BLOOD  Result Value Ref Range Status   Specimen Description BLOOD LEFT ANTECUBITAL  Final   Special Requests   Final    BOTTLES DRAWN AEROBIC AND ANAEROBIC Blood Culture adequate volume   Culture   Final    NO GROWTH 4 DAYS Performed at [facility], [address] [place], [place] [address]    Report Status PENDING  Incomplete  Urine Culture     Status: Abnormal   Collection Time: 06/30/23 10:27 AM   Specimen: Urine, Random  Result Value Ref Range Status   Specimen Description   Final    URINE, RANDOM Performed at [facility], [address] [place], [place] [address]    Special Requests   Final    NONE Reflexed from [id number] Performed at [facility], [address] [place], [place] [address]    Culture >=100,000 COLONIES/mL ENTEROCOCCUS FAECALIS (A)  Final   Report Status 07/02/2023 FINAL  Final   Organism ID, Bacteria ENTEROCOCCUS FAECALIS (A)  Final      Susceptibility   Enterococcus faecalis - MIC*    AMPICILLIN <=2 SENSITIVE Sensitive     NITROFURANTOIN <=16 SENSITIVE Sensitive     VANCOMYCIN <=0.5 SENSITIVE Sensitive     * >=100,000 COLONIES/mL ENTEROCOCCUS FAECALIS  Blood Culture ID Panel (Reflexed)     Status: Abnormal   Collection Time: 06/30/23 10:27 AM  Result Value Ref Range Status   Enterococcus faecalis DETECTED (A) NOT DETECTED Final    Comment: CRITICAL RESULT CALLED TO, READ BACK BY AND VERIFIED WITH: JASON ROBBINS @ 0604 07/01/23 LFD    Enterococcus Faecium NOT DETECTED NOT DETECTED Final   Listeria monocytogenes NOT DETECTED NOT DETECTED Final   Staphylococcus species NOT DETECTED NOT DETECTED Final   Staphylococcus aureus (BCID) NOT DETECTED NOT DETECTED Final   Staphylococcus epidermidis NOT DETECTED NOT DETECTED Final   Staphylococcus lugdunensis  NOT DETECTED NOT DETECTED Final   Streptococcus species NOT DETECTED NOT DETECTED Final   Streptococcus agalactiae NOT DETECTED NOT DETECTED Final   Streptococcus pneumoniae NOT DETECTED NOT DETECTED Final   Streptococcus pyogenes NOT DETECTED NOT DETECTED Final   A.calcoaceticus-baumannii NOT DETECTED NOT DETECTED Final   Bacteroides fragilis NOT DETECTED NOT DETECTED Final   Enterobacterales NOT DETECTED NOT DETECTED Final   Enterobacter cloacae complex NOT DETECTED NOT DETECTED Final   Escherichia coli NOT DETECTED NOT DETECTED Final   Klebsiella aerogenes NOT DETECTED NOT DETECTED Final   Klebsiella oxytoca NOT DETECTED NOT DETECTED Final   Klebsiella pneumoniae  NOT DETECTED NOT DETECTED Final   Proteus species NOT DETECTED NOT DETECTED Final   Salmonella species NOT DETECTED NOT DETECTED Final   Serratia marcescens NOT DETECTED NOT DETECTED Final   Haemophilus influenzae NOT DETECTED NOT DETECTED Final   Neisseria meningitidis NOT DETECTED NOT DETECTED Final   Pseudomonas aeruginosa NOT DETECTED NOT DETECTED Final   Stenotrophomonas maltophilia NOT DETECTED NOT DETECTED Final   Candida albicans NOT DETECTED NOT DETECTED Final   Candida auris NOT DETECTED NOT DETECTED Final   Candida glabrata NOT DETECTED NOT DETECTED Final   Candida krusei NOT DETECTED NOT DETECTED Final   Candida parapsilosis NOT DETECTED NOT DETECTED Final   Candida tropicalis NOT DETECTED NOT DETECTED Final   Cryptococcus neoformans/gattii NOT DETECTED NOT DETECTED Final   Vancomycin resistance NOT DETECTED NOT DETECTED Final    Comment: Performed at [facility], [address] [place], [place] [address]  Culture, blood (Routine X 2) w Reflex to ID Panel     Status: None (Preliminary result)   Collection Time: 07/03/23  8:31 AM   Specimen: BLOOD  Result Value Ref Range Status   Specimen Description BLOOD BLOOD LEFT HAND  Final   Special Requests   Final    BOTTLES DRAWN AEROBIC AND ANAEROBIC  Blood Culture adequate volume   Culture   Final    NO GROWTH < 24 HOURS Performed at [facility], [address] [place], [place] [address]    Report Status PENDING  Incomplete  Culture, blood (Routine X 2) w Reflex to ID Panel     Status: None (Preliminary result)   Collection Time: 07/03/23  8:31 AM   Specimen: BLOOD  Result Value Ref Range Status   Specimen Description BLOOD LEFT AC  Final   Special Requests   Final    BOTTLES DRAWN AEROBIC AND ANAEROBIC Blood Culture adequate volume   Culture   Final    NO GROWTH < 24 HOURS Performed at [facility], [address] [place], [place] [address]    Report Status PENDING  Incomplete    Coagulation Studies: No results for input(s): "LABPROT", "INR" in the last 72 hours.   Urinalysis: No results for input(s): "COLORURINE", "LABSPEC", "PHURINE", "GLUCOSEU", "HGBUR", "BILIRUBINUR", "KETONESUR", "PROTEINUR", "UROBILINOGEN", "NITRITE", "LEUKOCYTESUR" in the last 72 hours.  Invalid input(s): "APPERANCEUR"     Imaging: US RENAL  Result Date: 07/03/2023 CLINICAL DATA:  Hematuria EXAM: RENAL / URINARY TRACT ULTRASOUND COMPLETE COMPARISON:  None Available. FINDINGS: Right Kidney: Renal measurements: 9.0 x 4.7 x 4.5 cm = volume: 98 mL. Echogenicity within normal limits. No mass or hydronephrosis visualized. Left Kidney: Renal measurements: 8.6 x 5.0 x 4.7 cm = volume: 104 mL. Echogenicity within normal limits. No mass or hydronephrosis visualized. Bladder: Appears normal for degree of bladder distention. Other: None. IMPRESSION: No hydronephrosis. Electronically Signed   By: Jearld Lesch M.D.   On: 07/03/2023 13:26   ECHOCARDIOGRAM COMPLETE  Result Date: 07/03/2023    ECHOCARDIOGRAM REPORT   Patient Name:   Randall [name] Date of Exam: 07/03/2023 Medical Rec #:  [id number]          Height:       69.0 in Accession #:    [id number]         Weight:       201.1 lb Date of Birth:  09/10/1961          BSA:          2.071  m Patient Age:  61 years           BP:           167/63 mmHg Patient Gender: M                  HR:           59 bpm. Exam Location:  [facility] Procedure: 2D Echo, Cardiac Doppler and Color Doppler Indications:     Bacteremia R78.81  History:         Patient has no prior history of Echocardiogram examinations.                  CHF, Signs/Symptoms:Murmur; Risk Factors:Hypertension and                  Diabetes.  Sonographer:     Cristela Blue Referring Phys:  [id number] SONA PATEL Diagnosing Phys: Yvonne Kendall MD IMPRESSIONS  1. Left ventricular ejection fraction, by estimation, is 60 to 65%. The left ventricle has normal function. Left ventricular endocardial border not optimally defined to evaluate regional wall motion. There is severe left ventricular hypertrophy. Left ventricular diastolic parameters are consistent with Grade II diastolic dysfunction (pseudonormalization).  2. Right ventricular systolic function is normal. The right ventricular size is normal. Tricuspid regurgitation signal is inadequate for assessing PA pressure.  3. The mitral valve is normal in structure. Trivial mitral valve regurgitation. No evidence of mitral stenosis.  4. The aortic valve is tricuspid. Aortic valve regurgitation is not visualized. No aortic stenosis is present.  5. The inferior vena cava is normal in size with greater than 50% respiratory variability, suggesting right atrial pressure of 3 mmHg. FINDINGS  Left Ventricle: Left ventricular ejection fraction, by estimation, is 60 to 65%. The left ventricle has normal function. Left ventricular endocardial border not optimally defined to evaluate regional wall motion. The left ventricular internal cavity size was normal in size. There is severe left ventricular hypertrophy. Left ventricular diastolic parameters are consistent with Grade II diastolic dysfunction (pseudonormalization). Right Ventricle: The right ventricular size is normal. No increase in right ventricular wall thickness.  Right ventricular systolic function is normal. Tricuspid regurgitation signal is inadequate for assessing PA pressure. Left Atrium: Left atrial size was normal in size. Right Atrium: Right atrial size was not well visualized. Pericardium: There is no evidence of pericardial effusion. Mitral Valve: The mitral valve is normal in structure. Trivial mitral valve regurgitation. No evidence of mitral valve stenosis. MV peak gradient, 4.4 mmHg. The mean mitral valve gradient is 2.0 mmHg. Tricuspid Valve: The tricuspid valve is not well visualized. Tricuspid valve regurgitation is trivial. Aortic Valve: The aortic valve is tricuspid. There is mild aortic valve annular calcification. Aortic valve regurgitation is not visualized. No aortic stenosis is present. Aortic valve mean gradient measures 5.7 mmHg. Aortic valve peak gradient measures 10.2 mmHg. Aortic valve area, by VTI measures 2.12 cm. Pulmonic Valve: The pulmonic valve was not well visualized. Pulmonic valve regurgitation is not visualized. No evidence of pulmonic stenosis. Aorta: The aortic root is normal in size and structure. Pulmonary Artery: The pulmonary artery is not well seen. Venous: The inferior vena cava is normal in size with greater than 50% respiratory variability, suggesting right atrial pressure of 3 mmHg. IAS/Shunts: The interatrial septum was not well visualized.  LEFT VENTRICLE PLAX 2D LVIDd:         4.40 cm   Diastology LVIDs:         2.80 cm   LV e' medial:  6.20 cm/s LV PW:         1.70 cm   LV E/e' medial:  14.5 LV IVS:        1.50 cm   LV e' lateral:   8.38 cm/s LVOT diam:     2.20 cm   LV E/e' lateral: 10.8 LV SV:         77 LV SV Index:   37 LVOT Area:     3.80 cm  RIGHT VENTRICLE RV Basal diam:  3.50 cm RV Mid diam:    3.10 cm LEFT ATRIUM             Index        RIGHT ATRIUM          Index LA diam:        3.20 cm 1.55 cm/m   RA Area:     7.20 cm LA Vol (A2C):   25.5 ml 12.31 ml/m  RA Volume:   11.50 ml 5.55 ml/m LA Vol (A4C):    21.0 ml 10.14 ml/m LA Biplane Vol: 24.4 ml 11.78 ml/m  AORTIC VALVE AV Area (Vmax):    1.94 cm AV Area (Vmean):   1.85 cm AV Area (VTI):     2.12 cm AV Vmax:           159.33 cm/s AV Vmean:          113.000 cm/s AV VTI:            0.362 m AV Peak Grad:      10.2 mmHg AV Mean Grad:      5.7 mmHg LVOT Vmax:         81.40 cm/s LVOT Vmean:        55.000 cm/s LVOT VTI:          0.202 m LVOT/AV VTI ratio: 0.56  AORTA Ao Root diam: 3.30 cm MITRAL VALVE MV Area (PHT): 2.50 cm    SHUNTS MV Area VTI:   2.03 cm    Systemic VTI:  0.20 m MV Peak grad:  4.4 mmHg    Systemic Diam: 2.20 cm MV Mean grad:  2.0 mmHg MV Vmax:       1.05 m/s MV Vmean:      67.3 cm/s MV Decel Time: 303 msec MV E velocity: 90.10 cm/s MV A velocity: 90.70 cm/s MV E/A ratio:  0.99 Christopher End MD Electronically signed by Yvonne Kendall MD Signature Date/Time: 07/03/2023/1:10:59 PM    Final      Medications:      aspirin EC  81 mg Oral Daily   carvedilol  25 mg Oral BID WC   heparin  5,000 Units Subcutaneous Q12H   pantoprazole  20 mg Oral Daily   rosuvastatin  10 mg Oral QPM   sertraline  100 mg Oral Daily   tamsulosin  0.4 mg Oral QHS   vancomycin variable dose per unstable renal function (pharmacist dosing)   Does not apply See admin instructions   heparin, lidocaine-prilocaine, ondansetron **OR** ondansetron (ZOFRAN) IV, pentafluoroprop-tetrafluoroeth  Assessment/ Plan:  Mr. GARED GAZDA is a 62 y.o.  male past medical conditions including hypertension, type 2 diabetes, Bell's palsy, CVA, CHF, hyperlipidemia, and end-stage renal disease on hemodialysis.  Patient presents to the emergency department complaining of weakness and confusion.  Patient has been admitted under observation Encephalopathy [G93.40] Acute cystitis with hematuria [N30.01] Altered mental status, unspecified altered mental status type [R41.82] Sepsis, due to unspecified organism, unspecified whether acute organ dysfunction  present (HCC)  [A41.9]   1. End stage renal disease on hemodialysis. Receiving dialysis today, UF 2L as tolerated. Next treatment scheduled for Friday.   2. Sepsis secondary due to UTI. Urine and blood culture growing Enterococcus faecalis. IV vancomycin. ID consulted   3. Anemia of chronic kidney disease Lab Results  Component Value Date   HGB 9.8 (L) 07/04/2023    Hgb within desired range for renal patient, 9-11. Patient receives Mircera at outpatient clinic. Will continue to monitor.   4. Hypertension with chronic kidney disease. Home regimen includes carvedilol. Also on tamsulosin. Receiving these medications.   Blood pressure 108/62 during dialysis  5. Secondary Hyperparathyroidism: with outpatient labs: none available  Lab Results  Component Value Date   CALCIUM 8.5 (L) 07/02/2023   CAION 1.19 06/15/2022   PHOS 3.2 07/02/2023    Patient receives cholecalciferol and sevelamer outpatient.Bone minerals acceptable.    LOS: 2   8/28/202411:39 AM

## 2023-07-05 DIAGNOSIS — R6520 Severe sepsis without septic shock: Secondary | ICD-10-CM | POA: Diagnosis not present

## 2023-07-05 DIAGNOSIS — A419 Sepsis, unspecified organism: Secondary | ICD-10-CM | POA: Diagnosis not present

## 2023-07-05 LAB — BASIC METABOLIC PANEL
Anion gap: 9 (ref 5–15)
BUN: 22 mg/dL (ref 8–23)
CO2: 28 mmol/L (ref 22–32)
Calcium: 8.7 mg/dL — ABNORMAL LOW (ref 8.9–10.3)
Chloride: 97 mmol/L — ABNORMAL LOW (ref 98–111)
Creatinine, Ser: 3.4 mg/dL — ABNORMAL HIGH (ref 0.61–1.24)
GFR, Estimated: 20 mL/min — ABNORMAL LOW (ref >60)
Glucose, Bld: 95 mg/dL (ref 70–99)
Potassium: 3.7 mmol/L (ref 3.5–5.1)
Sodium: 134 mmol/L — ABNORMAL LOW (ref 135–145)

## 2023-07-05 LAB — CULTURE, BLOOD (ROUTINE X 2)
Culture: NO GROWTH
Special Requests: ADEQUATE

## 2023-07-05 LAB — CBC
HCT: 29.4 % — ABNORMAL LOW (ref 39.0–52.0)
Hemoglobin: 10.1 g/dL — ABNORMAL LOW (ref 13.0–17.0)
MCH: 32 pg (ref 26.0–34.0)
MCHC: 34.4 g/dL (ref 30.0–36.0)
MCV: 93 fL (ref 80.0–100.0)
Platelets: 198 10*3/uL (ref 150–400)
RBC: 3.16 MIL/uL — ABNORMAL LOW (ref 4.22–5.81)
RDW: 12.6 % (ref 11.5–15.5)
WBC: 6.3 10*3/uL (ref 4.0–10.5)
nRBC: 0 % (ref 0.0–0.2)

## 2023-07-05 LAB — MAGNESIUM: Magnesium: 1.8 mg/dL (ref 1.7–2.4)

## 2023-07-05 MED ORDER — PENTAFLUOROPROP-TETRAFLUOROETH EX AERO: 1.0000 | INHALATION_SPRAY | CUTANEOUS | Status: DC | PRN

## 2023-07-05 MED ORDER — HEPARIN SODIUM (PORCINE) 1000 UNIT/ML DIALYSIS: 1000.0000 [IU] | INTRAMUSCULAR | Status: DC | PRN

## 2023-07-05 MED ORDER — LIDOCAINE-PRILOCAINE 2.5-2.5 % EX CREA: 1.0000 | TOPICAL_CREAM | CUTANEOUS | Status: DC | PRN

## 2023-07-05 NOTE — Evaluation (Signed)
Physical Therapy Evaluation Patient Details Name: Randall [name] MRN: [id number] DOB: 02/14/1961 Today's Date: 07/05/2023  History of Present Illness  Randall [name] is a 61yoM who comes to [facility] on 06/30/23 c AMS, emesis, hypoxia. Pt febrile in ED 103.4. PMH: dementia, CHF, ESRD on HD MWF, DM, CVA. Pt being treated for positive blood cultures, followed by ID.  Clinical Impression  Pt requires minA to get to EOB, minA to stand, significant imbalance on feet while standing, not strong enough to feel safe/confident in walking. A RW is later provided but only somewhat improves these areas of acute deficit. At baseline pt does not use a RW in the home and requires no physical assist. Pt agreeable to sit up in recliner in anticipation of breakfast tray. RN in room at authors exit.       If plan is discharge home, recommend the following: Two people to help with walking and/or transfers;A little help with bathing/dressing/bathroom;Help with stairs or ramp for entrance;Assist for transportation   Can travel by private vehicle   No    Equipment Recommendations None recommended by PT  Recommendations for Other Services       Functional Status Assessment       Precautions / Restrictions Precautions Precautions: Fall Restrictions Weight Bearing Restrictions: No      Mobility  Bed Mobility Overal bed mobility: Needs Assistance Bed Mobility: Supine to Sit     Supine to sit: Min assist          Transfers Overall transfer level: Needs assistance Equipment used: Rolling walker (2 wheels), None Transfers: Sit to/from Stand Sit to Stand: Min assist           General transfer comment: poor problem solving    Ambulation/Gait Ambulation/Gait assistance: Contact guard assist Gait Distance (Feet): 6 Feet Assistive device: Rolling walker (2 wheels)         General Gait Details: feels week, unsafe to remain up much longer  Stairs            Wheelchair Mobility      Tilt Bed    Modified Rankin (Stroke Patients Only)       Balance                                             Pertinent Vitals/Pain Pain Assessment Pain Assessment: No/denies pain    Home Living Family/patient expects to be discharged to:: Private residence Living Arrangements: Spouse/significant other Available Help at Discharge: Family;Available 24 hours/day   Home Access: Stairs to enter Entrance Stairs-Rails: None Entrance Stairs-Number of Steps: 4   Home Layout: One level Home Equipment: Tub bench;Hand held shower head;BSC/3in1;Rolling Walker (2 wheels);Wheelchair - manual Additional Comments: All history provided by pt's sister at bedside, as pt has dementia and was providing incorrect information upon OT asking him. Sister is retired and IT trainer.    Prior Function Prior Level of Function : Needs assist             Mobility Comments: when going out to HD pt uses RW. Recent difficulty with navigating stairs to enter. no device in home, just walks slowly. ADLs Comments: Sister states she assists with dressing, grooming (sister helps clean pt's dentures), bathing (sometimes sponge bathing and sometimes showering with tub transfer bench into shower; whichever is feasible that day). Pt is typically able to eat and take himself to  the bathroom without assistance. Sister assists with all IADLs, including driving, medication management, household chores, and food preparation. Pt enjoys watching chef shows and Gunsmoke. Pt goes to HD MWF. Has lived with the sister for "about a year or two" per sister.     Extremity/Trunk Assessment                Communication      Cognition Arousal: Alert Behavior During Therapy: WFL for tasks assessed/performed, Flat affect Overall Cognitive Status: Within Functional Limits for tasks assessed                                          General Comments      Exercises      Assessment/Plan    PT Assessment    PT Problem List         PT Treatment Interventions      PT Goals (Current goals can be found in the Care Plan section)  Acute Rehab PT Goals Patient Stated Goal: regain strength to allow her to provide care in the home PT Goal Formulation: With family Time For Goal Achievement: 07/19/23 Potential to Achieve Goals: Good    Frequency Min 1X/week     Co-evaluation               AM-PAC PT "6 Clicks" Mobility  Outcome Measure Help needed turning from your back to your side while in a flat bed without using bedrails?: A Lot Help needed moving from lying on your back to sitting on the side of a flat bed without using bedrails?: A Lot Help needed moving to and from a bed to a chair (including a wheelchair)?: A Lot Help needed standing up from a chair using your arms (e.g., wheelchair or bedside chair)?: A Lot Help needed to walk in hospital room?: A Lot Help needed climbing 3-5 steps with a railing? : A Lot 6 Click Score: 12    End of Session   Activity Tolerance: Patient tolerated treatment well Patient left: in chair;with nursing/sitter in room;with call bell/phone within reach;with chair alarm set Nurse Communication: Mobility status PT Visit Diagnosis: Unsteadiness on feet (R26.81);Difficulty in walking, not elsewhere classified (R26.2);Muscle weakness (generalized) (M62.81)    Time: 4098-1191 PT Time Calculation (min) (ACUTE ONLY): 15 min   Charges:   PT Evaluation $PT Eval Moderate Complexity: 1 Mod   PT General Charges $$ ACUTE PT VISIT: 1 Visit        11:36 AM, 07/05/23 Rosamaria Lints, PT, DPT Physical Therapist - [facility]  [phone] (ASCOM)   Qianna Clagett C 07/05/2023, 11:33 AM

## 2023-07-05 NOTE — TOC Progression Note (Signed)
Transition of Care [facility]) - Progression Note    Patient Details  Name: Randall [name] MRN: [id number] Date of Birth: 03/26/1961  Transition of Care [facility]) CM/SW Contact  Allena Katz, LCSW Phone Number: 07/05/2023, 3:48 PM  Clinical Narrative:   Recs now changed to SNF. Pt sister reports she would like him to go to [facility] as her first choice. CSW to start workup.          Expected Discharge Plan and Services                                               Social Determinants of Health (SDOH) Interventions SDOH Screenings   Food Insecurity: No Food Insecurity (06/30/2023)  Housing: Low Risk  (06/30/2023)  Transportation Needs: No Transportation Needs (06/30/2023)  Utilities: Not At Risk (06/30/2023)  Financial Resource Strain: High Risk (01/26/2022)   Received from [facility], [facility]  Physical Activity: Insufficiently Active (08/18/2021)   Received from [facility], [facility]  Social Connections: Moderately Integrated (08/18/2021)   Received from [facility], [facility]  Stress: Stress Concern Present (01/26/2022)   Received from [facility], [facility]  Tobacco Use: Medium Risk (06/30/2023)  Health Literacy: Low Risk  (04/19/2023)   Received from [facility]    Readmission Risk Interventions     No data to display

## 2023-07-05 NOTE — Progress Notes (Signed)
[facility]  ROUNDING NOTE   Subjective:   Randall [name]  is a 62 year old male with past medical conditions including hypertension, type 2 diabetes, Bell's palsy, CVA, CHF, hyperlipidemia, and end-stage renal disease on hemodialysis.  Patient presents to the emergency department complaining of weakness and confusion.  Patient has been admitted under observation Encephalopathy [G93.40] Acute cystitis with hematuria [N30.01] Altered mental status, unspecified altered mental status type [R41.82] Sepsis, due to unspecified organism, unspecified whether acute organ dysfunction present [facility]) [A41.9]  Patient is known to our practice and receives outpatient dialysis treatments at [facility] on a MWF schedule, supervised by Dr. Cherylann Ratel. Patient is a poor historian and chart review used for this visit.   Patient seen sitting up in bed Alert and able to answer simple questions Denies pain or discomfort   Objective:  Vital signs in last 24 hours:  Temp:  [97.8 F (36.6 C)-98.9 F (37.2 C)] 98 F (36.7 C) (08/29 0821) Pulse Rate:  [61-77] 66 (08/29 0821) Resp:  [14-20] 18 (08/29 0821) BP: (119-139)/(52-67) 139/60 (08/29 0821) SpO2:  [94 %-100 %] 100 % (08/29 0821) Weight:  [96 kg] 96 kg (08/28 1230)  Weight change:  Filed Weights   07/02/23 1245 07/04/23 0845 07/04/23 1230  Weight: 91.2 kg 98.3 kg 96 kg    Intake/Output: I/O last 3 completed shifts: In: -  Out: 2000 [Other:2000]   Intake/Output this shift:  No intake/output data recorded.  Physical Exam: General: NAD  Head: Normocephalic, atraumatic. Moist oral mucosal membranes  Eyes: Anicteric  Lungs:  Clear to auscultation, normal effort  Heart: Regular rate and rhythm  Abdomen:  Soft, nontender  Extremities:  No peripheral edema.  Neurologic: Alert with delayed response, moving all four extremities  Skin: No lesions  Access: Rt AVF    Basic Metabolic Panel: Recent Labs  Lab 06/30/23 1008  07/01/23 0507 07/02/23 0717 07/04/23 0455 07/05/23 0558  NA 139 134* 136  --  134*  K 3.6 4.0 4.2  --  3.7  CL 108 100 104  --  97*  CO2 22 22 22   --  28  GLUCOSE 89 88 82  --  95  BUN 17 29* 31*  --  22  CREATININE 2.78* 4.05* 4.27* 3.68* 3.40*  CALCIUM 6.9* 8.2* 8.5*  --  8.7*  MG  --   --   --   --  1.8  PHOS  --   --  3.2  --   --     Liver Function Tests: Recent Labs  Lab 06/30/23 1008 07/02/23 0717  AST 18  --   ALT 9  --   ALKPHOS 57  --   BILITOT 0.5  --   PROT 6.0*  --   ALBUMIN 2.9* 2.8*   No results for input(s): "LIPASE", "AMYLASE" in the last 168 hours. No results for input(s): "AMMONIA" in the last 168 hours.  CBC: Recent Labs  Lab 06/30/23 1008 07/02/23 0717 07/04/23 0758 07/05/23 0558  WBC 8.6 7.4 6.4 6.3  NEUTROABS 7.7  --   --   --   HGB 9.9* 9.4* 9.8* 10.1*  HCT 31.1* 27.6* 28.6* 29.4*  MCV 99.4 94.8 93.2 93.0  PLT 156 161 189 198    Cardiac Enzymes: No results for input(s): "CKTOTAL", "CKMB", "CKMBINDEX", "TROPONINI" in the last 168 hours.  BNP: Invalid input(s): "POCBNP"  CBG: Recent Labs  Lab 06/30/23 1006 06/30/23 1629 07/02/23 2228 07/03/23 1538  GLUCAP 88 144* 145* 120*  Microbiology: Results for orders placed or performed during the hospital encounter of 06/30/23  Resp panel by RT-PCR (RSV, Flu A&B, Covid) Anterior Nasal Swab     Status: None   Collection Time: 06/30/23 10:08 AM   Specimen: Anterior Nasal Swab  Result Value Ref Range Status   SARS Coronavirus 2 by RT PCR NEGATIVE NEGATIVE Final    Comment: (NOTE) SARS-CoV-2 target nucleic acids are NOT DETECTED.  The SARS-CoV-2 RNA is generally detectable in upper respiratory specimens during the acute phase of infection. The lowest concentration of SARS-CoV-2 viral copies this assay can detect is 138 copies/mL. A negative result does not preclude SARS-Cov-2 infection and should not be used as the sole basis for treatment or other patient management decisions. A  negative result may occur with  improper specimen collection/handling, submission of specimen other than nasopharyngeal swab, presence of viral mutation(s) within the areas targeted by this assay, and inadequate number of viral copies(<138 copies/mL). A negative result must be combined with clinical observations, patient history, and epidemiological information. The expected result is Negative.  Fact Sheet for Patients:  BloggerCourse.com  Fact Sheet for Healthcare Providers:  SeriousBroker.it  This test is no t yet approved or cleared by the [place] FDA and  has been authorized for detection and/or diagnosis of SARS-CoV-2 by FDA under an Emergency Use Authorization (EUA). This EUA will remain  in effect (meaning this test can be used) for the duration of the COVID-19 declaration under Section 564(b)(1) of the Act, 21 U.S.C.section 360bbb-3(b)(1), unless the authorization is terminated  or revoked sooner.       Influenza A by PCR NEGATIVE NEGATIVE Final   Influenza B by PCR NEGATIVE NEGATIVE Final    Comment: (NOTE) The Xpert Xpress SARS-CoV-2/FLU/RSV plus assay is intended as an aid in the diagnosis of influenza from Nasopharyngeal swab specimens and should not be used as a sole basis for treatment. Nasal washings and aspirates are unacceptable for Xpert Xpress SARS-CoV-2/FLU/RSV testing.  Fact Sheet for Patients: BloggerCourse.com  Fact Sheet for Healthcare Providers: SeriousBroker.it  This test is not yet approved or cleared by the [place] FDA and has been authorized for detection and/or diagnosis of SARS-CoV-2 by FDA under an Emergency Use Authorization (EUA). This EUA will remain in effect (meaning this test can be used) for the duration of the COVID-19 declaration under Section 564(b)(1) of the Act, 21 U.S.C. section 360bbb-3(b)(1), unless the authorization  is terminated or revoked.     Resp Syncytial Virus by PCR NEGATIVE NEGATIVE Final    Comment: (NOTE) Fact Sheet for Patients: BloggerCourse.com  Fact Sheet for Healthcare Providers: SeriousBroker.it  This test is not yet approved or cleared by the [place] FDA and has been authorized for detection and/or diagnosis of SARS-CoV-2 by FDA under an Emergency Use Authorization (EUA). This EUA will remain in effect (meaning this test can be used) for the duration of the COVID-19 declaration under Section 564(b)(1) of the Act, 21 U.S.C. section 360bbb-3(b)(1), unless the authorization is terminated or revoked.  Performed at [facility], [address] [place], [place] [address]   Blood Culture (routine x 2)     Status: Abnormal   Collection Time: 06/30/23 10:27 AM   Specimen: BLOOD  Result Value Ref Range Status   Specimen Description   Final    BLOOD BLOOD LEFT ARM Performed at [facility], [address], [place], [place] [address]    Special Requests   Final    BOTTLES DRAWN  AEROBIC AND ANAEROBIC Blood Culture adequate volume Performed at [facility], [address] [place], [place] [address]    Culture  Setup Time   Final    GRAM POSITIVE COCCI AEROBIC BOTTLE ONLY Organism ID to follow CRITICAL RESULT CALLED TO, READ BACK BY AND VERIFIED WITH: JASON ROBBINS @ 0604 07/01/23 LFD GRAM STAIN REVIEWED-AGREE WITH RESULT Performed at [facility], [address], [place], [place] [address]    Culture ENTEROCOCCUS FAECALIS (A)  Final   Report Status 07/04/2023 FINAL  Final   Organism ID, Bacteria ENTEROCOCCUS FAECALIS  Final      Susceptibility   Enterococcus faecalis - MIC*    AMPICILLIN <=2 SENSITIVE Sensitive     VANCOMYCIN 1 SENSITIVE Sensitive     GENTAMICIN SYNERGY SENSITIVE Sensitive     * ENTEROCOCCUS FAECALIS  Blood Culture (routine x 2)     Status: None   Collection Time:  06/30/23 10:27 AM   Specimen: BLOOD  Result Value Ref Range Status   Specimen Description BLOOD LEFT ANTECUBITAL  Final   Special Requests   Final    BOTTLES DRAWN AEROBIC AND ANAEROBIC Blood Culture adequate volume   Culture   Final    NO GROWTH 5 DAYS Performed at [facility], [address] [place], [place] [address]    Report Status 07/05/2023 FINAL  Final  Urine Culture     Status: Abnormal   Collection Time: 06/30/23 10:27 AM   Specimen: Urine, Random  Result Value Ref Range Status   Specimen Description   Final    URINE, RANDOM Performed at [facility], [address] [place], [place] [address]    Special Requests   Final    NONE Reflexed from [id number] Performed at [facility], [address] [place], [place] [address]    Culture >=100,000 COLONIES/mL ENTEROCOCCUS FAECALIS (A)  Final   Report Status 07/02/2023 FINAL  Final   Organism ID, Bacteria ENTEROCOCCUS FAECALIS (A)  Final      Susceptibility   Enterococcus faecalis - MIC*    AMPICILLIN <=2 SENSITIVE Sensitive     NITROFURANTOIN <=16 SENSITIVE Sensitive     VANCOMYCIN <=0.5 SENSITIVE Sensitive     * >=100,000 COLONIES/mL ENTEROCOCCUS FAECALIS  Blood Culture ID Panel (Reflexed)     Status: Abnormal   Collection Time: 06/30/23 10:27 AM  Result Value Ref Range Status   Enterococcus faecalis DETECTED (A) NOT DETECTED Final    Comment: CRITICAL RESULT CALLED TO, READ BACK BY AND VERIFIED WITH: JASON ROBBINS @ 0604 07/01/23 LFD    Enterococcus Faecium NOT DETECTED NOT DETECTED Final   Listeria monocytogenes NOT DETECTED NOT DETECTED Final   Staphylococcus species NOT DETECTED NOT DETECTED Final   Staphylococcus aureus (BCID) NOT DETECTED NOT DETECTED Final   Staphylococcus epidermidis NOT DETECTED NOT DETECTED Final   Staphylococcus lugdunensis NOT DETECTED NOT DETECTED Final   Streptococcus species NOT DETECTED NOT DETECTED Final   Streptococcus agalactiae NOT DETECTED NOT  DETECTED Final   Streptococcus pneumoniae NOT DETECTED NOT DETECTED Final   Streptococcus pyogenes NOT DETECTED NOT DETECTED Final   A.calcoaceticus-baumannii NOT DETECTED NOT DETECTED Final   Bacteroides fragilis NOT DETECTED NOT DETECTED Final   Enterobacterales NOT DETECTED NOT DETECTED Final   Enterobacter cloacae complex NOT DETECTED NOT DETECTED Final   Escherichia coli NOT DETECTED NOT DETECTED Final   Klebsiella aerogenes NOT DETECTED NOT DETECTED Final   Klebsiella oxytoca NOT DETECTED NOT DETECTED Final   Klebsiella pneumoniae  NOT DETECTED NOT DETECTED Final   Proteus species NOT DETECTED NOT DETECTED Final   Salmonella species NOT DETECTED NOT DETECTED Final   Serratia marcescens NOT DETECTED NOT DETECTED Final   Haemophilus influenzae NOT DETECTED NOT DETECTED Final   Neisseria meningitidis NOT DETECTED NOT DETECTED Final   Pseudomonas aeruginosa NOT DETECTED NOT DETECTED Final   Stenotrophomonas maltophilia NOT DETECTED NOT DETECTED Final   Candida albicans NOT DETECTED NOT DETECTED Final   Candida auris NOT DETECTED NOT DETECTED Final   Candida glabrata NOT DETECTED NOT DETECTED Final   Candida krusei NOT DETECTED NOT DETECTED Final   Candida parapsilosis NOT DETECTED NOT DETECTED Final   Candida tropicalis NOT DETECTED NOT DETECTED Final   Cryptococcus neoformans/gattii NOT DETECTED NOT DETECTED Final   Vancomycin resistance NOT DETECTED NOT DETECTED Final    Comment: Performed at [facility], [address] [place], [place] [address]  Culture, blood (Routine X 2) w Reflex to ID Panel     Status: None (Preliminary result)   Collection Time: 07/03/23  8:31 AM   Specimen: BLOOD  Result Value Ref Range Status   Specimen Description BLOOD BLOOD LEFT HAND  Final   Special Requests   Final    BOTTLES DRAWN AEROBIC AND ANAEROBIC Blood Culture adequate volume   Culture   Final    NO GROWTH 2 DAYS Performed at [facility], [address]  [place], [place] [address]    Report Status PENDING  Incomplete  Culture, blood (Routine X 2) w Reflex to ID Panel     Status: None (Preliminary result)   Collection Time: 07/03/23  8:31 AM   Specimen: BLOOD  Result Value Ref Range Status   Specimen Description BLOOD LEFT [facility]  Final   Special Requests   Final    BOTTLES DRAWN AEROBIC AND ANAEROBIC Blood Culture adequate volume   Culture   Final    NO GROWTH 2 DAYS Performed at [facility], [address] [place], [place] [address]    Report Status PENDING  Incomplete    Coagulation Studies: No results for input(s): "LABPROT", "INR" in the last 72 hours.   Urinalysis: No results for input(s): "COLORURINE", "LABSPEC", "PHURINE", "GLUCOSEU", "HGBUR", "BILIRUBINUR", "KETONESUR", "PROTEINUR", "UROBILINOGEN", "NITRITE", "LEUKOCYTESUR" in the last 72 hours.  Invalid input(s): "APPERANCEUR"     Imaging: No results found.   Medications:    vancomycin 1,000 mg (07/04/23 1625)     aspirin EC  81 mg Oral Daily   carvedilol  25 mg Oral BID WC   heparin  5,000 Units Subcutaneous Q12H   pantoprazole  20 mg Oral Daily   rosuvastatin  10 mg Oral QPM   sertraline  100 mg Oral Daily   tamsulosin  0.4 mg Oral QHS   ondansetron **OR** ondansetron (ZOFRAN) IV  Assessment/ Plan:  Mr. Randall [name] is a 62 y.o.  male past medical conditions including hypertension, type 2 diabetes, Bell's palsy, CVA, CHF, hyperlipidemia, and end-stage renal disease on hemodialysis.  Patient presents to the emergency department complaining of weakness and confusion.  Patient has been admitted under observation Encephalopathy [G93.40] Acute cystitis with hematuria [N30.01] Altered mental status, unspecified altered mental status type [R41.82] Sepsis, due to unspecified organism, unspecified whether acute organ dysfunction present (HCC) [A41.9]   1. End stage renal disease on hemodialysis. Dialysis received yesterday, UF 2L achieved. Next  treatment scheduled for Friday.   2. Sepsis secondary due to UTI. Urine and blood culture growing Enterococcus faecalis.  Continue IV vancomycin. ID consulted   3. Anemia of chronic kidney disease Lab Results  Component Value Date   HGB 10.1 (L) 07/05/2023    Hgb at goal for renal patient, 9-11. Patient receives Mircera at outpatient clinic. Will continue to monitor.   4. Hypertension with chronic kidney disease. Home regimen includes carvedilol. Also on tamsulosin. Receiving these medications.   Blood pressure 139/60, acceptable for this patient  5. Secondary Hyperparathyroidism: with outpatient labs: none available  Lab Results  Component Value Date   CALCIUM 8.7 (L) 07/05/2023   CAION 1.19 06/15/2022   PHOS 3.2 07/02/2023    Patient receives cholecalciferol and sevelamer outpatient.Bone minerals acceptable.    LOS: 3   8/29/202411:07 AM

## 2023-07-05 NOTE — TOC Progression Note (Signed)
Transition of Care [facility]) - Progression Note    Patient Details  Name: Randall [name] MRN: [id number] Date of Birth: 09-07-1961  [facility] CM/SW Contact  Allena Katz, LCSW Phone Number: 07/05/2023, 10:01 AM  Clinical Narrative:   Pt to be on Vanc until 9/8.          Expected Discharge Plan and Services                                               Social Determinants of Health (SDOH) Interventions SDOH Screenings   Food Insecurity: No Food Insecurity (06/30/2023)  Housing: Low Risk  (06/30/2023)  Transportation Needs: No Transportation Needs (06/30/2023)  Utilities: Not At Risk (06/30/2023)  Financial Resource Strain: High Risk (01/26/2022)   Received from [facility], [facility]  Physical Activity: Insufficiently Active (08/18/2021)   Received from [facility], [facility]  Social Connections: Moderately Integrated (08/18/2021)   Received from [facility], [facility]  Stress: Stress Concern Present (01/26/2022)   Received from [facility], [facility]  Tobacco Use: Medium Risk (06/30/2023)  Health Literacy: Low Risk  (04/19/2023)   Received from [facility]    Readmission Risk Interventions     No data to display

## 2023-07-05 NOTE — Plan of Care (Signed)

## 2023-07-05 NOTE — Progress Notes (Signed)
  PROGRESS NOTE    Randall [name]  ZOX:[id number] DOB: 22-Jun-1961 DOA: 06/30/2023 PCP: Gabriel Earing, MD  120A/120A-AA  LOS: 3 days   Brief hospital course:   Assessment & Plan: Randall [name] is a 62 y.o. male  with medical history significant of ESRD on HD MWF, chronic HFpEF, HTN, advanced dementia, BPH, stroke, brought in by caretaker for evaluation of confusion and possible UTI.    Apparently, he became more confused after he had his routine hemodialysis on 06/29/2023.  His sister noticed blood stain in his underwear when he was changing the underwear.Pt was febrile in the ED with temperature 103.4 F and tachypneic with respiratory rate of 22.  He was admitted to the hospital for severe sepsis secondary to acute UTI.    Severe Sepsis secondary to  acute UTI Enterococcus faecalis bacteremia --Lactic acid was 2.8 on admission.   --BC 1/2 Enterococcus faecalis --Urine culture is growing Enterococcus faecalis.   -- ID consultation --TTE without vegetation --Bladder scan 230 cc --pt did not allow I and out cath.  Subsequent scan were 76ml, 0 ml, 74ml  Plan: --cont IV vanc.  After discharge, will get IV vanc with dialysis, end date 07/15/23   ESRD on HD:  --iHD per nephro   Chronic diastolic CHF: Compensated   Acute metabolic encephalopathy on underlying dementia:  currently appears to be at baseline.      BPH --cont flomax   Hypertension --cont coreg    history of stroke  --cont ASA and statin    As baseline- per sister who lives with patient at home patient ambulates using walker. Sister transport  pt to dialysis back-and-forth.   DVT prophylaxis: Heparin SQ Code Status: DNR  Family Communication:  Level of care: Med-Surg Dispo:   The patient is from: home Anticipated d/c is to: SNF rehab Anticipated d/c date is: whenever bed available   Subjective and Interval History:  Pt was eating his meal.  Didn't answer questions.   Objective: Vitals:    07/04/23 1952 07/05/23 0408 07/05/23 0821 07/05/23 1631  BP: (!) 128/52 136/60 139/60 (!) 140/60  Pulse: 69 61 66 62  Resp: 18 18 18 18   Temp: 98.7 F (37.1 C) 97.8 F (36.6 C) 98 F (36.7 C) 97.8 F (36.6 C)  TempSrc:  Oral  Oral  SpO2: 100% 94% 100% 98%  Weight:      Height:        Intake/Output Summary (Last 24 hours) at 07/05/2023 1928 Last data filed at 07/05/2023 1411 Gross per 24 hour  Intake 120 ml  Output --  Net 120 ml   Filed Weights   07/02/23 1245 07/04/23 0845 07/04/23 1230  Weight: 91.2 kg 98.3 kg 96 kg    Examination:   Constitutional: NAD, alert HEENT: conjunctivae and lids normal, EOMI CV: No cyanosis.   RESP: normal respiratory effort, on RA Neuro: II - XII grossly intact.     Data Reviewed: I have personally reviewed labs and imaging studies  Time spent: 35 minutes  Darlin Priestly, MD [facility] If 7PM-7AM, please contact night-coverage 07/05/2023, 7:28 PM

## 2023-07-05 NOTE — Progress Notes (Signed)
Pharmacy Antibiotic Note  Randall [name] is a 62 y.o. male admitted on 06/30/2023 with sepsis secondary to an acute UTI. Patient was brought in by caretaker for evaluation of confusion. PMH is significant for advanced dementia, ESRD on HD MWF, chronic HFpEF, HTN, stroke, & BPH. E. faecalis growing in 1 of 4 bottles, no resistance detected. Pharmacy has been consulted for vancomycin dosing for bacteremia.   Plan: 8/28 0455 vanco random level = 19 Continue vancomycin 1 gm QHD Will check a weekly vanco level and adjust appropriately   Height: 5\' 9"  (175.3 cm) Weight: 96 kg (211 lb 10.3 oz) IBW/kg (Calculated) : 70.7  Temp (24hrs), Avg:98.3 F (36.8 C), Min:97.8 F (36.6 C), Max:98.9 F (37.2 C)  Recent Labs  Lab 06/30/23 1008 06/30/23 1027 07/01/23 0507 07/02/23 0717 07/04/23 0455 07/04/23 0758 07/05/23 0558  WBC 8.6  --   --  7.4  --  6.4 6.3  CREATININE 2.78*  --  4.05* 4.27* 3.68*  --  3.40*  LATICACIDVEN 2.8* 1.1  --   --   --   --   --   VANCORANDOM  --   --   --   --  19  --   --     Estimated Creatinine Clearance: 26.1 mL/min (A) (by C-G formula based on SCr of 3.4 mg/dL (H)).    Allergies  Allergen Reactions   Penicillins     Reaction in childhood - unknown reaction Patient states he has tolerated amoxicillin.      Antimicrobials this admission: Cefepime 8/24 x 1 Metronidazole 8/24 x 1 Ceftriaxone 8/25 x 1 Vancomycin 8/24 >>   Dose adjustments this admission:  Microbiology results: 8/24 Ucx: E. Faecalis (S vanc, ampicillin) 8/24 Bcx: 1/4 E. Faecalis 8/27 Bcx: NG x 2d  Thank you for allowing pharmacy to be a part of this patient's care.  Littie Deeds, PharmD PGY1 Pharmacy Resident 07/05/2023 6:39 AM

## 2023-07-05 NOTE — Progress Notes (Signed)
Physical Therapy Treatment Patient Details Name: Randall [name] MRN: [id number] DOB: [birth year] Today's Date: 07/05/2023   History of Present Illness Randall [name] is a 61yoM who comes to [facility] on 06/30/23 c AMS, emesis, hypoxia. Pt febrile in ED 103.4. PMH: dementia, CHF, ESRD on HD MWF, DM, CVA. Pt being treated for positive blood cultures, followed by ID.    PT Comments  Author returns to room for a 2nd attempt today, focus on trial of AMB. Pt still requires minA to EOB and minA (or elevated surface) to come to standing, but is somewhat more robust in authors assessment. RN reports sitting recliner for >2 hours this AM, but pt still weak upon attempts to rise. Pt able to AMB with RW today, ~25ft, then limited by fatigue, asks to return to sitting for a recovery interval. Sister in room, attends session. She remarks preference for him to avoid placement and be able to make a direct DC to home. Author explains importance in involving her in mobility over next couple days to ascertain her ability to provide any remaining needs with physical assistance. Pt also would need to demonstrate safe ability to navigate stairs up and down so that he can get to HD appointments. Will continue to follow.    If plan is discharge home, recommend the following: Two people to help with walking and/or transfers;A little help with bathing/dressing/bathroom;Help with stairs or ramp for entrance;Assist for transportation   Can travel by private vehicle     No  Equipment Recommendations  None recommended by PT    Recommendations for Other Services       Precautions / Restrictions Precautions Precautions: Fall Restrictions Weight Bearing Restrictions: No     Mobility  Bed Mobility Overal bed mobility: Needs Assistance Bed Mobility: Supine to Sit, Sit to Supine     Supine to sit: Min assist Sit to supine: Contact guard assist        Transfers Overall transfer level: Needs assistance Equipment  used: Rolling walker (2 wheels) Transfers: Sit to/from Stand Sit to Stand: Supervision, From elevated surface           General transfer comment: elevated to facilitate goal of session    Ambulation/Gait Ambulation/Gait assistance: Contact guard assist Gait Distance (Feet): 88 Feet Assistive device: Rolling walker (2 wheels) Gait Pattern/deviations: Step-through pattern       General Gait Details: initital unsteadiness with turn toward door, then moving fairly steadily with RW. Sister watches   Stairs Stairs:  (did not attempt today; will need to attempt prior to DC should he be homebound)           Wheelchair Mobility     Tilt Bed    Modified Rankin (Stroke Patients Only)       Balance                                            Cognition Arousal: Alert Behavior During Therapy: WFL for tasks assessed/performed, Flat affect Overall Cognitive Status: Within Functional Limits for tasks assessed                                 General Comments: introverted, delayed response time, does not make eye contact, hypophonic        Exercises      General Comments  Pertinent Vitals/Pain Pain Assessment Pain Assessment: No/denies pain    Home Living Family/patient expects to be discharged to:: Private residence Living Arrangements: Spouse/significant other Available Help at Discharge: Family;Available 24 hours/day   Home Access: Stairs to enter Entrance Stairs-Rails: None Entrance Stairs-Number of Steps: 4   Home Layout: One level Home Equipment: Tub bench;Hand held shower head;BSC/3in1;Rolling Walker (2 wheels);Wheelchair - manual Additional Comments: All history provided by pt's sister at bedside, as pt has dementia and was providing incorrect information upon OT asking him. Sister is retired and IT trainer.    Prior Function            PT Goals (current goals can now be found in the care plan section)  Acute Rehab PT Goals Patient Stated Goal: regain strength to allow her to provide care in the home PT Goal Formulation: With family Time For Goal Achievement: 07/19/23 Potential to Achieve Goals: Good Progress towards PT goals: Progressing toward goals    Frequency    Min 1X/week      PT Plan      Co-evaluation              AM-PAC PT "6 Clicks" Mobility   Outcome Measure  Help needed turning from your back to your side while in a flat bed without using bedrails?: A Lot Help needed moving from lying on your back to sitting on the side of a flat bed without using bedrails?: A Lot Help needed moving to and from a bed to a chair (including a wheelchair)?: A Lot Help needed standing up from a chair using your arms (e.g., wheelchair or bedside chair)?: A Lot Help needed to walk in hospital room?: A Little Help needed climbing 3-5 steps with a railing? : A Lot 6 Click Score: 13    End of Session Equipment Utilized During Treatment: Gait belt Activity Tolerance: Patient tolerated treatment well Patient left: with call bell/phone within reach;in bed;with family/visitor present Nurse Communication: Mobility status PT Visit Diagnosis: Unsteadiness on feet (R26.81);Difficulty in walking, not elsewhere classified (R26.2);Muscle weakness (generalized) (M62.81)     Time: 8469-6295 PT Time Calculation (min) (ACUTE ONLY): 20 min  Charges:    $Therapeutic Activity: 8-22 mins PT General Charges $$ ACUTE PT VISIT: 1 Visit                    2:16 PM, 07/05/23 Rosamaria Lints, PT, DPT Physical Therapist - [facility]  [phone] (ASCOM)   Ellesse Antenucci C 07/05/2023, 2:12 PM

## 2023-07-05 NOTE — Care Management Important Message (Signed)
Important Message  Patient Details  Name: Randall [name] MRN: [id number] Date of Birth: 1961/10/05   Medicare Important Message Given:  N/A - LOS <3 / Initial given by admissions     [name] 07/05/2023, 8:04 AM

## 2023-07-05 NOTE — Treatment Plan (Signed)
Diagnosis: Enterococcus bacteremia and Uti with hematuria Baseline Creatinine ESRD    Allergies  Allergen Reactions   Penicillins     Reaction in childhood - unknown reaction Patient states he has tolerated amoxicillin.       OPAT Orders Discharge antibiotics: Vancomycin 1 gram during dialysis M-W-F For 2 weeks  End Date: 07/15/23  [facility] Care Per Protocol:  Labs weekly while on IV antibiotics: _X_ CBC with differential  _X_ CMP  _X_ Vancomycin trough   Fax weekly lab results  promptly to [phone]  Clinic Follow Up Appt:07/24/23 at 11.45Am   Call [phone] with any questions

## 2023-07-06 DIAGNOSIS — B952 Enterococcus as the cause of diseases classified elsewhere: Secondary | ICD-10-CM | POA: Diagnosis not present

## 2023-07-06 DIAGNOSIS — R6520 Severe sepsis without septic shock: Secondary | ICD-10-CM | POA: Diagnosis not present

## 2023-07-06 DIAGNOSIS — R7881 Bacteremia: Secondary | ICD-10-CM | POA: Diagnosis not present

## 2023-07-06 DIAGNOSIS — N3001 Acute cystitis with hematuria: Secondary | ICD-10-CM | POA: Diagnosis not present

## 2023-07-06 DIAGNOSIS — A419 Sepsis, unspecified organism: Secondary | ICD-10-CM | POA: Diagnosis not present

## 2023-07-06 LAB — CBC
HCT: 30 % — ABNORMAL LOW (ref 39.0–52.0)
Hemoglobin: 10.1 g/dL — ABNORMAL LOW (ref 13.0–17.0)
MCH: 32 pg (ref 26.0–34.0)
MCHC: 33.7 g/dL (ref 30.0–36.0)
MCV: 94.9 fL (ref 80.0–100.0)
Platelets: 211 10*3/uL (ref 150–400)
RBC: 3.16 MIL/uL — ABNORMAL LOW (ref 4.22–5.81)
RDW: 12.5 % (ref 11.5–15.5)
WBC: 7.1 10*3/uL (ref 4.0–10.5)
nRBC: 0 % (ref 0.0–0.2)

## 2023-07-06 LAB — BASIC METABOLIC PANEL
Anion gap: 12 (ref 5–15)
BUN: 32 mg/dL — ABNORMAL HIGH (ref 8–23)
CO2: 28 mmol/L (ref 22–32)
Calcium: 8.6 mg/dL — ABNORMAL LOW (ref 8.9–10.3)
Chloride: 94 mmol/L — ABNORMAL LOW (ref 98–111)
Creatinine, Ser: 4.36 mg/dL — ABNORMAL HIGH (ref 0.61–1.24)
GFR, Estimated: 15 mL/min — ABNORMAL LOW (ref >60)
Glucose, Bld: 88 mg/dL (ref 70–99)
Potassium: 3.4 mmol/L — ABNORMAL LOW (ref 3.5–5.1)
Sodium: 134 mmol/L — ABNORMAL LOW (ref 135–145)

## 2023-07-06 LAB — MAGNESIUM: Magnesium: 1.9 mg/dL (ref 1.7–2.4)

## 2023-07-06 MED ORDER — HALOPERIDOL LACTATE 5 MG/ML IJ SOLN: 1.0000 mg | Freq: Four times a day (QID) | INTRAMUSCULAR | Status: DC | PRN

## 2023-07-06 MED ORDER — QUETIAPINE FUMARATE 25 MG PO TABS
50.0000 mg | ORAL_TABLET | Freq: Every day | ORAL | Status: DC
  Administered 2023-07-06: 50 mg via ORAL
  Filled 2023-07-06: qty 2

## 2023-07-06 NOTE — Progress Notes (Signed)
  Received patient in bed to unit.   Informed consent signed and in chart.    TX duration  3.5hrs     Transported back to floor  Hand-off given to patient's nurse. No c/o and no distress noted   pt confused and agitated at time of take off   Access used: R AVF Access issues: none   Total UF removed: 2.0L Medication(s) given: 1GM vancomycin Post HD VS 153/70 Post HD weight: 87.2kg     Lynann Beaver  [facility]

## 2023-07-06 NOTE — Care Management Important Message (Signed)
Important Message  Patient Details  Name: Randall [name] MRN: [id number] Date of Birth: [birth year]   Medicare Important Message Given:  Yes  Patient in dialysis but left a copy on his bedside table.   Olegario Messier A Bradon Fester 07/06/2023, 12:38 PM

## 2023-07-06 NOTE — Progress Notes (Addendum)
Pt came back from from Dialysis upset because he hasn't ate all day. Will return to administer medication once Pt hasnt food on stomach.

## 2023-07-06 NOTE — [place] FL2 (Signed)
[place] MEDICAID FL2 LEVEL OF CARE FORM     IDENTIFICATION  Patient Name: Randall [name] Birthdate: September 30, 1961 Sex: male Admission Date (Current Location): 06/30/2023  [facility] and [place] Number:  Chiropodist and Address:  [facility], [address], [place], [place] [address]      Provider Number: [id number]  Attending Physician Name and Address:  Darlin Priestly, MD  Relative Name and Phone Number:  Ryann, [place] (Sister)  [phone]    Current Level of Care: Hospital Recommended Level of Care: Skilled Nursing Facility Prior Approval Number:    Date Approved/Denied:   PASRR Number: pending  Discharge Plan:      Current Diagnoses: Patient Active Problem List   Diagnosis Date Noted   Bacteremia due to Enterococcus 07/04/2023   Severe sepsis (HCC) 07/01/2023   Encephalopathy 06/30/2023   Syncope and collapse 02/08/2023   Hyponatremia 02/07/2023   Acute encephalopathy 02/04/2023   COVID-19 virus infection 02/04/2023   Weakness on left side of face 02/04/2023   Type 2 diabetes mellitus (HCC) 02/04/2023   ESRD on dialysis (HCC) 06/06/2022   Hyperlipidemia 06/06/2022   Gastroesophageal reflux disease 03/07/2022   Duodenitis 03/07/2022   Frailty 03/02/2022   Essential hypertension 12/31/2021   Intractable nausea and vomiting 12/24/2021   Recurrent vomiting 12/23/2021   Acute renal failure superimposed on stage 3a chronic kidney disease (HCC) 12/23/2021   Leukocytosis 12/23/2021   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   UTI (urinary tract infection) 12/10/2021   Dementia with behavioral disturbance (HCC) 12/09/2021   Acute metabolic encephalopathy 12/09/2021   Nausea and vomiting 12/09/2021   Elevated troponin 12/09/2021   Chronic systolic CHF (congestive heart failure) (HCC) 12/09/2021   History of CVA (cerebrovascular accident) 12/09/2021   Stage 3a chronic kidney disease (HCC) 12/09/2021   Elevated lipase 12/09/2021    Esophageal stenosis 03/02/2015   Hemorrhagic cerebrovascular accident (CVA) (HCC) 02/16/2015   CVA (cerebral vascular accident) (HCC) 11/06/2009    Orientation RESPIRATION BLADDER Height & Weight        Normal Incontinent Weight: 192 lb 3.9 oz (87.2 kg) Height:  5\' 9"  (175.3 cm)  BEHAVIORAL SYMPTOMS/MOOD NEUROLOGICAL BOWEL NUTRITION STATUS      Continent    AMBULATORY STATUS COMMUNICATION OF NEEDS Skin   Extensive Assist Verbally Normal                       Personal Care Assistance Level of Assistance  Bathing, Feeding, Dressing Bathing Assistance: Maximum assistance Feeding assistance: Limited assistance Dressing Assistance: Maximum assistance     Functional Limitations Info  Sight, Speech, Hearing Sight Info: Adequate Hearing Info: Adequate Speech Info: Adequate    SPECIAL CARE FACTORS FREQUENCY  PT (By licensed PT), OT (By licensed OT)     PT Frequency: 5 times a week OT Frequency: 5 times a week            Contractures Contractures Info: Not present    Additional Factors Info  Code Status, Allergies Code Status Info: FNR Allergies Info: Penicillins           Current Medications (07/06/2023):  This is the current hospital active medication list Current Facility-Administered Medications  Medication Dose Route Frequency Provider Last Rate Last Admin   aspirin EC tablet 81 mg  81 mg Oral Daily Mikey College T, MD   81 mg at 07/05/23 0909   carvedilol (COREG) tablet 25 mg  25 mg Oral BID WC Barrie Folk, [facility]  25 mg at 07/05/23 1736   heparin injection 5,000 Units  5,000 Units Subcutaneous Q12H Mikey College T, MD   5,000 Units at 07/05/23 2127   ondansetron [facility]) tablet 4 mg  4 mg Oral Q6H PRN Mikey College T, MD       Or   ondansetron [facility]) injection 4 mg  4 mg Intravenous Q6H PRN Mikey College T, MD   4 mg at 07/05/23 0934   pantoprazole (PROTONIX) EC tablet 20 mg  20 mg Oral Daily Mikey College T, MD   20 mg at 07/05/23 0912   rosuvastatin  (CRESTOR) tablet 10 mg  10 mg Oral QPM Mikey College T, MD   10 mg at 07/05/23 1736   sertraline (ZOLOFT) tablet 100 mg  100 mg Oral Daily Mikey College T, MD   100 mg at 07/05/23 0910   tamsulosin (FLOMAX) capsule 0.4 mg  0.4 mg Oral QHS Marrion Coy, MD   0.4 mg at 07/05/23 2128   vancomycin (VANCOCIN) IVPB 1000 mg/200 mL premix  1,000 mg Intravenous Q M,W,F-HD Jaynie Bream, [facility]   Stopped at 07/06/23 1306     Discharge Medications: Please see discharge summary for a list of discharge medications.  Relevant Imaging Results:  Relevant Lab Results:   Additional Information SS # [id number] Pt is on Dialysis [facility] MWF, Pt also on antibiotics.  Giorgi Debruin Genelle Gather, LCSW

## 2023-07-06 NOTE — Progress Notes (Addendum)
[facility]  ROUNDING NOTE   Subjective:   Randall [name]  is a 62 year old male with past medical conditions including hypertension, type 2 diabetes, Bell's palsy, CVA, CHF, hyperlipidemia, and end-stage renal disease on hemodialysis.  Patient presents to the emergency department complaining of weakness and confusion.  Patient has been admitted under observation Encephalopathy [G93.40] Acute cystitis with hematuria [N30.01] Altered mental status, unspecified altered mental status type [R41.82] Sepsis, due to unspecified organism, unspecified whether acute organ dysfunction present [facility]) [A41.9]  Patient is known to our practice and receives outpatient dialysis treatments at [facility] on a MWF schedule, supervised by Dr. Cherylann Ratel.   Patient seen and evaluated during dialysis   HEMODIALYSIS FLOWSHEET:  Blood Flow Rate (mL/min): 350 mL/min Arterial Pressure (mmHg): -89.29 mmHg Venous Pressure (mmHg): 165.45 mmHg TMP (mmHg): 21.41 mmHg Ultrafiltration Rate (mL/min): 772 mL/min Dialysate Flow Rate (mL/min): 299 ml/min  Tolerating treatment well No complaints to offer   Objective:  Vital signs in last 24 hours:  Temp:  [97.8 F (36.6 C)-98.5 F (36.9 C)] 98.2 F (36.8 C) (08/30 0820) Pulse Rate:  [56-82] 82 (08/30 1200) Resp:  [12-20] 18 (08/30 1200) BP: (108-158)/(54-121) 129/59 (08/30 1200) SpO2:  [95 %-100 %] 97 % (08/30 1200) Weight:  [89.1 kg] 89.1 kg (08/30 0821)  Weight change:  Filed Weights   07/04/23 0845 07/04/23 1230 07/06/23 0821  Weight: 98.3 kg 96 kg 89.1 kg    Intake/Output: I/O last 3 completed shifts: In: 120 [P.O.:120] Out: -    Intake/Output this shift:  No intake/output data recorded.  Physical Exam: General: NAD  Head: Normocephalic, atraumatic. Moist oral mucosal membranes  Eyes: Anicteric  Lungs:  Clear to auscultation, normal effort  Heart: Regular rate and rhythm  Abdomen:  Soft, nontender  Extremities:  No  peripheral edema.  Neurologic: Alert with delayed response, moving all four extremities  Skin: No lesions  Access: Rt AVF    Basic Metabolic Panel: Recent Labs  Lab 06/30/23 1008 07/01/23 0507 07/02/23 0717 07/04/23 0455 07/05/23 0558 07/06/23 0347  NA 139 134* 136  --  134* 134*  K 3.6 4.0 4.2  --  3.7 3.4*  CL 108 100 104  --  97* 94*  CO2 22 22 22   --  28 28  GLUCOSE 89 88 82  --  95 88  BUN 17 29* 31*  --  22 32*  CREATININE 2.78* 4.05* 4.27* 3.68* 3.40* 4.36*  CALCIUM 6.9* 8.2* 8.5*  --  8.7* 8.6*  MG  --   --   --   --  1.8 1.9  PHOS  --   --  3.2  --   --   --     Liver Function Tests: Recent Labs  Lab 06/30/23 1008 07/02/23 0717  AST 18  --   ALT 9  --   ALKPHOS 57  --   BILITOT 0.5  --   PROT 6.0*  --   ALBUMIN 2.9* 2.8*   No results for input(s): "LIPASE", "AMYLASE" in the last 168 hours. No results for input(s): "AMMONIA" in the last 168 hours.  CBC: Recent Labs  Lab 06/30/23 1008 07/02/23 0717 07/04/23 0758 07/05/23 0558 07/06/23 0347  WBC 8.6 7.4 6.4 6.3 7.1  NEUTROABS 7.7  --   --   --   --   HGB 9.9* 9.4* 9.8* 10.1* 10.1*  HCT 31.1* 27.6* 28.6* 29.4* 30.0*  MCV 99.4 94.8 93.2 93.0 94.9  PLT 156 161 189 198  211    Cardiac Enzymes: No results for input(s): "CKTOTAL", "CKMB", "CKMBINDEX", "TROPONINI" in the last 168 hours.  BNP: Invalid input(s): "POCBNP"  CBG: Recent Labs  Lab 06/30/23 1006 06/30/23 1629 07/02/23 2228 07/03/23 1538  GLUCAP 88 144* 145* 120*    Microbiology: Results for orders placed or performed during the hospital encounter of 06/30/23  Resp panel by RT-PCR (RSV, Flu A&B, Covid) Anterior Nasal Swab     Status: None   Collection Time: 06/30/23 10:08 AM   Specimen: Anterior Nasal Swab  Result Value Ref Range Status   SARS Coronavirus 2 by RT PCR NEGATIVE NEGATIVE Final    Comment: (NOTE) SARS-CoV-2 target nucleic acids are NOT DETECTED.  The SARS-CoV-2 RNA is generally detectable in upper  respiratory specimens during the acute phase of infection. The lowest concentration of SARS-CoV-2 viral copies this assay can detect is 138 copies/mL. A negative result does not preclude SARS-Cov-2 infection and should not be used as the sole basis for treatment or other patient management decisions. A negative result may occur with  improper specimen collection/handling, submission of specimen other than nasopharyngeal swab, presence of viral mutation(s) within the areas targeted by this assay, and inadequate number of viral copies(<138 copies/mL). A negative result must be combined with clinical observations, patient history, and epidemiological information. The expected result is Negative.  Fact Sheet for Patients:  BloggerCourse.com  Fact Sheet for Healthcare Providers:  SeriousBroker.it  This test is no t yet approved or cleared by the [place] FDA and  has been authorized for detection and/or diagnosis of SARS-CoV-2 by FDA under an Emergency Use Authorization (EUA). This EUA will remain  in effect (meaning this test can be used) for the duration of the COVID-19 declaration under Section 564(b)(1) of the Act, 21 U.S.C.section 360bbb-3(b)(1), unless the authorization is terminated  or revoked sooner.       Influenza A by PCR NEGATIVE NEGATIVE Final   Influenza B by PCR NEGATIVE NEGATIVE Final    Comment: (NOTE) The Xpert Xpress SARS-CoV-2/FLU/RSV plus assay is intended as an aid in the diagnosis of influenza from Nasopharyngeal swab specimens and should not be used as a sole basis for treatment. Nasal washings and aspirates are unacceptable for Xpert Xpress SARS-CoV-2/FLU/RSV testing.  Fact Sheet for Patients: BloggerCourse.com  Fact Sheet for Healthcare Providers: SeriousBroker.it  This test is not yet approved or cleared by the [place] FDA and has been  authorized for detection and/or diagnosis of SARS-CoV-2 by FDA under an Emergency Use Authorization (EUA). This EUA will remain in effect (meaning this test can be used) for the duration of the COVID-19 declaration under Section 564(b)(1) of the Act, 21 U.S.C. section 360bbb-3(b)(1), unless the authorization is terminated or revoked.     Resp Syncytial Virus by PCR NEGATIVE NEGATIVE Final    Comment: (NOTE) Fact Sheet for Patients: BloggerCourse.com  Fact Sheet for Healthcare Providers: SeriousBroker.it  This test is not yet approved or cleared by the [place] FDA and has been authorized for detection and/or diagnosis of SARS-CoV-2 by FDA under an Emergency Use Authorization (EUA). This EUA will remain in effect (meaning this test can be used) for the duration of the COVID-19 declaration under Section 564(b)(1) of the Act, 21 U.S.C. section 360bbb-3(b)(1), unless the authorization is terminated or revoked.  Performed at [facility], [address], [place], [place] [address]   Blood Culture (routine x 2)     Status: Abnormal   Collection Time: 06/30/23 10:27 AM  Specimen: BLOOD  Result Value Ref Range Status   Specimen Description   Final    BLOOD BLOOD LEFT ARM Performed at [facility], [address] [place], [place] [address]    Special Requests   Final    BOTTLES DRAWN AEROBIC AND ANAEROBIC Blood Culture adequate volume Performed at [facility], [address] [place], [place] [address]    Culture  Setup Time   Final    GRAM POSITIVE COCCI AEROBIC BOTTLE ONLY Organism ID to follow CRITICAL RESULT CALLED TO, READ BACK BY AND VERIFIED WITH: JASON ROBBINS @ 0604 07/01/23 LFD GRAM STAIN REVIEWED-AGREE WITH RESULT Performed at [facility], [address], [place], [place] [address]    Culture ENTEROCOCCUS FAECALIS (A)  Final   Report Status 07/04/2023 FINAL  Final    Organism ID, Bacteria ENTEROCOCCUS FAECALIS  Final      Susceptibility   Enterococcus faecalis - MIC*    AMPICILLIN <=2 SENSITIVE Sensitive     VANCOMYCIN 1 SENSITIVE Sensitive     GENTAMICIN SYNERGY SENSITIVE Sensitive     * ENTEROCOCCUS FAECALIS  Blood Culture (routine x 2)     Status: None   Collection Time: 06/30/23 10:27 AM   Specimen: BLOOD  Result Value Ref Range Status   Specimen Description BLOOD LEFT ANTECUBITAL  Final   Special Requests   Final    BOTTLES DRAWN AEROBIC AND ANAEROBIC Blood Culture adequate volume   Culture   Final    NO GROWTH 5 DAYS Performed at [facility], [address] [place], [place] [address]    Report Status 07/05/2023 FINAL  Final  Urine Culture     Status: Abnormal   Collection Time: 06/30/23 10:27 AM   Specimen: Urine, Random  Result Value Ref Range Status   Specimen Description   Final    URINE, RANDOM Performed at [facility], [address] [place], [place] [address]    Special Requests   Final    NONE Reflexed from [id number] Performed at [facility], [address] [place], [place] [address]    Culture >=100,000 COLONIES/mL ENTEROCOCCUS FAECALIS (A)  Final   Report Status 07/02/2023 FINAL  Final   Organism ID, Bacteria ENTEROCOCCUS FAECALIS (A)  Final      Susceptibility   Enterococcus faecalis - MIC*    AMPICILLIN <=2 SENSITIVE Sensitive     NITROFURANTOIN <=16 SENSITIVE Sensitive     VANCOMYCIN <=0.5 SENSITIVE Sensitive     * >=100,000 COLONIES/mL ENTEROCOCCUS FAECALIS  Blood Culture ID Panel (Reflexed)     Status: Abnormal   Collection Time: 06/30/23 10:27 AM  Result Value Ref Range Status   Enterococcus faecalis DETECTED (A) NOT DETECTED Final    Comment: CRITICAL RESULT CALLED TO, READ BACK BY AND VERIFIED WITH: JASON ROBBINS @ 0604 07/01/23 LFD    Enterococcus Faecium NOT DETECTED NOT DETECTED Final   Listeria monocytogenes NOT DETECTED NOT DETECTED Final   Staphylococcus species NOT  DETECTED NOT DETECTED Final   Staphylococcus aureus (BCID) NOT DETECTED NOT DETECTED Final   Staphylococcus epidermidis NOT DETECTED NOT DETECTED Final   Staphylococcus lugdunensis NOT DETECTED NOT DETECTED Final   Streptococcus species NOT DETECTED NOT DETECTED Final   Streptococcus agalactiae NOT DETECTED NOT DETECTED Final   Streptococcus pneumoniae NOT DETECTED NOT DETECTED Final   Streptococcus pyogenes NOT DETECTED NOT DETECTED Final   A.calcoaceticus-baumannii NOT DETECTED NOT DETECTED Final   Bacteroides fragilis NOT DETECTED NOT DETECTED Final  Enterobacterales NOT DETECTED NOT DETECTED Final   Enterobacter cloacae complex NOT DETECTED NOT DETECTED Final   Escherichia coli NOT DETECTED NOT DETECTED Final   Klebsiella aerogenes NOT DETECTED NOT DETECTED Final   Klebsiella oxytoca NOT DETECTED NOT DETECTED Final   Klebsiella pneumoniae NOT DETECTED NOT DETECTED Final   Proteus species NOT DETECTED NOT DETECTED Final   Salmonella species NOT DETECTED NOT DETECTED Final   Serratia marcescens NOT DETECTED NOT DETECTED Final   Haemophilus influenzae NOT DETECTED NOT DETECTED Final   Neisseria meningitidis NOT DETECTED NOT DETECTED Final   Pseudomonas aeruginosa NOT DETECTED NOT DETECTED Final   Stenotrophomonas maltophilia NOT DETECTED NOT DETECTED Final   Candida albicans NOT DETECTED NOT DETECTED Final   Candida auris NOT DETECTED NOT DETECTED Final   Candida glabrata NOT DETECTED NOT DETECTED Final   Candida krusei NOT DETECTED NOT DETECTED Final   Candida parapsilosis NOT DETECTED NOT DETECTED Final   Candida tropicalis NOT DETECTED NOT DETECTED Final   Cryptococcus neoformans/gattii NOT DETECTED NOT DETECTED Final   Vancomycin resistance NOT DETECTED NOT DETECTED Final    Comment: Performed at [facility], [address] [place], [place] [address]  Culture, blood (Routine X 2) w Reflex to ID Panel     Status: None (Preliminary result)   Collection Time: 07/03/23   8:31 AM   Specimen: BLOOD  Result Value Ref Range Status   Specimen Description BLOOD BLOOD LEFT HAND  Final   Special Requests   Final    BOTTLES DRAWN AEROBIC AND ANAEROBIC Blood Culture adequate volume   Culture   Final    NO GROWTH 3 DAYS Performed at [facility], [address] [place], [place] [address]    Report Status PENDING  Incomplete  Culture, blood (Routine X 2) w Reflex to ID Panel     Status: None (Preliminary result)   Collection Time: 07/03/23  8:31 AM   Specimen: BLOOD  Result Value Ref Range Status   Specimen Description BLOOD LEFT [facility]  Final   Special Requests   Final    BOTTLES DRAWN AEROBIC AND ANAEROBIC Blood Culture adequate volume   Culture   Final    NO GROWTH 3 DAYS Performed at [facility], [address] [place], [place] [address]    Report Status PENDING  Incomplete    Coagulation Studies: No results for input(s): "LABPROT", "INR" in the last 72 hours.   Urinalysis: No results for input(s): "COLORURINE", "LABSPEC", "PHURINE", "GLUCOSEU", "HGBUR", "BILIRUBINUR", "KETONESUR", "PROTEINUR", "UROBILINOGEN", "NITRITE", "LEUKOCYTESUR" in the last 72 hours.  Invalid input(s): "APPERANCEUR"     Imaging: No results found.   Medications:    vancomycin 1,000 mg (07/06/23 1102)     aspirin EC  81 mg Oral Daily   carvedilol  25 mg Oral BID WC   heparin  5,000 Units Subcutaneous Q12H   pantoprazole  20 mg Oral Daily   rosuvastatin  10 mg Oral QPM   sertraline  100 mg Oral Daily   tamsulosin  0.4 mg Oral QHS   heparin, lidocaine-prilocaine, ondansetron **OR** ondansetron (ZOFRAN) IV, pentafluoroprop-tetrafluoroeth  Assessment/ Plan:  Mr. Randall [name] is a 62 y.o.  male past medical conditions including hypertension, type 2 diabetes, Bell's palsy, CVA, CHF, hyperlipidemia, and end-stage renal disease on hemodialysis.  Patient presents to the emergency department complaining of weakness and confusion.  Patient has been  admitted under observation Encephalopathy [G93.40] Acute cystitis with hematuria [N30.01] Altered mental status, unspecified altered mental status  type [R41.82] Sepsis, due to unspecified organism, unspecified whether acute organ dysfunction present (HCC) [A41.9]   1. End stage renal disease on hemodialysis. Receiving dialysis today, UF goal 1.5-2L as tolerated. Next treatment scheduled for Monday. Will require Vancomycin 1g with dialysis until 07/15/23.   2. Sepsis secondary due to UTI. Urine and blood culture growing Enterococcus faecalis. Continue IV vancomycin per ID recs.  3. Anemia of chronic kidney disease Lab Results  Component Value Date   HGB 10.1 (L) 07/06/2023    Patient receives Mircera at outpatient clinic. Hemoglobin acceptable   4. Hypertension with chronic kidney disease. Home regimen includes carvedilol. Also on tamsulosin. Receiving these medications.   Blood pressure 145/63 during dialysis.   5. Secondary Hyperparathyroidism: with outpatient labs: none available  Lab Results  Component Value Date   CALCIUM 8.6 (L) 07/06/2023   CAION 1.19 06/15/2022   PHOS 3.2 07/02/2023    Patient receives cholecalciferol and sevelamer outpatient. Calcium and phosphorus within goal.    LOS: 4   8/30/202412:25 PM

## 2023-07-06 NOTE — TOC PASRR Note (Signed)
RE: Shubham Kniffin Date of Birth: 01/04/1961 Date: 07/06/2023     To Whom It May Concern:   Please be advised that the above-named patient will require a short-term nursing home stay - anticipated 30 days or less for rehabilitation and strengthening.  The plan is for return home

## 2023-07-06 NOTE — Progress Notes (Signed)
Occupational Therapy Treatment Patient Details Name: Randall [name] MRN: [id number] DOB: Jul 11, 1961 Today's Date: 07/06/2023   History of present illness Callon Herridge is a 61yoM who comes to [facility] on 06/30/23 c AMS, emesis, hypoxia. Pt febrile in ED 103.4. PMH: dementia, CHF, ESRD on HD MWF, DM, CVA. Pt being treated for positive blood cultures, followed by ID.   OT comments  Upon entering the room, pt supine in bed with lunch tray in front of him. Pt calling out for his sister and talking to her but she is not in room. OT mentions this several times during session and pt continues to do it throughout. Pt appears to be attempting to use straw like spoon in meal and is pocketing food. Pt cued for 10 minutes to chew and swallow food pocketed in L cheek and ultimately therapist having to get pt to spit it all out for safety. Pt performing supine >sit with min A and transfers to recliner chair with RW and min A secondary to posterior bias. Chair alarm activated. RN and MD notified of swallowing concerns and increased confusion of pt.       If plan is discharge home, recommend the following:  A little help with walking and/or transfers;A lot of help with bathing/dressing/bathroom;Assistance with cooking/housework;Direct supervision/assist for medications management;Direct supervision/assist for financial management;Assist for transportation;Help with stairs or ramp for entrance;Supervision due to cognitive status   Equipment Recommendations  None recommended by OT       Precautions / Restrictions Precautions Precautions: Fall       Mobility Bed Mobility Overal bed mobility: Needs Assistance Bed Mobility: Supine to Sit     Supine to sit: Min assist          Transfers Overall transfer level: Needs assistance Equipment used: Rolling walker (2 wheels) Transfers: Sit to/from Stand Sit to Stand: Min assist                 Balance Overall balance assessment: Needs  assistance Sitting-balance support: Feet supported Sitting balance-Leahy Scale: Fair   Postural control: Posterior lean Standing balance support: During functional activity, Reliant on assistive device for balance, Bilateral upper extremity supported Standing balance-Leahy Scale: Poor                             ADL either performed or assessed with clinical judgement   ADL Overall ADL's : Needs assistance/impaired                                       General ADL Comments: Pt needing cuing to chew and swallow food pocketed in his L cheek. He was unable to swallow and pt had to spit food out with cuing from therapist for safety. RN and MD notified.    Extremity/Trunk Assessment Upper Extremity Assessment Upper Extremity Assessment: Generalized weakness   Lower Extremity Assessment Lower Extremity Assessment: Generalized weakness        Vision Patient Visual Report: No change from baseline            Cognition Arousal: Alert Behavior During Therapy: Flat affect Overall Cognitive Status: No family/caregiver present to determine baseline cognitive functioning                                 General Comments: confused, delayed  responses, difficulty following commands                   Pertinent Vitals/ Pain       Pain Assessment Pain Assessment: No/denies pain         Frequency  Min 1X/week        Progress Toward Goals  OT Goals(current goals can now be found in the care plan section)  Progress towards OT goals: Progressing toward goals      AM-PAC OT "6 Clicks" Daily Activity     Outcome Measure   Help from another person eating meals?: A Little Help from another person taking care of personal grooming?: A Little Help from another person toileting, which includes using toliet, bedpan, or urinal?: A Little Help from another person bathing (including washing, rinsing, drying)?: A Lot Help from another  person to put on and taking off regular upper body clothing?: A Little Help from another person to put on and taking off regular lower body clothing?: A Lot 6 Click Score: 16    End of Session Equipment Utilized During Treatment: Rolling walker (2 wheels)  OT Visit Diagnosis: Unsteadiness on feet (R26.81);Muscle weakness (generalized) (M62.81)   Activity Tolerance Patient tolerated treatment well   Patient Left with call bell/phone within reach;in chair;with chair alarm set   Nurse Communication Mobility status;Other (comment) (notified RN and MD about concerns related to swallowing and feeding secondary to confusion)        Time: 2956-2130 OT Time Calculation (min): 23 min  Charges: OT General Charges $OT Visit: 1 Visit OT Treatments $Self Care/Home Management : 23-37 mins  Jackquline Denmark, MS, OTR/L , CBIS ascom [phone]  07/06/23, 4:21 PM

## 2023-07-06 NOTE — Progress Notes (Signed)
Pharmacy Antibiotic Note  Randall [name] is a 62 y.o. male admitted on 06/30/2023 with sepsis secondary to an acute UTI. Patient was brought in by caretaker for evaluation of confusion. PMH is significant for advanced dementia, ESRD on HD MWF, chronic HFpEF, HTN, stroke, & BPH. E. faecalis growing in 1 of 4 bottles, no resistance detected. Pharmacy has been consulted for vancomycin dosing for bacteremia.   Plan: 8/28 0455 vanco random level = 19 Continue vancomycin 1 gm QHD Plan for weekly vanco levels  Height: 5\' 9"  (175.3 cm) Weight: 89.1 kg (196 lb 6.9 oz) IBW/kg (Calculated) : 70.7  Temp (24hrs), Avg:98.1 F (36.7 C), Min:97.8 F (36.6 C), Max:98.5 F (36.9 C)  Recent Labs  Lab 06/30/23 1008 06/30/23 1027 07/01/23 0507 07/02/23 0717 07/04/23 0455 07/04/23 0758 07/05/23 0558 07/06/23 0347  WBC 8.6  --   --  7.4  --  6.4 6.3 7.1  CREATININE 2.78*  --  4.05* 4.27* 3.68*  --  3.40* 4.36*  LATICACIDVEN 2.8* 1.1  --   --   --   --   --   --   VANCORANDOM  --   --   --   --  19  --   --   --     Estimated Creatinine Clearance: 19.7 mL/min (A) (by C-G formula based on SCr of 4.36 mg/dL (H)).    Allergies  Allergen Reactions   Penicillins     Reaction in childhood - unknown reaction Patient states he has tolerated amoxicillin.      Antimicrobials this admission: Cefepime 8/24 x 1 Metronidazole 8/24 x 1 Ceftriaxone 8/25 x 1 Vancomycin 8/24 >>   Dose adjustments this admission:  Microbiology results: 8/24 Ucx: E. Faecalis (S vanc, ampicillin) 8/24 Bcx: 1/4 E. Faecalis 8/27 Bcx: NG x 2d  Thank you for allowing pharmacy to be a part of this patient's care.  Elliot Gurney, PharmD, BCPS Clinical Pharmacist  07/06/2023 10:29 AM

## 2023-07-06 NOTE — Progress Notes (Signed)
  PROGRESS NOTE    Randall [name]  ZOX:[id number] DOB: 1961-07-18 DOA: 06/30/2023 PCP: Gabriel Earing, MD  120A/120A-AA  LOS: 4 days   Brief hospital course:   Assessment & Plan: Randall [name] is a 62 y.o. male  with medical history significant of ESRD on HD MWF, chronic HFpEF, HTN, advanced dementia, BPH, stroke, brought in by caretaker for evaluation of confusion and possible UTI.    Apparently, he became more confused after he had his routine hemodialysis on 06/29/2023.  His sister noticed blood stain in his underwear when he was changing the underwear.Pt was febrile in the ED with temperature 103.4 F and tachypneic with respiratory rate of 22.  He was admitted to the hospital for severe sepsis secondary to acute UTI.    Severe Sepsis secondary to  acute UTI Enterococcus faecalis bacteremia --Lactic acid was 2.8 on admission.   --BC 1/2 Enterococcus faecalis --Urine culture is growing Enterococcus faecalis.   -- ID consultation --TTE without vegetation --Bladder scan 230 cc --pt did not allow I and out cath.  Subsequent scan were 76ml, 0 ml, 74ml  Plan: --cont IV vanc.  After discharge, will get IV vanc with dialysis, end date 07/15/23   ESRD on HD:  --iHD per nephro   Chronic diastolic CHF: Compensated   Acute metabolic encephalopathy  Advanced dementia:  Hospital delirium --acute metabolic encephalopathy on presentation due to sepsis.   --became more confused and agitated this morning during dialysis likely developing hospital delirium. Plan: --sister will accompany pt as much as possible --start seroquel 50 mg nightly --IV haldol PRN   BPH --cont flomax   Hypertension --cont coreg    history of stroke  --cont ASA and statin    As baseline- per sister who lives with patient at home patient ambulates using walker. Sister transport  pt to dialysis back-and-forth.   DVT prophylaxis: Heparin SQ Code Status: DNR  Family Communication:  Level of care:  Med-Surg Dispo:   The patient is from: home Anticipated d/c is to: SNF rehab Anticipated d/c date is: whenever bed available   Subjective and Interval History:  Pt was confused and agitated during dialysis this morning.     Objective: Vitals:   07/06/23 1222 07/06/23 1246 07/06/23 1330 07/06/23 1632  BP: (!) 153/70  130/77 100/78  Pulse: 78  82 77  Resp: 10  16 16   Temp: 97.8 F (36.6 C)  97.9 F (36.6 C) 98.5 F (36.9 C)  TempSrc: Oral     SpO2: 98%  100% 99%  Weight:  87.2 kg    Height:        Intake/Output Summary (Last 24 hours) at 07/06/2023 1714 Last data filed at 07/06/2023 1222 Gross per 24 hour  Intake --  Output 2000 ml  Net -2000 ml   Filed Weights   07/04/23 1230 07/06/23 0821 07/06/23 1246  Weight: 96 kg 89.1 kg 87.2 kg    Examination:   Constitutional: NAD, alert, not oriented HEENT: conjunctivae and lids normal, EOMI CV: No cyanosis.   RESP: normal respiratory effort, on RA Neuro: II - XII grossly intact.     Data Reviewed: I have personally reviewed labs and imaging studies  Time spent: 35 minutes  Darlin Priestly, MD [facility] If 7PM-7AM, please contact night-coverage 07/06/2023, 5:14 PM

## 2023-07-06 NOTE — Progress Notes (Signed)
Date of Admission:  06/30/2023     ID: Randall [name] is a 62 y.o. male  Principal Problem:   Severe sepsis (HCC) Active Problems:   UTI (urinary tract infection)   ESRD on dialysis (HCC)   Acute encephalopathy   Encephalopathy   Bacteremia due to Enterococcus    Subjective: No specific complaints Says he is okay- he is a limited historian Sister at bed side Medications:   aspirin EC  81 mg Oral Daily   carvedilol  25 mg Oral BID WC   heparin  5,000 Units Subcutaneous Q12H   pantoprazole  20 mg Oral Daily   QUEtiapine  50 mg Oral QHS   rosuvastatin  10 mg Oral QPM   sertraline  100 mg Oral Daily   tamsulosin  0.4 mg Oral QHS    Objective: Vital signs in last 24 hours: Patient Vitals for the past 24 hrs:  BP Temp Temp src Pulse Resp SpO2 Weight  07/06/23 1632 100/78 98.5 F (36.9 C) -- 77 16 99 % --  07/06/23 1330 130/77 97.9 F (36.6 C) -- 82 16 100 % --  07/06/23 1246 -- -- -- -- -- -- 87.2 kg  07/06/23 1222 (!) 153/70 97.8 F (36.6 C) Oral 78 10 98 % --  07/06/23 1200 (!) 129/59 -- -- 82 18 97 % --  07/06/23 1130 (!) 122/54 -- -- -- 16 98 % --  07/06/23 1030 108/61 -- -- 73 13 99 % --  07/06/23 1000 133/62 -- -- 73 16 97 % --  07/06/23 0930 127/62 -- -- 69 13 97 % --  07/06/23 0900 (!) 158/121 -- -- 61 12 95 % --  07/06/23 0830 (!) 146/63 -- -- (!) 56 20 100 % --  07/06/23 0821 -- -- -- -- -- -- 89.1 kg  07/06/23 0820 137/64 98.2 F (36.8 C) Oral (!) 59 14 98 % --  07/06/23 0727 (!) 154/70 98.5 F (36.9 C) -- 62 16 100 % --  07/06/23 0510 138/61 97.9 F (36.6 C) Oral 66 18 99 % --  07/05/23 2006 (!) 112/57 98.3 F (36.8 C) Oral 66 18 100 % --      PHYSICAL EXAM:  General: Awake  no distress,  Lungs: Clear to auscultation bilaterally. No Wheezing or Rhonchi. No rales. Heart: Regular rate and rhythm, no murmur, rub or gallop. Abdomen: did not examine- sitting in chair Neurologic: did not examine  Lab Results    Latest Ref Rng & Units  07/06/2023    3:47 AM 07/05/2023    5:58 AM 07/04/2023    7:58 AM  CBC  WBC 4.0 - 10.5 K/uL 7.1  6.3  6.4   Hemoglobin 13.0 - 17.0 g/dL 40.9  81.1  9.8   Hematocrit 39.0 - 52.0 % 30.0  29.4  28.6   Platelets 150 - 400 K/uL 211  198  189        Latest Ref Rng & Units 07/06/2023    3:47 AM 07/05/2023    5:58 AM 07/04/2023    4:55 AM  CMP  Glucose 70 - 99 mg/dL 88  95    BUN 8 - 23 mg/dL 32  22    Creatinine 9.14 - 1.24 mg/dL 7.82  9.56  2.13   Sodium 135 - 145 mmol/L 134  134    Potassium 3.5 - 5.1 mmol/L 3.4  3.7    Chloride 98 - 111 mmol/L 94  97    CO2 22 -  32 mmol/L 28  28    Calcium 8.9 - 10.3 mg/dL 8.6  8.7        Microbiology: BC- enterococcus Repeat NG Urine enterococcus Studies/Results: No results found.   Assessment/Plan:  Enterococcus fecalis bacteremia With complicated UTI- hematuria and cystitis Eventhough he is on  dialysis he still makes urine Renal US no hydronephrosis Bladder scan residue reported in the flow chart as 58 Apparently there was another reading of 230 Will treat with Iv for 2 weeks- need to follow up with urology as he has BPH Can do vancomycin during dialysis until 07/15/23 Will repeat blood culture a week later after completing antibiotics- will follow him as OP- appt will show up on the discharge summary   Dementia   ESRD on dialysis   H/o CVA  Chronic CHF   Anemia     DM - management as per primary team  Discussed with sister  ID will Sign off- call if needed

## 2023-07-06 NOTE — TOC Progression Note (Addendum)
Transition of Care [facility]) - Progression Note    Patient Details  Name: Randall [name] MRN: [id number] Date of Birth: November 02, 1961  Transition of Care [facility]) CM/SW Contact  Allena Katz, LCSW Phone Number: 07/06/2023, 1:32 PM  Clinical Narrative:   Referrals sent out for rehab. Sister Chyrl [place]atte prefers [facility]. Pt triggered level 2 passr. Documents uploaded to [facility].          Expected Discharge Plan and Services                                               Social Determinants of Health (SDOH) Interventions SDOH Screenings   Food Insecurity: No Food Insecurity (06/30/2023)  Housing: Low Risk  (06/30/2023)  Transportation Needs: No Transportation Needs (06/30/2023)  Utilities: Not At Risk (06/30/2023)  Financial Resource Strain: High Risk (01/26/2022)   Received from [facility], [facility]  Physical Activity: Insufficiently Active (08/18/2021)   Received from [facility], [facility]  Social Connections: Moderately Integrated (08/18/2021)   Received from [facility], [facility]  Stress: Stress Concern Present (01/26/2022)   Received from [facility], [facility]  Tobacco Use: Medium Risk (06/30/2023)  Health Literacy: Low Risk  (04/19/2023)   Received from [facility]    Readmission Risk Interventions     No data to display

## 2023-07-07 DIAGNOSIS — R6520 Severe sepsis without septic shock: Secondary | ICD-10-CM | POA: Diagnosis not present

## 2023-07-07 DIAGNOSIS — A419 Sepsis, unspecified organism: Secondary | ICD-10-CM | POA: Diagnosis not present

## 2023-07-07 LAB — CBC
HCT: 30.8 % — ABNORMAL LOW (ref 39.0–52.0)
Hemoglobin: 10.5 g/dL — ABNORMAL LOW (ref 13.0–17.0)
MCH: 32.1 pg (ref 26.0–34.0)
MCHC: 34.1 g/dL (ref 30.0–36.0)
MCV: 94.2 fL (ref 80.0–100.0)
Platelets: 222 10*3/uL (ref 150–400)
RBC: 3.27 MIL/uL — ABNORMAL LOW (ref 4.22–5.81)
RDW: 12.6 % (ref 11.5–15.5)
WBC: 7.5 10*3/uL (ref 4.0–10.5)
nRBC: 0 % (ref 0.0–0.2)

## 2023-07-07 LAB — BASIC METABOLIC PANEL
Anion gap: 12 (ref 5–15)
BUN: 24 mg/dL — ABNORMAL HIGH (ref 8–23)
CO2: 27 mmol/L (ref 22–32)
Calcium: 8.8 mg/dL — ABNORMAL LOW (ref 8.9–10.3)
Chloride: 94 mmol/L — ABNORMAL LOW (ref 98–111)
Creatinine, Ser: 4.28 mg/dL — ABNORMAL HIGH (ref 0.61–1.24)
GFR, Estimated: 15 mL/min — ABNORMAL LOW (ref >60)
Glucose, Bld: 98 mg/dL (ref 70–99)
Potassium: 3.6 mmol/L (ref 3.5–5.1)
Sodium: 133 mmol/L — ABNORMAL LOW (ref 135–145)

## 2023-07-07 LAB — MAGNESIUM: Magnesium: 2 mg/dL (ref 1.7–2.4)

## 2023-07-07 MED ORDER — QUETIAPINE FUMARATE 25 MG PO TABS
25.0000 mg | ORAL_TABLET | Freq: Every day | ORAL | Status: DC
  Administered 2023-07-07: 25 mg via ORAL
  Filled 2023-07-07: qty 1

## 2023-07-07 NOTE — Plan of Care (Signed)

## 2023-07-07 NOTE — Progress Notes (Signed)
  PROGRESS NOTE    Randall [name]  WFU:[id number] DOB: 12/31/1960 DOA: 06/30/2023 PCP: Gabriel Earing, MD  120A/120A-AA  LOS: 5 days   Brief hospital course:   Assessment & Plan: Randall [name] is a 62 y.o. male  with medical history significant of ESRD on HD MWF, chronic HFpEF, HTN, advanced dementia, BPH, stroke, brought in by caretaker for evaluation of confusion and possible UTI.    Apparently, he became more confused after he had his routine hemodialysis on 06/29/2023.  His sister noticed blood stain in his underwear when he was changing the underwear.Pt was febrile in the ED with temperature 103.4 F and tachypneic with respiratory rate of 22.  He was admitted to the hospital for severe sepsis secondary to acute UTI.    Severe Sepsis secondary to  acute UTI Enterococcus faecalis bacteremia --Lactic acid was 2.8 on admission.   --BC 1/2 Enterococcus faecalis --Urine culture is growing Enterococcus faecalis.   -- ID consultation --TTE without vegetation --Bladder scan 230 cc --pt did not allow I and out cath.  Subsequent scan were 76ml, 0 ml, 74ml  Plan: --cont IV vanc.  After discharge, will get IV vanc with dialysis, end date 07/15/23   ESRD on HD:  -iHD per nephrology   Chronic diastolic CHF: Compensated   Acute metabolic encephalopathy  Advanced dementia:  Hospital delirium --acute metabolic encephalopathy on presentation due to sepsis.   --became more confused and agitated this morning during dialysis likely developing hospital delirium. Plan: --sister will accompany pt as much as possible --reduce seroquel to 25 mg nightly --IV haldol PRN   BPH --cont flomax   Hypertension --hold home coreg due to hypotension    history of stroke  --cont ASA and statin    As baseline- per sister who lives with patient at home patient ambulates using walker. Sister transport  pt to dialysis back-and-forth.   DVT prophylaxis: Heparin SQ Code Status: DNR  Family  Communication: sister updated at bedside Level of care: Med-Surg Dispo:   The patient is from: home Anticipated d/c is to: home with [facility] (sister declined SNF rehab) Anticipated d/c date is: tomorrow   Subjective and Interval History:  Pt slept well last night and continued to be sleepy this morning, likely due to the seroquel given last night.   Objective: Vitals:   07/06/23 2002 07/07/23 0536 07/07/23 0803 07/07/23 1551  BP: 113/64 (!) 123/57 (!) 97/53 (!) 105/54  Pulse: 64 62 64 68  Resp: 18 20 20 18   Temp: 97.8 F (36.6 C) 97.9 F (36.6 C) 97.7 F (36.5 C) 98.2 F (36.8 C)  TempSrc: Oral   Oral  SpO2: 100% 100% 99% 98%  Weight:      Height:        Intake/Output Summary (Last 24 hours) at 07/07/2023 1934 Last data filed at 07/07/2023 1725 Gross per 24 hour  Intake 120 ml  Output 1400 ml  Net -1280 ml   Filed Weights   07/04/23 1230 07/06/23 0821 07/06/23 1246  Weight: 96 kg 89.1 kg 87.2 kg    Examination:   Constitutional: NAD, slelepy CV: No cyanosis.   RESP: normal respiratory effort, on RA SKIN: warm, dry   Data Reviewed: I have personally reviewed labs and imaging studies  Time spent: 35 minutes  Darlin Priestly, MD [facility] If 7PM-7AM, please contact night-coverage 07/07/2023, 7:34 PM

## 2023-07-07 NOTE — Progress Notes (Signed)
[facility]  ROUNDING NOTE   Subjective:   Randall [name]  is a 62 year old male with past medical conditions including hypertension, type 2 diabetes, Bell's palsy, CVA, CHF, hyperlipidemia, and end-stage renal disease on hemodialysis.  Patient presents to the emergency department complaining of weakness and confusion.  Patient has been admitted under observation Encephalopathy [G93.40] Acute cystitis with hematuria [N30.01] Altered mental status, unspecified altered mental status type [R41.82] Sepsis, due to unspecified organism, unspecified whether acute organ dysfunction present [facility]) [A41.9]  Patient is known to our practice and receives outpatient dialysis treatments at [facility] on a MWF schedule, supervised by Dr. Cherylann Ratel.    Patient seen earlier today Eating breakfast and sister at bedside Drowsy  Remains on room air   Objective:  Vital signs in last 24 hours:  Temp:  [97.7 F (36.5 C)-98.5 F (36.9 C)] 97.7 F (36.5 C) (08/31 0803) Pulse Rate:  [62-77] 64 (08/31 0803) Resp:  [16-20] 20 (08/31 0803) BP: (97-123)/(53-78) 97/53 (08/31 0803) SpO2:  [99 %-100 %] 99 % (08/31 0803)  Weight change:  Filed Weights   07/04/23 1230 07/06/23 0821 07/06/23 1246  Weight: 96 kg 89.1 kg 87.2 kg    Intake/Output: I/O last 3 completed shifts: In: 533.3 [P.O.:120; IV Piggyback:413.3] Out: 2000 [Other:2000]   Intake/Output this shift:  Total I/O In: -  Out: 800 [Urine:800]  Physical Exam: General: NAD  Head: Normocephalic, atraumatic. Moist oral mucosal membranes  Eyes: Anicteric  Lungs:  Clear to auscultation, normal effort  Heart: Regular rate and rhythm  Abdomen:  Soft, nontender  Extremities:  No peripheral edema.  Neurologic: Somnolent, moving all four extremities  Skin: No lesions  Access: Rt AVF    Basic Metabolic Panel: Recent Labs  Lab 07/01/23 0507 07/02/23 0717 07/04/23 0455 07/05/23 0558 07/06/23 0347 07/07/23 0433  NA 134*  136  --  134* 134* 133*  K 4.0 4.2  --  3.7 3.4* 3.6  CL 100 104  --  97* 94* 94*  CO2 22 22  --  28 28 27   GLUCOSE 88 82  --  95 88 98  BUN 29* 31*  --  22 32* 24*  CREATININE 4.05* 4.27* 3.68* 3.40* 4.36* 4.28*  CALCIUM 8.2* 8.5*  --  8.7* 8.6* 8.8*  MG  --   --   --  1.8 1.9 2.0  PHOS  --  3.2  --   --   --   --     Liver Function Tests: Recent Labs  Lab 07/02/23 0717  ALBUMIN 2.8*   No results for input(s): "LIPASE", "AMYLASE" in the last 168 hours. No results for input(s): "AMMONIA" in the last 168 hours.  CBC: Recent Labs  Lab 07/02/23 0717 07/04/23 0758 07/05/23 0558 07/06/23 0347 07/07/23 0433  WBC 7.4 6.4 6.3 7.1 7.5  HGB 9.4* 9.8* 10.1* 10.1* 10.5*  HCT 27.6* 28.6* 29.4* 30.0* 30.8*  MCV 94.8 93.2 93.0 94.9 94.2  PLT 161 189 198 211 222    Cardiac Enzymes: No results for input(s): "CKTOTAL", "CKMB", "CKMBINDEX", "TROPONINI" in the last 168 hours.  BNP: Invalid input(s): "POCBNP"  CBG: Recent Labs  Lab 06/30/23 1629 07/02/23 2228 07/03/23 1538  GLUCAP 144* 145* 120*    Microbiology: Results for orders placed or performed during the hospital encounter of 06/30/23  Resp panel by RT-PCR (RSV, Flu A&B, Covid) Anterior Nasal Swab     Status: None   Collection Time: 06/30/23 10:08 AM   Specimen: Anterior Nasal Swab  Result Value Ref Range Status   SARS Coronavirus 2 by RT PCR NEGATIVE NEGATIVE Final    Comment: (NOTE) SARS-CoV-2 target nucleic acids are NOT DETECTED.  The SARS-CoV-2 RNA is generally detectable in upper respiratory specimens during the acute phase of infection. The lowest concentration of SARS-CoV-2 viral copies this assay can detect is 138 copies/mL. A negative result does not preclude SARS-Cov-2 infection and should not be used as the sole basis for treatment or other patient management decisions. A negative result may occur with  improper specimen collection/handling, submission of specimen other than nasopharyngeal swab,  presence of viral mutation(s) within the areas targeted by this assay, and inadequate number of viral copies(<138 copies/mL). A negative result must be combined with clinical observations, patient history, and epidemiological information. The expected result is Negative.  Fact Sheet for Patients:  BloggerCourse.com  Fact Sheet for Healthcare Providers:  SeriousBroker.it  This test is no t yet approved or cleared by the [place] FDA and  has been authorized for detection and/or diagnosis of SARS-CoV-2 by FDA under an Emergency Use Authorization (EUA). This EUA will remain  in effect (meaning this test can be used) for the duration of the COVID-19 declaration under Section 564(b)(1) of the Act, 21 U.S.C.section 360bbb-3(b)(1), unless the authorization is terminated  or revoked sooner.       Influenza A by PCR NEGATIVE NEGATIVE Final   Influenza B by PCR NEGATIVE NEGATIVE Final    Comment: (NOTE) The Xpert Xpress SARS-CoV-2/FLU/RSV plus assay is intended as an aid in the diagnosis of influenza from Nasopharyngeal swab specimens and should not be used as a sole basis for treatment. Nasal washings and aspirates are unacceptable for Xpert Xpress SARS-CoV-2/FLU/RSV testing.  Fact Sheet for Patients: BloggerCourse.com  Fact Sheet for Healthcare Providers: SeriousBroker.it  This test is not yet approved or cleared by the [place] FDA and has been authorized for detection and/or diagnosis of SARS-CoV-2 by FDA under an Emergency Use Authorization (EUA). This EUA will remain in effect (meaning this test can be used) for the duration of the COVID-19 declaration under Section 564(b)(1) of the Act, 21 U.S.C. section 360bbb-3(b)(1), unless the authorization is terminated or revoked.     Resp Syncytial Virus by PCR NEGATIVE NEGATIVE Final    Comment: (NOTE) Fact Sheet for  Patients: BloggerCourse.com  Fact Sheet for Healthcare Providers: SeriousBroker.it  This test is not yet approved or cleared by the [place] FDA and has been authorized for detection and/or diagnosis of SARS-CoV-2 by FDA under an Emergency Use Authorization (EUA). This EUA will remain in effect (meaning this test can be used) for the duration of the COVID-19 declaration under Section 564(b)(1) of the Act, 21 U.S.C. section 360bbb-3(b)(1), unless the authorization is terminated or revoked.  Performed at [facility], [address] [place], [place] [address]   Blood Culture (routine x 2)     Status: Abnormal   Collection Time: 06/30/23 10:27 AM   Specimen: BLOOD  Result Value Ref Range Status   Specimen Description   Final    BLOOD BLOOD LEFT ARM Performed at [facility], [address], [place], [place] [address]    Special Requests   Final    BOTTLES DRAWN AEROBIC AND ANAEROBIC Blood Culture adequate volume Performed at [facility], [address] [place], [place] [address]    Culture  Setup Time   Final    GRAM POSITIVE COCCI AEROBIC BOTTLE ONLY Organism ID to follow CRITICAL RESULT CALLED  TO, READ BACK BY AND VERIFIED WITH: JASON ROBBINS @ 0604 07/01/23 LFD GRAM STAIN REVIEWED-AGREE WITH RESULT Performed at [facility], [address] [place], [place] [address]    Culture ENTEROCOCCUS FAECALIS (A)  Final   Report Status 07/04/2023 FINAL  Final   Organism ID, Bacteria ENTEROCOCCUS FAECALIS  Final      Susceptibility   Enterococcus faecalis - MIC*    AMPICILLIN <=2 SENSITIVE Sensitive     VANCOMYCIN 1 SENSITIVE Sensitive     GENTAMICIN SYNERGY SENSITIVE Sensitive     * ENTEROCOCCUS FAECALIS  Blood Culture (routine x 2)     Status: None   Collection Time: 06/30/23 10:27 AM   Specimen: BLOOD  Result Value Ref Range Status   Specimen Description BLOOD LEFT ANTECUBITAL   Final   Special Requests   Final    BOTTLES DRAWN AEROBIC AND ANAEROBIC Blood Culture adequate volume   Culture   Final    NO GROWTH 5 DAYS Performed at [facility], [address] [place], [place] [address]    Report Status 07/05/2023 FINAL  Final  Urine Culture     Status: Abnormal   Collection Time: 06/30/23 10:27 AM   Specimen: Urine, Random  Result Value Ref Range Status   Specimen Description   Final    URINE, RANDOM Performed at [facility], [address] [place], [place] [address]    Special Requests   Final    NONE Reflexed from [id number] Performed at [facility], [address] [place], [place] [address]    Culture >=100,000 COLONIES/mL ENTEROCOCCUS FAECALIS (A)  Final   Report Status 07/02/2023 FINAL  Final   Organism ID, Bacteria ENTEROCOCCUS FAECALIS (A)  Final      Susceptibility   Enterococcus faecalis - MIC*    AMPICILLIN <=2 SENSITIVE Sensitive     NITROFURANTOIN <=16 SENSITIVE Sensitive     VANCOMYCIN <=0.5 SENSITIVE Sensitive     * >=100,000 COLONIES/mL ENTEROCOCCUS FAECALIS  Blood Culture ID Panel (Reflexed)     Status: Abnormal   Collection Time: 06/30/23 10:27 AM  Result Value Ref Range Status   Enterococcus faecalis DETECTED (A) NOT DETECTED Final    Comment: CRITICAL RESULT CALLED TO, READ BACK BY AND VERIFIED WITH: JASON ROBBINS @ 0604 07/01/23 LFD    Enterococcus Faecium NOT DETECTED NOT DETECTED Final   Listeria monocytogenes NOT DETECTED NOT DETECTED Final   Staphylococcus species NOT DETECTED NOT DETECTED Final   Staphylococcus aureus (BCID) NOT DETECTED NOT DETECTED Final   Staphylococcus epidermidis NOT DETECTED NOT DETECTED Final   Staphylococcus lugdunensis NOT DETECTED NOT DETECTED Final   Streptococcus species NOT DETECTED NOT DETECTED Final   Streptococcus agalactiae NOT DETECTED NOT DETECTED Final   Streptococcus pneumoniae NOT DETECTED NOT DETECTED Final   Streptococcus pyogenes NOT DETECTED NOT  DETECTED Final   A.calcoaceticus-baumannii NOT DETECTED NOT DETECTED Final   Bacteroides fragilis NOT DETECTED NOT DETECTED Final   Enterobacterales NOT DETECTED NOT DETECTED Final   Enterobacter cloacae complex NOT DETECTED NOT DETECTED Final   Escherichia coli NOT DETECTED NOT DETECTED Final   Klebsiella aerogenes NOT DETECTED NOT DETECTED Final   Klebsiella oxytoca NOT DETECTED NOT DETECTED Final   Klebsiella pneumoniae NOT DETECTED NOT DETECTED Final   Proteus species NOT DETECTED NOT DETECTED Final   Salmonella species NOT DETECTED NOT DETECTED Final   Serratia marcescens NOT DETECTED NOT DETECTED Final   Haemophilus influenzae NOT DETECTED NOT DETECTED Final   Neisseria meningitidis  NOT DETECTED NOT DETECTED Final   Pseudomonas aeruginosa NOT DETECTED NOT DETECTED Final   Stenotrophomonas maltophilia NOT DETECTED NOT DETECTED Final   Candida albicans NOT DETECTED NOT DETECTED Final   Candida auris NOT DETECTED NOT DETECTED Final   Candida glabrata NOT DETECTED NOT DETECTED Final   Candida krusei NOT DETECTED NOT DETECTED Final   Candida parapsilosis NOT DETECTED NOT DETECTED Final   Candida tropicalis NOT DETECTED NOT DETECTED Final   Cryptococcus neoformans/gattii NOT DETECTED NOT DETECTED Final   Vancomycin resistance NOT DETECTED NOT DETECTED Final    Comment: Performed at [facility], [address] [place], [place] [address]  Culture, blood (Routine X 2) w Reflex to ID Panel     Status: None (Preliminary result)   Collection Time: 07/03/23  8:31 AM   Specimen: BLOOD  Result Value Ref Range Status   Specimen Description BLOOD BLOOD LEFT HAND  Final   Special Requests   Final    BOTTLES DRAWN AEROBIC AND ANAEROBIC Blood Culture adequate volume   Culture   Final    NO GROWTH 4 DAYS Performed at [facility], [address] [place], [place] [address]    Report Status PENDING  Incomplete  Culture, blood (Routine X 2) w Reflex to ID Panel     Status:  None (Preliminary result)   Collection Time: 07/03/23  8:31 AM   Specimen: BLOOD  Result Value Ref Range Status   Specimen Description BLOOD LEFT [facility]  Final   Special Requests   Final    BOTTLES DRAWN AEROBIC AND ANAEROBIC Blood Culture adequate volume   Culture   Final    NO GROWTH 4 DAYS Performed at [facility], [address] [place], [place] [address]    Report Status PENDING  Incomplete    Coagulation Studies: No results for input(s): "LABPROT", "INR" in the last 72 hours.   Urinalysis: No results for input(s): "COLORURINE", "LABSPEC", "PHURINE", "GLUCOSEU", "HGBUR", "BILIRUBINUR", "KETONESUR", "PROTEINUR", "UROBILINOGEN", "NITRITE", "LEUKOCYTESUR" in the last 72 hours.  Invalid input(s): "APPERANCEUR"     Imaging: No results found.   Medications:    vancomycin Stopped (07/06/23 1306)     aspirin EC  81 mg Oral Daily   carvedilol  25 mg Oral BID WC   heparin  5,000 Units Subcutaneous Q12H   pantoprazole  20 mg Oral Daily   QUEtiapine  25 mg Oral QHS   rosuvastatin  10 mg Oral QPM   sertraline  100 mg Oral Daily   tamsulosin  0.4 mg Oral QHS   haloperidol lactate, ondansetron **OR** ondansetron (ZOFRAN) IV  Assessment/ Plan:  Mr. Randall [name] is a 62 y.o.  male past medical conditions including hypertension, type 2 diabetes, Bell's palsy, CVA, CHF, hyperlipidemia, and end-stage renal disease on hemodialysis.  Patient presents to the emergency department complaining of weakness and confusion.  Patient has been admitted under observation Encephalopathy [G93.40] Acute cystitis with hematuria [N30.01] Altered mental status, unspecified altered mental status type [R41.82] Sepsis, due to unspecified organism, unspecified whether acute organ dysfunction present (HCC) [A41.9]   1. End stage renal disease on hemodialysis.  Next treatment scheduled for Monday. Will require Vancomycin 1g with dialysis until 07/15/23. Outpatient clinic has been  notified.  2. Sepsis secondary due to UTI. Urine and blood culture growing Enterococcus faecalis. IV vancomycin per ID recs.  3. Anemia of chronic kidney disease Lab Results  Component Value Date   HGB 10.5 (L) 07/07/2023    Patient receives  Mircera at outpatient clinic. Hemoglobin within optimal range. No need for ESA's at this time.   4. Hypertension with chronic kidney disease. Home regimen includes carvedilol. Also on tamsulosin. Receiving these medications.   Blood pressure 97/53 this morning.   5. Secondary Hyperparathyroidism: with outpatient labs: none available  Lab Results  Component Value Date   CALCIUM 8.8 (L) 07/07/2023   CAION 1.19 06/15/2022   PHOS 3.2 07/02/2023    Patient receives cholecalciferol and sevelamer outpatient. Will continue to monitor bone minerals during this admission   LOS: 5   8/31/20242:33 PM

## 2023-07-07 NOTE — TOC Progression Note (Addendum)
Transition of Care [facility]) - Progression Note    Patient Details  Name: Randall [name] MRN: [id number] Date of Birth: [birth year]  Transition of Care [facility]) CM/SW Contact  Allena Katz, LCSW Phone Number: 07/07/2023, 9:41 AM  Clinical Narrative:   CSW spoke with [facility] about bed offers. She states she really wanted [facility] at [facility] and since they are unable to take she may take him home. Joann going to [facility] at facilities today such [facility] to see if she would like to go with these.     3:00pm CSW spoke with sister who reports after looking at facilities she really would like to take him home. Per NP and MD dialysis center is able to take pt on Monday and is aware he is needing antibiotics. Sister would like to use same home health as they used before. Message sent to [facility] with [facility] to see if he can take for PT/OT/RN  Expected Discharge Plan and Services                                               Social Determinants of Health (SDOH) Interventions SDOH Screenings   Food Insecurity: No Food Insecurity (06/30/2023)  Housing: Low Risk  (06/30/2023)  Transportation Needs: No Transportation Needs (06/30/2023)  Utilities: Not At Risk (06/30/2023)  Financial Resource Strain: High Risk (01/26/2022)   Received from [facility], [facility]  Physical Activity: Insufficiently Active (08/18/2021)   Received from [facility], [facility]  Social Connections: Moderately Integrated (08/18/2021)   Received from [facility], [facility]  Stress: Stress Concern Present (01/26/2022)   Received from [facility], [facility]  Tobacco Use: Medium Risk (06/30/2023)  Health Literacy: Low Risk  (04/19/2023)   Received from [facility]    Readmission Risk Interventions     No data to display

## 2023-07-08 DIAGNOSIS — R6520 Severe sepsis without septic shock: Secondary | ICD-10-CM | POA: Diagnosis not present

## 2023-07-08 DIAGNOSIS — A419 Sepsis, unspecified organism: Secondary | ICD-10-CM | POA: Diagnosis not present

## 2023-07-08 LAB — BASIC METABOLIC PANEL
Anion gap: 11 (ref 5–15)
BUN: 34 mg/dL — ABNORMAL HIGH (ref 8–23)
CO2: 28 mmol/L (ref 22–32)
Calcium: 8.5 mg/dL — ABNORMAL LOW (ref 8.9–10.3)
Chloride: 94 mmol/L — ABNORMAL LOW (ref 98–111)
Creatinine, Ser: 5.49 mg/dL — ABNORMAL HIGH (ref 0.61–1.24)
GFR, Estimated: 11 mL/min — ABNORMAL LOW (ref >60)
Glucose, Bld: 83 mg/dL (ref 70–99)
Potassium: 3.4 mmol/L — ABNORMAL LOW (ref 3.5–5.1)
Sodium: 133 mmol/L — ABNORMAL LOW (ref 135–145)

## 2023-07-08 LAB — CBC
HCT: 30.2 % — ABNORMAL LOW (ref 39.0–52.0)
Hemoglobin: 10.5 g/dL — ABNORMAL LOW (ref 13.0–17.0)
MCH: 32.1 pg (ref 26.0–34.0)
MCHC: 34.8 g/dL (ref 30.0–36.0)
MCV: 92.4 fL (ref 80.0–100.0)
Platelets: 241 10*3/uL (ref 150–400)
RBC: 3.27 MIL/uL — ABNORMAL LOW (ref 4.22–5.81)
RDW: 12.5 % (ref 11.5–15.5)
WBC: 8.6 10*3/uL (ref 4.0–10.5)
nRBC: 0 % (ref 0.0–0.2)

## 2023-07-08 LAB — CULTURE, BLOOD (ROUTINE X 2)
Culture: NO GROWTH
Culture: NO GROWTH
Special Requests: ADEQUATE
Special Requests: ADEQUATE

## 2023-07-08 LAB — MAGNESIUM: Magnesium: 2.2 mg/dL (ref 1.7–2.4)

## 2023-07-08 MED ORDER — LIDOCAINE-PRILOCAINE 2.5-2.5 % EX CREA: TOPICAL_CREAM | CUTANEOUS | Status: AC

## 2023-07-08 NOTE — Progress Notes (Signed)
[facility]  ROUNDING NOTE   Subjective:   Randall [name]  is a 62 year old male with past medical conditions including hypertension, type 2 diabetes, Bell's palsy, CVA, CHF, hyperlipidemia, and end-stage renal disease on hemodialysis.  Patient presents to the emergency department complaining of weakness and confusion.  Patient has been admitted under observation Encephalopathy [G93.40] Acute cystitis with hematuria [N30.01] Altered mental status, unspecified altered mental status type [R41.82] Sepsis, due to unspecified organism, unspecified whether acute organ dysfunction present [facility]) [A41.9]  Patient is known to our practice and receives outpatient dialysis treatments at [facility] on a MWF schedule, supervised by Dr. Cherylann Ratel.    Patient seen earlier today Eating breakfast and sister at bedside.  She reports that patient has good appetite. Remains on room air Potential discharge later today.   Objective:  Vital signs in last 24 hours:  Temp:  [97.8 F (36.6 C)-99.2 F (37.3 C)] 97.8 F (36.6 C) (09/01 0823) Pulse Rate:  [68-76] 73 (09/01 0823) Resp:  [18-20] 18 (09/01 0823) BP: (105-126)/(47-58) 126/47 (09/01 0823) SpO2:  [96 %-100 %] 96 % (09/01 0823)  Weight change:  Filed Weights   07/04/23 1230 07/06/23 0821 07/06/23 1246  Weight: 96 kg 89.1 kg 87.2 kg    Intake/Output: I/O last 3 completed shifts: In: 120 [P.O.:120] Out: 1400 [Urine:1400]   Intake/Output this shift:  Total I/O In: -  Out: 300 [Urine:300]  Physical Exam: General: NAD  Head: Normocephalic, atraumatic. Moist oral mucosal membranes  Eyes: Anicteric  Lungs:  Clear to auscultation, normal effort  Heart: Regular rate and rhythm  Abdomen:  Soft, nontender  Extremities: Trace to 1+ peripheral edema.  Neurologic: Alert, eating breakfast  Skin: No lesions  Access: Rt AVF    Basic Metabolic Panel: Recent Labs  Lab 07/02/23 0717 07/04/23 0455 07/05/23 0558  07/06/23 0347 07/07/23 0433 07/08/23 0538  NA 136  --  134* 134* 133* 133*  K 4.2  --  3.7 3.4* 3.6 3.4*  CL 104  --  97* 94* 94* 94*  CO2 22  --  28 28 27 28   GLUCOSE 82  --  95 88 98 83  BUN 31*  --  22 32* 24* 34*  CREATININE 4.27* 3.68* 3.40* 4.36* 4.28* 5.49*  CALCIUM 8.5*  --  8.7* 8.6* 8.8* 8.5*  MG  --   --  1.8 1.9 2.0 2.2  PHOS 3.2  --   --   --   --   --     Liver Function Tests: Recent Labs  Lab 07/02/23 0717  ALBUMIN 2.8*   No results for input(s): "LIPASE", "AMYLASE" in the last 168 hours. No results for input(s): "AMMONIA" in the last 168 hours.  CBC: Recent Labs  Lab 07/04/23 0758 07/05/23 0558 07/06/23 0347 07/07/23 0433 07/08/23 0538  WBC 6.4 6.3 7.1 7.5 8.6  HGB 9.8* 10.1* 10.1* 10.5* 10.5*  HCT 28.6* 29.4* 30.0* 30.8* 30.2*  MCV 93.2 93.0 94.9 94.2 92.4  PLT 189 198 211 222 241    Cardiac Enzymes: No results for input(s): "CKTOTAL", "CKMB", "CKMBINDEX", "TROPONINI" in the last 168 hours.  BNP: Invalid input(s): "POCBNP"  CBG: Recent Labs  Lab 07/02/23 2228 07/03/23 1538  GLUCAP 145* 120*    Microbiology: Results for orders placed or performed during the hospital encounter of 06/30/23  Resp panel by RT-PCR (RSV, Flu A&B, Covid) Anterior Nasal Swab     Status: None   Collection Time: 06/30/23 10:08 AM   Specimen: Anterior  Nasal Swab  Result Value Ref Range Status   SARS Coronavirus 2 by RT PCR NEGATIVE NEGATIVE Final    Comment: (NOTE) SARS-CoV-2 target nucleic acids are NOT DETECTED.  The SARS-CoV-2 RNA is generally detectable in upper respiratory specimens during the acute phase of infection. The lowest concentration of SARS-CoV-2 viral copies this assay can detect is 138 copies/mL. A negative result does not preclude SARS-Cov-2 infection and should not be used as the sole basis for treatment or other patient management decisions. A negative result may occur with  improper specimen collection/handling, submission of specimen  other than nasopharyngeal swab, presence of viral mutation(s) within the areas targeted by this assay, and inadequate number of viral copies(<138 copies/mL). A negative result must be combined with clinical observations, patient history, and epidemiological information. The expected result is Negative.  Fact Sheet for Patients:  BloggerCourse.com  Fact Sheet for Healthcare Providers:  SeriousBroker.it  This test is no t yet approved or cleared by the [place] FDA and  has been authorized for detection and/or diagnosis of SARS-CoV-2 by FDA under an Emergency Use Authorization (EUA). This EUA will remain  in effect (meaning this test can be used) for the duration of the COVID-19 declaration under Section 564(b)(1) of the Act, 21 U.S.C.section 360bbb-3(b)(1), unless the authorization is terminated  or revoked sooner.       Influenza A by PCR NEGATIVE NEGATIVE Final   Influenza B by PCR NEGATIVE NEGATIVE Final    Comment: (NOTE) The Xpert Xpress SARS-CoV-2/FLU/RSV plus assay is intended as an aid in the diagnosis of influenza from Nasopharyngeal swab specimens and should not be used as a sole basis for treatment. Nasal washings and aspirates are unacceptable for Xpert Xpress SARS-CoV-2/FLU/RSV testing.  Fact Sheet for Patients: BloggerCourse.com  Fact Sheet for Healthcare Providers: SeriousBroker.it  This test is not yet approved or cleared by the [place] FDA and has been authorized for detection and/or diagnosis of SARS-CoV-2 by FDA under an Emergency Use Authorization (EUA). This EUA will remain in effect (meaning this test can be used) for the duration of the COVID-19 declaration under Section 564(b)(1) of the Act, 21 U.S.C. section 360bbb-3(b)(1), unless the authorization is terminated or revoked.     Resp Syncytial Virus by PCR NEGATIVE NEGATIVE Final     Comment: (NOTE) Fact Sheet for Patients: BloggerCourse.com  Fact Sheet for Healthcare Providers: SeriousBroker.it  This test is not yet approved or cleared by the [place] FDA and has been authorized for detection and/or diagnosis of SARS-CoV-2 by FDA under an Emergency Use Authorization (EUA). This EUA will remain in effect (meaning this test can be used) for the duration of the COVID-19 declaration under Section 564(b)(1) of the Act, 21 U.S.C. section 360bbb-3(b)(1), unless the authorization is terminated or revoked.  Performed at [facility], [address] [place], [place] [address]   Blood Culture (routine x 2)     Status: Abnormal   Collection Time: 06/30/23 10:27 AM   Specimen: BLOOD  Result Value Ref Range Status   Specimen Description   Final    BLOOD BLOOD LEFT ARM Performed at [facility], [address], [place], [place] [address]    Special Requests   Final    BOTTLES DRAWN AEROBIC AND ANAEROBIC Blood Culture adequate volume Performed at [facility], [address], [place], [place] [address]    Culture  Setup Time   Final    GRAM POSITIVE COCCI AEROBIC BOTTLE ONLY Organism ID to follow  CRITICAL RESULT CALLED TO, READ BACK BY AND VERIFIED WITH: JASON ROBBINS @ 0604 07/01/23 LFD GRAM STAIN REVIEWED-AGREE WITH RESULT Performed at [facility], [address] [place], [place] [address]    Culture ENTEROCOCCUS FAECALIS (A)  Final   Report Status 07/04/2023 FINAL  Final   Organism ID, Bacteria ENTEROCOCCUS FAECALIS  Final      Susceptibility   Enterococcus faecalis - MIC*    AMPICILLIN <=2 SENSITIVE Sensitive     VANCOMYCIN 1 SENSITIVE Sensitive     GENTAMICIN SYNERGY SENSITIVE Sensitive     * ENTEROCOCCUS FAECALIS  Blood Culture (routine x 2)     Status: None   Collection Time: 06/30/23 10:27 AM   Specimen: BLOOD  Result Value Ref Range Status   Specimen  Description BLOOD LEFT ANTECUBITAL  Final   Special Requests   Final    BOTTLES DRAWN AEROBIC AND ANAEROBIC Blood Culture adequate volume   Culture   Final    NO GROWTH 5 DAYS Performed at [facility], [address] [place], [place] [address]    Report Status 07/05/2023 FINAL  Final  Urine Culture     Status: Abnormal   Collection Time: 06/30/23 10:27 AM   Specimen: Urine, Random  Result Value Ref Range Status   Specimen Description   Final    URINE, RANDOM Performed at [facility], [address] [place], [place] [address]    Special Requests   Final    NONE Reflexed from [id number] Performed at [facility], [address] [place], [place] [address]    Culture >=100,000 COLONIES/mL ENTEROCOCCUS FAECALIS (A)  Final   Report Status 07/02/2023 FINAL  Final   Organism ID, Bacteria ENTEROCOCCUS FAECALIS (A)  Final      Susceptibility   Enterococcus faecalis - MIC*    AMPICILLIN <=2 SENSITIVE Sensitive     NITROFURANTOIN <=16 SENSITIVE Sensitive     VANCOMYCIN <=0.5 SENSITIVE Sensitive     * >=100,000 COLONIES/mL ENTEROCOCCUS FAECALIS  Blood Culture ID Panel (Reflexed)     Status: Abnormal   Collection Time: 06/30/23 10:27 AM  Result Value Ref Range Status   Enterococcus faecalis DETECTED (A) NOT DETECTED Final    Comment: CRITICAL RESULT CALLED TO, READ BACK BY AND VERIFIED WITH: JASON ROBBINS @ 0604 07/01/23 LFD    Enterococcus Faecium NOT DETECTED NOT DETECTED Final   Listeria monocytogenes NOT DETECTED NOT DETECTED Final   Staphylococcus species NOT DETECTED NOT DETECTED Final   Staphylococcus aureus (BCID) NOT DETECTED NOT DETECTED Final   Staphylococcus epidermidis NOT DETECTED NOT DETECTED Final   Staphylococcus lugdunensis NOT DETECTED NOT DETECTED Final   Streptococcus species NOT DETECTED NOT DETECTED Final   Streptococcus agalactiae NOT DETECTED NOT DETECTED Final   Streptococcus pneumoniae NOT DETECTED NOT DETECTED Final    Streptococcus pyogenes NOT DETECTED NOT DETECTED Final   A.calcoaceticus-baumannii NOT DETECTED NOT DETECTED Final   Bacteroides fragilis NOT DETECTED NOT DETECTED Final   Enterobacterales NOT DETECTED NOT DETECTED Final   Enterobacter cloacae complex NOT DETECTED NOT DETECTED Final   Escherichia coli NOT DETECTED NOT DETECTED Final   Klebsiella aerogenes NOT DETECTED NOT DETECTED Final   Klebsiella oxytoca NOT DETECTED NOT DETECTED Final   Klebsiella pneumoniae NOT DETECTED NOT DETECTED Final   Proteus species NOT DETECTED NOT DETECTED Final   Salmonella species NOT DETECTED NOT DETECTED Final   Serratia marcescens NOT DETECTED NOT DETECTED Final   Haemophilus influenzae NOT DETECTED NOT DETECTED Final  Neisseria meningitidis NOT DETECTED NOT DETECTED Final   Pseudomonas aeruginosa NOT DETECTED NOT DETECTED Final   Stenotrophomonas maltophilia NOT DETECTED NOT DETECTED Final   Candida albicans NOT DETECTED NOT DETECTED Final   Candida auris NOT DETECTED NOT DETECTED Final   Candida glabrata NOT DETECTED NOT DETECTED Final   Candida krusei NOT DETECTED NOT DETECTED Final   Candida parapsilosis NOT DETECTED NOT DETECTED Final   Candida tropicalis NOT DETECTED NOT DETECTED Final   Cryptococcus neoformans/gattii NOT DETECTED NOT DETECTED Final   Vancomycin resistance NOT DETECTED NOT DETECTED Final    Comment: Performed at [facility], [address] [place], [place] [address]  Culture, blood (Routine X 2) w Reflex to ID Panel     Status: None   Collection Time: 07/03/23  8:31 AM   Specimen: BLOOD  Result Value Ref Range Status   Specimen Description BLOOD BLOOD LEFT HAND  Final   Special Requests   Final    BOTTLES DRAWN AEROBIC AND ANAEROBIC Blood Culture adequate volume   Culture   Final    NO GROWTH 5 DAYS Performed at [facility], [address] [place], [place] [address]    Report Status 07/08/2023 FINAL  Final  Culture, blood (Routine X 2) w Reflex to  ID Panel     Status: None   Collection Time: 07/03/23  8:31 AM   Specimen: BLOOD  Result Value Ref Range Status   Specimen Description BLOOD LEFT [facility]  Final   Special Requests   Final    BOTTLES DRAWN AEROBIC AND ANAEROBIC Blood Culture adequate volume   Culture   Final    NO GROWTH 5 DAYS Performed at [facility], [address] [place], [place] [address]    Report Status 07/08/2023 FINAL  Final    Coagulation Studies: No results for input(s): "LABPROT", "INR" in the last 72 hours.   Urinalysis: No results for input(s): "COLORURINE", "LABSPEC", "PHURINE", "GLUCOSEU", "HGBUR", "BILIRUBINUR", "KETONESUR", "PROTEINUR", "UROBILINOGEN", "NITRITE", "LEUKOCYTESUR" in the last 72 hours.  Invalid input(s): "APPERANCEUR"     Imaging: No results found.   Medications:    vancomycin Stopped (07/06/23 1306)     aspirin EC  81 mg Oral Daily   heparin  5,000 Units Subcutaneous Q12H   pantoprazole  20 mg Oral Daily   QUEtiapine  25 mg Oral QHS   rosuvastatin  10 mg Oral QPM   sertraline  100 mg Oral Daily   tamsulosin  0.4 mg Oral QHS   haloperidol lactate, ondansetron **OR** ondansetron (ZOFRAN) IV  Assessment/ Plan:  Randall [name] is a 62 y.o.  male past medical conditions including hypertension, type 2 diabetes, Bell's palsy, CVA, CHF, hyperlipidemia, and end-stage renal disease on hemodialysis.  Patient presents to the emergency department complaining of weakness and confusion.  Patient has been admitted under observation Encephalopathy [G93.40] Acute cystitis with hematuria [N30.01] Altered mental status, unspecified altered mental status type [R41.82] Sepsis, due to unspecified organism, unspecified whether acute organ dysfunction present (HCC) [A41.9]   1. End stage renal disease on hemodialysis.  Next treatment scheduled for Monday. Will require Vancomycin 1g with dialysis until 07/15/23. Outpatient clinic has been notified by our team.  2. Sepsis  secondary due to UTI. Urine and blood culture from 06/30/2023 growing Enterococcus faecalis. IV vancomycin per ID recs.  Patient is listed as allergic to penicillin.  3. Anemia of chronic kidney disease Lab Results  Component Value Date   HGB 10.5 (L) 07/08/2023  Patient receives Mircera at outpatient clinic. Hemoglobin within optimal range. No need for ESA's at this time.   4. Hypertension with chronic kidney disease. Home regimen includes carvedilol. Also on tamsulosin.    Blood pressure control is acceptable  5. Secondary Hyperparathyroidism: with outpatient labs: none available  Lab Results  Component Value Date   CALCIUM 8.5 (L) 07/08/2023   CAION 1.19 06/15/2022   PHOS 3.2 07/02/2023    Patient receives cholecalciferol and sevelamer outpatient. Will continue to monitor bone minerals during this admission   LOS: 6 Rexine Gowens 9/1/202410:42 AM

## 2023-07-08 NOTE — Progress Notes (Signed)
Physical Therapy Treatment Patient Details Name: Randall [name] MRN: [id number] DOB: 11/21/1960 Today's Date: 07/08/2023   History of Present Illness Randall [name] is a 61yoM who [facility] on [facility]/30/23 c AMS, emesis, hypoxia. Pt febrile in ED 103.4. PMH: dementia, CHF, ESRD on HD MWF, DM, CVA. Pt being treated for positive blood cultures, followed by ID.    PT Comments  Patient is in bed upon PT arrival with sister present. Nursing requested assistance for rolling/positioning in bed for medication delivery.  PT assist with bed mobility requiring min A and max simple cues for patient to roll R and L with heavy reliance upon railings. He is given his medication and then agreeable to sit EOB with assistance. He has extensive thoracic kyphosis and limited ability to perform upright posture. He transferred to standing with RW and extensive cueing for safety. He is able to follow simple commands only. Intermittent ability to answer questions. In standing he tolerates 28 seconds maximum of four trials. Attempt at marching failed with patient losing balance requiring PT to assist to remain upright. Patient not safe for ambulation at this time due to inability to lift feet at EOB. Sister educated on local SNFs as she is requesting home however patient is very limited in his mobility at this time and a high fall risk. Current POC remains appropriate at this time.    If plan is discharge home, recommend the following: Two people to help with walking and/or transfers;Help with stairs or ramp for entrance;Assist for transportation;A lot of help with bathing/dressing/bathroom;Assistance with cooking/housework   Can travel by private vehicle     No  Equipment Recommendations  Other (comment) (none if SNF)    Recommendations for Other Services       Precautions / Restrictions Precautions Precautions: Fall Restrictions Weight Bearing Restrictions: No     Mobility  Bed Mobility Overal bed  mobility: Needs Assistance Bed Mobility: Supine to Sit, Sit to Supine, Rolling Rolling: Min assist, Used rails   Supine to sit: Contact guard Sit to supine: Contact guard assist   General bed mobility comments: PT assisted patient in repositioning in bed, he is unable to think through sequencing requiring guidance with simple cues.    Transfers Overall transfer level: Needs assistance Equipment used: Rolling walker (2 wheels) Transfers: Sit to/from Stand Sit to Stand: Min assist           General transfer comment: requires max cueing for sequencing and safety, min A for assistance    Ambulation/Gait               General Gait Details: unable to perform   Stairs             Wheelchair Mobility     Tilt Bed    Modified Rankin (Stroke Patients Only)       Balance Overall balance assessment: Needs assistance Sitting-balance support: Feet supported Sitting balance-Leahy Scale: Fair Sitting balance - Comments: able to sit with significant thoracic kyphosis Postural control: Posterior lean Standing balance support: During functional activity, Reliant on assistive device for balance, Bilateral upper extremity supported Standing balance-Leahy Scale: Poor Standing balance comment: heavily reliant upon RW. poor posture with heavy kyphosis.                            Cognition Arousal: Alert Behavior During Therapy: Flat affect Overall Cognitive Status: History of cognitive impairments - at baseline  General Comments: sister present, reports his delayed response is normal, follows simple commands not complex rooms.        Exercises Other Exercises Other Exercises: Patient rolled R and L with nurse with Min A. SIster educated on roll of PT in acute care setting, safe bed mobility and transfers.    General Comments General comments (skin integrity, edema, etc.): Unable to march in place without LOB  requiring PT to assist in remaining upright position.      Pertinent Vitals/Pain Pain Assessment Pain Assessment: No/denies pain    Home Living                          Prior Function            PT Goals (current goals can now be found in the care plan section) Acute Rehab PT Goals Patient Stated Goal: regain strength to allow her to provide care in the home PT Goal Formulation: With family Time For Goal Achievement: 07/19/23 Potential to Achieve Goals: Good Progress towards PT goals: Progressing toward goals    Frequency    Min 1X/week      PT Plan      Co-evaluation              AM-PAC PT "6 Clicks" Mobility   Outcome Measure  Help needed turning from your back to your side while in a flat bed without using bedrails?: A Lot Help needed moving from lying on your back to sitting on the side of a flat bed without using bedrails?: A Lot Help needed moving to and from a bed to a chair (including a wheelchair)?: A Lot Help needed standing up from a chair using your arms (e.g., wheelchair or bedside chair)?: A Little Help needed to walk in hospital room?: A Lot Help needed climbing 3-5 steps with a railing? : A Lot 6 Click Score: 13    End of Session Equipment Utilized During Treatment: Gait belt Activity Tolerance: Patient limited by fatigue Patient left: with call bell/phone within reach;in bed;with family/visitor present;with bed alarm set Nurse Communication: Mobility status PT Visit Diagnosis: Unsteadiness on feet (R26.81);Difficulty in walking, not elsewhere classified (R26.2);Muscle weakness (generalized) (M62.81)     Time: 1610-9604 PT Time Calculation (min) (ACUTE ONLY): 24 min  Charges:    $Therapeutic Activity: 23-37 mins PT General Charges $$ ACUTE PT VISIT: 1 Visit                     Precious Bard, PT, DPT Physical Therapist - [facility]    07/08/2023, 11:44 AM

## 2023-07-08 NOTE — Plan of Care (Signed)
  Problem: Education: Goal: Knowledge of General Education information will improve Description: Including pain rating scale, medication(s)/side effects and non-pharmacologic comfort measures 07/08/2023 1153 by Harrietta Guardian, RN Outcome: Adequate for Discharge 07/08/2023 1153 by Harrietta Guardian, RN Outcome: Progressing   Problem: Health Behavior/Discharge Planning: Goal: Ability to manage health-related needs will improve 07/08/2023 1153 by Harrietta Guardian, RN Outcome: Adequate for Discharge 07/08/2023 1153 by Harrietta Guardian, RN Outcome: Progressing   Problem: Clinical Measurements: Goal: Ability to maintain clinical measurements within normal limits will improve 07/08/2023 1153 by Harrietta Guardian, RN Outcome: Adequate for Discharge 07/08/2023 1153 by Harrietta Guardian, RN Outcome: Progressing Goal: Will remain free from infection 07/08/2023 1153 by Harrietta Guardian, RN Outcome: Adequate for Discharge 07/08/2023 1153 by Harrietta Guardian, RN Outcome: Progressing Goal: Diagnostic test results will improve 07/08/2023 1153 by Harrietta Guardian, RN Outcome: Adequate for Discharge 07/08/2023 1153 by Harrietta Guardian, RN Outcome: Progressing Goal: Respiratory complications will improve 07/08/2023 1153 by Harrietta Guardian, RN Outcome: Adequate for Discharge 07/08/2023 1153 by Harrietta Guardian, RN Outcome: Progressing Goal: Cardiovascular complication will be avoided 07/08/2023 1153 by Harrietta Guardian, RN Outcome: Adequate for Discharge 07/08/2023 1153 by Harrietta Guardian, RN Outcome: Progressing   Problem: Activity: Goal: Risk for activity intolerance will decrease 07/08/2023 1153 by Harrietta Guardian, RN Outcome: Adequate for Discharge 07/08/2023 1153 by Harrietta Guardian, RN Outcome: Progressing   Problem: Nutrition: Goal: Adequate nutrition will be maintained 07/08/2023 1153 by Harrietta Guardian, RN Outcome: Adequate for Discharge 07/08/2023 1153 by Harrietta Guardian, RN Outcome: Progressing   Problem:  Coping: Goal: Level of anxiety will decrease 07/08/2023 1153 by Harrietta Guardian, RN Outcome: Adequate for Discharge 07/08/2023 1153 by Harrietta Guardian, RN Outcome: Progressing   Problem: Elimination: Goal: Will not experience complications related to bowel motility 07/08/2023 1153 by Harrietta Guardian, RN Outcome: Adequate for Discharge 07/08/2023 1153 by Harrietta Guardian, RN Outcome: Progressing Goal: Will not experience complications related to urinary retention 07/08/2023 1153 by Harrietta Guardian, RN Outcome: Adequate for Discharge 07/08/2023 1153 by Harrietta Guardian, RN Outcome: Progressing   Problem: Pain Managment: Goal: General experience of comfort will improve 07/08/2023 1153 by Harrietta Guardian, RN Outcome: Adequate for Discharge 07/08/2023 1153 by Harrietta Guardian, RN Outcome: Progressing   Problem: Safety: Goal: Ability to remain free from injury will improve 07/08/2023 1153 by Harrietta Guardian, RN Outcome: Adequate for Discharge 07/08/2023 1153 by Harrietta Guardian, RN Outcome: Progressing   Problem: Skin Integrity: Goal: Risk for impaired skin integrity will decrease 07/08/2023 1153 by Harrietta Guardian, RN Outcome: Adequate for Discharge 07/08/2023 1153 by Harrietta Guardian, RN Outcome: Progressing

## 2023-07-08 NOTE — TOC Transition Note (Addendum)
Transition of Care [facility]) - CM/SW Discharge Note   Patient Details  Name: Randall [name] MRN: [id number] Date of Birth: November 21, 1960  [facility] CM/SW Contact:  Kemper Durie, RN Phone Number: 07/08/2023, 12:47 PM   Clinical Narrative:     Spoke with sister Chyrl Civatte regarding discharge orders.  She confirms patient will not go to SNF and will go home with home health.  She will be able to get home prior to patient as he will go with EMS.  Facesheet and Medical Necessity forms completed and printed, address confirmed with sister, bedside nurse aware to include DNR form in packet.  Will call EMS when patient is ready for transport home.   Update @ 1425 - EMS called for transport home. Patient is next on the list, bedside nurse aware.  Final next level of care: Home w Home Health Services Barriers to Discharge: Barriers Resolved   Patient Goals and CMS Choice      Discharge Placement                  Patient to be transferred to facility by: Home by nonemergent [facility] Name of family member notified: Zaid Maison Patient and family notified of of transfer: 07/08/23  Discharge Plan and Services Additional resources added to the After Visit Summary for                            [facility] Arranged: PT, OT, RN [facility] Agency: [facility] Date [facility] Agency Contacted: 07/08/23 Time HH Agency Contacted: [phone] Representative spoke with at [facility] Agency: Kandee Keen  Social Determinants of Health (SDOH) Interventions SDOH Screenings   Food Insecurity: No Food Insecurity (06/30/2023)  Housing: Low Risk  (06/30/2023)  Transportation Needs: No Transportation Needs (06/30/2023)  Utilities: Not At Risk (06/30/2023)  Financial Resource Strain: High Risk (01/26/2022)   Received from [facility], [facility]  Physical Activity: Insufficiently Active (08/18/2021)   Received from [facility], [facility]  Social Connections: Moderately Integrated (08/18/2021)   Received  from [facility], [facility]  Stress: Stress Concern Present (01/26/2022)   Received from [facility], [facility]  Tobacco Use: Medium Risk (06/30/2023)  Health Literacy: Low Risk  (04/19/2023)   Received from [facility]     Readmission Risk Interventions     No data to display

## 2023-07-08 NOTE — Discharge Summary (Signed)
Physician Discharge Summary   Randall [name]  male DOB: 1960-12-23  UJW:[id number]  PCP: Gabriel Earing, MD  Admit date: 06/30/2023 Discharge date: 07/08/2023  Admitted From: home Disposition:  home.  Sister declined SNF rehab.  Sister updated at bedside prior to discharge. Home Health: Yes CODE STATUS: DNR   Hospital Course:  For full details, please see H&P, progress notes, consult notes and ancillary notes.  Briefly,  Randall [name] is a 62 y.o. male  with medical history significant of ESRD on HD MWF, chronic HFpEF, HTN, advanced dementia, BPH, stroke, brought in by caretaker for evaluation of confusion and possible UTI.    Apparently, he became more confused after he had his routine hemodialysis on 06/29/2023.  His sister noticed blood stain in his underwear.  Pt was febrile in the ED with temperature 103.4 F and tachypneic with respiratory rate of 22.  He was admitted to the hospital for severe sepsis secondary to acute UTI.    Severe Sepsis secondary to  acute UTI Enterococcus faecalis bacteremia --Lactic acid was 2.8 on admission.   --BC 1/2 Enterococcus faecalis --Urine culture is growing Enterococcus faecalis.   -- ID consultation --TTE without vegetation --cont IV vanc.  After discharge, will get IV vanc with dialysis, end date 07/15/23    ESRD on HD:  -iHD per nephrology   Chronic diastolic CHF: Compensated   Acute metabolic encephalopathy  Advanced dementia:  Hospital delirium --acute metabolic encephalopathy on presentation due to sepsis.   --became more confused and agitated morning of 8/30 during dialysis likely developing hospital delirium. --agitation improved with sister being present and night-time seroquel.    BPH --cont flomax   Hypertension, not currently active --hold home coreg d/c'ed due to hypotension    history of stroke  --cont ASA and statin   Weakness and deconditioning per sister who lives with Randall at home, at baseline  Randall ambulates using walker.  Sister transport  pt to dialysis back-and-forth. --PT eval rec SNF rehab.  Pt was not accepted to the SNF sister wanted for him, so sister decided to take pt home with [facility].   Unless noted above, medications under "STOP" list are ones pt was not taking PTA.  Discharge Diagnoses:  Principal Problem:   Severe sepsis (HCC) Active Problems:   Acute encephalopathy   UTI (urinary tract infection)   ESRD on dialysis (HCC)   Encephalopathy   Bacteremia due to Enterococcus   30 Day Unplanned Readmission Risk Score    Flowsheet Row ED to Hosp-Admission (Current) from 06/30/2023 in [facility] 1C MEDICAL TELEMETRY  30 Day Unplanned Readmission Risk Score (%) 23.83 Filed at 07/08/2023 0801       This score is the Randall's risk of an unplanned readmission within 30 days of being discharged (0 -100%). The score is based on dignosis, age, lab data, medications, orders, and past utilization.   Low:  0-14.9   Medium: 15-21.9   High: 22-29.9   Extreme: 30 and above         Discharge Instructions:  Allergies as of 07/08/2023       Reactions   Penicillins    Reaction in childhood - unknown reaction Randall states he has tolerated amoxicillin.          Medication List     STOP taking these medications    carvedilol 25 MG tablet Commonly known as: COREG   cyanocobalamin 1000 MCG tablet   sevelamer carbonate 800 MG tablet Commonly  known as: RENVELA   thiamine 100 MG tablet Commonly known as: VITAMIN B1       TAKE these medications    aspirin EC 81 MG tablet Take 81 mg by mouth daily.   cholecalciferol 25 MCG (1000 UNIT) tablet Commonly known as: VITAMIN D3 Take 1,000 Units by mouth daily.   lansoprazole 30 MG capsule Commonly known as: PREVACID Take 30 mg by mouth daily.   lidocaine-prilocaine cream Commonly known as: EMLA Use 3 times weekly before dialysis. What changed:  how much to take how to take  this when to take this additional instructions   rosuvastatin 10 MG tablet Commonly known as: CRESTOR Take 10 mg by mouth daily.   sertraline 100 MG tablet Commonly known as: ZOLOFT Take 100 mg by mouth daily.   tamsulosin 0.4 MG Caps capsule Commonly known as: FLOMAX Take 0.4 mg by mouth at bedtime.         Follow-up Information     Gabriel Earing, MD Follow up in 1 week(s).   Specialty: Internal Medicine Contact information: [address] [place] [address] [phone]                 Allergies  Allergen Reactions   Penicillins     Reaction in childhood - unknown reaction Randall states he has tolerated amoxicillin.        The results of significant diagnostics from this hospitalization (including imaging, microbiology, ancillary and laboratory) are listed below for reference.   Consultations:   Procedures/Studies: US RENAL  Result Date: 07/03/2023 CLINICAL DATA:  Hematuria EXAM: RENAL / URINARY TRACT ULTRASOUND COMPLETE COMPARISON:  None Available. FINDINGS: Right Kidney: Renal measurements: 9.0 x 4.7 x 4.5 cm = volume: 98 mL. Echogenicity within normal limits. No mass or hydronephrosis visualized. Left Kidney: Renal measurements: 8.6 x 5.0 x 4.7 cm = volume: 104 mL. Echogenicity within normal limits. No mass or hydronephrosis visualized. Bladder: Appears normal for degree of bladder distention. Other: None. IMPRESSION: No hydronephrosis. Electronically Signed   By: Jearld Lesch M.D.   On: 07/03/2023 13:26   ECHOCARDIOGRAM COMPLETE  Result Date: 07/03/2023    ECHOCARDIOGRAM REPORT   Randall Name:   PHILLIP [name] Date of Exam: 07/03/2023 Medical Rec #:  [id number]          Height:       69.0 in Accession #:    [id number]         Weight:       201.1 lb Date of Birth:  [birth year]          BSA:          2.071 m Randall Age:    61 years           BP:           167/63 mmHg Randall Gender: M                  HR:           59 bpm. Exam Location:  [facility]  Procedure: 2D Echo, Cardiac Doppler and Color Doppler Indications:     Bacteremia R78.81  History:         Randall has no prior history of Echocardiogram examinations.                  CHF, Signs/Symptoms:Murmur; Risk Factors:Hypertension and                  Diabetes.  Sonographer:  Cristela Blue Referring Phys:  [id number] SONA PATEL Diagnosing Phys: Cristal Deer End MD IMPRESSIONS  1. Left ventricular ejection fraction, by estimation, is 60 to 65%. The left ventricle has normal function. Left ventricular endocardial border not optimally defined to evaluate regional wall motion. There is severe left ventricular hypertrophy. Left ventricular diastolic parameters are consistent with Grade II diastolic dysfunction (pseudonormalization).  2. Right ventricular systolic function is normal. The right ventricular size is normal. Tricuspid regurgitation signal is inadequate for assessing PA pressure.  3. The mitral valve is normal in structure. Trivial mitral valve regurgitation. No evidence of mitral stenosis.  4. The aortic valve is tricuspid. Aortic valve regurgitation is not visualized. No aortic stenosis is present.  5. The inferior vena cava is normal in size with greater than 50% respiratory variability, suggesting right atrial pressure of 3 mmHg. FINDINGS  Left Ventricle: Left ventricular ejection fraction, by estimation, is 60 to 65%. The left ventricle has normal function. Left ventricular endocardial border not optimally defined to evaluate regional wall motion. The left ventricular internal cavity size was normal in size. There is severe left ventricular hypertrophy. Left ventricular diastolic parameters are consistent with Grade II diastolic dysfunction (pseudonormalization). Right Ventricle: The right ventricular size is normal. No increase in right ventricular wall thickness. Right ventricular systolic function is normal. Tricuspid regurgitation signal is inadequate for assessing PA pressure. Left Atrium: Left  atrial size was normal in size. Right Atrium: Right atrial size was not well visualized. Pericardium: There is no evidence of pericardial effusion. Mitral Valve: The mitral valve is normal in structure. Trivial mitral valve regurgitation. No evidence of mitral valve stenosis. MV peak gradient, 4.4 mmHg. The mean mitral valve gradient is 2.0 mmHg. Tricuspid Valve: The tricuspid valve is not well visualized. Tricuspid valve regurgitation is trivial. Aortic Valve: The aortic valve is tricuspid. There is mild aortic valve annular calcification. Aortic valve regurgitation is not visualized. No aortic stenosis is present. Aortic valve mean gradient measures 5.7 mmHg. Aortic valve peak gradient measures 10.2 mmHg. Aortic valve area, by VTI measures 2.12 cm. Pulmonic Valve: The pulmonic valve was not well visualized. Pulmonic valve regurgitation is not visualized. No evidence of pulmonic stenosis. Aorta: The aortic root is normal in size and structure. Pulmonary Artery: The pulmonary artery is not well seen. Venous: The inferior vena cava is normal in size with greater than 50% respiratory variability, suggesting right atrial pressure of 3 mmHg. IAS/Shunts: The interatrial septum was not well visualized.  LEFT VENTRICLE PLAX 2D LVIDd:         4.40 cm   Diastology LVIDs:         2.80 cm   LV e' medial:    6.20 cm/s LV PW:         1.70 cm   LV E/e' medial:  14.5 LV IVS:        1.50 cm   LV e' lateral:   8.38 cm/s LVOT diam:     2.20 cm   LV E/e' lateral: 10.8 LV SV:         77 LV SV Index:   37 LVOT Area:     3.80 cm  RIGHT VENTRICLE RV Basal diam:  3.50 cm RV Mid diam:    3.10 cm LEFT ATRIUM             Index        RIGHT ATRIUM          Index LA diam:  3.20 cm 1.55 cm/m   RA Area:     7.20 cm LA Vol (A2C):   25.5 ml 12.31 ml/m  RA Volume:   11.50 ml 5.55 ml/m LA Vol (A4C):   21.0 ml 10.14 ml/m LA Biplane Vol: 24.4 ml 11.78 ml/m  AORTIC VALVE AV Area (Vmax):    1.94 cm AV Area (Vmean):   1.85 cm AV Area  (VTI):     2.12 cm AV Vmax:           159.33 cm/s AV Vmean:          113.000 cm/s AV VTI:            0.362 m AV Peak Grad:      10.2 mmHg AV Mean Grad:      5.7 mmHg LVOT Vmax:         81.40 cm/s LVOT Vmean:        55.000 cm/s LVOT VTI:          0.202 m LVOT/AV VTI ratio: 0.56  AORTA Ao Root diam: 3.30 cm MITRAL VALVE MV Area (PHT): 2.50 cm    SHUNTS MV Area VTI:   2.03 cm    Systemic VTI:  0.20 m MV Peak grad:  4.4 mmHg    Systemic Diam: 2.20 cm MV Mean grad:  2.0 mmHg MV Vmax:       1.05 m/s MV Vmean:      67.3 cm/s MV Decel Time: 303 msec MV E velocity: 90.10 cm/s MV A velocity: 90.70 cm/s MV E/A ratio:  0.99 Christopher End MD Electronically signed by Yvonne Kendall MD Signature Date/Time: 07/03/2023/1:10:59 PM    Final    CT Head Wo Contrast  Result Date: 06/30/2023 CLINICAL DATA:  Mental status change with unknown cause EXAM: CT HEAD WITHOUT CONTRAST TECHNIQUE: Contiguous axial images were obtained from the base of the skull through the vertex without intravenous contrast. RADIATION DOSE REDUCTION: This exam was performed according to the departmental dose-optimization program which includes automated exposure control, adjustment of the mA and/or kV according to Randall size and/or use of iterative reconstruction technique. COMPARISON:  Brain MRI 04/06/2023 FINDINGS: Brain: No evidence of acute infarction, hemorrhage, hydrocephalus, extra-axial collection or mass lesion/mass effect. Extensive chronic small vessel ischemia in the cerebral white matter with confluent low-density. Chronic lacunar infarct in the right pons. Ischemia. Premature cerebral volume loss for age. Vascular: No hyperdense vessel or unexpected calcification. Skull: Normal. Negative for fracture or focal lesion. Sinuses/Orbits: No acute finding. IMPRESSION: 1. No acute or reversible finding. 2. Advanced chronic small vessel Electronically Signed   By: Tiburcio Pea M.D.   On: 06/30/2023 10:54   DG Chest Port 1 View  Result Date:  06/30/2023 CLINICAL DATA:  Questionable sepsis, evaluate for abnormality. EXAM: PORTABLE CHEST 1 VIEW COMPARISON:  One-view chest x-ray 02/04/2023 FINDINGS: The heart size is exaggerated by low lung volumes. Atherosclerotic changes are present at the aortic arch. The lungs are clear. The visualized soft tissues and bony thorax are unremarkable. IMPRESSION: 1. Low lung volumes. 2. No acute cardiopulmonary disease. Electronically Signed   By: Marin Roberts M.D.   On: 06/30/2023 10:48      Labs: BNP (last 3 results) No results for input(s): "BNP" in the last 8760 hours. Basic Metabolic Panel: Recent Labs  Lab 07/02/23 0717 07/04/23 0455 07/05/23 0558 07/06/23 0347 07/07/23 0433 07/08/23 0538  NA 136  --  134* 134* 133* 133*  K 4.2  --  3.7 3.4* 3.6 3.4*  CL 104  --  97* 94* 94* 94*  CO2 22  --  28 28 27 28   GLUCOSE 82  --  95 88 98 83  BUN 31*  --  22 32* 24* 34*  CREATININE 4.27* 3.68* 3.40* 4.36* 4.28* 5.49*  CALCIUM 8.5*  --  8.7* 8.6* 8.8* 8.5*  MG  --   --  1.8 1.9 2.0 2.2  PHOS 3.2  --   --   --   --   --    Liver Function Tests: Recent Labs  Lab 07/02/23 0717  ALBUMIN 2.8*   No results for input(s): "LIPASE", "AMYLASE" in the last 168 hours. No results for input(s): "AMMONIA" in the last 168 hours. CBC: Recent Labs  Lab 07/04/23 0758 07/05/23 0558 07/06/23 0347 07/07/23 0433 07/08/23 0538  WBC 6.4 6.3 7.1 7.5 8.6  HGB 9.8* 10.1* 10.1* 10.5* 10.5*  HCT 28.6* 29.4* 30.0* 30.8* 30.2*  MCV 93.2 93.0 94.9 94.2 92.4  PLT 189 198 211 222 241   Cardiac Enzymes: No results for input(s): "CKTOTAL", "CKMB", "CKMBINDEX", "TROPONINI" in the last 168 hours. BNP: Invalid input(s): "POCBNP" CBG: Recent Labs  Lab 07/02/23 2228 07/03/23 1538  GLUCAP 145* 120*   D-Dimer No results for input(s): "DDIMER" in the last 72 hours. Hgb A1c No results for input(s): "HGBA1C" in the last 72 hours. Lipid Profile No results for input(s): "CHOL", "HDL", "LDLCALC", "TRIG",  "CHOLHDL", "LDLDIRECT" in the last 72 hours. Thyroid function studies No results for input(s): "TSH", "T4TOTAL", "T3FREE", "THYROIDAB" in the last 72 hours.  Invalid input(s): "FREET3" Anemia work up No results for input(s): "VITAMINB12", "FOLATE", "FERRITIN", "TIBC", "IRON", "RETICCTPCT" in the last 72 hours. Urinalysis    Component Value Date/Time   COLORURINE YELLOW (A) 06/30/2023 1027   APPEARANCEUR HAZY (A) 06/30/2023 1027   LABSPEC 1.015 06/30/2023 1027   PHURINE 7.0 06/30/2023 1027   GLUCOSEU NEGATIVE 06/30/2023 1027   HGBUR SMALL (A) 06/30/2023 1027   BILIRUBINUR NEGATIVE 06/30/2023 1027   KETONESUR NEGATIVE 06/30/2023 1027   PROTEINUR >=300 (A) 06/30/2023 1027   NITRITE NEGATIVE 06/30/2023 1027   LEUKOCYTESUR LARGE (A) 06/30/2023 1027   Sepsis Labs Recent Labs  Lab 07/05/23 0558 07/06/23 0347 07/07/23 0433 07/08/23 0538  WBC 6.3 7.1 7.5 8.6   Microbiology Recent Results (from the past 240 hour(s))  Resp panel by RT-PCR (RSV, Flu A&B, Covid) Anterior Nasal Swab     Status: None   Collection Time: 06/30/23 10:08 AM   Specimen: Anterior Nasal Swab  Result Value Ref Range Status   SARS Coronavirus 2 by RT PCR NEGATIVE NEGATIVE Final    Comment: (NOTE) SARS-CoV-2 target nucleic acids are NOT DETECTED.  The SARS-CoV-2 RNA is generally detectable in upper respiratory specimens during the acute phase of infection. The lowest concentration of SARS-CoV-2 viral copies this assay can detect is 138 copies/mL. A negative result does not preclude SARS-Cov-2 infection and should not be used as the sole basis for treatment or other Randall management decisions. A negative result may occur with  improper specimen collection/handling, submission of specimen other than nasopharyngeal swab, presence of viral mutation(s) within the areas targeted by this assay, and inadequate number of viral copies(<138 copies/mL). A negative result must be combined with clinical observations,  Randall history, and epidemiological information. The expected result is Negative.  Fact Sheet for Patients:  BloggerCourse.com  Fact Sheet for Healthcare Providers:  SeriousBroker.it  This test is no t yet approved or cleared by the [place] and  has been authorized for detection and/or diagnosis of SARS-CoV-2 by FDA under an Emergency Use Authorization (EUA). This EUA will remain  in effect (meaning this test can be used) for the duration of the COVID-19 declaration under Section 564(b)(1) of the Act, 21 U.S.C.section 360bbb-3(b)(1), unless the authorization is terminated  or revoked sooner.       Influenza A by PCR NEGATIVE NEGATIVE Final   Influenza B by PCR NEGATIVE NEGATIVE Final    Comment: (NOTE) The Xpert Xpress SARS-CoV-2/FLU/RSV plus assay is intended as an aid in the diagnosis of influenza from Nasopharyngeal swab specimens and should not be used as a sole basis for treatment. Nasal washings and aspirates are unacceptable for Xpert Xpress SARS-CoV-2/FLU/RSV testing.  Fact Sheet for Patients: BloggerCourse.com  Fact Sheet for Healthcare Providers: SeriousBroker.it  This test is not yet approved or cleared by the [place] FDA and has been authorized for detection and/or diagnosis of SARS-CoV-2 by FDA under an Emergency Use Authorization (EUA). This EUA will remain in effect (meaning this test can be used) for the duration of the COVID-19 declaration under Section 564(b)(1) of the Act, 21 U.S.C. section 360bbb-3(b)(1), unless the authorization is terminated or revoked.     Resp Syncytial Virus by PCR NEGATIVE NEGATIVE Final    Comment: (NOTE) Fact Sheet for Patients: BloggerCourse.com  Fact Sheet for Healthcare Providers: SeriousBroker.it  This test is not yet approved or cleared by the [place] FDA and has been authorized for detection and/or diagnosis of SARS-CoV-2 by FDA under an Emergency Use Authorization (EUA). This EUA will remain in effect (meaning this test can be used) for the duration of the COVID-19 declaration under Section 564(b)(1) of the Act, 21 U.S.C. section 360bbb-3(b)(1), unless the authorization is terminated or revoked.  Performed at [facility], [address] [place], [place] [address]   Blood Culture (routine x 2)     Status: Abnormal   Collection Time: 06/30/23 10:27 AM   Specimen: BLOOD  Result Value Ref Range Status   Specimen Description   Final    BLOOD BLOOD LEFT ARM Performed at [facility], [address] [place], [place] [address]    Special Requests   Final    BOTTLES DRAWN AEROBIC AND ANAEROBIC Blood Culture adequate volume Performed at [facility], [address] [place], [place] [address]    Culture  Setup Time   Final    GRAM POSITIVE COCCI AEROBIC BOTTLE ONLY Organism ID to follow CRITICAL RESULT CALLED TO, READ BACK BY AND VERIFIED WITH: JASON ROBBINS @ 0604 07/01/23 LFD GRAM STAIN REVIEWED-AGREE WITH RESULT Performed at [facility], [address], [place], [place] [address]    Culture ENTEROCOCCUS FAECALIS (A)  Final   Report Status 07/04/2023 FINAL  Final   Organism ID, Bacteria ENTEROCOCCUS FAECALIS  Final      Susceptibility   Enterococcus faecalis - MIC*    AMPICILLIN <=2 SENSITIVE Sensitive     VANCOMYCIN 1 SENSITIVE Sensitive     GENTAMICIN SYNERGY SENSITIVE Sensitive     * ENTEROCOCCUS FAECALIS  Blood Culture (routine x 2)     Status: None   Collection Time: 06/30/23 10:27 AM   Specimen: BLOOD  Result Value Ref Range Status   Specimen Description BLOOD LEFT ANTECUBITAL  Final   Special Requests   Final    BOTTLES DRAWN AEROBIC AND ANAEROBIC Blood Culture adequate volume   Culture   Final    NO GROWTH 5 DAYS Performed  at [facility], [address] [place], [place] [address]    Report Status 07/05/2023 FINAL  Final  Urine Culture     Status: Abnormal   Collection Time: 06/30/23 10:27 AM   Specimen: Urine, Random  Result Value Ref Range Status   Specimen Description   Final    URINE, RANDOM Performed at [facility], [address] [place], [place] [address]    Special Requests   Final    NONE Reflexed from [id number] Performed at [facility], [address] [place], [place] [address]    Culture >=100,000 COLONIES/mL ENTEROCOCCUS FAECALIS (A)  Final   Report Status 07/02/2023 FINAL  Final   Organism ID, Bacteria ENTEROCOCCUS FAECALIS (A)  Final      Susceptibility   Enterococcus faecalis - MIC*    AMPICILLIN <=2 SENSITIVE Sensitive     NITROFURANTOIN <=16 SENSITIVE Sensitive     VANCOMYCIN <=0.5 SENSITIVE Sensitive     * >=100,000 COLONIES/mL ENTEROCOCCUS FAECALIS  Blood Culture ID Panel (Reflexed)     Status: Abnormal   Collection Time: 06/30/23 10:27 AM  Result Value Ref Range Status   Enterococcus faecalis DETECTED (A) NOT DETECTED Final    Comment: CRITICAL RESULT CALLED TO, READ BACK BY AND VERIFIED WITH: JASON ROBBINS @ 0604 07/01/23 LFD    Enterococcus Faecium NOT DETECTED NOT DETECTED Final   Listeria monocytogenes NOT DETECTED NOT DETECTED Final   Staphylococcus species NOT DETECTED NOT DETECTED Final   Staphylococcus aureus (BCID) NOT DETECTED NOT DETECTED Final   Staphylococcus epidermidis NOT DETECTED NOT DETECTED Final   Staphylococcus lugdunensis NOT DETECTED NOT DETECTED Final   Streptococcus species NOT DETECTED NOT DETECTED Final   Streptococcus agalactiae NOT DETECTED NOT DETECTED Final   Streptococcus pneumoniae NOT DETECTED NOT DETECTED Final   Streptococcus pyogenes NOT DETECTED NOT DETECTED Final   A.calcoaceticus-baumannii NOT DETECTED NOT DETECTED Final   Bacteroides fragilis NOT DETECTED NOT DETECTED Final   Enterobacterales NOT DETECTED NOT DETECTED Final   Enterobacter  cloacae complex NOT DETECTED NOT DETECTED Final   Escherichia coli NOT DETECTED NOT DETECTED Final   Klebsiella aerogenes NOT DETECTED NOT DETECTED Final   Klebsiella oxytoca NOT DETECTED NOT DETECTED Final   Klebsiella pneumoniae NOT DETECTED NOT DETECTED Final   Proteus species NOT DETECTED NOT DETECTED Final   Salmonella species NOT DETECTED NOT DETECTED Final   Serratia marcescens NOT DETECTED NOT DETECTED Final   Haemophilus influenzae NOT DETECTED NOT DETECTED Final   Neisseria meningitidis NOT DETECTED NOT DETECTED Final   Pseudomonas aeruginosa NOT DETECTED NOT DETECTED Final   Stenotrophomonas maltophilia NOT DETECTED NOT DETECTED Final   Candida albicans NOT DETECTED NOT DETECTED Final   Candida auris NOT DETECTED NOT DETECTED Final   Candida glabrata NOT DETECTED NOT DETECTED Final   Candida krusei NOT DETECTED NOT DETECTED Final   Candida parapsilosis NOT DETECTED NOT DETECTED Final   Candida tropicalis NOT DETECTED NOT DETECTED Final   Cryptococcus neoformans/gattii NOT DETECTED NOT DETECTED Final   Vancomycin resistance NOT DETECTED NOT DETECTED Final    Comment: Performed at [facility], [address] [place], [place] [address]  Culture, blood (Routine X 2) w Reflex to ID Panel     Status: None   Collection Time: 07/03/23  8:31 AM   Specimen: BLOOD  Result Value Ref Range Status   Specimen Description BLOOD BLOOD LEFT HAND  Final   Special Requests   Final    BOTTLES DRAWN AEROBIC AND  ANAEROBIC Blood Culture adequate volume   Culture   Final    NO GROWTH 5 DAYS Performed at [facility], [address] [place], [place] [address]    Report Status 07/08/2023 FINAL  Final  Culture, blood (Routine X 2) w Reflex to ID Panel     Status: None   Collection Time: 07/03/23  8:31 AM   Specimen: BLOOD  Result Value Ref Range Status   Specimen Description BLOOD LEFT [facility]  Final   Special Requests   Final    BOTTLES DRAWN AEROBIC AND ANAEROBIC Blood  Culture adequate volume   Culture   Final    NO GROWTH 5 DAYS Performed at [facility], [address] [place], [place] [address]    Report Status 07/08/2023 FINAL  Final     Total time spend on discharging this Randall, including the last Randall exam, discussing the hospital stay, instructions for ongoing care as it relates to all pertinent caregivers, as well as preparing the medical discharge records, prescriptions, and/or referrals as applicable, is 45 minutes.    Darlin Priestly, MD  [facility] 07/08/2023, 11:39 AM

## 2023-07-08 NOTE — Plan of Care (Signed)

## 2023-07-20 ENCOUNTER — Telehealth: Payer: Self-pay

## 2023-07-20 ENCOUNTER — Other Ambulatory Visit: Payer: Self-pay

## 2023-07-20 DIAGNOSIS — N400 Enlarged prostate without lower urinary tract symptoms: Secondary | ICD-10-CM

## 2023-07-20 NOTE — Telephone Encounter (Signed)
Per Dr. Rivka Safer she would like for the patient to have blood cultures drawn at dialysis next week. I have faxed orders to [facility] in [place].  I have also placed a urology referral per Dr. Lynne Logan orders. [facility] [phone] Fax [phone]

## 2023-07-24 ENCOUNTER — Inpatient Hospital Stay: Payer: Medicare HMO | Admitting: Infectious Diseases

## 2023-07-31 ENCOUNTER — Ambulatory Visit: Payer: Medicare HMO | Attending: Infectious Diseases | Admitting: Infectious Diseases

## 2023-07-31 VITALS — BP 130/59 | HR 80 | Temp 94.1°F

## 2023-07-31 DIAGNOSIS — Z09 Encounter for follow-up examination after completed treatment for conditions other than malignant neoplasm: Secondary | ICD-10-CM | POA: Diagnosis present

## 2023-07-31 DIAGNOSIS — I132 Hypertensive heart and chronic kidney disease with heart failure and with stage 5 chronic kidney disease, or end stage renal disease: Secondary | ICD-10-CM | POA: Diagnosis not present

## 2023-07-31 DIAGNOSIS — B952 Enterococcus as the cause of diseases classified elsewhere: Secondary | ICD-10-CM

## 2023-07-31 DIAGNOSIS — R7881 Bacteremia: Secondary | ICD-10-CM

## 2023-07-31 DIAGNOSIS — E1122 Type 2 diabetes mellitus with diabetic chronic kidney disease: Secondary | ICD-10-CM | POA: Insufficient documentation

## 2023-07-31 DIAGNOSIS — R32 Unspecified urinary incontinence: Secondary | ICD-10-CM | POA: Diagnosis not present

## 2023-07-31 DIAGNOSIS — N186 End stage renal disease: Secondary | ICD-10-CM | POA: Diagnosis not present

## 2023-07-31 DIAGNOSIS — Z992 Dependence on renal dialysis: Secondary | ICD-10-CM | POA: Insufficient documentation

## 2023-07-31 DIAGNOSIS — I5022 Chronic systolic (congestive) heart failure: Secondary | ICD-10-CM | POA: Diagnosis not present

## 2023-07-31 DIAGNOSIS — N390 Urinary tract infection, site not specified: Secondary | ICD-10-CM

## 2023-07-31 NOTE — Progress Notes (Signed)
NAME: Randall [name]  DOB: [birth year]  MRN: [id number]  Date/Time: 07/31/2023 9:53 AM  Subjective:  Pt here with his sister Randall [name] is a 62 y.o. with a history of end-stage renal disease on dialysis, diabetes mellitus, chronic systolic CHF, dementia with behavioral disturbance, CVA, Follow up visit after recent hospital stay between 06/30/23-07/08/23 for enterococcus bacteremia and complicated UTI  He presented with hematuria He has incontinence- PVR was 58 and another time was 260 HE completed 2 weeks of vancomycin given at dialysis center on 07/15/23 and a repeat blood culture done at dialysis center last week is negative at day 4 He is doing better as per his sister- since discharge seen at [facility] for evaluation of kidney transplant and undergoing many tests ?   Past Medical History:  Diagnosis Date   Anemia    Anxiety    Arthritis    CHF (congestive heart failure) (HCC)    Chronic kidney disease    Dementia (HCC)    Depression    Diabetes mellitus without complication (HCC)    GERD (gastroesophageal reflux disease)    Heart murmur    Hypertension    Hypertensive urgency 12/09/2021   Pneumonia    Stroke [facility])     Past Surgical History:  Procedure Laterality Date   AV FISTULA PLACEMENT Right 06/15/2022   Procedure: ARTERIOVENOUS (AV) FISTULA CREATION (BRACHIALCEPHALIC);  Surgeon: Annice Needy, MD;  Location: [facility] ORS;  Service: Vascular;  Laterality: Right;   COLONOSCOPY W/ POLYPECTOMY     dental work     DIALYSIS/PERMA CATHETER INSERTION N/A 12/30/2021   Procedure: DIALYSIS/PERMA CATHETER INSERTION;  Surgeon: Annice Needy, MD;  Location: [facility] INVASIVE CV LAB;  Service: Cardiovascular;  Laterality: N/A;   DIALYSIS/PERMA CATHETER REMOVAL N/A 10/19/2022   Procedure: DIALYSIS/PERMA CATHETER REMOVAL;  Surgeon: Annice Needy, MD;  Location: [facility] INVASIVE CV LAB;  Service: Cardiovascular;  Laterality: N/A;   ESOPHAGOGASTRODUODENOSCOPY (EGD) WITH PROPOFOL N/A 12/26/2021    Procedure: ESOPHAGOGASTRODUODENOSCOPY (EGD) WITH PROPOFOL;  Surgeon: Jaynie Collins, DO;  Location[facility] ENDOSCOPY;  Service: Gastroenterology;  Laterality: N/A;   TEMPORARY DIALYSIS CATHETER N/A 12/27/2021   Procedure: TEMPORARY DIALYSIS CATHETER;  Surgeon: Renford Dills, MD;  Location: [facility] INVASIVE CV LAB;  Service: Cardiovascular;  Laterality: N/A;   TONSILLECTOMY     adeniods removed    Social History   Socioeconomic History   Marital status: Divorced    Spouse name: Not on file   Number of children: Not on file   Years of education: Not on file   Highest education level: Not on file  Occupational History   Not on file  Tobacco Use   Smoking status: Former    Current packs/day: 0.25    Types: Cigars, Cigarettes   Smokeless tobacco: Not on file  Substance and Sexual Activity   Alcohol use: Yes    Alcohol/week: 1.0 standard drink of alcohol    Types: 1 Glasses of wine per week    Comment: rarely   Drug use: Never   Sexual activity: Not Currently  Other Topics Concern   Not on file  Social History Narrative   Hovsep, Sine (Sister) lives with sister   Social Determinants of Health   Financial Resource Strain: High Risk (01/26/2022)   Received from [facility], [facility]   Overall Financial Resource Strain (CARDIA)    Difficulty of Paying Living Expenses: Hard  Food Insecurity: No Food Insecurity (06/30/2023)   Hunger Vital Sign  Worried About Programme researcher, broadcasting/film/video in the Last Year: Never true    Ran Out of Food in the Last Year: Never true  Transportation Needs: No Transportation Needs (06/30/2023)   PRAPARE - Administrator, Civil Service (Medical): No    Lack of Transportation (Non-Medical): No  Physical Activity: Insufficiently Active (08/18/2021)   Received from [facility], [facility]   Exercise Vital Sign    Days of Exercise per Week: 2 days    Minutes of Exercise per Session: 20 min  Stress: Stress Concern Present  (01/26/2022)   Received from [facility], [facility] Occupational [facility] - Occupational Stress Questionnaire    Feeling of Stress : To some extent  Social Connections: Moderately Integrated (08/18/2021)   Received from [facility], [facility]   Social Connection and Isolation Panel [NHANES]    Frequency of Communication with Friends and Family: More than three times a week    Frequency of Social Gatherings with Friends and Family: Once a week    Attends Religious Services: 1 to 4 times per year    Active Member of [employer] or Organizations: Yes    Attends Banker Meetings: Never    Marital Status: Separated  Intimate Partner Violence: Not At Risk (06/30/2023)   Humiliation, Afraid, Rape, and Kick questionnaire    Fear of Current or Ex-Partner: No    Emotionally Abused: No    Physically Abused: No    Sexually Abused: No    Family History  Problem Relation Age of Onset   Hypertension Mother    Dementia Mother    Hypertension Maternal Grandmother    Diabetes Maternal Grandmother    Stroke Maternal Grandmother    Heart attack Maternal Grandmother    Stroke Maternal Grandfather    Allergies  Allergen Reactions   Penicillins     Reaction in childhood - unknown reaction Patient states he has tolerated amoxicillin.      I? Current Outpatient Medications  Medication Sig Dispense Refill   aspirin 81 MG EC tablet Take 81 mg by mouth daily.     carvedilol (COREG) 6.25 MG tablet Take 6.25 mg by mouth 2 (two) times daily with a meal.     lansoprazole (PREVACID) 30 MG capsule Take 30 mg by mouth daily.     lidocaine-prilocaine (EMLA) cream Use 3 times weekly before dialysis.     rosuvastatin (CRESTOR) 10 MG tablet Take 10 mg by mouth daily.     sertraline (ZOLOFT) 100 MG tablet Take 100 mg by mouth daily.     tamsulosin (FLOMAX) 0.4 MG CAPS capsule Take 0.4 mg by mouth at bedtime.     No current facility-administered medications for  this visit.     Abtx:  Anti-infectives (From admission, onward)    None       REVIEW OF SYSTEMS:  Const: negative fever, negative chills, negative weight loss Eyes: negative diplopia or visual changes, negative eye pain ENT: negative coryza, negative sore throat Resp: negative cough, hemoptysis, dyspnea Cards: negative for chest pain, palpitations, lower extremity edema GU: negative for frequency, dysuria and hematuria GI: Negative for abdominal pain, diarrhea, bleeding, constipation Skin: negative for rash and pruritus Heme: negative for easy bruising and gum/nose bleeding MS: negative for myalgias, arthralgias, back pain and muscle weakness Neurolo:negative for headaches, dizziness, vertigo, memory problems  Psych: negative for feelings of anxiety, depression  Endocrine: negative for thyroid, diabetes Allergy/Immunology-  negative for any medication or food allergies ? Pertinent Positives include : Objective:  VITALS:  BP (!) 130/59   Pulse 80   Temp (!) 94.1 F (34.5 C) (Temporal)   PHYSICAL EXAM:  General: Alert,  no distress, appears stated age.  Eyes: Conjunctivae clear, anicteric sclerae. Pupils are equal ENT Nares normal. No drainage or sinus tenderness. Lips, mucosa, and tongue normal. No Thrush Neck: Supple, symmetrical, no adenopathy, thyroid: non tender no carotid bruit and no JVD. Back: No CVA tenderness. Lungs: Clear to auscultation bilaterally. No Wheezing or Rhonchi. No rales. Heart: Regular rate and rhythm, no murmur, rub or gallop. Abdomen: Soft, non-tender,not distended. Bowel sounds normal. No masses Extremities: atraumatic, no cyanosis. No edema. No clubbing Skin: No rashes or lesions. Or bruising Lymph: Cervical, supraclavicular normal. Neurologic: not examined in detail Pertinent Labs Lab Results CBC    Component Value Date/Time   WBC 8.6 07/08/2023 0538   RBC 3.27 (L) 07/08/2023 0538   HGB 10.5 (L) 07/08/2023 0538   HCT 30.2 (L)  07/08/2023 0538   PLT 241 07/08/2023 0538   MCV 92.4 07/08/2023 0538   MCH 32.1 07/08/2023 0538   MCHC 34.8 07/08/2023 0538   RDW 12.5 07/08/2023 0538   LYMPHSABS 0.5 (L) 06/30/2023 1008   MONOABS 0.3 06/30/2023 1008   EOSABS 0.1 06/30/2023 1008   BASOSABS 0.0 06/30/2023 1008       Latest Ref Rng & Units 07/08/2023    5:38 AM 07/07/2023    4:33 AM 07/06/2023    3:47 AM  CMP  Glucose 70 - 99 mg/dL 83  98  88   BUN 8 - 23 mg/dL 34  24  32   Creatinine 0.61 - 1.24 mg/dL 8.29  5.62  1.30   Sodium 135 - 145 mmol/L 133  133  134   Potassium 3.5 - 5.1 mmol/L 3.4  3.6  3.4   Chloride 98 - 111 mmol/L 94  94  94   CO2 22 - 32 mmol/L 28  27  28    Calcium 8.9 - 10.3 mg/dL 8.5  8.8  8.6       Microbiology: BC- NG  IMAGING RESULTS: I have personally reviewed the films ? Impression/Recommendation ?Enterococcus bacteremia with enterococcus UTI- treated with 2 weeks of IV vancomycin Repeat blood culture 10 days after negative  H/o hematuria and has urinary incontinence - referred to urology for ruling out BPH  ESRD on dialysis being worked up for renal transplant  Discussed with patient and sister He is discharged from my clinic- follow up PRN ? ? ___________________________________________________  Note:  This document was prepared using Conservation officer, historic buildings and may include unintentional dictation errors.

## 2023-08-02 ENCOUNTER — Inpatient Hospital Stay: Payer: Medicare HMO | Admitting: Infectious Diseases

## 2023-08-21 ENCOUNTER — Ambulatory Visit: Payer: Medicare HMO | Admitting: Urology

## 2023-08-21 VITALS — BP 157/89 | HR 70 | Ht 69.0 in | Wt 201.4 lb

## 2023-08-21 DIAGNOSIS — N400 Enlarged prostate without lower urinary tract symptoms: Secondary | ICD-10-CM

## 2023-08-21 LAB — BLADDER SCAN AMB NON-IMAGING: Scan Result: 50

## 2023-08-22 NOTE — Progress Notes (Signed)
Randall [name],acting as a scribe for Randall Scotland, MD.,have documented all relevant documentation on the behalf of Randall Scotland, MD,as directed by  Randall Scotland, MD while in the presence of Randall Scotland, MD.  08/22/2023 9:00 AM   Randall [name] Apr 07, 1961 [id number]  Referring provider: Gabriel Earing, MD [address] [place],  [place] [address]  Chief Complaint  Patient presents with   Benign Prostatic Hypertrophy    HPI: 62 year-old male who presents today or further evaluation of BPH and recent urinary tract infection.   He has a personal history of end stage renal disease and hemodialiysis. Most recently in August, he was admitted with severe sepsis, secondary to enterococcus bacteremia from a urinary tract infection. He was treated with IV antibiotics for 2 weeks. He has been followed up with infectious disease as an outpatient. Notably, he is undergoing pre-transplantation evaluation.   Review of historical imaging from [facility] in 2023 shows prostatomegaly with some mild mass effect into the bladder.   He had his PVR checked several times during the hospital admission and is emptying reasonably well.   His most recent PSA in 07/2023 was 1.59.  Today, he reports urgency, frequency, and occasional incontinence but is minimally bothered by these symptoms. He currently wears Depends or the past 6 months and often does not make it to the bathroom in time. He is currently on Flomax and has been on that historically. He is unable to void today.    He does still make urine but unable to quantify how much.  No personal history of urinary tract infection prior to this.  He was not having any urinary symptoms at the time of his sepsis presentation.  He has a personal history of a stroke.   He is accompanied today by his sister.  He is a relatively poor historian and not able to contribute much to his history today.  Results for orders placed or performed in visit on  08/21/23  Bladder Scan (Post Void Residual) in office  Result Value Ref Range   Scan Result 50 ml     IPSS     Row Name 08/21/23 1600         International Prostate Symptom Score   How often have you had the sensation of not emptying your bladder? Less than 1 in 5     How often have you had to urinate less than every two hours? Less than half the time     How often have you found you stopped and started again several times when you urinated? Less than 1 in 5 times     How often have you found it difficult to postpone urination? Almost always     How often have you had a weak urinary stream? Not at All     How often have you had to strain to start urination? Not at All     How many times did you typically get up at night to urinate? 1 Time     Total IPSS Score 10       Quality of Life due to urinary symptoms   If you were to spend the rest of your life with your urinary condition just the way it is now how would you feel about that? Mostly Disatisfied              Score:  1-7 Mild 8-19 Moderate 20-35 Severe   PMH: Past Medical History:  Diagnosis Date  Anemia    Anxiety    Arthritis    CHF (congestive heart failure) (HCC)    Chronic kidney disease    Dementia (HCC)    Depression    Diabetes mellitus without complication (HCC)    GERD (gastroesophageal reflux disease)    Heart murmur    Hypertension    Hypertensive urgency 12/09/2021   Pneumonia    Stroke [facility])     Surgical History: Past Surgical History:  Procedure Laterality Date   AV FISTULA PLACEMENT Right 06/15/2022   Procedure: ARTERIOVENOUS (AV) FISTULA CREATION (BRACHIALCEPHALIC);  Surgeon: Annice Needy, MD;  Location: [facility] ORS;  Service: Vascular;  Laterality: Right;   COLONOSCOPY W/ POLYPECTOMY     dental work     DIALYSIS/PERMA CATHETER INSERTION N/A 12/30/2021   Procedure: DIALYSIS/PERMA CATHETER INSERTION;  Surgeon: Annice Needy, MD;  Location: [facility] INVASIVE CV LAB;  Service: Cardiovascular;   Laterality: N/A;   DIALYSIS/PERMA CATHETER REMOVAL N/A 10/19/2022   Procedure: DIALYSIS/PERMA CATHETER REMOVAL;  Surgeon: Annice Needy, MD;  Location: [facility] INVASIVE CV LAB;  Service: Cardiovascular;  Laterality: N/A;   ESOPHAGOGASTRODUODENOSCOPY (EGD) WITH PROPOFOL N/A 12/26/2021   Procedure: ESOPHAGOGASTRODUODENOSCOPY (EGD) WITH PROPOFOL;  Surgeon: Jaynie Collins, DO;  Location: [facility] ENDOSCOPY;  Service: Gastroenterology;  Laterality: N/A;   TEMPORARY DIALYSIS CATHETER N/A 12/27/2021   Procedure: TEMPORARY DIALYSIS CATHETER;  Surgeon: Renford Dills, MD;  Location: [facility] INVASIVE CV LAB;  Service: Cardiovascular;  Laterality: N/A;   TONSILLECTOMY     adeniods removed    Home Medications:  Allergies as of 08/21/2023       Reactions   Penicillins    Reaction in childhood - unknown reaction Patient states he has tolerated amoxicillin.          Medication List        Accurate as of August 21, 2023 11:59 PM. If you have any questions, ask your nurse or doctor.          aspirin EC 81 MG tablet Take 81 mg by mouth daily.   carvedilol 6.25 MG tablet Commonly known as: COREG Take 6.25 mg by mouth 2 (two) times daily with a meal.   lansoprazole 30 MG capsule Commonly known as: PREVACID Take 30 mg by mouth daily.   lidocaine-prilocaine cream Commonly known as: EMLA Use 3 times weekly before dialysis.   rosuvastatin 10 MG tablet Commonly known as: CRESTOR Take 10 mg by mouth daily.   sertraline 100 MG tablet Commonly known as: ZOLOFT Take 100 mg by mouth daily.   tamsulosin 0.4 MG Caps capsule Commonly known as: FLOMAX Take 0.4 mg by mouth at bedtime.        Allergies:  Allergies  Allergen Reactions   Penicillins     Reaction in childhood - unknown reaction Patient states he has tolerated amoxicillin.       Family History: Family History  Problem Relation Age of Onset   Hypertension Mother    Dementia Mother    Hypertension Maternal  Grandmother    Diabetes Maternal Grandmother    Stroke Maternal Grandmother    Heart attack Maternal Grandmother    Stroke Maternal Grandfather     Social History:  reports that he has quit smoking. His smoking use included cigars and cigarettes. He does not have any smokeless tobacco history on file. He reports current alcohol use of about 1.0 standard drink of alcohol per week. He reports that he does not use drugs.   Physical Exam: BP Marland Kitchen)  157/89   Pulse 70   Ht 5\' 9"  (1.753 m)   Wt 201 lb 6 oz (91.3 kg)   BMI 29.74 kg/m   Constitutional:  Alert and oriented, No acute distress. HEENT: [place] AT, moist mucus membranes.  Trachea midline, no masses. Neurologic: Grossly intact, no focal deficits, moving all 4 extremities. Psychiatric: Normal mood and affect.   Assessment & Plan:    1. BPH - Continue Flomax - He does have prostatomegaly on CT, but is otherwise minimally bothered by his urinary symptoms.  - He also has some urgency frequency and occasional incontinence, but again, no bother. - Hesitant to start on any new medication in an absence of bother and there is a concern for exacerbating emptying - It is unclear how much urine he actually makes on a daily basis - PSA is within normal limits,  - Defer DRE due to comorbidities and normal PSA - He is emptying adequately - Discussed how stroke could be contributing to his urinary symptoms  2. UTI - Recent UTI appears isolated, no further intervention unless recurrence occurs - Emptying reasonably well - Unable to provide a urine sample today  Will contact Dr. Rivka Safer about today's visit.   F/u prn  I have reviewed the above documentation for accuracy and completeness, and I agree with the above.   Randall Scotland, MD   [facility] [address] [place], [place] [address] [phone]  I spent 46 total minutes on the day of the encounter including pre-visit review of the  medical record, face-to-face time with the patient, and post visit ordering of labs/imaging/tests.

## 2024-01-30 IMAGING — CT CT HEAD W/O CM
4 series · 16 of 47 positions shown, 18 images · non-contrast
Comparison: None.

CLINICAL DATA: Vomiting and altered mental status, initial
encounter



[Series 2: head bone · axial · 0.46mm/px · z∈[-84,-52]mm · 3 of 84 slices shown]
[im 9/84  bone]
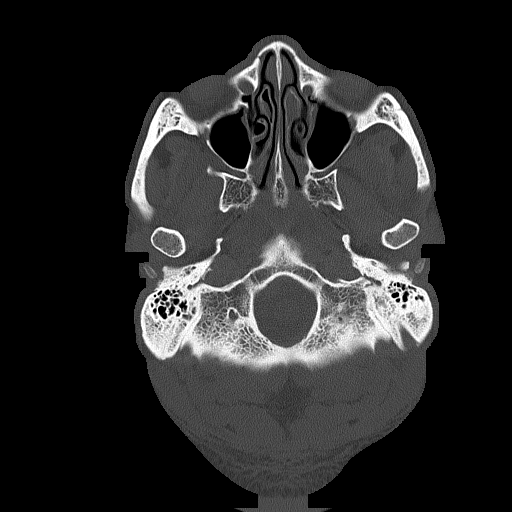
[im 17/84  bone]
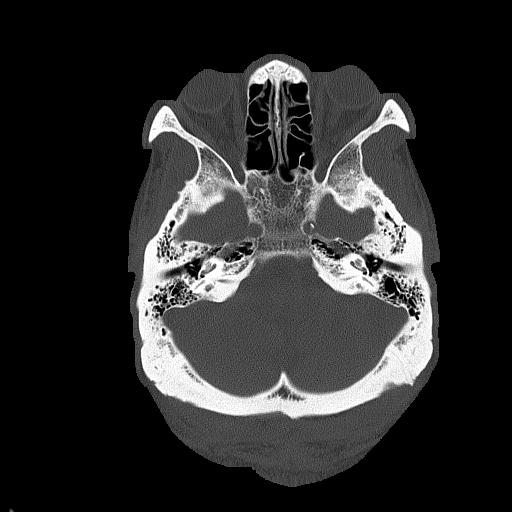
[im 25/84  bone]
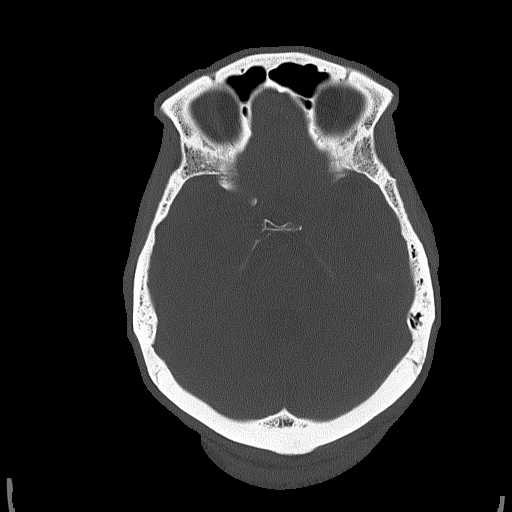

[Series 3: head wo · axial · 0.46mm/px · z∈[-80,+40]mm · 7 of 34 slices shown, 9 images]
[im 5/34  brain]
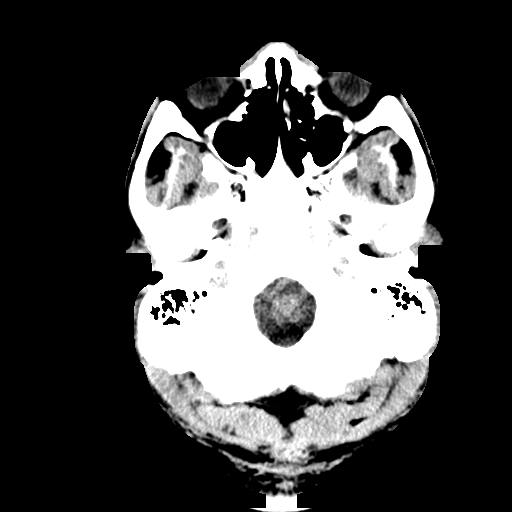
[im 5/34  bone]
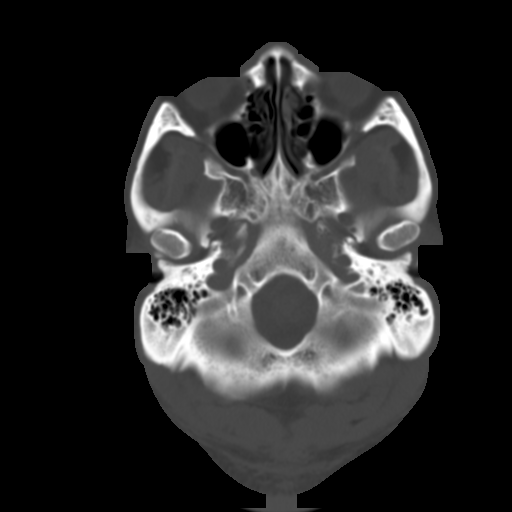
[im 9/34  brain]
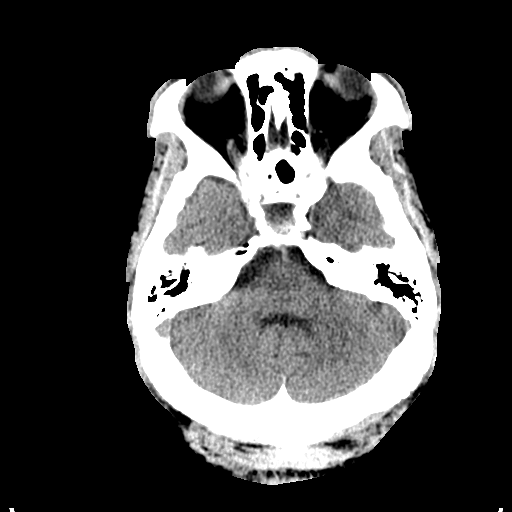
[im 13/34  brain]
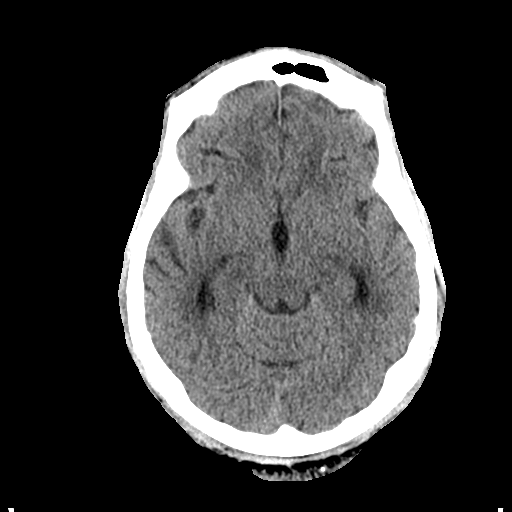
[im 17/34  brain]
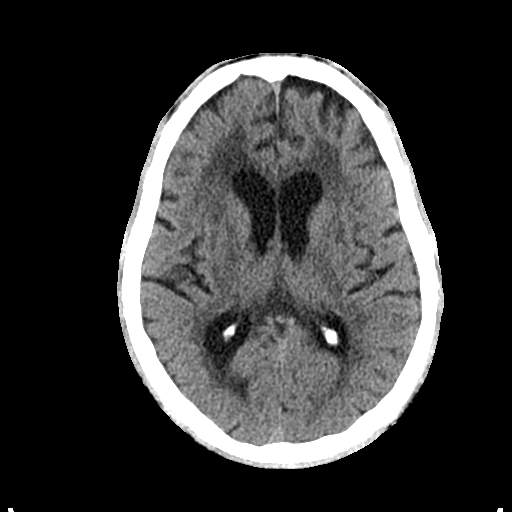
[im 21/34  brain]
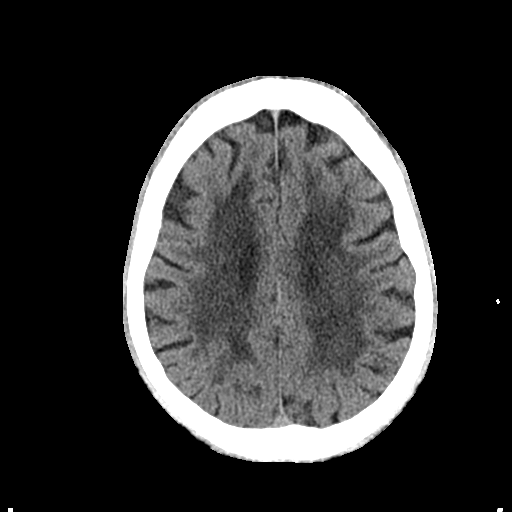
[im 21/34  bone]
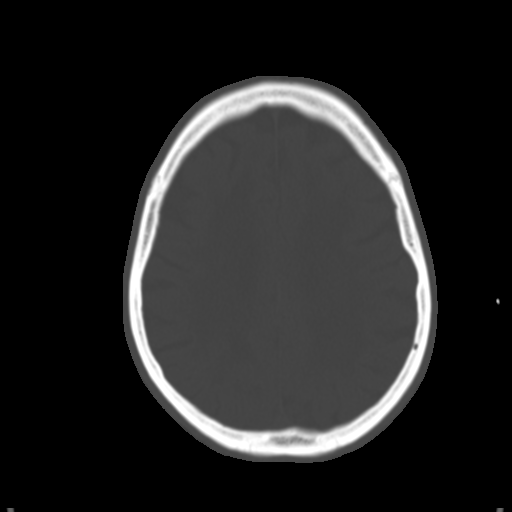
[im 25/34  brain]
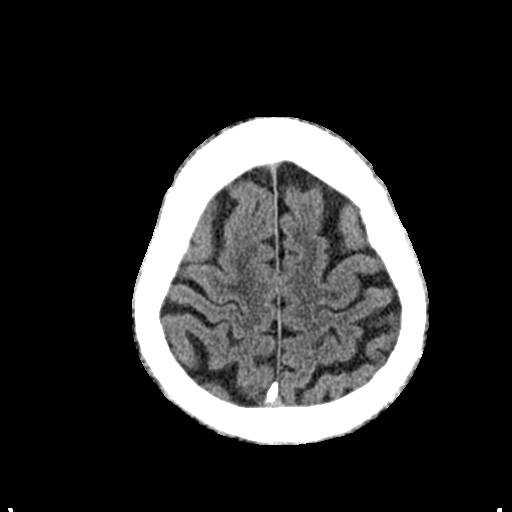
[im 29/34  brain]
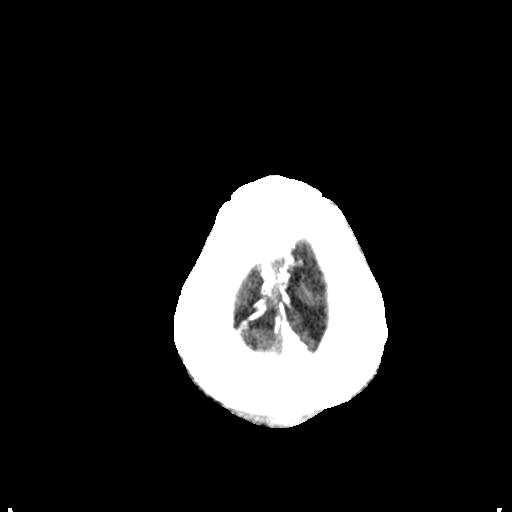

[Series 4: coronal soft tissue · coronal · 0.33mm/px · 3 of 75 slices shown]
[im 25/75  brain]
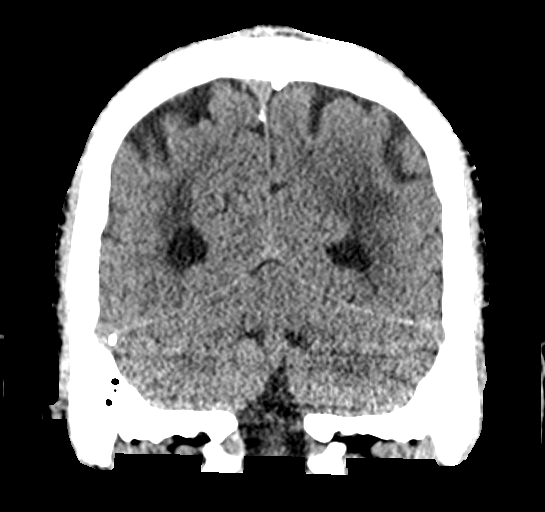
[im 33/75  brain]
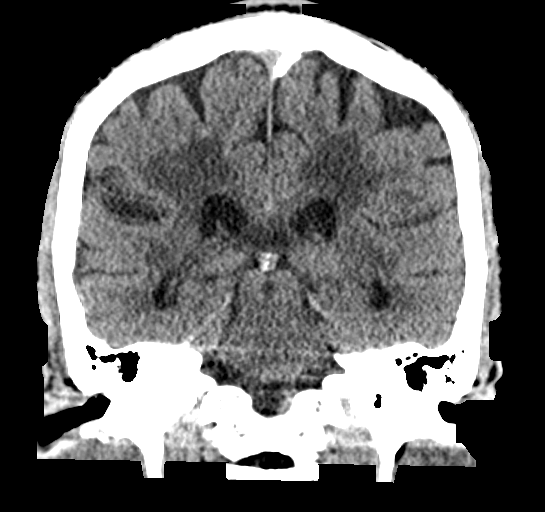
[im 42/75  brain]
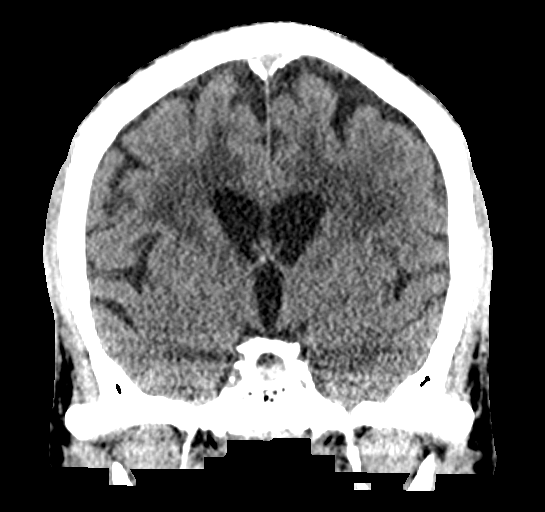

[Series 5: sagittal soft tissue · sagittal · 0.33mm/px · 3 of 58 slices shown]
[im 20/58  brain]
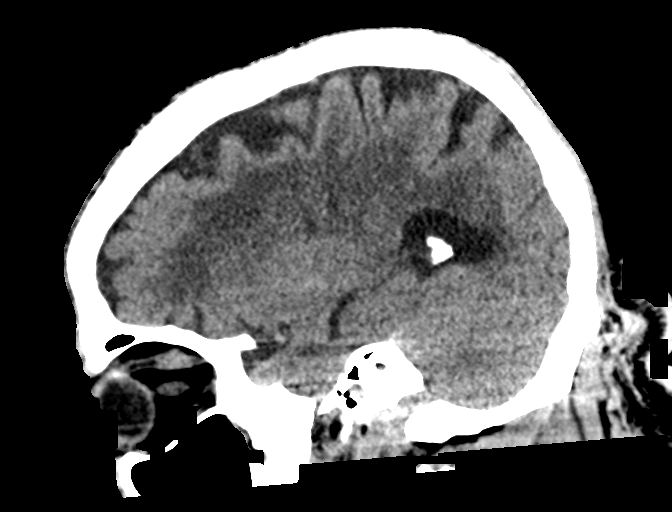
[im 29/58  brain]
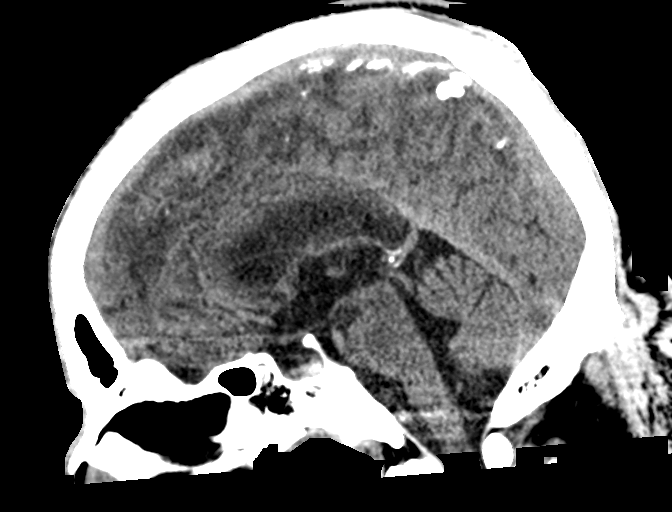
[im 39/58  brain]
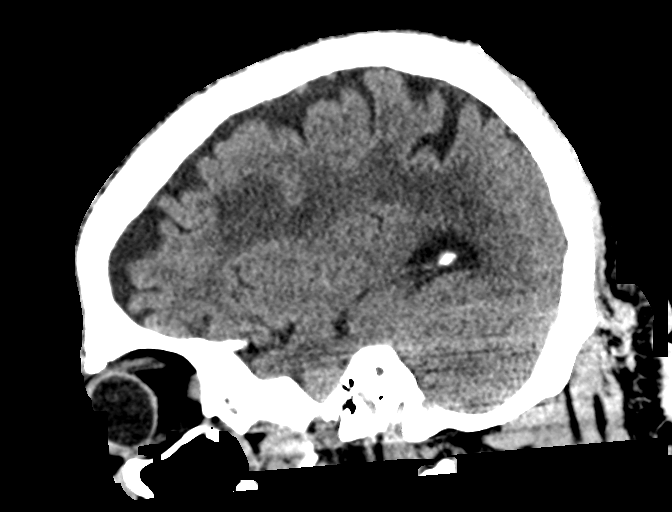

[16 of 47 positions shown; findings below may reference images not displayed]

FINDINGS: Brain: No evidence of acute infarction, hemorrhage, hydrocephalus,
extra-axial collection or mass lesion/mass effect. Chronic white
matter ischemic changes are noted.

Vascular: No hyperdense vessel or unexpected calcification.

Skull: Normal. Negative for fracture or focal lesion.

Sinuses/Orbits: No acute finding.

Other: None.
IMPRESSION: Chronic white matter ischemic changes without acute abnormality.

## 2024-01-30 IMAGING — DX DG CHEST 1V PORT
1 series · 1 of 1 positions shown · non-contrast
Comparison: 01/29/2006

CLINICAL DATA: Altered mental status, weakness

EXAM:
PORTABLE CHEST 1 VIEW

[chest ap]
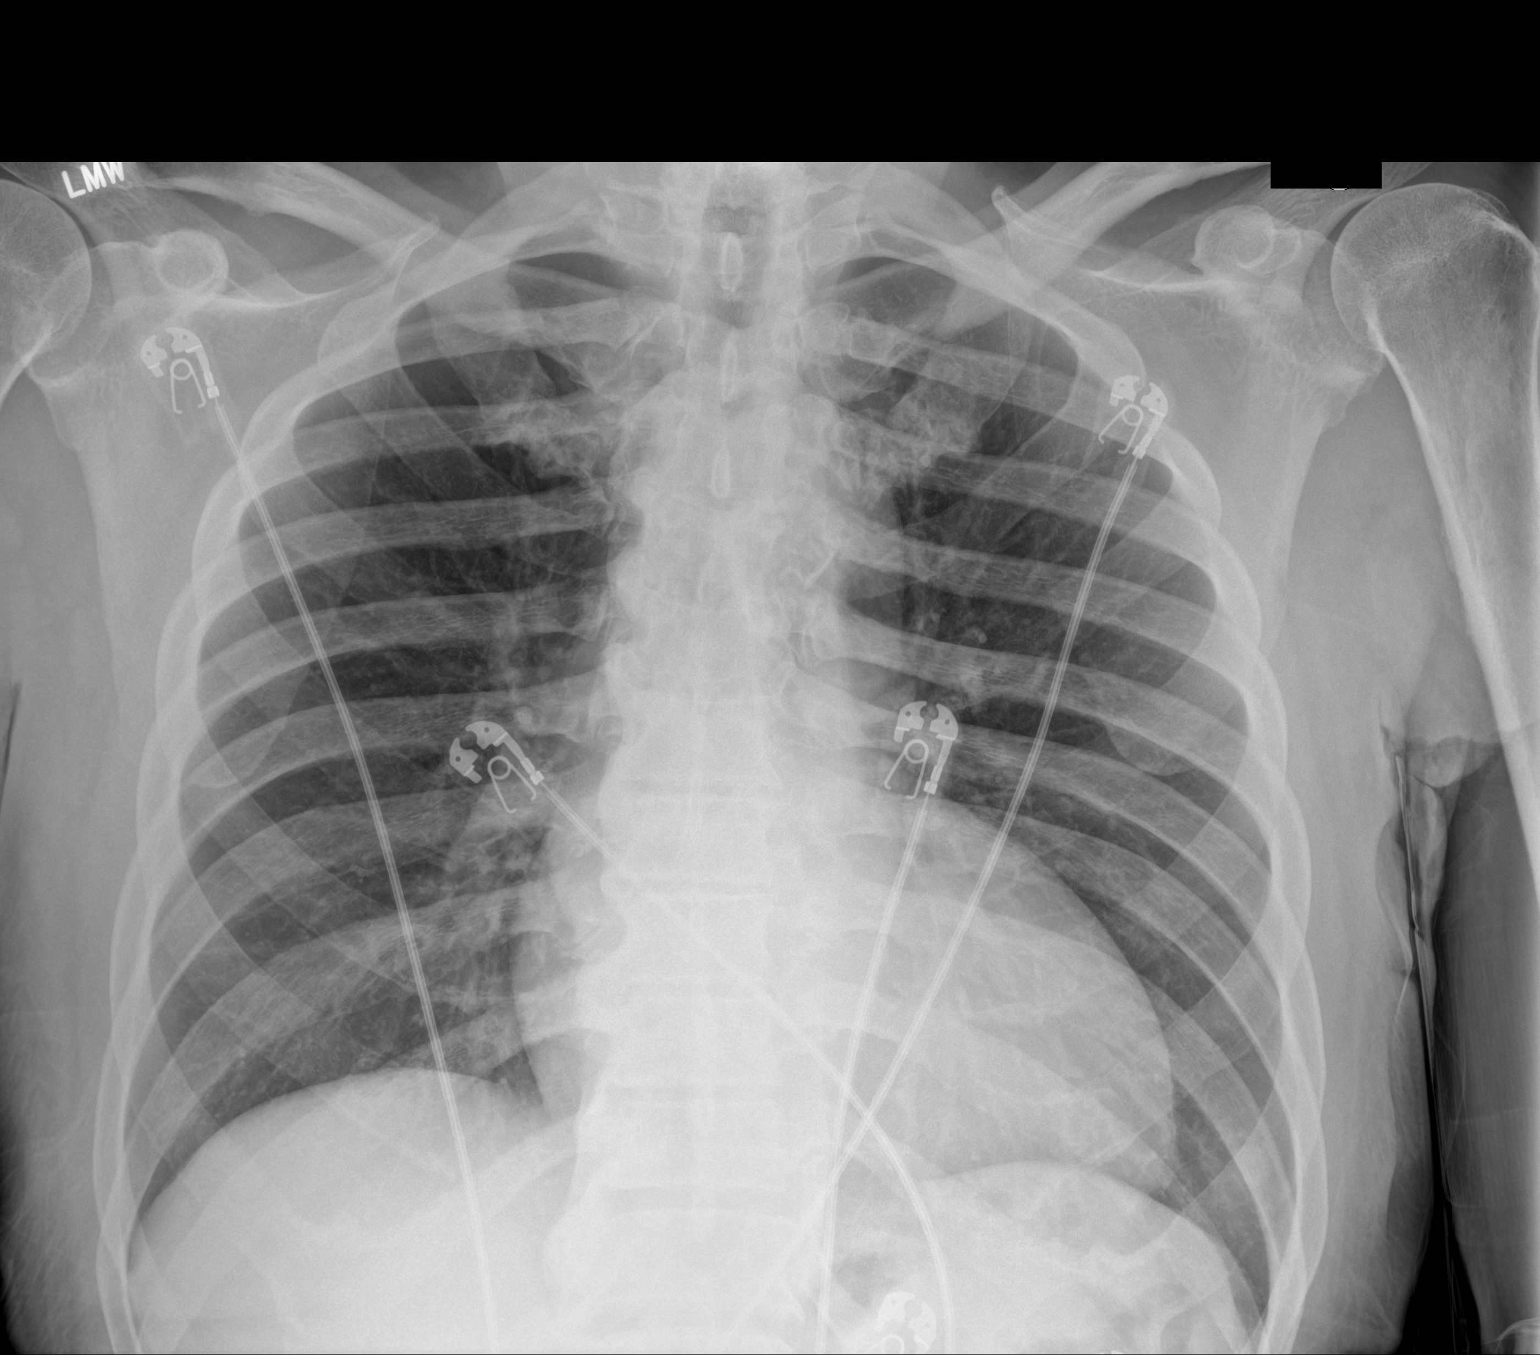

[1 of 1 positions shown; findings below may reference images not displayed]

FINDINGS: Lungs are clear.  No pleural effusion or pneumothorax.

The heart is normal in size.
IMPRESSION: No evidence of acute cardiopulmonary disease.

## 2024-01-30 IMAGING — CT CT ABD-PELV W/O CM
2 of 7 series · 14 of 46 positions shown, 18 images · non-contrast
Comparison: None

CLINICAL DATA: Nausea and vomiting



[Series 2: routine abd/pel wo · axial · 0.72mm/px · z∈[-797,-392]mm · 11 of 96 slices shown, 15 images]
[im 10/96  soft-tissue]
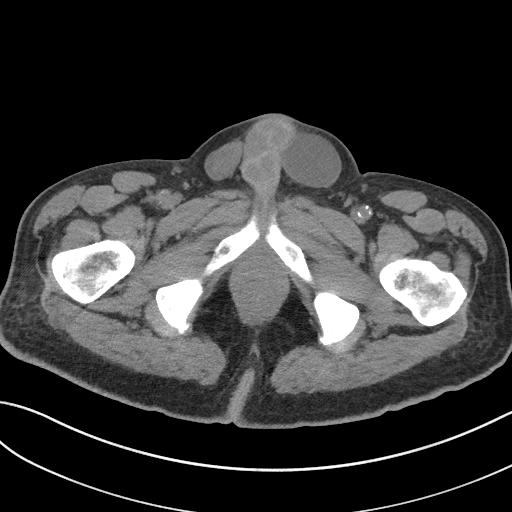
[im 10/96  bone]
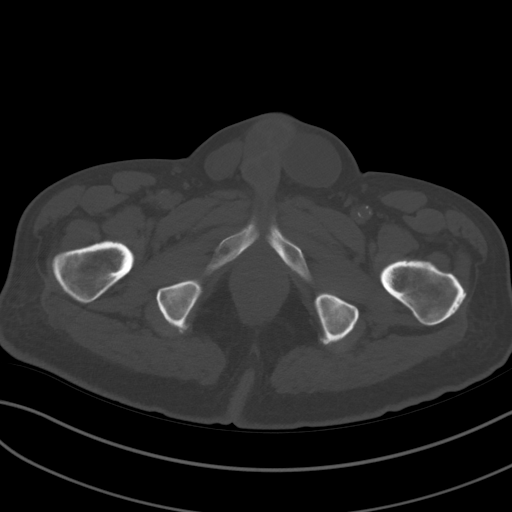
[im 20/96  soft-tissue]
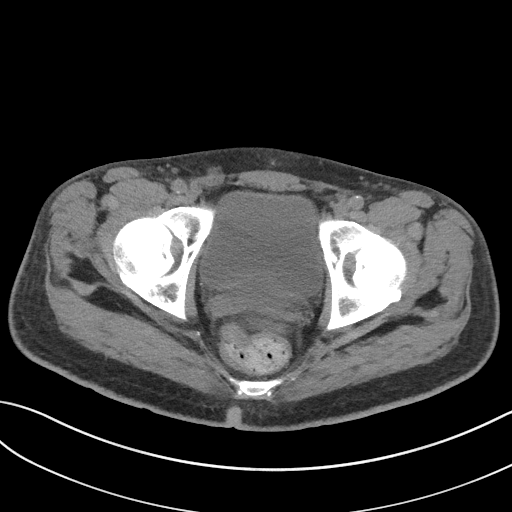
[im 29/96  soft-tissue]
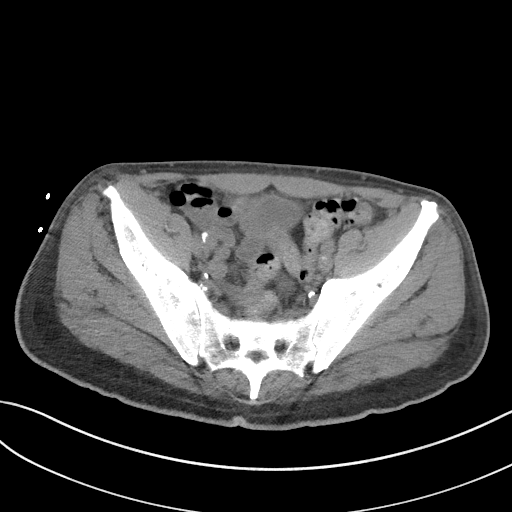
[im 39/96  soft-tissue]
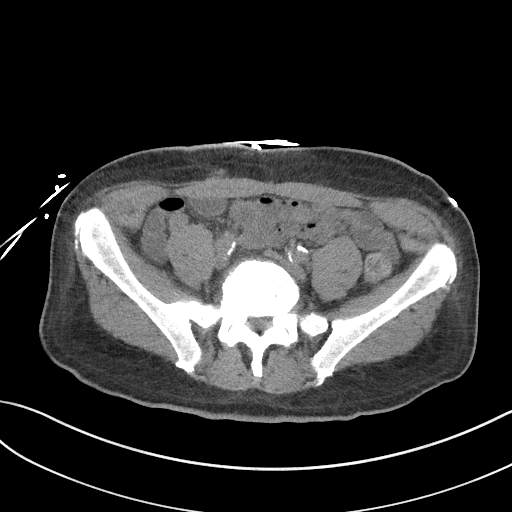
[im 48/96  soft-tissue]
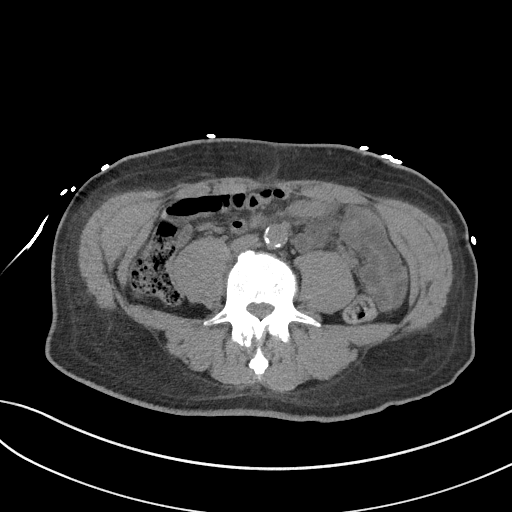
[im 58/96  soft-tissue]
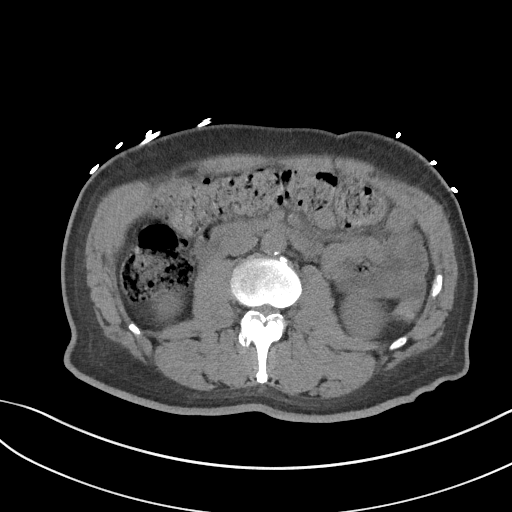
[im 67/96  soft-tissue]
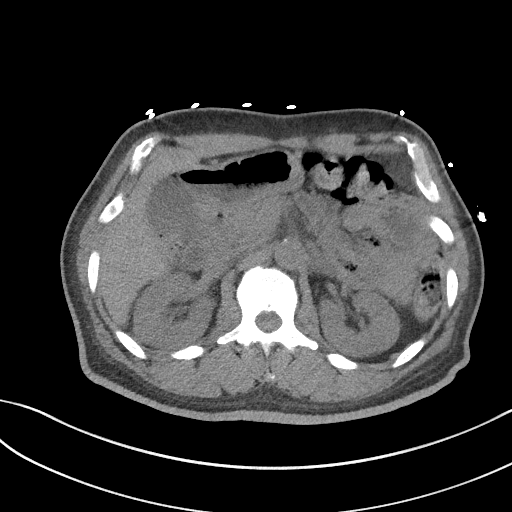
[im 77/96  soft-tissue]
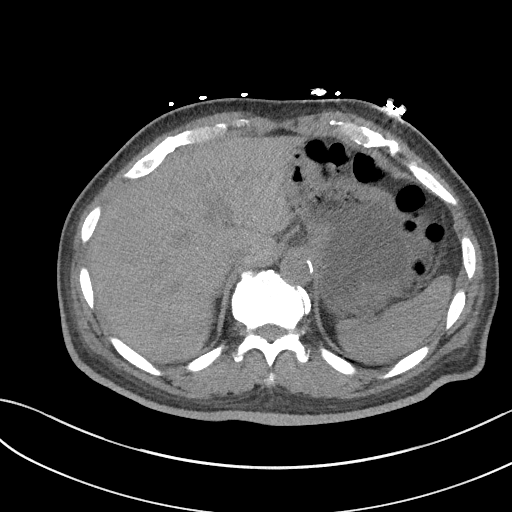
[im 77/96  lung]
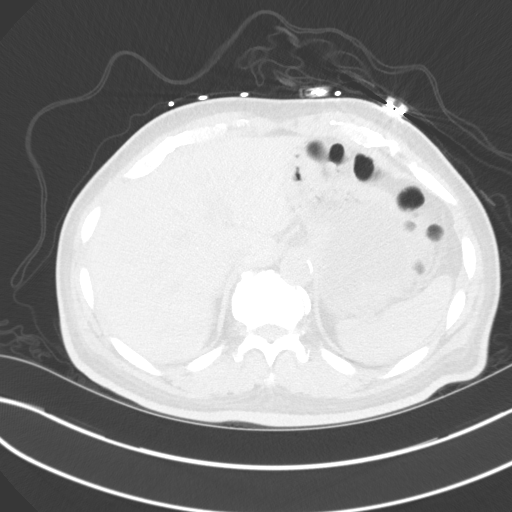
[im 81/96  lung]
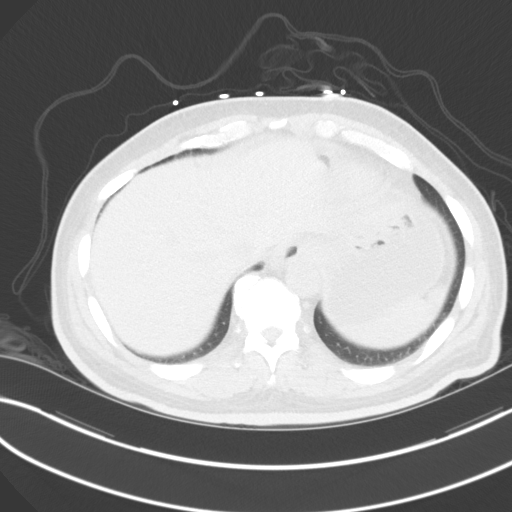
[im 86/96  soft-tissue]
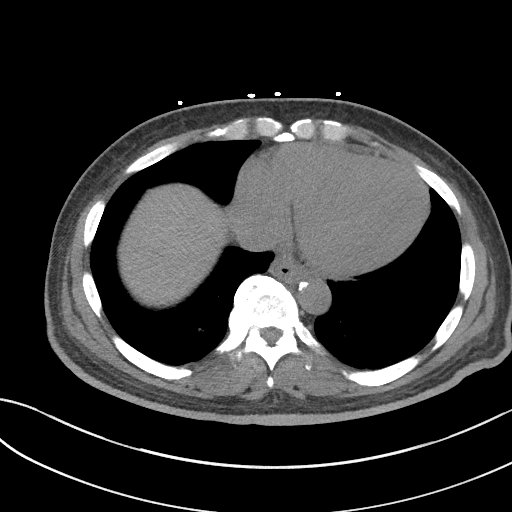
[im 86/96  lung]
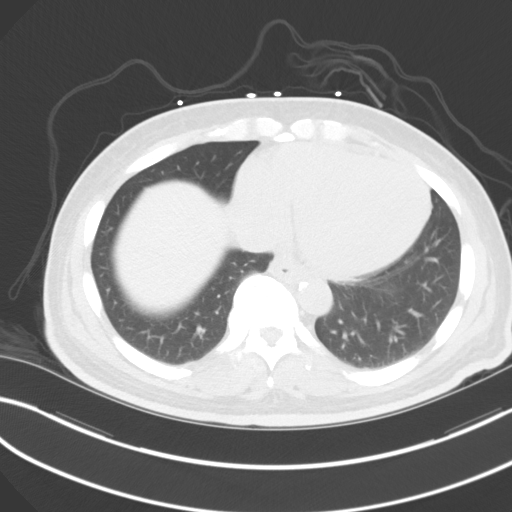
[im 86/96  bone]
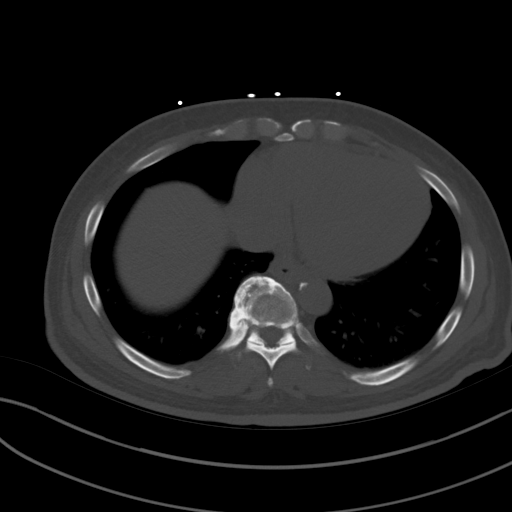
[im 91/96  lung]
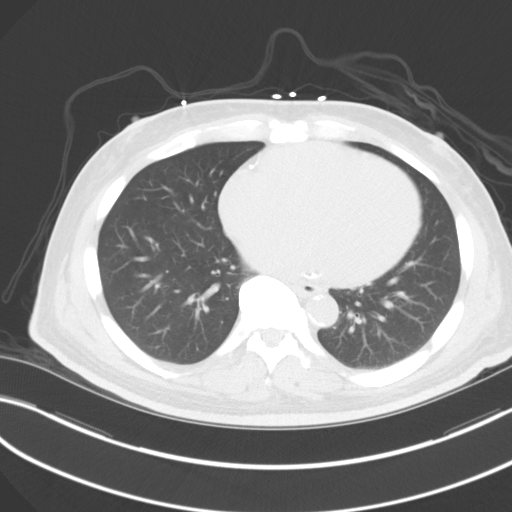

[Series 6: coronal st · coronal · 0.68mm/px · 3 of 83 slices shown]
[im 21/83  soft-tissue]
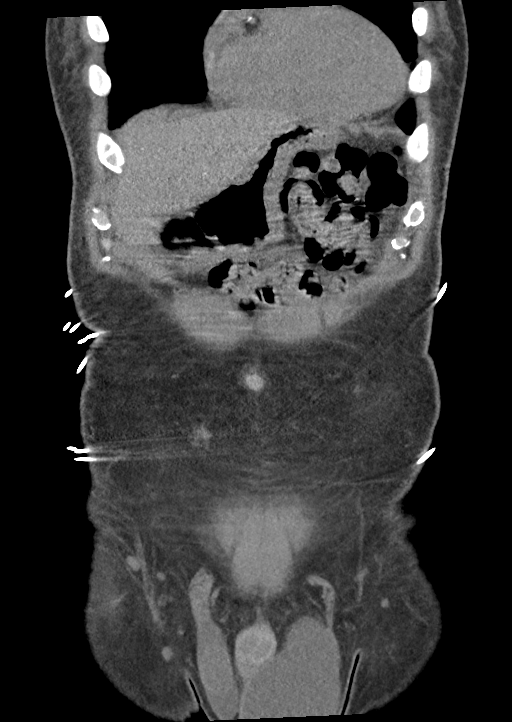
[im 42/83  soft-tissue]
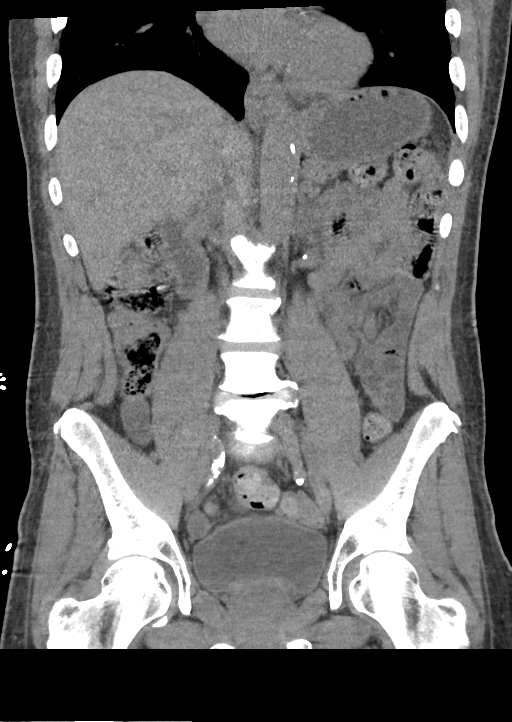
[im 62/83  soft-tissue]
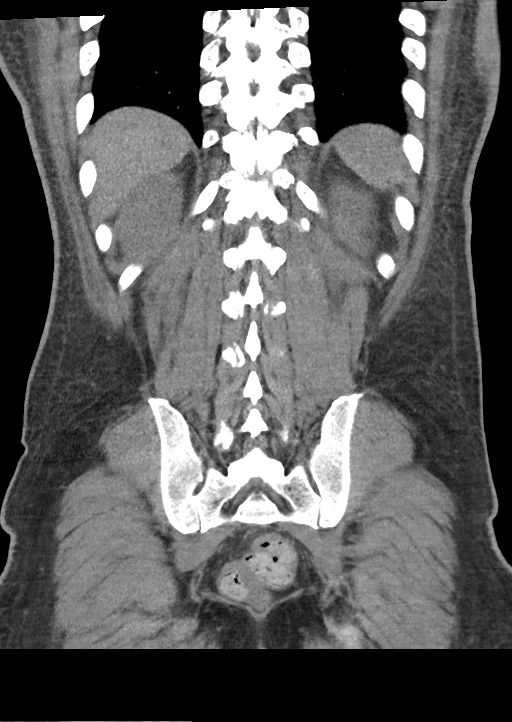

[14 of 46 positions shown; findings below may reference images not displayed]

FINDINGS: Lower chest: No acute abnormality.

Hepatobiliary: No focal liver abnormality is seen. No gallstones,
gallbladder wall thickening, or biliary dilatation.

Pancreas: Unremarkable. No pancreatic ductal dilatation or
surrounding inflammatory changes.

Spleen: Normal in size without focal abnormality.

Adrenals/Urinary Tract: Mild hyperplasia of the adrenal glands is
noted. The kidneys are well visualize without renal calculi or
urinary tract obstructive changes. The bladder is well distended.

Stomach/Bowel: Scattered fecal material is noted throughout the
colon. No obstructive changes are seen. The appendix is not well
appreciated. No inflammatory changes to suggest appendicitis are
noted. Small bowel and stomach are within normal limits.

Vascular/Lymphatic: Aortic atherosclerosis. No enlarged abdominal or
pelvic lymph nodes.

Reproductive: Prostate is unremarkable. Large left hydrocele is
noted.

Other: No abdominal wall hernia or abnormality. No abdominopelvic
ascites.

Musculoskeletal: Bony structures show no acute abnormality.
Degenerative changes of lumbar spine are noted.
IMPRESSION: No acute abnormality to correspond with the given clinical history.

Large left hydrocele.

## 2024-03-11 ENCOUNTER — Ambulatory Visit (INDEPENDENT_AMBULATORY_CARE_PROVIDER_SITE_OTHER): Admitting: Podiatry

## 2024-03-11 DIAGNOSIS — Z91199 Patient's noncompliance with other medical treatment and regimen due to unspecified reason: Secondary | ICD-10-CM

## 2024-03-11 NOTE — Progress Notes (Signed)
   Complete physical exam  Patient: Randall [name]   DOB: 08/26/1999   63 y.o. Male  MRN: [id number]  Subjective:    No chief complaint on file.   Randall [name] is a 63 y.o. male who presents today for a complete physical exam. She reports consuming a {diet types:17450} diet. {types:19826} She generally feels {DESC; WELL/FAIRLY WELL/POORLY:18703}. She reports sleeping {DESC; WELL/FAIRLY WELL/POORLY:18703}. She {does/does not:200015} have additional problems to discuss today.    Most recent fall risk assessment:    05/03/2022   10:42 AM  Fall Risk   Falls in the past year? 0  Number falls in past yr: 0  Injury with Fall? 0  Risk for fall due to : No Fall Risks  Follow up Falls evaluation completed     Most recent depression screenings:    05/03/2022   10:42 AM 03/24/2021   10:46 AM  PHQ 2/9 Scores  PHQ - 2 Score 0 0  PHQ- 9 Score 5     {VISON DENTAL STD PSA (Optional):27386}  {History (Optional):23778}  Patient Care Team: Christen Butter, NP as PCP - General (Nurse Practitioner)   Outpatient Medications Prior to Visit  Medication Sig   fluticasone (FLONASE) 50 MCG/ACT nasal spray Place 2 sprays into both nostrils in the morning and at bedtime. After 7 days, reduce to once daily.   norgestimate-ethinyl estradiol (SPRINTEC 28) 0.25-35 MG-MCG tablet Take 1 tablet by mouth daily.   Nystatin POWD Apply liberally to affected area 2 times per day   spironolactone (ALDACTONE) 100 MG tablet Take 1 tablet (100 mg total) by mouth daily.   No facility-administered medications prior to visit.    ROS        Objective:     There were no vitals taken for this visit. {Vitals History (Optional):23777}  Physical Exam   No results found for any visits on 06/08/22. {Show previous labs (optional):23779}    Assessment & Plan:    Routine Health Maintenance and Physical Exam  Immunization History  Administered Date(s) Administered   DTaP 11/09/1999, 01/05/2000,  03/15/2000, 11/29/2000, 06/14/2004   Hepatitis A 04/10/2008, 04/16/2009   Hepatitis B 08/27/1999, 10/04/1999, 03/15/2000   HiB (PRP-OMP) 11/09/1999, 01/05/2000, 03/15/2000, 11/29/2000   IPV 11/09/1999, 01/05/2000, 09/03/2000, 06/14/2004   Influenza,inj,Quad PF,6+ Mos 07/17/2014   Influenza-Unspecified 10/16/2012   MMR 09/03/2001, 06/14/2004   Meningococcal Polysaccharide 04/15/2012   Pneumococcal Conjugate-13 11/29/2000   Pneumococcal-Unspecified 03/15/2000, 05/29/2000   Tdap 04/15/2012   Varicella 09/03/2000, 04/10/2008    Health Maintenance  Topic Date Due   HIV Screening  Never done   Hepatitis C Screening  Never done   INFLUENZA VACCINE  06/06/2022   PAP-Cervical Cytology Screening  06/08/2022 (Originally 08/25/2020)   PAP SMEAR-Modifier  06/08/2022 (Originally 08/25/2020)   TETANUS/TDAP  06/08/2022 (Originally 04/15/2022)   HPV VACCINES  Discontinued   COVID-19 Vaccine  Discontinued    Discussed health benefits of physical activity, and encouraged her to engage in regular exercise appropriate for her age and condition.  Problem List Items Addressed This Visit   None Visit Diagnoses     Annual physical exam    -  Primary   Cervical cancer screening       Need for Tdap vaccination          No follow-ups on file.     Christen Butter, NP

## 2024-03-18 ENCOUNTER — Encounter: Payer: Self-pay | Admitting: Podiatry

## 2024-03-18 ENCOUNTER — Ambulatory Visit (INDEPENDENT_AMBULATORY_CARE_PROVIDER_SITE_OTHER): Admitting: Podiatry

## 2024-03-18 DIAGNOSIS — M79675 Pain in left toe(s): Secondary | ICD-10-CM

## 2024-03-18 DIAGNOSIS — M79674 Pain in right toe(s): Secondary | ICD-10-CM | POA: Diagnosis not present

## 2024-03-18 DIAGNOSIS — B351 Tinea unguium: Secondary | ICD-10-CM

## 2024-03-18 DIAGNOSIS — E119 Type 2 diabetes mellitus without complications: Secondary | ICD-10-CM | POA: Diagnosis not present

## 2024-03-18 NOTE — Progress Notes (Addendum)
   Chief Complaint  Patient presents with   Diabetes    "Cut his toenails off."    SUBJECTIVE Patient presents to office today complaining of elongated, thickened nails that cause pain while ambulating in shoes.  Patient is unable to trim their own nails. Patient is here for further evaluation and treatment.  Past Medical History:  Diagnosis Date   Anemia    Anxiety    Arthritis    CHF (congestive heart failure) (HCC)    Chronic kidney disease    Dementia (HCC)    Depression    Diabetes mellitus without complication (HCC)    GERD (gastroesophageal reflux disease)    Heart murmur    Hypertension    Hypertensive urgency 12/09/2021   Pneumonia    Stroke [facility])     OBJECTIVE General Patient is awake, alert, and oriented x 3 and in no acute distress. Derm Skin is dry and supple bilateral. Negative open lesions or macerations. Remaining integument unremarkable. Nails are tender, long, thickened and dystrophic with subungual debris, consistent with onychomycosis, 1-5 bilateral. No signs of infection noted. Vasc skin cool to touch.  Diminished pulses noted. Neuro light touch and protective threshold grossly intact Musculoskeletal Exam No symptomatic pedal deformities noted bilateral. Muscular strength within normal limits.  ASSESSMENT 1.  Pain due to onychomycosis of toenails both 2.  Encounter for diabetic foot exam  PLAN OF CARE 1. Patient evaluated today.  Comprehensive diabetic foot exam performed today 2. Instructed to maintain good pedal hygiene and foot care.  3. Mechanical debridement of nails 1-5 bilaterally performed using a nail nipper. Filed with dremel without incident.  4.  There is some concern for possible peripheral vascular disease secondary to the lower extremities, delayed capillary refill, and diminished pulses however the patient is completely asymptomatic and there are no open wounds.  Will continue to observe for now.   5.  Return to clinic in 3 mos.     Dot Gazella, DPM [facility]  Dr. Dot Gazella, DPM    [address] [place], [place] [address]                Office [phone]  Fax [phone]

## 2024-05-05 ENCOUNTER — Other Ambulatory Visit (INDEPENDENT_AMBULATORY_CARE_PROVIDER_SITE_OTHER): Payer: Self-pay | Admitting: Nurse Practitioner

## 2024-05-05 DIAGNOSIS — N186 End stage renal disease: Secondary | ICD-10-CM

## 2024-05-06 ENCOUNTER — Other Ambulatory Visit (INDEPENDENT_AMBULATORY_CARE_PROVIDER_SITE_OTHER)

## 2024-05-06 DIAGNOSIS — N186 End stage renal disease: Secondary | ICD-10-CM

## 2024-05-20 ENCOUNTER — Telehealth (INDEPENDENT_AMBULATORY_CARE_PROVIDER_SITE_OTHER): Payer: Self-pay

## 2024-05-20 ENCOUNTER — Encounter (INDEPENDENT_AMBULATORY_CARE_PROVIDER_SITE_OTHER): Payer: Self-pay | Admitting: Nurse Practitioner

## 2024-05-20 ENCOUNTER — Ambulatory Visit (INDEPENDENT_AMBULATORY_CARE_PROVIDER_SITE_OTHER): Admitting: Nurse Practitioner

## 2024-05-20 VITALS — BP 184/93 | HR 60 | Resp 16 | Ht 69.0 in | Wt 193.2 lb

## 2024-05-20 DIAGNOSIS — N186 End stage renal disease: Secondary | ICD-10-CM

## 2024-05-20 DIAGNOSIS — E785 Hyperlipidemia, unspecified: Secondary | ICD-10-CM | POA: Diagnosis not present

## 2024-05-20 DIAGNOSIS — I10 Essential (primary) hypertension: Secondary | ICD-10-CM | POA: Diagnosis not present

## 2024-05-20 NOTE — H&P (View-Only) (Signed)
 Subjective:    Patient ID: HEYWARD DOUTHIT, male    DOB: February 23, 1961, 63 y.o.   MRN: [id number] Chief Complaint  Patient presents with   Follow-up    LS 08-08-22 HDA consult prolonged bleeding post dialysis ref. Lateef    The patient returns to the office for follow up regarding a problem with their dialysis access.   The patient notes a significant increase in problems with dialysis. He is having significant bleeding post dialysis    The patient denies hand pain or other symptoms consistent with steal phenomena.  No significant arm swelling.  The patient denies redness or swelling at the access site. The patient denies fever or chills at home or while on dialysis.  No recent shortening of the patient's walking distance or new symptoms consistent with claudication.  No history of rest pain symptoms. No new ulcers or wounds of the lower extremities have occurred.  The patient denies amaurosis fugax or recent TIA symptoms. There are no recent neurological changes noted. There is no history of DVT, PE or superficial thrombophlebitis. No recent episodes of angina or shortness of breath documented.   Duplex ultrasound of the AV access shows a patent access.  There is a noted stenosis at the shoulder level.  Flow volume today is 1257 cc/min (previous flow volume was 1737 cc/min).      Review of Systems  Neurological:  Positive for weakness.  All other systems reviewed and are negative.      Objective:   Physical Exam Vitals reviewed.  HENT:     Head: Normocephalic.  Cardiovascular:     Rate and Rhythm: Normal rate.     Pulses:          Radial pulses are 2+ on the left side.     Arteriovenous access: Left arteriovenous access is present.     Comments: Pulsatile thrill  Pulmonary:     Effort: Pulmonary effort is normal.  Skin:    General: Skin is warm and dry.  Neurological:     Mental Status: He is alert and oriented to person, place, and time.  Psychiatric:        Mood  and Affect: Mood normal.        Behavior: Behavior normal.        Thought Content: Thought content normal.        Judgment: Judgment normal.     BP (!) 184/93 (BP Location: Left Arm, Patient Position: Sitting, Cuff Size: Normal) Comment: Has not taken BP meds this AM  Pulse 60   Resp 16   Ht 5' 9 (1.753 m)   Wt 193 lb 3.2 oz (87.6 kg)   BMI 28.53 kg/m   Past Medical History:  Diagnosis Date   Anemia    Anxiety    Arthritis    CHF (congestive heart failure) (HCC)    Chronic kidney disease    Dementia (HCC)    Depression    Diabetes mellitus without complication (HCC)    GERD (gastroesophageal reflux disease)    Heart murmur    Hypertension    Hypertensive urgency 12/09/2021   Pneumonia    Stroke [facility])     Social History   Socioeconomic History   Marital status: Divorced    Spouse name: Not on file   Number of children: Not on file   Years of education: Not on file   Highest education level: Not on file  Occupational History   Not on file  Tobacco  Use   Smoking status: Former    Current packs/day: 0.25    Types: Cigars, Cigarettes   Smokeless tobacco: Not on file  Substance and Sexual Activity   Alcohol use: Not Currently    Alcohol/week: 1.0 standard drink of alcohol    Types: 1 Glasses of wine per week    Comment: rarely   Drug use: Never   Sexual activity: Not Currently  Other Topics Concern   Not on file  Social History Narrative   Jaycion, Treml (Sister) lives with sister   Social Drivers of Corporate investment banker Strain: Low Risk  (03/20/2024)   Received from [facility]   Overall Financial Resource Strain (CARDIA)    Difficulty of Paying Living Expenses: Not hard at all  Food Insecurity: No Food Insecurity (03/20/2024)   Received from [facility]   Hunger Vital Sign    Within the past 12 months, you worried that your food would run out before you got the money to buy more.: Never true    Within the past 12 months, the food you  bought just didn't last and you didn't have money to get more.: Never true  Transportation Needs: No Transportation Needs (03/20/2024)   Received from [facility]   PRAPARE - Transportation    Lack of Transportation (Medical): No    Lack of Transportation (Non-Medical): No  Physical Activity: Inactive (03/20/2024)   Received from [facility]   Exercise Vital Sign    On average, how many days per week do you engage in moderate to strenuous exercise (like a brisk walk)?: 0 days    On average, how many minutes do you engage in exercise at this level?: 0 min  Stress: No Stress Concern Present (03/20/2024)   Received from [employer] [facility] - Occupational Stress Questionnaire    Feeling of Stress : Only a little  Social Connections: Moderately Integrated (08/18/2021)   Received from [facility]   Social Connection and Isolation Panel    In a typical week, how many times do you talk on the phone with family, friends, or neighbors?: More than three times a week    How often do you get together with friends or relatives?: Once a week    How often do you attend church or religious services?: 1 to 4 times per year    Do you belong to any clubs or organizations such as church groups, unions, fraternal or athletic groups, or school groups?: Yes    How often do you attend meetings of the clubs or organizations you belong to?: Never    Are you married, widowed, divorced, separated, never married, or living with a partner?: Separated  Intimate Partner Violence: Not At Risk (03/20/2024)   Received from [facility]   Humiliation, Afraid, Rape, and Kick questionnaire    Within the last year, have you been afraid of your partner or ex-partner?: No    Within the last year, have you been humiliated or emotionally abused in other ways by your partner or ex-partner?: No    Within the last year, have you been kicked, hit, slapped, or otherwise physically hurt  by your partner or ex-partner?: No    Within the last year, have you been raped or forced to have any kind of sexual activity by your partner or ex-partner?: No    Past Surgical History:  Procedure Laterality Date   AV FISTULA PLACEMENT  Right 06/15/2022   Procedure: ARTERIOVENOUS (AV) FISTULA CREATION (BRACHIALCEPHALIC);  Surgeon: Marea Selinda RAMAN, MD;  Location: [facility] ORS;  Service: Vascular;  Laterality: Right;   COLONOSCOPY W/ POLYPECTOMY     dental work     DIALYSIS/PERMA CATHETER INSERTION N/A 12/30/2021   Procedure: DIALYSIS/PERMA CATHETER INSERTION;  Surgeon: Marea Selinda RAMAN, MD;  Location: [facility] INVASIVE CV LAB;  Service: Cardiovascular;  Laterality: N/A;   DIALYSIS/PERMA CATHETER REMOVAL N/A 10/19/2022   Procedure: DIALYSIS/PERMA CATHETER REMOVAL;  Surgeon: Marea Selinda RAMAN, MD;  Location: [facility] INVASIVE CV LAB;  Service: Cardiovascular;  Laterality: N/A;   ESOPHAGOGASTRODUODENOSCOPY (EGD) WITH PROPOFOL  N/A 12/26/2021   Procedure: ESOPHAGOGASTRODUODENOSCOPY (EGD) WITH PROPOFOL ;  Surgeon: Onita Elspeth Sharper, DO;  Location: [facility] ENDOSCOPY;  Service: Gastroenterology;  Laterality: N/A;   TEMPORARY DIALYSIS CATHETER N/A 12/27/2021   Procedure: TEMPORARY DIALYSIS CATHETER;  Surgeon: Jama Cordella MATSU, MD;  Location: [facility] INVASIVE CV LAB;  Service: Cardiovascular;  Laterality: N/A;   TONSILLECTOMY     adeniods removed    Family History  Problem Relation Age of Onset   Hypertension Mother    Dementia Mother    Hypertension Maternal Grandmother    Diabetes Maternal Grandmother    Stroke Maternal Grandmother    Heart attack Maternal Grandmother    Stroke Maternal Grandfather     Allergies  Allergen Reactions   Penicillins     Reaction in childhood - unknown reaction Patient states he has tolerated amoxicillin.          Latest Ref Rng & Units 07/08/2023    5:38 AM 07/07/2023    4:33 AM 07/06/2023    3:47 AM  CBC  WBC 4.0 - 10.5 K/uL 8.6  7.5  7.1   Hemoglobin 13.0 - 17.0 g/dL 89.4   89.4  89.8   Hematocrit 39.0 - 52.0 % 30.2  30.8  30.0   Platelets 150 - 400 K/uL 241  222  211       CMP     Component Value Date/Time   NA 133 (L) 07/08/2023 0538   K 3.4 (L) 07/08/2023 0538   CL 94 (L) 07/08/2023 0538   CO2 28 07/08/2023 0538   GLUCOSE 83 07/08/2023 0538   BUN 34 (H) 07/08/2023 0538   CREATININE 5.49 (H) 07/08/2023 0538   CALCIUM  8.5 (L) 07/08/2023 0538   PROT 6.0 (L) 06/30/2023 1008   ALBUMIN 2.8 (L) 07/02/2023 0717   AST 18 06/30/2023 1008   ALT 9 06/30/2023 1008   ALKPHOS 57 06/30/2023 1008   BILITOT 0.5 06/30/2023 1008   GFRNONAA 11 (L) 07/08/2023 0538     No results found.     Assessment & Plan:   1. ESRD (end stage renal disease) (HCC) (Primary) Recommend:  The patient is experiencing increasing problems with their dialysis access.  Patient should have a fistulagram with the intention for intervention.  The intention for intervention is to restore appropriate flow and prevent thrombosis and possible loss of the access.  As well as improve the quality of dialysis therapy.  The risks, benefits and alternative therapies were reviewed in detail with the patient.  All questions were answered.  The patient agrees to proceed with angio/intervention.    The patient will follow up with me in the office after the procedure.   2. Hyperlipidemia, unspecified hyperlipidemia type Continue statin as ordered and reviewed, no changes at this time  3. Essential hypertension Continue antihypertensive medications as already ordered, these medications have been reviewed and there are  no changes at this time.   Current Outpatient Medications on File Prior to Visit  Medication Sig Dispense Refill   aspirin  81 MG EC tablet Take 81 mg by mouth daily.     carvedilol  (COREG ) 6.25 MG tablet Take 6.25 mg by mouth 2 (two) times daily with a meal.     lansoprazole (PREVACID) 30 MG capsule Take 30 mg by mouth daily.     lidocaine -prilocaine  (EMLA ) cream Use 3 times  weekly before dialysis.     rosuvastatin  (CRESTOR ) 10 MG tablet Take 10 mg by mouth daily.     sertraline  (ZOLOFT ) 100 MG tablet Take 100 mg by mouth daily.     tamsulosin  (FLOMAX ) 0.4 MG CAPS capsule Take 0.4 mg by mouth at bedtime.     No current facility-administered medications on file prior to visit.    There are no Patient Instructions on file for this visit. No follow-ups on file.   Desarai Barrack E Zuha Dejonge, NP

## 2024-05-20 NOTE — Progress Notes (Signed)
 Subjective:    Patient ID: HEYWARD DOUTHIT, male    DOB: February 23, 1961, 63 y.o.   MRN: [id number] Chief Complaint  Patient presents with   Follow-up    LS 08-08-22 HDA consult prolonged bleeding post dialysis ref. Lateef    The patient returns to the office for follow up regarding a problem with their dialysis access.   The patient notes a significant increase in problems with dialysis. He is having significant bleeding post dialysis    The patient denies hand pain or other symptoms consistent with steal phenomena.  No significant arm swelling.  The patient denies redness or swelling at the access site. The patient denies fever or chills at home or while on dialysis.  No recent shortening of the patient's walking distance or new symptoms consistent with claudication.  No history of rest pain symptoms. No new ulcers or wounds of the lower extremities have occurred.  The patient denies amaurosis fugax or recent TIA symptoms. There are no recent neurological changes noted. There is no history of DVT, PE or superficial thrombophlebitis. No recent episodes of angina or shortness of breath documented.   Duplex ultrasound of the AV access shows a patent access.  There is a noted stenosis at the shoulder level.  Flow volume today is 1257 cc/min (previous flow volume was 1737 cc/min).      Review of Systems  Neurological:  Positive for weakness.  All other systems reviewed and are negative.      Objective:   Physical Exam Vitals reviewed.  HENT:     Head: Normocephalic.  Cardiovascular:     Rate and Rhythm: Normal rate.     Pulses:          Radial pulses are 2+ on the left side.     Arteriovenous access: Left arteriovenous access is present.     Comments: Pulsatile thrill  Pulmonary:     Effort: Pulmonary effort is normal.  Skin:    General: Skin is warm and dry.  Neurological:     Mental Status: He is alert and oriented to person, place, and time.  Psychiatric:        Mood  and Affect: Mood normal.        Behavior: Behavior normal.        Thought Content: Thought content normal.        Judgment: Judgment normal.     BP (!) 184/93 (BP Location: Left Arm, Patient Position: Sitting, Cuff Size: Normal) Comment: Has not taken BP meds this AM  Pulse 60   Resp 16   Ht 5' 9 (1.753 m)   Wt 193 lb 3.2 oz (87.6 kg)   BMI 28.53 kg/m   Past Medical History:  Diagnosis Date   Anemia    Anxiety    Arthritis    CHF (congestive heart failure) (HCC)    Chronic kidney disease    Dementia (HCC)    Depression    Diabetes mellitus without complication (HCC)    GERD (gastroesophageal reflux disease)    Heart murmur    Hypertension    Hypertensive urgency 12/09/2021   Pneumonia    Stroke [facility])     Social History   Socioeconomic History   Marital status: Divorced    Spouse name: Not on file   Number of children: Not on file   Years of education: Not on file   Highest education level: Not on file  Occupational History   Not on file  Tobacco  Use   Smoking status: Former    Current packs/day: 0.25    Types: Cigars, Cigarettes   Smokeless tobacco: Not on file  Substance and Sexual Activity   Alcohol use: Not Currently    Alcohol/week: 1.0 standard drink of alcohol    Types: 1 Glasses of wine per week    Comment: rarely   Drug use: Never   Sexual activity: Not Currently  Other Topics Concern   Not on file  Social History Narrative   Jaycion, Treml (Sister) lives with sister   Social Drivers of Corporate investment banker Strain: Low Risk  (03/20/2024)   Received from [facility]   Overall Financial Resource Strain (CARDIA)    Difficulty of Paying Living Expenses: Not hard at all  Food Insecurity: No Food Insecurity (03/20/2024)   Received from [facility]   Hunger Vital Sign    Within the past 12 months, you worried that your food would run out before you got the money to buy more.: Never true    Within the past 12 months, the food you  bought just didn't last and you didn't have money to get more.: Never true  Transportation Needs: No Transportation Needs (03/20/2024)   Received from [facility]   PRAPARE - Transportation    Lack of Transportation (Medical): No    Lack of Transportation (Non-Medical): No  Physical Activity: Inactive (03/20/2024)   Received from [facility]   Exercise Vital Sign    On average, how many days per week do you engage in moderate to strenuous exercise (like a brisk walk)?: 0 days    On average, how many minutes do you engage in exercise at this level?: 0 min  Stress: No Stress Concern Present (03/20/2024)   Received from [employer] [facility] - Occupational Stress Questionnaire    Feeling of Stress : Only a little  Social Connections: Moderately Integrated (08/18/2021)   Received from [facility]   Social Connection and Isolation Panel    In a typical week, how many times do you talk on the phone with family, friends, or neighbors?: More than three times a week    How often do you get together with friends or relatives?: Once a week    How often do you attend church or religious services?: 1 to 4 times per year    Do you belong to any clubs or organizations such as church groups, unions, fraternal or athletic groups, or school groups?: Yes    How often do you attend meetings of the clubs or organizations you belong to?: Never    Are you married, widowed, divorced, separated, never married, or living with a partner?: Separated  Intimate Partner Violence: Not At Risk (03/20/2024)   Received from [facility]   Humiliation, Afraid, Rape, and Kick questionnaire    Within the last year, have you been afraid of your partner or ex-partner?: No    Within the last year, have you been humiliated or emotionally abused in other ways by your partner or ex-partner?: No    Within the last year, have you been kicked, hit, slapped, or otherwise physically hurt  by your partner or ex-partner?: No    Within the last year, have you been raped or forced to have any kind of sexual activity by your partner or ex-partner?: No    Past Surgical History:  Procedure Laterality Date   AV FISTULA PLACEMENT  Right 06/15/2022   Procedure: ARTERIOVENOUS (AV) FISTULA CREATION (BRACHIALCEPHALIC);  Surgeon: Marea Selinda RAMAN, MD;  Location: [facility] ORS;  Service: Vascular;  Laterality: Right;   COLONOSCOPY W/ POLYPECTOMY     dental work     DIALYSIS/PERMA CATHETER INSERTION N/A 12/30/2021   Procedure: DIALYSIS/PERMA CATHETER INSERTION;  Surgeon: Marea Selinda RAMAN, MD;  Location: [facility] INVASIVE CV LAB;  Service: Cardiovascular;  Laterality: N/A;   DIALYSIS/PERMA CATHETER REMOVAL N/A 10/19/2022   Procedure: DIALYSIS/PERMA CATHETER REMOVAL;  Surgeon: Marea Selinda RAMAN, MD;  Location: [facility] INVASIVE CV LAB;  Service: Cardiovascular;  Laterality: N/A;   ESOPHAGOGASTRODUODENOSCOPY (EGD) WITH PROPOFOL  N/A 12/26/2021   Procedure: ESOPHAGOGASTRODUODENOSCOPY (EGD) WITH PROPOFOL ;  Surgeon: Onita Elspeth Sharper, DO;  Location: [facility] ENDOSCOPY;  Service: Gastroenterology;  Laterality: N/A;   TEMPORARY DIALYSIS CATHETER N/A 12/27/2021   Procedure: TEMPORARY DIALYSIS CATHETER;  Surgeon: Jama Cordella MATSU, MD;  Location: [facility] INVASIVE CV LAB;  Service: Cardiovascular;  Laterality: N/A;   TONSILLECTOMY     adeniods removed    Family History  Problem Relation Age of Onset   Hypertension Mother    Dementia Mother    Hypertension Maternal Grandmother    Diabetes Maternal Grandmother    Stroke Maternal Grandmother    Heart attack Maternal Grandmother    Stroke Maternal Grandfather     Allergies  Allergen Reactions   Penicillins     Reaction in childhood - unknown reaction Patient states he has tolerated amoxicillin.          Latest Ref Rng & Units 07/08/2023    5:38 AM 07/07/2023    4:33 AM 07/06/2023    3:47 AM  CBC  WBC 4.0 - 10.5 K/uL 8.6  7.5  7.1   Hemoglobin 13.0 - 17.0 g/dL 89.4   89.4  89.8   Hematocrit 39.0 - 52.0 % 30.2  30.8  30.0   Platelets 150 - 400 K/uL 241  222  211       CMP     Component Value Date/Time   NA 133 (L) 07/08/2023 0538   K 3.4 (L) 07/08/2023 0538   CL 94 (L) 07/08/2023 0538   CO2 28 07/08/2023 0538   GLUCOSE 83 07/08/2023 0538   BUN 34 (H) 07/08/2023 0538   CREATININE 5.49 (H) 07/08/2023 0538   CALCIUM  8.5 (L) 07/08/2023 0538   PROT 6.0 (L) 06/30/2023 1008   ALBUMIN 2.8 (L) 07/02/2023 0717   AST 18 06/30/2023 1008   ALT 9 06/30/2023 1008   ALKPHOS 57 06/30/2023 1008   BILITOT 0.5 06/30/2023 1008   GFRNONAA 11 (L) 07/08/2023 0538     No results found.     Assessment & Plan:   1. ESRD (end stage renal disease) (HCC) (Primary) Recommend:  The patient is experiencing increasing problems with their dialysis access.  Patient should have a fistulagram with the intention for intervention.  The intention for intervention is to restore appropriate flow and prevent thrombosis and possible loss of the access.  As well as improve the quality of dialysis therapy.  The risks, benefits and alternative therapies were reviewed in detail with the patient.  All questions were answered.  The patient agrees to proceed with angio/intervention.    The patient will follow up with me in the office after the procedure.   2. Hyperlipidemia, unspecified hyperlipidemia type Continue statin as ordered and reviewed, no changes at this time  3. Essential hypertension Continue antihypertensive medications as already ordered, these medications have been reviewed and there are  no changes at this time.   Current Outpatient Medications on File Prior to Visit  Medication Sig Dispense Refill   aspirin  81 MG EC tablet Take 81 mg by mouth daily.     carvedilol  (COREG ) 6.25 MG tablet Take 6.25 mg by mouth 2 (two) times daily with a meal.     lansoprazole (PREVACID) 30 MG capsule Take 30 mg by mouth daily.     lidocaine -prilocaine  (EMLA ) cream Use 3 times  weekly before dialysis.     rosuvastatin  (CRESTOR ) 10 MG tablet Take 10 mg by mouth daily.     sertraline  (ZOLOFT ) 100 MG tablet Take 100 mg by mouth daily.     tamsulosin  (FLOMAX ) 0.4 MG CAPS capsule Take 0.4 mg by mouth at bedtime.     No current facility-administered medications on file prior to visit.    There are no Patient Instructions on file for this visit. No follow-ups on file.   Desarai Barrack E Zuha Dejonge, NP

## 2024-05-20 NOTE — Telephone Encounter (Signed)
 Spoke with the patient's sister and he is scheduled for a left arm fistulagram with Dr. Marea on 05/22/24 with a 11:00 am arrival time to the [facility]. Pre-procedure instructions were discussed and will be sent to Mychart.

## 2024-05-22 ENCOUNTER — Other Ambulatory Visit: Payer: Self-pay

## 2024-05-22 ENCOUNTER — Encounter: Payer: Self-pay | Admitting: Vascular Surgery

## 2024-05-22 ENCOUNTER — Ambulatory Visit
Admission: RE | Admit: 2024-05-22 | Discharge: 2024-05-22 | Disposition: A | Discharge status: Home / Self Care | Attending: Vascular Surgery | Admitting: Vascular Surgery

## 2024-05-22 ENCOUNTER — Encounter
Admission: RE | Disposition: A | Discharge status: Home / Self Care | Payer: Self-pay | Source: Home / Self Care | Attending: Vascular Surgery

## 2024-05-22 DIAGNOSIS — Z79899 Other long term (current) drug therapy: Secondary | ICD-10-CM | POA: Insufficient documentation

## 2024-05-22 DIAGNOSIS — E1122 Type 2 diabetes mellitus with diabetic chronic kidney disease: Secondary | ICD-10-CM | POA: Insufficient documentation

## 2024-05-22 DIAGNOSIS — I132 Hypertensive heart and chronic kidney disease with heart failure and with stage 5 chronic kidney disease, or end stage renal disease: Secondary | ICD-10-CM | POA: Diagnosis not present

## 2024-05-22 DIAGNOSIS — N186 End stage renal disease: Secondary | ICD-10-CM | POA: Insufficient documentation

## 2024-05-22 DIAGNOSIS — Z992 Dependence on renal dialysis: Secondary | ICD-10-CM | POA: Insufficient documentation

## 2024-05-22 DIAGNOSIS — I509 Heart failure, unspecified: Secondary | ICD-10-CM | POA: Insufficient documentation

## 2024-05-22 DIAGNOSIS — T82858A Stenosis of vascular prosthetic devices, implants and grafts, initial encounter: Secondary | ICD-10-CM | POA: Insufficient documentation

## 2024-05-22 DIAGNOSIS — Z87891 Personal history of nicotine dependence: Secondary | ICD-10-CM | POA: Diagnosis not present

## 2024-05-22 DIAGNOSIS — Y832 Surgical operation with anastomosis, bypass or graft as the cause of abnormal reaction of the patient, or of later complication, without mention of misadventure at the time of the procedure: Secondary | ICD-10-CM | POA: Diagnosis not present

## 2024-05-22 DIAGNOSIS — E785 Hyperlipidemia, unspecified: Secondary | ICD-10-CM | POA: Diagnosis not present

## 2024-05-22 HISTORY — PX: A/V FISTULAGRAM: CATH118298

## 2024-05-22 LAB — POTASSIUM (ARMC VASCULAR LAB ONLY): Potassium (ARMC vascular lab): 3.9 mmol/L (ref 3.5–5.1)

## 2024-05-22 SURGERY — A/V FISTULAGRAM
Anesthesia: Moderate Sedation | Laterality: Right

## 2024-05-22 MED ORDER — HEPARIN SODIUM (PORCINE) 1000 UNIT/ML IJ SOLN
INTRAMUSCULAR | Status: AC
  Filled 2024-05-22: qty 10

## 2024-05-22 MED ORDER — MIDAZOLAM HCL 2 MG/2ML IJ SOLN
INTRAMUSCULAR | Status: DC | PRN
  Administered 2024-05-22: 2 mg via INTRAVENOUS

## 2024-05-22 MED ORDER — SODIUM CHLORIDE 0.9 % IV SOLN: INTRAVENOUS | Status: DC

## 2024-05-22 MED ORDER — METHYLPREDNISOLONE SODIUM SUCC 125 MG IJ SOLR
125.0000 mg | Freq: Once | INTRAMUSCULAR | Status: DC | PRN
Start: 2024-05-22 — End: 2024-05-22

## 2024-05-22 MED ORDER — FENTANYL CITRATE (PF) 100 MCG/2ML IJ SOLN
INTRAMUSCULAR | Status: AC
  Filled 2024-05-22: qty 2

## 2024-05-22 MED ORDER — CEFAZOLIN SODIUM-DEXTROSE 2-4 GM/100ML-% IV SOLN: 2.0000 g | INTRAVENOUS | Status: DC

## 2024-05-22 MED ORDER — MIDAZOLAM HCL 2 MG/2ML IJ SOLN
INTRAMUSCULAR | Status: AC
  Filled 2024-05-22: qty 4

## 2024-05-22 MED ORDER — FENTANYL CITRATE (PF) 100 MCG/2ML IJ SOLN
INTRAMUSCULAR | Status: DC | PRN
  Administered 2024-05-22: 50 ug via INTRAVENOUS

## 2024-05-22 MED ORDER — FAMOTIDINE 20 MG PO TABS: 40.0000 mg | ORAL_TABLET | Freq: Once | ORAL | Status: DC | PRN

## 2024-05-22 MED ORDER — HEPARIN (PORCINE) IN NACL 1000-0.9 UT/500ML-% IV SOLN
INTRAVENOUS | Status: DC | PRN
  Administered 2024-05-22: 1000 mL

## 2024-05-22 MED ORDER — DIPHENHYDRAMINE HCL 50 MG/ML IJ SOLN: 50.0000 mg | Freq: Once | INTRAMUSCULAR | Status: DC | PRN

## 2024-05-22 MED ORDER — CEFAZOLIN SODIUM-DEXTROSE 1-4 GM/50ML-% IV SOLN
1.0000 g | INTRAVENOUS | Status: AC
  Administered 2024-05-22: 1 g via INTRAVENOUS

## 2024-05-22 MED ORDER — LIDOCAINE-EPINEPHRINE (PF) 1 %-1:200000 IJ SOLN
INTRAMUSCULAR | Status: DC | PRN
  Administered 2024-05-22: 10 mL

## 2024-05-22 MED ORDER — HEPARIN SODIUM (PORCINE) 1000 UNIT/ML IJ SOLN
INTRAMUSCULAR | Status: DC | PRN
  Administered 2024-05-22: 3000 [IU] via INTRAVENOUS

## 2024-05-22 MED ORDER — ONDANSETRON HCL 4 MG/2ML IJ SOLN: 4.0000 mg | Freq: Four times a day (QID) | INTRAMUSCULAR | Status: DC | PRN

## 2024-05-22 MED ORDER — HYDROMORPHONE HCL 1 MG/ML IJ SOLN: 1.0000 mg | Freq: Once | INTRAMUSCULAR | Status: DC | PRN

## 2024-05-22 MED ORDER — IODIXANOL 320 MG/ML IV SOLN
INTRAVENOUS | Status: DC | PRN
  Administered 2024-05-22: 25 mL

## 2024-05-22 MED ORDER — MIDAZOLAM HCL 2 MG/ML PO SYRP: 8.0000 mg | ORAL_SOLUTION | Freq: Once | ORAL | Status: DC | PRN

## 2024-05-22 SURGICAL SUPPLY — 11 items
BALLOON DORADO 7X60X80 (BALLOONS) IMPLANT
BALLOON LUTONIX DCB 7X60X130 (BALLOONS) IMPLANT
BALLOON ULTRVRSE 9X60X75 (BALLOONS) IMPLANT
COVER PROBE ULTRASOUND 5X96 (MISCELLANEOUS) IMPLANT
DEVICE PRESTO INFLATION (MISCELLANEOUS) IMPLANT
DRAPE BRACHIAL (DRAPES) IMPLANT
KIT MICROPUNCTURE VSI 5F STIFF (SHEATH) IMPLANT
PACK ANGIOGRAPHY (CUSTOM PROCEDURE TRAY) ×1 IMPLANT
SHEATH BRITE TIP 6FRX5.5 (SHEATH) IMPLANT
SUT MNCRL AB 4-0 PS2 18 (SUTURE) IMPLANT
WIRE SUPRACORE 190CM (WIRE) IMPLANT

## 2024-05-22 NOTE — Interval H&P Note (Signed)
 History and Physical Interval Note:  05/22/2024 11:40 AM  Randall [name]  has presented today for surgery, with the diagnosis of R Arm Fistulagram   End Stage Renal.  The various methods of treatment have been discussed with the patient and family. After consideration of risks, benefits and other options for treatment, the patient has consented to  Procedure(s): A/V Fistulagram (Right) as a surgical intervention.  The patient's history has been reviewed, patient examined, no change in status, stable for surgery.  I have reviewed the patient's chart and labs.  Questions were answered to the patient's satisfaction.     Greggory Safranek

## 2024-05-22 NOTE — Op Note (Signed)
 [facility]    OPERATIVE NOTE   PROCEDURE: 1.   Right brachiocephalic arteriovenous fistula cannulation under ultrasound guidance 2.   Right arm fistulagram including central venogram 3.   Left proximal forearm cephalic vein with 7 mm diameter drug coated and 7 mm diameter high pressure angioplasty balloon and 9 mm diameter conventional balloon  PRE-OPERATIVE DIAGNOSIS: 1. ESRD 2. Poorly functional right brachiocephalic AVF  POST-OPERATIVE DIAGNOSIS: same as above   SURGEON: Selinda Gu, MD  ANESTHESIA: local with MCS  ESTIMATED BLOOD LOSS: 5 cc  FINDING(S): 90% stenosis of the proximal upper arm cephalic vein.  The remainder of the AV fistula in the central venous circulation was widely patent.  SPECIMEN(S):  None  CONTRAST: 25 cc  FLUORO TIME: 2 minutes  MODERATE CONSCIOUS SEDATION TIME: Approximately 24 minutes with 2 mg of Versed  and 50 mcg of Fentanyl    INDICATIONS: Randall [name] is a 63 y.o. male who presents with malfunctioning right brachiocephalic arteriovenous fistula.  The patient is scheduled for right arm fistulagram.  The patient is aware the risks include but are not limited to: bleeding, infection, thrombosis of the cannulated access, and possible anaphylactic reaction to the contrast.  The patient is aware of the risks of the procedure and elects to proceed forward.  DESCRIPTION: After full informed written consent was obtained, the patient was brought back to the angiography suite and placed supine upon the angiography table.  The patient was connected to monitoring equipment. Moderate conscious sedation was administered with a face to face encounter with the patient throughout the procedure with my supervision of the RN administering medicines and monitoring the patient's vital signs and mental status throughout from the start of the procedure until the patient was taken to the recovery room. The right arm was prepped and draped in  the standard fashion for a percutaneous access intervention.  Under ultrasound guidance, the right brachiocephalic arteriovenous fistula was cannulated with a micropuncture needle under direct ultrasound guidance where it was patent and a permanent image was performed.  The microwire was advanced into the fistula and the needle was exchanged for the a microsheath.  I then upsized to a 6 Fr Sheath and imaging was performed.  Hand injections were completed to image the access including the central venous system. This demonstrated 90% stenosis of the proximal upper arm cephalic vein.  The remainder of the AV fistula in the central venous circulation was widely patent.  Based on the images, this patient will need intervention for his proximal proximal. I then gave the patient 3000 units of intravenous heparin .  I then crossed the stenosis with a Magic Tourqe wire.  Based on the imaging, a 7 mm x 6 cm Lutonix drug-coated angioplasty balloon was selected.  The balloon was centered around the proximal upper arm cephalic vein stenosis and inflated to burst inflation of 14 ATM for 1 minute(s) but the waist did not entirely resolve.  High-pressure angioplasty balloon 7 cm in the proximal upper arm cephalic vein and inflated this to 20 atm for the waist to break. This was slightly undersized so I upsized to a 9 mm balloon and inflated this in the proximal upper arm cephalic vein and took this to 14 ATM for one minute.  On completion imaging, a 20-25% residual stenosis was present.     Based on the completion imaging, no further intervention is necessary.  The wire and balloon were removed from the sheath.  A 4-0 Monocryl purse-string suture  was sewn around the sheath.  The sheath was removed while tying down the suture.  A sterile bandage was applied to the puncture site.  COMPLICATIONS: None  CONDITION: Stable   Selinda Gu  05/22/2024 12:30 PM   This note was created with Dragon Medical transcription system.  Any errors in dictation are purely unintentional.

## 2024-06-19 ENCOUNTER — Ambulatory Visit (INDEPENDENT_AMBULATORY_CARE_PROVIDER_SITE_OTHER): Admitting: Podiatry

## 2024-06-19 DIAGNOSIS — Z91199 Patient's noncompliance with other medical treatment and regimen due to unspecified reason: Secondary | ICD-10-CM

## 2024-06-19 NOTE — Progress Notes (Signed)
 1. No-show for appointment

## 2024-06-24 ENCOUNTER — Other Ambulatory Visit (INDEPENDENT_AMBULATORY_CARE_PROVIDER_SITE_OTHER): Payer: Self-pay | Admitting: Vascular Surgery

## 2024-06-24 DIAGNOSIS — N186 End stage renal disease: Secondary | ICD-10-CM

## 2024-06-26 ENCOUNTER — Encounter (INDEPENDENT_AMBULATORY_CARE_PROVIDER_SITE_OTHER): Payer: Self-pay | Admitting: Nurse Practitioner

## 2024-06-26 ENCOUNTER — Ambulatory Visit (INDEPENDENT_AMBULATORY_CARE_PROVIDER_SITE_OTHER): Admitting: Nurse Practitioner

## 2024-06-26 ENCOUNTER — Ambulatory Visit (INDEPENDENT_AMBULATORY_CARE_PROVIDER_SITE_OTHER)

## 2024-06-26 VITALS — BP 168/76 | HR 66 | Ht 69.0 in | Wt 193.0 lb

## 2024-06-26 DIAGNOSIS — N186 End stage renal disease: Secondary | ICD-10-CM

## 2024-06-26 DIAGNOSIS — I10 Essential (primary) hypertension: Secondary | ICD-10-CM

## 2024-06-26 DIAGNOSIS — E785 Hyperlipidemia, unspecified: Secondary | ICD-10-CM | POA: Diagnosis not present

## 2024-06-30 ENCOUNTER — Encounter (INDEPENDENT_AMBULATORY_CARE_PROVIDER_SITE_OTHER): Payer: Self-pay | Admitting: Nurse Practitioner

## 2024-06-30 NOTE — Progress Notes (Signed)
 Subjective:    Patient ID: Randall [name], male    DOB: 1961-01-12, 63 y.o.   MRN: [id number] Chief Complaint  Patient presents with   Follow-up    5 week follow up     The patient returns to the office for followup status post intervention of their dialysis access on 05/22/2024.   Following the intervention the access function has significantly improved, with better flow rates and improved KT/V. The patient has not been experiencing increased bleeding times following decannulation and the patient denies increased recirculation. The patient denies an increase in arm swelling. At the present time the patient denies hand pain.  No recent shortening of the patient's walking distance or new symptoms consistent with claudication.  No history of rest pain symptoms. No new ulcers or wounds of the lower extremities have occurred.  The patient denies amaurosis fugax or recent TIA symptoms. There are no recent neurological changes noted. There is no history of DVT, PE or superficial thrombophlebitis. No recent episodes of angina or shortness of breath documented.   Duplex ultrasound of the AV access shows a patent access.  The previously noted stenosis is improved compared to last study.  Flow volume today is 2568 cc/min (previous flow volume was 1257 cc/min)       Review of Systems  Neurological:  Positive for weakness.  All other systems reviewed and are negative.      Objective:   Physical Exam Vitals reviewed.  HENT:     Head: Normocephalic.  Cardiovascular:     Rate and Rhythm: Normal rate.     Pulses:          Radial pulses are 2+ on the left side.     Arteriovenous access: Left arteriovenous access is present.     Comments: Good thrill and bruit Pulmonary:     Effort: Pulmonary effort is normal.  Skin:    General: Skin is warm and dry.  Neurological:     Mental Status: He is alert and oriented to person, place, and time.     Motor: Weakness present.     Gait: Gait  abnormal.  Psychiatric:        Mood and Affect: Mood normal.        Behavior: Behavior normal.        Thought Content: Thought content normal.        Judgment: Judgment normal.     BP (!) 168/76   Pulse 66   Ht 5' 9 (1.753 m)   Wt 193 lb (87.5 kg)   BMI 28.50 kg/m   Past Medical History:  Diagnosis Date   Anemia    Anxiety    Arthritis    CHF (congestive heart failure) (HCC)    Chronic kidney disease    Dementia (HCC)    Depression    Diabetes mellitus without complication (HCC)    GERD (gastroesophageal reflux disease)    Heart murmur    Hypertension    Hypertensive urgency 12/09/2021   Pneumonia    Stroke [facility])     Social History   Socioeconomic History   Marital status: Divorced    Spouse name: Not on file   Number of children: Not on file   Years of education: Not on file   Highest education level: Not on file  Occupational History   Not on file  Tobacco Use   Smoking status: Former    Current packs/day: 0.25    Types: Cigars, Cigarettes  Smokeless tobacco: Not on file  Substance and Sexual Activity   Alcohol use: Not Currently    Alcohol/week: 1.0 standard drink of alcohol    Types: 1 Glasses of wine per week    Comment: rarely   Drug use: Never   Sexual activity: Not Currently  Other Topics Concern   Not on file  Social History Narrative   Destyn, Schuyler (Sister) lives with sister   Social Drivers of Corporate investment banker Strain: Low Risk  (03/20/2024)   Received from [facility]   Overall Financial Resource Strain (CARDIA)    Difficulty of Paying Living Expenses: Not hard at all  Food Insecurity: No Food Insecurity (03/20/2024)   Received from [facility]   Hunger Vital Sign    Within the past 12 months, you worried that your food would run out before you got the money to buy more.: Never true    Within the past 12 months, the food you bought just didn't last and you didn't have money to get more.: Never true  Transportation  Needs: No Transportation Needs (03/20/2024)   Received from [facility]   PRAPARE - Transportation    Lack of Transportation (Medical): No    Lack of Transportation (Non-Medical): No  Physical Activity: Inactive (03/20/2024)   Received from [facility]   Exercise Vital Sign    On average, how many days per week do you engage in moderate to strenuous exercise (like a brisk walk)?: 0 days    On average, how many minutes do you engage in exercise at this level?: 0 min  Stress: No Stress Concern Present (03/20/2024)   Received from [facility] of Occupational Health - Occupational Stress Questionnaire    Feeling of Stress : Only a little  Social Connections: Moderately Integrated (08/18/2021)   Received from [facility]   Social Connection and Isolation Panel    In a typical week, how many times do you talk on the phone with family, friends, or neighbors?: More than three times a week    How often do you get together with friends or relatives?: Once a week    How often do you attend church or religious services?: 1 to 4 times per year    Do you belong to any clubs or organizations such as church groups, unions, fraternal or athletic groups, or school groups?: Yes    How often do you attend meetings of the clubs or organizations you belong to?: Never    Are you married, widowed, divorced, separated, never married, or living with a partner?: Separated  Intimate Partner Violence: Not At Risk (03/20/2024)   Received from [facility]   Humiliation, Afraid, Rape, and Kick questionnaire    Within the last year, have you been afraid of your partner or ex-partner?: No    Within the last year, have you been humiliated or emotionally abused in other ways by your partner or ex-partner?: No    Within the last year, have you been kicked, hit, slapped, or otherwise physically hurt by your partner or ex-partner?: No    Within the last year, have you been raped or forced  to have any kind of sexual activity by your partner or ex-partner?: No    Past Surgical History:  Procedure Laterality Date   A/V FISTULAGRAM Right 05/22/2024   Procedure: A/V Fistulagram;  Surgeon: Marea Selinda RAMAN, MD;  Location: [facility] INVASIVE CV LAB;  Service:  Cardiovascular;  Laterality: Right;   AV FISTULA PLACEMENT Right 06/15/2022   Procedure: ARTERIOVENOUS (AV) FISTULA CREATION (BRACHIALCEPHALIC);  Surgeon: Marea Selinda RAMAN, MD;  Location: [facility] ORS;  Service: Vascular;  Laterality: Right;   COLONOSCOPY W/ POLYPECTOMY     dental work     DIALYSIS/PERMA CATHETER INSERTION N/A 12/30/2021   Procedure: DIALYSIS/PERMA CATHETER INSERTION;  Surgeon: Marea Selinda RAMAN, MD;  Location: [facility] INVASIVE CV LAB;  Service: Cardiovascular;  Laterality: N/A;   DIALYSIS/PERMA CATHETER REMOVAL N/A 10/19/2022   Procedure: DIALYSIS/PERMA CATHETER REMOVAL;  Surgeon: Marea Selinda RAMAN, MD;  Location: [facility] INVASIVE CV LAB;  Service: Cardiovascular;  Laterality: N/A;   ESOPHAGOGASTRODUODENOSCOPY (EGD) WITH PROPOFOL  N/A 12/26/2021   Procedure: ESOPHAGOGASTRODUODENOSCOPY (EGD) WITH PROPOFOL ;  Surgeon: Onita Elspeth Sharper, DO;  Location: [facility] ENDOSCOPY;  Service: Gastroenterology;  Laterality: N/A;   TEMPORARY DIALYSIS CATHETER N/A 12/27/2021   Procedure: TEMPORARY DIALYSIS CATHETER;  Surgeon: Jama Cordella MATSU, MD;  Location: [facility] INVASIVE CV LAB;  Service: Cardiovascular;  Laterality: N/A;   TONSILLECTOMY     adeniods removed    Family History  Problem Relation Age of Onset   Hypertension Mother    Dementia Mother    Hypertension Maternal Grandmother    Diabetes Maternal Grandmother    Stroke Maternal Grandmother    Heart attack Maternal Grandmother    Stroke Maternal Grandfather     Allergies  Allergen Reactions   Penicillins     Reaction in childhood - unknown reaction Patient states he has tolerated amoxicillin.          Latest Ref Rng & Units 07/08/2023    5:38 AM 07/07/2023    4:33 AM 07/06/2023    3:47 AM   CBC  WBC 4.0 - 10.5 K/uL 8.6  7.5  7.1   Hemoglobin 13.0 - 17.0 g/dL 89.4  89.4  89.8   Hematocrit 39.0 - 52.0 % 30.2  30.8  30.0   Platelets 150 - 400 K/uL 241  222  211       CMP     Component Value Date/Time   NA 133 (L) 07/08/2023 0538   K 3.4 (L) 07/08/2023 0538   CL 94 (L) 07/08/2023 0538   CO2 28 07/08/2023 0538   GLUCOSE 83 07/08/2023 0538   BUN 34 (H) 07/08/2023 0538   CREATININE 5.49 (H) 07/08/2023 0538   CALCIUM  8.5 (L) 07/08/2023 0538   PROT 6.0 (L) 06/30/2023 1008   ALBUMIN 2.8 (L) 07/02/2023 0717   AST 18 06/30/2023 1008   ALT 9 06/30/2023 1008   ALKPHOS 57 06/30/2023 1008   BILITOT 0.5 06/30/2023 1008   GFRNONAA 11 (L) 07/08/2023 0538     No results found.     Assessment & Plan:   1. ESRD (end stage renal disease) (HCC) (Primary) Recommend:  The patient is doing well and currently has adequate dialysis access.  Although there are some parameters suggesting possible future issues.  The patient's dialysis center is not reporting any major access issues.  However, the flow rates in the patient's dialysis access are low, in the prethrombotic range, and there is no exact stenosis identified.  This raises concerns that the access is at moderate but not high risk for a problem or thrombosis and should be followed more closely  The patient will follow-up with me in the office in 3 months.  The need for a follow up duplex ultrasound will be made at that time based on whether problems with the access are persistent.  2. Hyperlipidemia, unspecified hyperlipidemia type Continue statin as ordered and reviewed, no changes at this time  3. Essential hypertension Continue antihypertensive medications as already ordered, these medications have been reviewed and there are no changes at this time.   Current Outpatient Medications on File Prior to Visit  Medication Sig Dispense Refill   aspirin  81 MG EC tablet Take 81 mg by mouth daily.     carvedilol  (COREG ) 6.25  MG tablet Take 6.25 mg by mouth 2 (two) times daily with a meal.     lansoprazole (PREVACID) 30 MG capsule Take 30 mg by mouth daily.     rosuvastatin  (CRESTOR ) 10 MG tablet Take 10 mg by mouth daily.     sertraline  (ZOLOFT ) 100 MG tablet Take 100 mg by mouth daily.     tamsulosin  (FLOMAX ) 0.4 MG CAPS capsule Take 0.4 mg by mouth at bedtime.     lidocaine -prilocaine  (EMLA ) cream Use 3 times weekly before dialysis.     No current facility-administered medications on file prior to visit.    There are no Patient Instructions on file for this visit. No follow-ups on file.   Prithvi Kooi E Kenora Spayd, NP

## 2024-08-05 ENCOUNTER — Ambulatory Visit (INDEPENDENT_AMBULATORY_CARE_PROVIDER_SITE_OTHER): Admitting: Podiatry

## 2024-08-05 ENCOUNTER — Encounter: Payer: Self-pay | Admitting: Podiatry

## 2024-08-05 VITALS — Ht 69.0 in | Wt 193.0 lb

## 2024-08-05 DIAGNOSIS — M79675 Pain in left toe(s): Secondary | ICD-10-CM

## 2024-08-05 DIAGNOSIS — B351 Tinea unguium: Secondary | ICD-10-CM | POA: Diagnosis not present

## 2024-08-05 DIAGNOSIS — M79674 Pain in right toe(s): Secondary | ICD-10-CM | POA: Diagnosis not present

## 2024-08-05 NOTE — Progress Notes (Signed)
   Chief Complaint  Patient presents with   Nail Problem    Pt is here for [facility].    SUBJECTIVE Patient presents to office today complaining of elongated, thickened nails that cause pain while ambulating in shoes.  Patient is unable to trim their own nails. Patient is here for further evaluation and treatment.  Past Medical History:  Diagnosis Date   Anemia    Anxiety    Arthritis    CHF (congestive heart failure) (HCC)    Chronic kidney disease    Dementia (HCC)    Depression    Diabetes mellitus without complication (HCC)    GERD (gastroesophageal reflux disease)    Heart murmur    Hypertension    Hypertensive urgency 12/09/2021   Pneumonia    Stroke [facility])     OBJECTIVE General Patient is awake, alert, and oriented x 3 and in no acute distress. Derm Skin is dry and supple bilateral. Negative open lesions or macerations. Remaining integument unremarkable. Nails are tender, long, thickened and dystrophic with subungual debris, consistent with onychomycosis, 1-5 bilateral. No signs of infection noted. Vasc skin cool to touch.  Diminished pulses noted. Neuro light touch and protective threshold grossly intact Musculoskeletal Exam No symptomatic pedal deformities noted bilateral. Muscular strength within normal limits.  ASSESSMENT 1.  Pain due to onychomycosis of toenails both   PLAN OF CARE 1. Patient evaluated today.   2. Instructed to maintain good pedal hygiene and foot care.  3. Mechanical debridement of nails 1-5 bilaterally performed using a nail nipper. Filed with dremel without incident.  4.  There is some concern for possible peripheral vascular disease secondary to the lower extremities, delayed capillary refill, and diminished pulses however the patient is completely asymptomatic and there are no open wounds.  Will continue to observe for now.   5.  Return to clinic in 3 mos.    Thresa EMERSON Sar, DPM [facility]  Dr. Thresa EMERSON Sar, DPM    [address] [place], [place] [address]                Office [phone]  Fax [phone]

## 2024-08-18 ENCOUNTER — Telehealth (INDEPENDENT_AMBULATORY_CARE_PROVIDER_SITE_OTHER): Payer: Self-pay | Admitting: Nurse Practitioner

## 2024-08-18 NOTE — Telephone Encounter (Signed)
 I spoke with the patient's sister and she will let the dialysis center know they will have to send over a referral with what the patient is needing.

## 2024-08-18 NOTE — Telephone Encounter (Signed)
 Patient's sister LVM on [facility] line- states patient has appt on 09/25/24 but he is having extensive bleeding when he goes to dialysis. States they need an appointment asap. Please advise what should be scheduled for pt. [phone]

## 2024-09-23 ENCOUNTER — Encounter (INDEPENDENT_AMBULATORY_CARE_PROVIDER_SITE_OTHER): Payer: Self-pay | Admitting: Nurse Practitioner

## 2024-09-23 DIAGNOSIS — N186 End stage renal disease: Secondary | ICD-10-CM

## 2024-09-24 ENCOUNTER — Other Ambulatory Visit (INDEPENDENT_AMBULATORY_CARE_PROVIDER_SITE_OTHER): Payer: Self-pay | Admitting: Nurse Practitioner

## 2024-09-24 DIAGNOSIS — N186 End stage renal disease: Secondary | ICD-10-CM

## 2024-09-25 ENCOUNTER — Encounter (INDEPENDENT_AMBULATORY_CARE_PROVIDER_SITE_OTHER)

## 2024-09-25 ENCOUNTER — Ambulatory Visit (INDEPENDENT_AMBULATORY_CARE_PROVIDER_SITE_OTHER): Admitting: Nurse Practitioner

## 2024-10-23 ENCOUNTER — Ambulatory Visit (INDEPENDENT_AMBULATORY_CARE_PROVIDER_SITE_OTHER): Admitting: Nurse Practitioner

## 2024-10-23 ENCOUNTER — Encounter (INDEPENDENT_AMBULATORY_CARE_PROVIDER_SITE_OTHER)

## 2024-12-10 ENCOUNTER — Emergency Department

## 2024-12-10 ENCOUNTER — Inpatient Hospital Stay
Admission: EM | Admit: 2024-12-10 | Source: Home / Self Care | Attending: Internal Medicine | Admitting: Internal Medicine

## 2024-12-10 ENCOUNTER — Encounter: Payer: Self-pay | Admitting: *Deleted

## 2024-12-10 ENCOUNTER — Other Ambulatory Visit: Payer: Self-pay

## 2024-12-10 DIAGNOSIS — F321 Major depressive disorder, single episode, moderate: Secondary | ICD-10-CM | POA: Diagnosis not present

## 2024-12-10 DIAGNOSIS — N186 End stage renal disease: Secondary | ICD-10-CM

## 2024-12-10 DIAGNOSIS — J90 Pleural effusion, not elsewhere classified: Secondary | ICD-10-CM | POA: Diagnosis not present

## 2024-12-10 DIAGNOSIS — E663 Overweight: Secondary | ICD-10-CM | POA: Diagnosis present

## 2024-12-10 DIAGNOSIS — I10 Essential (primary) hypertension: Secondary | ICD-10-CM | POA: Diagnosis not present

## 2024-12-10 DIAGNOSIS — J189 Pneumonia, unspecified organism: Secondary | ICD-10-CM | POA: Diagnosis not present

## 2024-12-10 DIAGNOSIS — E785 Hyperlipidemia, unspecified: Secondary | ICD-10-CM | POA: Diagnosis not present

## 2024-12-10 DIAGNOSIS — Z9889 Other specified postprocedural states: Secondary | ICD-10-CM

## 2024-12-10 DIAGNOSIS — I5032 Chronic diastolic (congestive) heart failure: Secondary | ICD-10-CM | POA: Diagnosis present

## 2024-12-10 DIAGNOSIS — F03918 Unspecified dementia, unspecified severity, with other behavioral disturbance: Secondary | ICD-10-CM | POA: Diagnosis present

## 2024-12-10 DIAGNOSIS — R0602 Shortness of breath: Principal | ICD-10-CM

## 2024-12-10 DIAGNOSIS — I639 Cerebral infarction, unspecified: Secondary | ICD-10-CM | POA: Diagnosis present

## 2024-12-10 DIAGNOSIS — I5A Non-ischemic myocardial injury (non-traumatic): Secondary | ICD-10-CM | POA: Diagnosis present

## 2024-12-10 DIAGNOSIS — E876 Hypokalemia: Secondary | ICD-10-CM | POA: Diagnosis not present

## 2024-12-10 DIAGNOSIS — Z992 Dependence on renal dialysis: Secondary | ICD-10-CM | POA: Diagnosis not present

## 2024-12-10 DIAGNOSIS — J869 Pyothorax without fistula: Secondary | ICD-10-CM

## 2024-12-10 LAB — CBC
HCT: 21.7 % — ABNORMAL LOW (ref 39.0–52.0)
Hemoglobin: 7.2 g/dL — ABNORMAL LOW (ref 13.0–17.0)
MCH: 29.3 pg (ref 26.0–34.0)
MCHC: 33.2 g/dL (ref 30.0–36.0)
MCV: 88.2 fL (ref 80.0–100.0)
Platelets: 473 10*3/uL — ABNORMAL HIGH (ref 150–400)
RBC: 2.46 MIL/uL — ABNORMAL LOW (ref 4.22–5.81)
RDW: 15.9 % — ABNORMAL HIGH (ref 11.5–15.5)
WBC: 28.7 10*3/uL — ABNORMAL HIGH (ref 4.0–10.5)
nRBC: 0 % (ref 0.0–0.2)

## 2024-12-10 LAB — BASIC METABOLIC PANEL WITH GFR
Anion gap: 15 (ref 5–15)
BUN: 22 mg/dL (ref 8–23)
CO2: 28 mmol/L (ref 22–32)
Calcium: 8.7 mg/dL — ABNORMAL LOW (ref 8.9–10.3)
Chloride: 97 mmol/L — ABNORMAL LOW (ref 98–111)
Creatinine, Ser: 3.21 mg/dL — ABNORMAL HIGH (ref 0.61–1.24)
GFR, Estimated: 21 mL/min — ABNORMAL LOW
Glucose, Bld: 164 mg/dL — ABNORMAL HIGH (ref 70–99)
Potassium: 3.4 mmol/L — ABNORMAL LOW (ref 3.5–5.1)
Sodium: 140 mmol/L (ref 135–145)

## 2024-12-10 LAB — LACTIC ACID, PLASMA: Lactic Acid, Venous: 1.8 mmol/L (ref 0.5–1.9)

## 2024-12-10 LAB — TROPONIN T, HIGH SENSITIVITY
Troponin T High Sensitivity: 197 ng/L (ref 0–19)
Troponin T High Sensitivity: 192 ng/L (ref 0–19)

## 2024-12-10 MED ORDER — VANCOMYCIN HCL IN DEXTROSE 1-5 GM/200ML-% IV SOLN
1000.0000 mg | Freq: Once | INTRAVENOUS | Status: AC
  Administered 2024-12-10: 1000 mg via INTRAVENOUS
  Filled 2024-12-10: qty 200

## 2024-12-10 MED ORDER — VANCOMYCIN HCL IN DEXTROSE 1-5 GM/200ML-% IV SOLN
1000.0000 mg | Freq: Once | INTRAVENOUS | Status: DC
  Administered 2024-12-10: 1000 mg via INTRAVENOUS
  Filled 2024-12-10: qty 200

## 2024-12-10 MED ORDER — SODIUM CHLORIDE 0.9 % IV SOLN
2.0000 g | Freq: Once | INTRAVENOUS | Status: AC
  Administered 2024-12-10: 2 g via INTRAVENOUS
  Filled 2024-12-10: qty 12.5

## 2024-12-10 MED ORDER — ACETAMINOPHEN 325 MG PO TABS
650.0000 mg | ORAL_TABLET | Freq: Four times a day (QID) | ORAL | Status: AC | PRN
  Administered 2024-12-11: 650 mg via ORAL
  Filled 2024-12-10: qty 2

## 2024-12-10 MED ORDER — VANCOMYCIN HCL 2000 MG/400ML IV SOLN
2000.0000 mg | Freq: Once | INTRAVENOUS | Status: DC
  Filled 2024-12-10: qty 400

## 2024-12-10 MED ORDER — ASPIRIN 81 MG PO CHEW
324.0000 mg | CHEWABLE_TABLET | Freq: Once | ORAL | Status: AC
  Administered 2024-12-10: 324 mg via ORAL
  Filled 2024-12-10: qty 4

## 2024-12-10 MED ORDER — IOHEXOL 350 MG/ML SOLN
75.0000 mL | Freq: Once | INTRAVENOUS | Status: AC | PRN
  Administered 2024-12-10: 75 mL via INTRAVENOUS

## 2024-12-10 MED ORDER — DIPHENHYDRAMINE HCL 50 MG/ML IJ SOLN: 12.5000 mg | Freq: Three times a day (TID) | INTRAMUSCULAR | Status: AC | PRN

## 2024-12-10 MED ORDER — ALBUTEROL SULFATE (2.5 MG/3ML) 0.083% IN NEBU: 2.5000 mg | INHALATION_SOLUTION | RESPIRATORY_TRACT | Status: AC | PRN

## 2024-12-10 MED ORDER — DM-GUAIFENESIN ER 30-600 MG PO TB12: 1.0000 | ORAL_TABLET | Freq: Two times a day (BID) | ORAL | Status: AC | PRN

## 2024-12-10 MED ORDER — VANCOMYCIN HCL IN DEXTROSE 1-5 GM/200ML-% IV SOLN
1000.0000 mg | Freq: Once | INTRAVENOUS | Status: AC
  Administered 2024-12-10: 1000 mg via INTRAVENOUS

## 2024-12-10 MED ORDER — OXYCODONE-ACETAMINOPHEN 5-325 MG PO TABS
1.0000 | ORAL_TABLET | Freq: Four times a day (QID) | ORAL | Status: AC | PRN
  Administered 2024-12-11 – 2024-12-12 (×3): 1 via ORAL
  Filled 2024-12-10 (×3): qty 1

## 2024-12-10 MED ORDER — SODIUM CHLORIDE 0.9 % IV SOLN
1.0000 g | INTRAVENOUS | Status: AC
  Administered 2024-12-11 – 2024-12-12 (×2): 1 g via INTRAVENOUS
  Filled 2024-12-10 (×2): qty 10

## 2024-12-10 MED ORDER — HYDRALAZINE HCL 20 MG/ML IJ SOLN: 5.0000 mg | INTRAMUSCULAR | Status: DC | PRN
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
## 2024-12-10 NOTE — ED Notes (Signed)
 Patient resting in bed free from sign of distress. Breathing unlabored speaking in full sentences with symmetric chest rise and fall. Bed low and locked with side rails raised x2. Call bell in reach and monitor in place. Pt family present at bedside.   Pt L hand iv noted to be coming untapped. IV re-secured with new tegaderm and tape and solidified with coban. IV flushed and patent and has abx running through it.

## 2024-12-10 NOTE — ED Notes (Signed)
 Pt trop 197. Dr Floy notified. Orders to be placed as needed.
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
## 2024-12-10 NOTE — ED Notes (Signed)
 After initiating ordered abx, MD discontinued 1g vanc. Order in preference for 2 g concentration. RN called pharmacist and requested they adjust order to reflect a 2nd bag of 1 g vanc. To be administered after completion of current one since that one already running. Pharmacist states this is acceptable as it will be the same concentration for pt and will  adjust orders to reflect. Primary RN made aware of conversation. Initial 1g bag of vanc. Running still at previously ordered rate.
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
## 2024-12-10 NOTE — Consult Note (Signed)
 Pharmacy Antibiotic Note  Randall [name] is a 64 y.o. male admitted on 12/10/2024 with pneumonia.  Pharmacy has been consulted for Vancomycin  and Cefepime  dosing. Of note, patient has ESRD and receives dialysis outpatient.   Plan: Vancomycin  2g IV x 1 as loading dose, followed by 1g IV to be given on dialysis days. Will follow up with nephrology regarding dialysis schedule  Cefepime  1g IV Q 24 hours  Will continue to monitor cultures and renal function and adjust antibiotics as appropriate  Height: 5' 9 (175.3 cm) Weight: 87 kg (191 lb 12.8 oz) IBW/kg (Calculated) : 70.7  Temp (24hrs), Avg:98.7 F (37.1 C), Min:98.7 F (37.1 C), Max:98.7 F (37.1 C)  Recent Labs  Lab 12/10/24 1759 12/10/24 1946  WBC 28.7*  --   CREATININE 3.21*  --   LATICACIDVEN  --  1.8    Estimated Creatinine Clearance: 25.7 mL/min (A) (by C-G formula based on SCr of 3.21 mg/dL (H)).    Allergies[1]  Antimicrobials this admission: Vancomycin  2/4 >>  Cefepime  2/4 >>   Dose adjustments this admission: Antibiotics dosed via recommendations for hemodialysis patients  Microbiology results: 2/4 BCx: collected 2/4 MRSA PCR: pending  Thank you for allowing pharmacy to be a part of this patients care.  Caliah Kopke A Leonardo Makris 12/10/2024 9:36 PM     [1]  Allergies Allergen Reactions   Penicillins     Reaction in childhood - unknown reaction Patient states he has tolerated amoxicillin.

## 2024-12-11 ENCOUNTER — Ambulatory Visit: Admitting: Podiatry

## 2024-12-11 ENCOUNTER — Inpatient Hospital Stay

## 2024-12-11 DIAGNOSIS — Z992 Dependence on renal dialysis: Secondary | ICD-10-CM | POA: Diagnosis not present

## 2024-12-11 DIAGNOSIS — J189 Pneumonia, unspecified organism: Secondary | ICD-10-CM | POA: Diagnosis not present

## 2024-12-11 DIAGNOSIS — E119 Type 2 diabetes mellitus without complications: Secondary | ICD-10-CM | POA: Diagnosis not present

## 2024-12-11 DIAGNOSIS — J869 Pyothorax without fistula: Secondary | ICD-10-CM

## 2024-12-11 DIAGNOSIS — I5A Non-ischemic myocardial injury (non-traumatic): Secondary | ICD-10-CM | POA: Diagnosis present

## 2024-12-11 DIAGNOSIS — N186 End stage renal disease: Secondary | ICD-10-CM | POA: Diagnosis not present

## 2024-12-11 LAB — CBC
HCT: 19.4 % — ABNORMAL LOW (ref 39.0–52.0)
HCT: 17.8 % — ABNORMAL LOW (ref 39.0–52.0)
Hemoglobin: 6.4 g/dL — ABNORMAL LOW (ref 13.0–17.0)
Hemoglobin: 5.8 g/dL — ABNORMAL LOW (ref 13.0–17.0)
MCH: 29.4 pg (ref 26.0–34.0)
MCH: 29.3 pg (ref 26.0–34.0)
MCHC: 33 g/dL (ref 30.0–36.0)
MCHC: 32.6 g/dL (ref 30.0–36.0)
MCV: 89 fL (ref 80.0–100.0)
MCV: 89.9 fL (ref 80.0–100.0)
Platelets: 445 10*3/uL — ABNORMAL HIGH (ref 150–400)
Platelets: 402 10*3/uL — ABNORMAL HIGH (ref 150–400)
RBC: 2.18 MIL/uL — ABNORMAL LOW (ref 4.22–5.81)
RBC: 1.98 MIL/uL — ABNORMAL LOW (ref 4.22–5.81)
RDW: 16.2 % — ABNORMAL HIGH (ref 11.5–15.5)
RDW: 16.5 % — ABNORMAL HIGH (ref 11.5–15.5)
WBC: 27.1 10*3/uL — ABNORMAL HIGH (ref 4.0–10.5)
WBC: 24.5 10*3/uL — ABNORMAL HIGH (ref 4.0–10.5)
nRBC: 0 % (ref 0.0–0.2)
nRBC: 0 % (ref 0.0–0.2)

## 2024-12-11 LAB — HEPATITIS B SURFACE ANTIGEN: Hepatitis B Surface Ag: NONREACTIVE

## 2024-12-11 LAB — BODY FLUID CELL COUNT WITH DIFFERENTIAL
Eos, Fluid: 1 %
Lymphs, Fluid: 3 %
Monocyte-Macrophage-Serous Fluid: 4 % — ABNORMAL LOW (ref 50–90)
Neutrophil Count, Fluid: 92 % — ABNORMAL HIGH (ref 0–25)
Total Nucleated Cell Count, Fluid: 79391 uL — ABNORMAL HIGH (ref 0–1000)

## 2024-12-11 LAB — LACTATE DEHYDROGENASE: LDH: 255 U/L — ABNORMAL HIGH (ref 105–235)

## 2024-12-11 LAB — PROTIME-INR
INR: 1.3 — ABNORMAL HIGH (ref 0.8–1.2)
Prothrombin Time: 16.4 s — ABNORMAL HIGH (ref 11.4–15.2)

## 2024-12-11 LAB — PREPARE RBC (CROSSMATCH)

## 2024-12-11 LAB — TROPONIN T, HIGH SENSITIVITY
Troponin T High Sensitivity: 182 ng/L (ref 0–19)
Troponin T High Sensitivity: 207 ng/L (ref 0–19)

## 2024-12-11 LAB — BASIC METABOLIC PANEL WITH GFR
Anion gap: 12 (ref 5–15)
BUN: 29 mg/dL — ABNORMAL HIGH (ref 8–23)
CO2: 27 mmol/L (ref 22–32)
Calcium: 8.2 mg/dL — ABNORMAL LOW (ref 8.9–10.3)
Chloride: 95 mmol/L — ABNORMAL LOW (ref 98–111)
Creatinine, Ser: 3.81 mg/dL — ABNORMAL HIGH (ref 0.61–1.24)
GFR, Estimated: 17 mL/min — ABNORMAL LOW
Glucose, Bld: 196 mg/dL — ABNORMAL HIGH (ref 70–99)
Potassium: 3 mmol/L — ABNORMAL LOW (ref 3.5–5.1)
Sodium: 135 mmol/L (ref 135–145)

## 2024-12-11 LAB — MRSA NEXT GEN BY PCR, NASAL: MRSA by PCR Next Gen: NOT DETECTED

## 2024-12-11 LAB — APTT: aPTT: 42 s — ABNORMAL HIGH (ref 24–36)

## 2024-12-11 LAB — MAGNESIUM: Magnesium: 1.9 mg/dL (ref 1.7–2.4)

## 2024-12-11 LAB — HIV ANTIBODY (ROUTINE TESTING W REFLEX): HIV Screen 4th Generation wRfx: NONREACTIVE

## 2024-12-11 MED ORDER — SODIUM CHLORIDE 0.9% IV SOLUTION
Freq: Once | INTRAVENOUS | Status: AC
  Filled 2024-12-11: qty 250

## 2024-12-11 MED ORDER — ASPIRIN 81 MG PO TBEC
81.0000 mg | DELAYED_RELEASE_TABLET | Freq: Every day | ORAL | Status: AC
  Administered 2024-12-11 – 2024-12-12 (×2): 81 mg via ORAL
  Filled 2024-12-11 (×2): qty 1

## 2024-12-11 MED ORDER — PANTOPRAZOLE SODIUM 20 MG PO TBEC
20.0000 mg | DELAYED_RELEASE_TABLET | Freq: Every day | ORAL | Status: AC
  Administered 2024-12-11 – 2024-12-12 (×2): 20 mg via ORAL
  Filled 2024-12-11 (×2): qty 1

## 2024-12-11 MED ORDER — CARVEDILOL 6.25 MG PO TABS
3.1250 mg | ORAL_TABLET | ORAL | Status: DC
  Administered 2024-12-11: 3.125 mg via ORAL
  Filled 2024-12-11: qty 1

## 2024-12-11 MED ORDER — LACTATED RINGERS IV BOLUS
1000.0000 mL | Freq: Once | INTRAVENOUS | Status: AC
  Administered 2024-12-11: 1000 mL via INTRAVENOUS

## 2024-12-11 MED ORDER — SERTRALINE HCL 50 MG PO TABS
100.0000 mg | ORAL_TABLET | Freq: Every day | ORAL | Status: AC
  Administered 2024-12-11 – 2024-12-12 (×2): 100 mg via ORAL
  Filled 2024-12-11 (×2): qty 2

## 2024-12-11 MED ORDER — CARVEDILOL 6.25 MG PO TABS
3.1250 mg | ORAL_TABLET | Freq: Two times a day (BID) | ORAL | Status: DC
  Administered 2024-12-11: 3.125 mg via ORAL
  Filled 2024-12-11: qty 1

## 2024-12-11 MED ORDER — ROSUVASTATIN CALCIUM 10 MG PO TABS
10.0000 mg | ORAL_TABLET | Freq: Every day | ORAL | Status: AC
  Administered 2024-12-11 – 2024-12-12 (×2): 10 mg via ORAL
  Filled 2024-12-11 (×2): qty 1

## 2024-12-11 MED ORDER — SENNOSIDES-DOCUSATE SODIUM 8.6-50 MG PO TABS
1.0000 | ORAL_TABLET | Freq: Two times a day (BID) | ORAL | Status: AC
  Administered 2024-12-11 – 2024-12-12 (×3): 1 via ORAL
  Filled 2024-12-11 (×3): qty 1

## 2024-12-11 MED ORDER — SODIUM CHLORIDE 0.9 % IV BOLUS
500.0000 mL | Freq: Once | INTRAVENOUS | Status: AC
  Administered 2024-12-11: 500 mL via INTRAVENOUS

## 2024-12-11 MED ORDER — VANCOMYCIN VARIABLE DOSE PER UNSTABLE RENAL FUNCTION (PHARMACIST DOSING): Status: DC

## 2024-12-11 MED ORDER — POLYETHYLENE GLYCOL 3350 17 G PO PACK: 17.0000 g | PACK | Freq: Every day | ORAL | Status: AC | PRN

## 2024-12-11 MED ORDER — CARVEDILOL 6.25 MG PO TABS: 3.1250 mg | ORAL_TABLET | ORAL | Status: DC

## 2024-12-11 MED ORDER — LIDOCAINE HCL (PF) 1 % IJ SOLN
10.0000 mL | Freq: Once | INTRAMUSCULAR | Status: AC
  Administered 2024-12-11: 10 mL via INTRADERMAL

## 2024-12-11 NOTE — Procedures (Signed)
 Interventional Radiology Procedure:   Indications: Loculated left pleural effusion  Procedure: US  guided thoracentesis  Findings: Removed 150 ml of thick yellow purulent fluid from a posterior left pleural fluid collection.   Complications: None     EBL: Less than 10 ml  Plan: Follow up CXR.     Nailyn Dearinger R. Philip, MD  Pager: [phone]

## 2024-12-11 NOTE — Progress Notes (Signed)
"  ° °      Overnight   NAME: Randall [name] MRN: [id number] DOB : [birth year]    Date of Service   12/11/2024   HPI/Events of Note   HPI:  64 y.o. male with medical history significant of ESRD-HD (MWF), HTN, HLD, diet-controled DM, dCHF, stroke and ICH, dementia, depression with anxiety, BPH, anemia, who presents with chest pain, cough, mild SOB.    Overnight: Notified by RN of hypotension, since chest tube placement current blood pressure 91/47 MAP of 60.  RN reports patient is confused and lethargic.  Day team gave 1 L bolus with no improvement in blood pressure.  On arrival to room patient difficult to arouse however able to respond to name.  On chart review hemoglobin 6.8 stat CBC and type and screen ordered.  500 cc bolus ordered.  300 purulent output from chest tube.  Physical Exam Vitals reviewed.  Constitutional:      General: He is sleeping.     Appearance: He is ill-appearing.  HENT:     Mouth/Throat:     Mouth: Mucous membranes are dry.  Eyes:     Pupils: Pupils are equal, round, and reactive to light.  Cardiovascular:     Pulses:          Radial pulses are 2+ on the right side and 2+ on the left side.     Heart sounds: Normal heart sounds, S1 normal and S2 normal.  Pulmonary:     Effort: Pulmonary effort is normal.     Breath sounds: Normal breath sounds and air entry.  Chest:     Chest wall: Tenderness present. No crepitus.     Comments: Left sided posterior ct present, no drainage on bandage noted.  Skin:    General: Skin is warm and dry.  Neurological:     Mental Status: He is lethargic and disoriented.     GCS: GCS eye subscore is 4. GCS verbal subscore is 4. GCS motor subscore is 6.     Motor: Weakness present.  Psychiatric:        Behavior: Behavior is slowed.        Cognition and Memory: Cognition is impaired.       Interventions/ Plan   500 cc bolus ordered CBC with hemoglobin of 5.81 unit of packed red blood cells ordered     Updates 0210- pt  awake and responsive to voice now post blood transfusion, able to tolerate PO and awaiting repeat H and H     Laneta Almarie Peter BSN RN CCRN AGACNP-BC Acute Care Nurse Practitioner [facility] [place]  "

## 2024-12-11 NOTE — ED Notes (Signed)
Fall bundle in place
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
## 2024-12-11 NOTE — Procedures (Signed)
 Insertion of Chest Tube Procedure Note  Randall [name]  [id number]  [birth year]  Date:12/11/24  Time:6:18 PM    Provider Performing: Darrin Barn   Procedure: Pleural Catheter Insertion w/ Imaging Guidance (67442)  Indication(s) Effusion  Consent Risks of the procedure as well as the alternatives and risks of each were explained to the patient and/or caregiver.  Consent for the procedure was obtained and is signed in the bedside chart  Anesthesia Topical only with 1% lidocaine     Time Out Verified patient identification, verified procedure, site/side was marked, verified correct patient position, special equipment/implants available, medications/allergies/relevant history reviewed, required imaging and test results available.   Sterile Technique Maximal sterile technique including full sterile barrier drape, hand hygiene, sterile gown, sterile gloves, mask, hair covering, sterile ultrasound probe cover (if used).   Procedure Description Ultrasound used to identify appropriate pleural anatomy for placement and overlying skin marked. Area of placement cleaned and draped in sterile fashion.  A 14Fr French pigtail pleural catheter was placed into the left pleural space using Seldinger technique. Appropriate return of pus was obtained.  The tube was connected to atrium and placed on -20 cm H2O wall suction.   Complications/Tolerance None; patient tolerated the procedure well. Chest X-ray is ordered to verify placement.   EBL Minimal  Specimen(s) none  Darrin Barn, MD [facility] 12/11/2024 6:18 PM

## 2024-12-11 NOTE — Hospital Course (Signed)
 64 y.o. male with medical history significant of ESRD-HD (MWF), HTN, HLD, diet-controled DM, dCHF, stroke and ICH, dementia, depression with anxiety, BPH, anemia, who presents with chest pain, cough, mild SOB.    Assessment and Plan:   Concern for healthcare associated pneumonia - Noted marked leukocytosis with CT findings consistent with pneumonia.  Currently on empiric cefepime  plus vancomycin .  Sputum/urine antigens pending.  Mucinex /nebulizers on board.   Concern for empyema-left - CT noting loculated left pleural effusion.  Scheduled to receive diagnostic thoracentesis later today.  Empiric antibiotics as above.   ESRD - Continue HD MWF.  Nephrology on board.   Elevated troponins - Minimally elevated flat suggesting demand ischemia.   Hypertension/hyperlipidemia/Hx of CVA - Coreg , aspirin , Crestor  on board   Chronic HFpEF - Does not appear to be acutely exacerbated.   Underlying dementia with possible acute metabolic encephalopathy - Possible delirium exacerbated acute illness and underlying dementia.  Continue to monitor closely.  Fall precautions.

## 2024-12-11 NOTE — ED Notes (Addendum)
"  Hospitalist NP at bedside.   "

## 2024-12-11 NOTE — ED Notes (Addendum)
 Hospitalist team notified of hypotension and current mentation.

## 2024-12-11 NOTE — Progress Notes (Signed)
 1. Appointment canceled by hospital   Patient currently admitted to [facility].
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
## 2024-12-11 NOTE — ED Notes (Signed)
 Applied ear pulse ox at this time. Pt finger pulse ox was not reading. Pt is now back on monitor at this time. Pt is sleeping at this time.

## 2024-12-11 NOTE — Consult Note (Signed)
" ° °  NAME:  Randall [name], MRN:  [id number], DOB:  09-14-1961, LOS: 1 ADMISSION DATE:  12/10/2024, CONSULTATION DATE:  12/11/2024 REFERRING MD:  Arlon Honey, CHIEF COMPLAINT:  Empyema   History of Present Illness:  Case of a 64 years old male with a history of type II DM, Bells Palsy, CVA, CHF ESRD on HD here with chest pain. Found to have a large left sided pleural effusion.   Labs with elevated white count at 27. Elevated platelets at 445.   CTA chest with multiloculated left pleural effusion large.   Underwent thoracentesis today with purulent fluid drained around 150cc.  Pulmonary team is being consulted for Chest tube placement.   Pertinent  Medical History  As above.   Objective    Blood pressure (!) 148/87, pulse 92, temperature 98 F (36.7 C), temperature source Oral, resp. rate 18, height 5' 9 (1.753 m), weight 87 kg, SpO2 100%.        Intake/Output Summary (Last 24 hours) at 12/11/2024 1820 Last data filed at 12/10/2024 2146 Gross per 24 hour  Intake 128.99 ml  Output --  Net 128.99 ml   Filed Weights   12/10/24 1756  Weight: 87 kg    Examination: General: Elderly, chronically ill HENT: Supple neck, reactive pupils Lungs: diminished over the left hemithorax Cardiovascular: Normal s1, Normal s2, RRR Abdomen: Soft, non tender, non distended  Extremities: Warm and well perfused  Assessment and Plan  #Left sided multiloculated empyema s/p CT placement 12/11/2024  #ESRD on HD  #Type II DM  #Hx of CVA and bells palsy   []  CT tube placement and will need intrapleural lytics in the am.  []  CT to - 40cmH2O. Q6H flushes.  []  Daily CXR  []  Abx per primary team. Would recommend including anaerobic coverage.   I personally spent a total of 80 minutes in the care of the patient today including preparing to see the patient, getting/reviewing separately obtained history, performing a medically appropriate exam/evaluation, counseling and educating, placing orders,  documenting clinical information in the EHR, independently interpreting results, and communicating results.   Darrin Barn, MD [facility] 12/11/2024 6:28 PM    "

## 2024-12-11 NOTE — ED Notes (Signed)
 Pt has become hypotensive after insertion of chest tube. Dr Arlon notified through secure chat at this time.

## 2024-12-11 NOTE — Progress Notes (Signed)
 I called his Sister Joann who is his power of attorney and discussed the thoracentesis with purulent drainage. Unfortunately no chest tube was placed. Discussed that we need to place a chest tube for drainage. She is agreeable and consented.   Darrin Barn, MD [facility] 12/11/2024 5:31 PM

## 2024-12-11 NOTE — Progress Notes (Deleted)
 IR BRIEF PROGRESS NOTE:   I was present during limited US  of the chest to evaluate for effusion.  There is minimal to no pleural fluid seen on US .   There is no adequate pocket of fluid to safely proceed with  thoracentesis.  Thersia LULLA Rummer PA-C 12/11/2024 3:01 PM

## 2024-12-12 ENCOUNTER — Inpatient Hospital Stay

## 2024-12-12 DIAGNOSIS — J869 Pyothorax without fistula: Secondary | ICD-10-CM

## 2024-12-12 LAB — BODY FLUID CULTURE W GRAM STAIN: Culture: NO GROWTH

## 2024-12-12 LAB — BPAM RBC
Blood Product Expiration Date: 202602282359
ISSUE DATE / TIME: 202602052213
Unit Type and Rh: 6200

## 2024-12-12 LAB — CULTURE, BLOOD (ROUTINE X 2)
Culture: NO GROWTH
Culture: NO GROWTH

## 2024-12-12 LAB — CBC
HCT: 22.4 % — ABNORMAL LOW (ref 39.0–52.0)
Hemoglobin: 7.6 g/dL — ABNORMAL LOW (ref 13.0–17.0)
MCH: 30.3 pg (ref 26.0–34.0)
MCHC: 33.9 g/dL (ref 30.0–36.0)
MCV: 89.2 fL (ref 80.0–100.0)
Platelets: 418 10*3/uL — ABNORMAL HIGH (ref 150–400)
RBC: 2.51 MIL/uL — ABNORMAL LOW (ref 4.22–5.81)
RDW: 15.8 % — ABNORMAL HIGH (ref 11.5–15.5)
WBC: 22.8 10*3/uL — ABNORMAL HIGH (ref 4.0–10.5)
nRBC: 0 % (ref 0.0–0.2)

## 2024-12-12 LAB — COMPREHENSIVE METABOLIC PANEL WITH GFR
ALT: 8 U/L (ref 0–44)
AST: 17 U/L (ref 15–41)
Albumin: 2.5 g/dL — ABNORMAL LOW (ref 3.5–5.0)
Alkaline Phosphatase: 154 U/L — ABNORMAL HIGH (ref 38–126)
Anion gap: 13 (ref 5–15)
BUN: 38 mg/dL — ABNORMAL HIGH (ref 8–23)
CO2: 27 mmol/L (ref 22–32)
Calcium: 8.4 mg/dL — ABNORMAL LOW (ref 8.9–10.3)
Chloride: 98 mmol/L (ref 98–111)
Creatinine, Ser: 4.75 mg/dL — ABNORMAL HIGH (ref 0.61–1.24)
GFR, Estimated: 13 mL/min — ABNORMAL LOW
Glucose, Bld: 123 mg/dL — ABNORMAL HIGH (ref 70–99)
Potassium: 2.9 mmol/L — ABNORMAL LOW (ref 3.5–5.1)
Sodium: 137 mmol/L (ref 135–145)
Total Bilirubin: 0.6 mg/dL (ref 0.0–1.2)
Total Protein: 6.8 g/dL (ref 6.5–8.1)

## 2024-12-12 LAB — MAGNESIUM: Magnesium: 1.9 mg/dL (ref 1.7–2.4)

## 2024-12-12 LAB — TROPONIN T, HIGH SENSITIVITY: Troponin T High Sensitivity: 207 ng/L (ref 0–19)

## 2024-12-12 LAB — TYPE AND SCREEN
ABO/RH(D): A POS
Antibody Screen: NEGATIVE
Unit division: 0

## 2024-12-12 LAB — PATHOLOGIST SMEAR REVIEW

## 2024-12-12 LAB — GLUCOSE, CAPILLARY: Glucose-Capillary: 84 mg/dL (ref 70–99)

## 2024-12-12 LAB — VANCOMYCIN, RANDOM: Vancomycin Rm: 17 ug/mL

## 2024-12-12 MED ORDER — VANCOMYCIN HCL 500 MG/100ML IV SOLN
500.0000 mg | Freq: Once | INTRAVENOUS | Status: AC
  Filled 2024-12-12: qty 100

## 2024-12-12 MED ORDER — VANCOMYCIN HCL 750 MG/150ML IV SOLN
750.0000 mg | INTRAVENOUS | Status: AC
  Administered 2024-12-12: 750 mg via INTRAVENOUS
  Filled 2024-12-12 (×2): qty 150

## 2024-12-12 MED ORDER — PENTAFLUOROPROP-TETRAFLUOROETH EX AERO
INHALATION_SPRAY | CUTANEOUS | Status: AC
  Filled 2024-12-12: qty 30

## 2024-12-12 MED ORDER — VANCOMYCIN HCL 750 MG/150ML IV SOLN
750.0000 mg | INTRAVENOUS | Status: DC
  Filled 2024-12-12: qty 150

## 2024-12-12 MED ORDER — EPOETIN ALFA-EPBX 10000 UNIT/ML IJ SOLN
INTRAMUSCULAR | Status: AC
  Filled 2024-12-12: qty 1

## 2024-12-12 MED ORDER — EPOETIN ALFA-EPBX 10000 UNIT/ML IJ SOLN
10000.0000 [IU] | INTRAMUSCULAR | Status: AC
  Administered 2024-12-12: 10000 [IU] via INTRAVENOUS
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
## 2024-12-12 NOTE — Evaluation (Signed)
 Physical Therapy Evaluation Patient Details Name: Randall [name] MRN: [id number] DOB: 10/29/1961 Today's Date: 12/12/2024  History of Present Illness  Pt is a 64 y/o M admitted on 12/11/24 after presenting with chest pain, cough, mild SOB. Pt found to have pleural effusion, s/p thoracentesis & chest tube placement. PMH: DM2, Bells palsy, CVA, CHF, ESRD On HD, HTN, HLD, ICH, depression, anxiety, dementia, anemia  Clinical Impression  Pt seen for PT evaluation with co-tx with OT. Pt received in bed, no family/friends present in room, pt very lethargic. Pt requires total assist +2 for supine<>sit, CGA<>min assist for static sitting EOB. Pt does not attempt to wash face even with cloth placed in his hand, nor does he attempt to remove it from his head. Upon return supine, pt does open both eyes & verbalizes hey. Recommend ongoing PT services to progress mobility as able.        If plan is discharge home, recommend the following: Two people to help with bathing/dressing/bathroom;Two people to help with walking and/or transfers;Direct supervision/assist for medications management;Help with stairs or ramp for entrance;Assistance with feeding;Assist for transportation;Assistance with cooking/housework;Direct supervision/assist for financial management;Supervision due to cognitive status   Can travel by private vehicle   No    Equipment Recommendations None recommended by PT (defer to next venue)  Recommendations for Other Services       Functional Status Assessment Patient has had a recent decline in their functional status and demonstrates the ability to make significant improvements in function in a reasonable and predictable amount of time.     Precautions / Restrictions Precautions Precautions: Fall Precaution/Restrictions Comments: L chest tube Restrictions Weight Bearing Restrictions Per Provider Order: No      Mobility  Bed Mobility Overal bed mobility: Needs Assistance Bed  Mobility: Supine to Sit, Sit to Supine     Supine to sit: Total assist, +2 for physical assistance, HOB elevated, Used rails, +2 for safety/equipment Sit to supine: Total assist, +2 for physical assistance, +2 for safety/equipment, HOB elevated, Used rails   General bed mobility comments: +2 to scoot to [facility]    Transfers                   General transfer comment: Attempted to facilitate sit>stand from EOB with PT blocking L knee to prevent buckling, assistance to initiate but pt does not participate.    Ambulation/Gait                  Stairs            Wheelchair Mobility     Tilt Bed    Modified Rankin (Stroke Patients Only)       Balance Overall balance assessment: Needs assistance Sitting-balance support: Feet supported Sitting balance-Leahy Scale: Poor Sitting balance - Comments: CGA<>min assist static sitting                                     Pertinent Vitals/Pain Pain Assessment Pain Assessment: Faces Breathing: normal Negative Vocalization: none Facial Expression: facial grimacing (with some LUE/LLE movements (PROM)) Body Language: tense, distressed pacing, fidgeting (with some LUE/LLE movements (PROM)) Consolability: distracted or reassured by voice/touch PAINAD Score: 4    Home Living Family/patient expects to be discharged to:: Unsure                   Additional Comments: no family/friends present & pt unable to report  Prior Function Prior Level of Function : Patient poor historian/Family not available                     Extremity/Trunk Assessment   Upper Extremity Assessment Upper Extremity Assessment: Defer to OT evaluation (pt with tense LUE but able to extend elbow & digits fully with PROM)    Lower Extremity Assessment Lower Extremity Assessment: LLE deficits/detail LLE Deficits / Details: LLE rigid when sitting EOB but able extend L knee with PROM once supine, LLE clonus  observed       Communication   Communication Communication: Impaired Factors Affecting Communication:  (pt only verbalized one word during session hey)    Cognition Arousal: Lethargic Behavior During Therapy: Flat affect   PT - Cognitive impairments: Difficult to assess, No family/caregiver present to determine baseline Difficult to assess due to: Level of arousal                       Following commands: Impaired Following commands impaired: Follows one step commands with increased time, Follows one step commands inconsistently     Cueing Cueing Techniques: Verbal cues, Tactile cues, Gestural cues     General Comments      Exercises     Assessment/Plan    PT Assessment Patient needs continued PT services  PT Problem List Decreased strength;Cardiopulmonary status limiting activity;Decreased coordination;Decreased range of motion;Decreased cognition;Decreased activity tolerance;Decreased knowledge of use of DME;Decreased balance;Decreased mobility;Decreased knowledge of precautions;Decreased safety awareness       PT Treatment Interventions DME instruction;Therapeutic exercise;Balance training;Stair training;Neuromuscular re-education;Gait training;Functional mobility training;Therapeutic activities;Patient/family education;Cognitive remediation    PT Goals (Current goals can be found in the Care Plan section)  Acute Rehab PT Goals PT Goal Formulation: Patient unable to participate in goal setting Time For Goal Achievement: 12/26/24 Potential to Achieve Goals: Fair    Frequency Min 2X/week     Co-evaluation PT/OT/SLP Co-Evaluation/Treatment: Yes Reason for Co-Treatment: For patient/therapist safety;Necessary to address cognition/behavior during functional activity;To address functional/ADL transfers;Complexity of the patient's impairments (multi-system involvement) PT goals addressed during session: Mobility/safety with mobility;Balance          AM-PAC PT 6 Clicks Mobility  Outcome Measure Help needed turning from your back to your side while in a flat bed without using bedrails?: A Lot Help needed moving from lying on your back to sitting on the side of a flat bed without using bedrails?: Total Help needed moving to and from a bed to a chair (including a wheelchair)?: Total Help needed standing up from a chair using your arms (e.g., wheelchair or bedside chair)?: Total Help needed to walk in hospital room?: Total Help needed climbing 3-5 steps with a railing? : Total 6 Click Score: 7    End of Session   Activity Tolerance: Patient limited by fatigue;Patient limited by lethargy Patient left: in bed;with call bell/phone within reach;with bed alarm set Nurse Communication: Mobility status (discussed chest tube wall suction with nurse & provider re: appropriate amount of suction) PT Visit Diagnosis: Muscle weakness (generalized) (M62.81);Other abnormalities of gait and mobility (R26.89);Difficulty in walking, not elsewhere classified (R26.2)    Time: 8970-8956 PT Time Calculation (min) (ACUTE ONLY): 14 min   Charges:   PT Evaluation $PT Eval Moderate Complexity: 1 Mod   PT General Charges $$ ACUTE PT VISIT: 1 Visit         Richerd Pinal, PT, DPT 12/12/24, 11:19 AM   Richerd CHRISTELLA Pinal 12/12/2024, 11:18 AM

## 2024-12-12 NOTE — Consult Note (Signed)
 Pharmacy Antibiotic Note  Randall [name] is a 64 y.o. male admitted on 12/10/2024 with pneumonia.  Pharmacy has been consulted for Vancomycin  and Cefepime  dosing. Of note, patient has ESRD and receives dialysis outpatient.  2/6 vancomycin  random level = 17 mcg/mL  Plan: Vancomycin  500 mg IV x 1 then will continue with 750 mg IV MWF with HD - F/U HD tolerance and monitor weekly pre-HD levels  Cefepime  1g IV Q 24 hours  Will continue to monitor cultures and renal function and adjust antibiotics as appropriate  Height: 5' 9 (175.3 cm) Weight: 78.5 kg (173 lb 1 oz) IBW/kg (Calculated) : 70.7  Temp (24hrs), Avg:98.4 F (36.9 C), Min:98 F (36.7 C), Max:98.9 F (37.2 C)  Recent Labs  Lab 12/10/24 1759 12/10/24 1946 12/11/24 0403 12/11/24 2031 12/12/24 0540  WBC 28.7*  --  27.1* 24.5* 22.8*  CREATININE 3.21*  --  3.81*  --  4.75*  LATICACIDVEN  --  1.8  --   --   --   VANCORANDOM  --   --   --   --  17    Estimated Creatinine Clearance: 15.9 mL/min (A) (by C-G formula based on SCr of 4.75 mg/dL (H)).    Allergies[1]  Antimicrobials this admission: Vancomycin  2/4 >>  Cefepime  2/4 >>   Dose adjustments this admission: Antibiotics dosed via recommendations for hemodialysis patients  Microbiology results: 2/4 BCx: NGTD 2/4 MRSA PCR: neg 2/5 pleural fluid: GPC, GNR  Thank you for allowing pharmacy to be a part of this patients care.  Randall [name] 12/12/2024 9:19 AM      [1]  Allergies Allergen Reactions   Penicillins     Reaction in childhood - unknown reaction  Patient states he has tolerated amoxicillin.  Other Reaction(s): Unknown

## 2024-12-12 NOTE — Progress Notes (Signed)
" °   12/12/24 0308  Assess: MEWS Score  Temp 98.5 F (36.9 C)  BP (!) 166/73  MAP (mmHg) 101  Pulse Rate 76  Resp (!) 22  SpO2 (!) 85 %  O2 Device Room Air  Assess: MEWS Score  MEWS Temp 0  MEWS Systolic 0  MEWS Pulse 0  MEWS RR 1  MEWS LOC 1  MEWS Score 2  MEWS Score Color Yellow  Assess: if the MEWS score is Yellow or Red  Were vital signs accurate and taken at a resting state? Yes  Does the patient meet 2 or more of the SIRS criteria? No  MEWS guidelines implemented  Yes, yellow  Treat  MEWS Interventions Considered administering scheduled or prn medications/treatments as ordered  Take Vital Signs  Increase Vital Sign Frequency  Yellow: Q2hr x1, continue Q4hrs until patient remains green for 12hrs  Escalate  MEWS: Escalate Yellow: Discuss with charge nurse and consider notifying provider and/or RRT  Notify: Charge Nurse/RN  Name of Charge Nurse/RN Notified Levi,RN  Assess: SIRS CRITERIA  SIRS Temperature  0  SIRS Respirations  1  SIRS Pulse 0  SIRS WBC 1  SIRS Score Sum  2   Patient with a yellow MEWS in the ED. Provider aware. Vital signs continued every 4 hours. Plan of care continued. "
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
## 2024-12-12 NOTE — Plan of Care (Signed)
  Problem: Clinical Measurements: Goal: Cardiovascular complication will be avoided Outcome: Progressing   Problem: Coping: Goal: Level of anxiety will decrease Outcome: Progressing   Problem: Pain Managment: Goal: General experience of comfort will improve and/or be controlled Outcome: Progressing   Problem: Safety: Goal: Ability to remain free from injury will improve Outcome: Progressing

## 2024-12-12 NOTE — Progress Notes (Signed)
 Hemodialysis Note:  Received patient in bed to unit. Alert and oriented. Informed consent singed and in chart.  Treatment initiated: 1305 Treatment completed: 1609  Access used: Right AVF Access issues: None  Patient tolerated well. Transported back to room, alert without acute distress. Report given to patient's RN.  Total UF removed: 2 Liters Medications given: Retacrit  10000 units IV, Vancomycin  IV  Post HD weight: 80.9 Kg  [facility]

## 2024-12-12 NOTE — Care Management Important Message (Signed)
 Important Message  Patient Details  Name: Randall [name] MRN: [id number] Date of Birth: 06/23/1961   Important Message Given:  Yes - Medicare IM     Olivia Pavelko 12/12/2024, 12:52 PM

## 2024-12-12 NOTE — Evaluation (Signed)
 Occupational Therapy Evaluation Patient Details Name: Randall [name] MRN: [id number] DOB: [birth year] Today's Date: 12/12/2024   History of Present Illness   Pt is a 64 y/o M admitted on 12/11/24 after presenting with chest pain, cough, mild SOB. Pt found to have pleural effusion, s/p thoracentesis & chest tube placement. PMH: DM2, Bells palsy, CVA, CHF, ESRD On HD, HTN, HLD, ICH, depression, anxiety, dementia, anemia     Clinical Impressions Patient presenting with decreased Ind in self care,balance, functional mobility, transfers, endurance, and safety awareness. Patient is unable to verbalize PLOF. He is lethargy throughout with difficulty keeping eyes open during session. Pt responses with one word sentences 1-2 times during session. Total A of 2 for bed mobility and unable to stand with +2 assist on EOB. Patient will benefit from acute OT to increase overall independence in the areas of ADLs, functional mobility, and safety awareness in order to safely discharge.     If plan is discharge home, recommend the following:   Two people to help with walking and/or transfers;Two people to help with bathing/dressing/bathroom;Assistance with feeding;Direct supervision/assist for medications management;Direct supervision/assist for financial management;Assist for transportation;Help with stairs or ramp for entrance;Supervision due to cognitive status     Functional Status Assessment   Patient has had a recent decline in their functional status and demonstrates the ability to make significant improvements in function in a reasonable and predictable amount of time.     Equipment Recommendations   Other (comment) (defer)      Precautions/Restrictions   Precautions Precautions: Fall Precaution/Restrictions Comments: L chest tube     Mobility Bed Mobility Overal bed mobility: Needs Assistance Bed Mobility: Supine to Sit, Sit to Supine     Supine to sit: Total assist, +2 for  physical assistance, HOB elevated, Used rails, +2 for safety/equipment Sit to supine: Total assist, +2 for physical assistance, +2 for safety/equipment, HOB elevated, Used rails   General bed mobility comments: +2 to scoot to [facility]    Transfers                   General transfer comment: Attempted to facilitate sit>stand from EOB with PT blocking L knee to prevent buckling, assistance to initiate but pt does not participate.      Balance Overall balance assessment: Needs assistance Sitting-balance support: Feet supported Sitting balance-Leahy Scale: Poor Sitting balance - Comments: CGA<>min assist static sitting                                   ADL either performed or assessed with clinical judgement   ADL Overall ADL's : Needs assistance/impaired                                       General ADL Comments: anticipate total A of 2     Vision Patient Visual Report: No change from baseline              Pertinent Vitals/Pain Pain Assessment Pain Assessment: PAINAD Breathing: normal Negative Vocalization: none Facial Expression: facial grimacing Body Language: tense, distressed pacing, fidgeting Consolability: distracted or reassured by voice/touch PAINAD Score: 4     Extremity/Trunk Assessment Upper Extremity Assessment Upper Extremity Assessment: Generalized weakness;LUE deficits/detail LUE Deficits / Details: prior CVA with L weakness, decreased ROM, and increased tone   Lower Extremity Assessment Lower  Extremity Assessment: LLE deficits/detail LLE Deficits / Details: LLE rigid when sitting EOB but able extend L knee with PROM once supine, LLE clonus observed       Communication Communication Communication: Impaired Factors Affecting Communication:  (verbalized one word responses)   Cognition Arousal: Lethargic Behavior During Therapy: Flat affect Cognition: History of cognitive impairments             OT -  Cognition Comments: hx of dementia                 Following commands: Impaired Following commands impaired: Follows one step commands with increased time, Follows one step commands inconsistently     Cueing  General Comments   Cueing Techniques: Verbal cues;Tactile cues;Gestural cues              Home Living Family/patient expects to be discharged to:: Unsure                                 Additional Comments: no family/friends present & pt unable to report      Prior Functioning/Environment Prior Level of Function : Patient poor historian/Family not available                    OT Problem List: Decreased strength;Decreased activity tolerance;Impaired balance (sitting and/or standing);Decreased safety awareness   OT Treatment/Interventions: Self-care/ADL training;Therapeutic exercise;Patient/family education;Balance training;Energy conservation;Therapeutic activities;Cognitive remediation/compensation      OT Goals(Current goals can be found in the care plan section)   Acute Rehab OT Goals Patient Stated Goal: none stated OT Goal Formulation: Patient unable to participate in goal setting Time For Goal Achievement: 12/26/24 Potential to Achieve Goals: Fair ADL Goals Pt Will Perform Grooming: sitting;with min assist Pt Will Perform Lower Body Dressing: with mod assist;sit to/from stand Pt Will Transfer to Toilet: with mod assist;squat pivot transfer Pt Will Perform Toileting - Clothing Manipulation and hygiene: with mod assist;sit to/from stand   OT Frequency:  Min 2X/week    Co-evaluation PT/OT/SLP Co-Evaluation/Treatment: Yes Reason for Co-Treatment: For patient/therapist safety;Necessary to address cognition/behavior during functional activity;To address functional/ADL transfers;Complexity of the patient's impairments (multi-system involvement) PT goals addressed during session: Mobility/safety with mobility;Balance OT goals  addressed during session: ADL's and self-care      AM-PAC OT 6 Clicks Daily Activity     Outcome Measure Help from another person eating meals?: Total Help from another person taking care of personal grooming?: Total Help from another person toileting, which includes using toliet, bedpan, or urinal?: Total Help from another person bathing (including washing, rinsing, drying)?: Total Help from another person to put on and taking off regular upper body clothing?: Total Help from another person to put on and taking off regular lower body clothing?: Total 6 Click Score: 6   End of Session Equipment Utilized During Treatment: Oxygen Nurse Communication: Mobility status  Activity Tolerance: Patient tolerated treatment well Patient left: in bed;with call bell/phone within reach;with bed alarm set  OT Visit Diagnosis: Unsteadiness on feet (R26.81);Repeated falls (R29.6);Muscle weakness (generalized) (M62.81)                Time: 8974-8955 OT Time Calculation (min): 19 min Charges:  OT General Charges $OT Visit: 1 Visit OT Evaluation $OT Eval Moderate Complexity: 1 [address] [place], OTR/L , CBIS ascom [phone]  12/12/24, 12:06 PM
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
